# Patient Record
Sex: Male | Born: 1945 | Race: White | Hispanic: No | Marital: Married | Smoking: Former smoker
Health system: Southern US, Community
[De-identification: ages and names within clinical notes are randomized; demographics above are authoritative.]

## PROBLEM LIST (undated history)

## (undated) DIAGNOSIS — I739 Peripheral vascular disease, unspecified: Secondary | ICD-10-CM

## (undated) DIAGNOSIS — A0472 Enterocolitis due to Clostridium difficile, not specified as recurrent: Secondary | ICD-10-CM

## (undated) DIAGNOSIS — E1151 Type 2 diabetes mellitus with diabetic peripheral angiopathy without gangrene: Secondary | ICD-10-CM

## (undated) DIAGNOSIS — D649 Anemia, unspecified: Secondary | ICD-10-CM

## (undated) DIAGNOSIS — F1010 Alcohol abuse, uncomplicated: Secondary | ICD-10-CM

## (undated) DIAGNOSIS — I219 Acute myocardial infarction, unspecified: Secondary | ICD-10-CM

## (undated) DIAGNOSIS — I639 Cerebral infarction, unspecified: Secondary | ICD-10-CM

## (undated) DIAGNOSIS — F0390 Unspecified dementia without behavioral disturbance: Secondary | ICD-10-CM

## (undated) DIAGNOSIS — M1990 Unspecified osteoarthritis, unspecified site: Secondary | ICD-10-CM

## (undated) HISTORY — DX: Cerebral infarction, unspecified: I63.9

## (undated) HISTORY — DX: Acute myocardial infarction, unspecified: I21.9

## (undated) HISTORY — PX: CORONARY STENT PLACEMENT: SHX1402

## (undated) HISTORY — DX: Anemia, unspecified: D64.9

## (undated) HISTORY — DX: Unspecified osteoarthritis, unspecified site: M19.90

## (undated) HISTORY — PX: BRAIN SURGERY: SHX531

## (undated) HISTORY — DX: Peripheral vascular disease, unspecified: I73.9

## (undated) HISTORY — DX: Type 2 diabetes mellitus with diabetic peripheral angiopathy without gangrene: E11.51

---

## 2013-06-24 HISTORY — PX: COLONOSCOPY: SHX174

## 2016-09-24 ENCOUNTER — Ambulatory Visit
Admission: RE | Admit: 2016-09-24 | Discharge: 2016-09-24 | Disposition: A | Discharge status: Home / Self Care | Payer: Medicare (Managed Care) | Source: Ambulatory Visit | Attending: Internal Medicine | Admitting: Internal Medicine

## 2016-09-24 ENCOUNTER — Other Ambulatory Visit: Payer: Self-pay | Admitting: Internal Medicine

## 2016-09-24 DIAGNOSIS — M25532 Pain in left wrist: Secondary | ICD-10-CM

## 2016-09-26 ENCOUNTER — Other Ambulatory Visit: Payer: Self-pay | Admitting: *Deleted

## 2016-09-26 DIAGNOSIS — I739 Peripheral vascular disease, unspecified: Secondary | ICD-10-CM

## 2016-09-27 ENCOUNTER — Encounter (HOSPITAL_BASED_OUTPATIENT_CLINIC_OR_DEPARTMENT_OTHER): Payer: Medicare (Managed Care) | Attending: Nurse Practitioner

## 2016-09-27 ENCOUNTER — Other Ambulatory Visit (HOSPITAL_COMMUNITY)
Admit: 2016-09-27 | Discharge: 2016-09-27 | Disposition: A | Discharge status: Home / Self Care | Payer: Medicare (Managed Care) | Source: Ambulatory Visit | Attending: Internal Medicine | Admitting: Internal Medicine

## 2016-09-27 DIAGNOSIS — I10 Essential (primary) hypertension: Secondary | ICD-10-CM | POA: Insufficient documentation

## 2016-09-27 DIAGNOSIS — E1151 Type 2 diabetes mellitus with diabetic peripheral angiopathy without gangrene: Secondary | ICD-10-CM | POA: Insufficient documentation

## 2016-09-27 DIAGNOSIS — F0390 Unspecified dementia without behavioral disturbance: Secondary | ICD-10-CM | POA: Insufficient documentation

## 2016-09-27 DIAGNOSIS — L97512 Non-pressure chronic ulcer of other part of right foot with fat layer exposed: Secondary | ICD-10-CM | POA: Insufficient documentation

## 2016-09-27 DIAGNOSIS — E1140 Type 2 diabetes mellitus with diabetic neuropathy, unspecified: Secondary | ICD-10-CM | POA: Diagnosis not present

## 2016-09-27 DIAGNOSIS — E11621 Type 2 diabetes mellitus with foot ulcer: Secondary | ICD-10-CM | POA: Diagnosis not present

## 2016-09-27 DIAGNOSIS — Z794 Long term (current) use of insulin: Secondary | ICD-10-CM | POA: Diagnosis not present

## 2016-09-27 DIAGNOSIS — Z87891 Personal history of nicotine dependence: Secondary | ICD-10-CM | POA: Insufficient documentation

## 2016-09-27 DIAGNOSIS — Z89512 Acquired absence of left leg below knee: Secondary | ICD-10-CM | POA: Insufficient documentation

## 2016-09-27 DIAGNOSIS — Z89431 Acquired absence of right foot: Secondary | ICD-10-CM | POA: Diagnosis not present

## 2016-09-27 DIAGNOSIS — Z8673 Personal history of transient ischemic attack (TIA), and cerebral infarction without residual deficits: Secondary | ICD-10-CM | POA: Diagnosis not present

## 2016-09-27 DIAGNOSIS — I2510 Atherosclerotic heart disease of native coronary artery without angina pectoris: Secondary | ICD-10-CM | POA: Diagnosis not present

## 2016-09-27 DIAGNOSIS — I70201 Unspecified atherosclerosis of native arteries of extremities, right leg: Secondary | ICD-10-CM | POA: Diagnosis not present

## 2016-09-27 DIAGNOSIS — L89893 Pressure ulcer of other site, stage 3: Secondary | ICD-10-CM | POA: Diagnosis not present

## 2016-09-27 DIAGNOSIS — Z955 Presence of coronary angioplasty implant and graft: Secondary | ICD-10-CM | POA: Diagnosis not present

## 2016-09-30 ENCOUNTER — Encounter: Payer: Self-pay | Admitting: Vascular Surgery

## 2016-09-30 LAB — AEROBIC CULTURE  (SUPERFICIAL SPECIMEN)

## 2016-09-30 LAB — AEROBIC CULTURE W GRAM STAIN (SUPERFICIAL SPECIMEN)
Culture: NORMAL
Gram Stain: NONE SEEN

## 2016-10-04 DIAGNOSIS — E11621 Type 2 diabetes mellitus with foot ulcer: Secondary | ICD-10-CM | POA: Diagnosis not present

## 2016-10-09 ENCOUNTER — Encounter: Payer: Self-pay | Admitting: Vascular Surgery

## 2016-10-09 ENCOUNTER — Ambulatory Visit (INDEPENDENT_AMBULATORY_CARE_PROVIDER_SITE_OTHER): Payer: Medicare (Managed Care) | Admitting: Vascular Surgery

## 2016-10-09 ENCOUNTER — Ambulatory Visit (HOSPITAL_COMMUNITY)
Admission: RE | Admit: 2016-10-09 | Discharge: 2016-10-09 | Disposition: A | Discharge status: Home / Self Care | Payer: Medicare (Managed Care) | Source: Ambulatory Visit | Attending: Vascular Surgery | Admitting: Vascular Surgery

## 2016-10-09 VITALS — BP 124/68 | HR 80 | Temp 98.0°F | Resp 28 | Ht 73.0 in | Wt 170.0 lb

## 2016-10-09 DIAGNOSIS — I739 Peripheral vascular disease, unspecified: Secondary | ICD-10-CM

## 2016-10-09 NOTE — Progress Notes (Signed)
Referring Physician: Charissa Bash MD  Patient name: Alan [name] MRN: [id number] DOB: 1945-10-19 Sex: male  REASON FOR CONSULT: Nonhealing wound right foot  HPI: Alan [name] is a 71 y.o. male with known peripheral arterial disease. He previously had a left below-knee amputation done at [facility] approximately 3 months ago. This was done for a nonhealing wound on his left leg. He has also had a previous right transmetatarsal amputation this was several years ago also at [place]. About 3-4 months ago he developed an ulceration on the tip of the transmetatarsal amputation. It has failed to heal despite aggressive local wound care. It is currently being treated with collagenase. He is on aspirin daily. He is not on a statin. The patient denies claudication symptoms but has not really been ambulatory and almost 6 months. He does describe rest pain in the right foot occasionally. He has fairly significant dementia and almost 100% of his care is from his wife. He was able to transfer and walk some on his right leg prior to 6 months ago. The patient's wife denies any prior interventions on his arterial circulation. He is a former smoker quit about 5 months ago. He does have sun downing every evening. Other medical problems include anemia, arthritis, coronary artery disease all of which are stable.  Past Medical History:  Diagnosis Date  . Anemia   . Arthritis   . Diabetes mellitus without complication (HCC)   . Myocardial infarction (HCC)   . Peripheral arterial disease (HCC)   . Stroke [facility])     Past surgical history:  Previous colostomy and colostomy takedown, right transmetatarsal amputation, left below-knee amputation, coronary stenting  Family History  Problem Relation Age of Onset  . Diabetes Mother   . Heart disease Father   . Heart disease Brother     SOCIAL HISTORY: Social History   Social History  . Marital status: Married    Spouse name: N/A  . Number of  children: N/A  . Years of education: N/A   Occupational History  . Not on file.   Social History Main Topics  . Smoking status: Not on file  . Smokeless tobacco: Not on file  . Alcohol use Not on file  . Drug use: Unknown  . Sexual activity: Not on file   Other Topics Concern  . Not on file   Social History Narrative  . No narrative on file  He is a former smoker. He quit 5 months ago.  Allergies  Allergen Reactions  . Penicillins Rash    Current Outpatient Prescriptions  Medication Sig Dispense Refill  . acetaminophen (TYLENOL) 650 MG CR tablet Take 650 mg by mouth every 8 (eight) hours as needed for pain.    Marland Kitchen alendronate (FOSAMAX) 70 MG tablet Take by mouth.    . ALPRAZolam (XANAX) 1 MG tablet TAKE 1 TABLET TWICE DAILY AS NEEDED FOR AGITATION    . aspirin 81 MG EC tablet Take by mouth.    . collagenase (SANTYL) ointment Apply 1 application topically daily.    . ferrous sulfate 325 (65 FE) MG tablet Take by mouth.    . fluticasone (FLONASE) 50 MCG/ACT nasal spray Place into the nose.    . insulin glargine (LANTUS) 100 UNIT/ML injection Inject into the skin.    Marland Kitchen meclizine (ANTIVERT) 25 MG tablet TAKE ONE TABLET (25 MG TOTAL) BY MOUTH 3 (THREE) TIMES A DAY AS NEEDED.    . metFORMIN (GLUCOPHAGE-XR) 750 MG 24 hr tablet TAKE  ONE TAB WITH BREAKFAST AND ONE TAB WITH SUPPER    . sennosides-docusate sodium (SENOKOT-S) 8.6-50 MG tablet Take 1 tablet by mouth daily.    . sertraline (ZOLOFT) 50 MG tablet Take by mouth.    . traZODone (DESYREL) 50 MG tablet TAKE ONE TABLET (50 MG TOTAL) BY MOUTH AT BEDTIME.  2   No current facility-administered medications for this visit.     ROS:   General:  No weight loss, Fever, chills  HEENT: No recent headaches, no nasal bleeding, no visual changes, no sore throat  Neurologic: No dizziness, blackouts, seizures. No recent symptoms of stroke or mini- stroke. No recent episodes of slurred speech, or temporary blindness.  Cardiac: No recent  episodes of chest pain/pressure, no shortness of breath at rest.  + shortness of breath with exertion.  Denies history of atrial fibrillation or irregular heartbeat  Vascular: + history of rest pain in feet.  No history of claudication.  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 He also currently has a broken left wrist.  Hematologic:No history of hypercoagulable state.  No history of easy bleeding.  No history of anemia  Gastrointestinal: No hematochezia or melena,  No gastroesophageal reflux, no trouble swallowing  Urinary:  chronic Kidney disease,  on HD -  MWF or  TTHS,  Burning with urination,  Frequent urination,  Difficulty urinating;   Skin: No rashes  Psychological: No history of anxiety,  No history of depression   Physical Examination  Vitals:   10/09/16 1205  BP: 124/68  Pulse: 80  Resp: (!) 28  Temp: 98 F (36.7 C)  TempSrc: Oral  SpO2: 93%  Weight: 170 lb (77.1 kg)  Height:  (1.854 m)    Body mass index is 22.43 kg/m.  General:  Alert and oriented, no acute distress HEENT: Normal Neck: No bruit or JVD Pulmonary: Clear to auscultation bilaterally Cardiac: Regular Rate and Rhythm without murmur Abdomen: Soft, non-tender, non-distended Skin: No rash, 3 cm less than 1 mm depth ulceration with some granulation tissue proximal front aspect of right transmetatarsal amputation Extremity Pulses:  1+ left difficult to palpate right femoral, absent right dorsalis pedis, posterior tibial pulses  Musculoskeletal: No deformity or edema  Neurologic: Right Upper and bilateral lower extremity motor 5/5 and symmetric  DATA:  Patient had a right leg ABI today which was monophasic and 0.72  ASSESSMENT:  Patient with  peripheral arterial disease now with nonhealing wound on the right foot at risk for limb loss. He overall is fairly debilitated. Had lengthy discussion with the patient and his wife today that we would do an arteriogram lower extremity runoff to see if there is a possible percutaneous intervention. However I do not believe he is a candidate for an open operation if he is not a candidate for percutaneous procedure. Otherwise the plan would be continued wound care with an amputation only if this fails long-term.   PLAN:  See above. The patient will be scheduled for aortogram lower extremity runoff either this Friday or next Friday depending on scheduling the wife will call us back regarding this.   Fabienne Bruns, MD [facility] [place] Office: [phone] Pager: [phone]

## 2016-10-11 ENCOUNTER — Other Ambulatory Visit: Payer: Self-pay

## 2016-10-11 DIAGNOSIS — E11621 Type 2 diabetes mellitus with foot ulcer: Secondary | ICD-10-CM | POA: Diagnosis not present

## 2016-10-18 DIAGNOSIS — E11621 Type 2 diabetes mellitus with foot ulcer: Secondary | ICD-10-CM | POA: Diagnosis not present

## 2016-10-25 ENCOUNTER — Ambulatory Visit (HOSPITAL_COMMUNITY)
Admission: RE | Admit: 2016-10-25 | Discharge: 2016-10-25 | Disposition: A | Discharge status: Home / Self Care | Payer: Medicare (Managed Care) | Source: Ambulatory Visit | Attending: Vascular Surgery | Admitting: Vascular Surgery

## 2016-10-25 ENCOUNTER — Other Ambulatory Visit: Payer: Self-pay

## 2016-10-25 ENCOUNTER — Encounter (HOSPITAL_COMMUNITY)
Admission: RE | Disposition: A | Discharge status: Home / Self Care | Payer: Self-pay | Source: Ambulatory Visit | Attending: Vascular Surgery

## 2016-10-25 DIAGNOSIS — Z7982 Long term (current) use of aspirin: Secondary | ICD-10-CM | POA: Diagnosis not present

## 2016-10-25 DIAGNOSIS — E1151 Type 2 diabetes mellitus with diabetic peripheral angiopathy without gangrene: Secondary | ICD-10-CM | POA: Insufficient documentation

## 2016-10-25 DIAGNOSIS — D649 Anemia, unspecified: Secondary | ICD-10-CM | POA: Insufficient documentation

## 2016-10-25 DIAGNOSIS — Z89512 Acquired absence of left leg below knee: Secondary | ICD-10-CM | POA: Diagnosis not present

## 2016-10-25 DIAGNOSIS — Z8673 Personal history of transient ischemic attack (TIA), and cerebral infarction without residual deficits: Secondary | ICD-10-CM | POA: Insufficient documentation

## 2016-10-25 DIAGNOSIS — Z955 Presence of coronary angioplasty implant and graft: Secondary | ICD-10-CM | POA: Insufficient documentation

## 2016-10-25 DIAGNOSIS — I252 Old myocardial infarction: Secondary | ICD-10-CM | POA: Insufficient documentation

## 2016-10-25 DIAGNOSIS — Z89431 Acquired absence of right foot: Secondary | ICD-10-CM | POA: Diagnosis not present

## 2016-10-25 DIAGNOSIS — Z8249 Family history of ischemic heart disease and other diseases of the circulatory system: Secondary | ICD-10-CM | POA: Diagnosis not present

## 2016-10-25 DIAGNOSIS — Z880 Allergy status to penicillin: Secondary | ICD-10-CM | POA: Diagnosis not present

## 2016-10-25 DIAGNOSIS — Z794 Long term (current) use of insulin: Secondary | ICD-10-CM | POA: Insufficient documentation

## 2016-10-25 DIAGNOSIS — I70235 Atherosclerosis of native arteries of right leg with ulceration of other part of foot: Secondary | ICD-10-CM | POA: Insufficient documentation

## 2016-10-25 DIAGNOSIS — Z87891 Personal history of nicotine dependence: Secondary | ICD-10-CM | POA: Insufficient documentation

## 2016-10-25 DIAGNOSIS — L97519 Non-pressure chronic ulcer of other part of right foot with unspecified severity: Secondary | ICD-10-CM | POA: Insufficient documentation

## 2016-10-25 DIAGNOSIS — I2510 Atherosclerotic heart disease of native coronary artery without angina pectoris: Secondary | ICD-10-CM | POA: Insufficient documentation

## 2016-10-25 DIAGNOSIS — Z7951 Long term (current) use of inhaled steroids: Secondary | ICD-10-CM | POA: Insufficient documentation

## 2016-10-25 DIAGNOSIS — F0390 Unspecified dementia without behavioral disturbance: Secondary | ICD-10-CM | POA: Insufficient documentation

## 2016-10-25 DIAGNOSIS — I70234 Atherosclerosis of native arteries of right leg with ulceration of heel and midfoot: Secondary | ICD-10-CM | POA: Diagnosis not present

## 2016-10-25 DIAGNOSIS — M1990 Unspecified osteoarthritis, unspecified site: Secondary | ICD-10-CM | POA: Insufficient documentation

## 2016-10-25 HISTORY — PX: LOWER EXTREMITY ANGIOGRAPHY: CATH118251

## 2016-10-25 HISTORY — PX: ABDOMINAL AORTOGRAM: CATH118222

## 2016-10-25 LAB — POCT I-STAT, CHEM 8
BUN: 10 mg/dL (ref 6–20)
CHLORIDE: 100 mmol/L — AB (ref 101–111)
Calcium, Ion: 1.15 mmol/L (ref 1.15–1.40)
Creatinine, Ser: 0.5 mg/dL — ABNORMAL LOW (ref 0.61–1.24)
GLUCOSE: 198 mg/dL — AB (ref 65–99)
HEMATOCRIT: 37 % — AB (ref 39.0–52.0)
HEMOGLOBIN: 12.6 g/dL — AB (ref 13.0–17.0)
POTASSIUM: 4.2 mmol/L (ref 3.5–5.1)
Sodium: 140 mmol/L (ref 135–145)
TCO2: 31 mmol/L (ref 0–100)

## 2016-10-25 LAB — GLUCOSE, CAPILLARY
GLUCOSE-CAPILLARY: 214 mg/dL — AB (ref 65–99)
GLUCOSE-CAPILLARY: 110 mg/dL — AB (ref 65–99)

## 2016-10-25 SURGERY — LOWER EXTREMITY ANGIOGRAPHY
Anesthesia: LOCAL | Laterality: Right

## 2016-10-25 MED ORDER — FENTANYL CITRATE (PF) 100 MCG/2ML IJ SOLN
INTRAMUSCULAR | Status: DC | PRN
  Administered 2016-10-25: 25 ug via INTRAVENOUS

## 2016-10-25 MED ORDER — LIDOCAINE HCL (PF) 1 % IJ SOLN
INTRAMUSCULAR | Status: DC | PRN
  Administered 2016-10-25: 15 mL

## 2016-10-25 MED ORDER — HEPARIN (PORCINE) IN NACL 2-0.9 UNIT/ML-% IJ SOLN
INTRAMUSCULAR | Status: DC | PRN
  Administered 2016-10-25: 1000 mL

## 2016-10-25 MED ORDER — IODIXANOL 320 MG/ML IV SOLN
INTRAVENOUS | Status: DC | PRN
  Administered 2016-10-25: 120 mL via INTRAVENOUS

## 2016-10-25 MED ORDER — SODIUM CHLORIDE 0.9 % IV SOLN
INTRAVENOUS | Status: DC
  Administered 2016-10-25: 08:00:00 via INTRAVENOUS

## 2016-10-25 MED ORDER — MIDAZOLAM HCL 2 MG/2ML IJ SOLN
INTRAMUSCULAR | Status: DC | PRN
  Administered 2016-10-25: 0.5 mg via INTRAVENOUS

## 2016-10-25 MED ORDER — FENTANYL CITRATE (PF) 100 MCG/2ML IJ SOLN
INTRAMUSCULAR | Status: AC
  Filled 2016-10-25: qty 2

## 2016-10-25 MED ORDER — MIDAZOLAM HCL 2 MG/2ML IJ SOLN
INTRAMUSCULAR | Status: AC
  Filled 2016-10-25: qty 2

## 2016-10-25 MED ORDER — SODIUM CHLORIDE 0.45 % IV SOLN
INTRAVENOUS | Status: DC
  Administered 2016-10-25: 12:00:00 via INTRAVENOUS

## 2016-10-25 MED ORDER — LIDOCAINE HCL 1 % IJ SOLN
INTRAMUSCULAR | Status: AC
  Filled 2016-10-25: qty 20

## 2016-10-25 SURGICAL SUPPLY — 7 items
CATH OMNI FLUSH 5F 65CM (CATHETERS) ×3 IMPLANT
KIT PV (KITS) ×3 IMPLANT
SHEATH PINNACLE 5F 10CM (SHEATH) ×3 IMPLANT
SYR MEDRAD MARK V 150ML (SYRINGE) ×3 IMPLANT
TRANSDUCER W/STOPCOCK (MISCELLANEOUS) ×3 IMPLANT
TRAY PV CATH (CUSTOM PROCEDURE TRAY) ×3 IMPLANT
WIRE HITORQ VERSACORE ST 145CM (WIRE) ×3 IMPLANT

## 2016-10-25 NOTE — Interval H&P Note (Signed)
History and Physical Interval Note:  10/25/2016 11:05 AM  Alan [name]  has presented today for surgery, with the diagnosis of pvd with nonhealing wound on right foot  The various methods of treatment have been discussed with the patient and family. After consideration of risks, benefits and other options for treatment, the patient has consented to  Procedure(s): Lower Extremity Angiography (Right) Abdominal Aortogram (N/A) as a surgical intervention .  The patient's history has been reviewed, patient examined, no change in status, stable for surgery.  I have reviewed the patient's chart and labs.  Questions were answered to the patient's satisfaction.     Fabienne BrunsFields, Maryssa Giampietro

## 2016-10-25 NOTE — Discharge Instructions (Signed)
HOLD METFORMIN FOR 48 HRS.  MAY RESUME MON MORNING.   Angiogram, Care After This sheet gives you information about how to care for yourself after your procedure. Your doctor may also give you more specific instructions. If you have problems or questions, contact your doctor. Follow these instructions at home: Insertion site care   Follow instructions from your doctor about how to take care of your long, thin tube (catheter) insertion area. Make sure you:  Wash your hands with soap and water before you change your bandage (dressing). If you cannot use soap and water, use hand sanitizer.  Change your bandage as told by your doctor.  Leave stitches (sutures), skin glue, or skin tape (adhesive) strips in place. They may need to stay in place for 2 weeks or longer. If tape strips get loose and curl up, you may trim the loose edges. Do not remove tape strips completely unless your doctor says it is okay.  Do not take baths, swim, or use a hot tub until your doctor says it is okay.  You may shower 24-48 hours after the procedure or as told by your doctor.  Gently wash the area with plain soap and water.  Pat the area dry with a clean towel.  Do not rub the area. This may cause bleeding.  Do not apply powder or lotion to the area. Keep the area clean and dry.  Check your insertion area every day for signs of infection. Check for:  More redness, swelling, or pain.  Fluid or blood.  Warmth.  Pus or a bad smell. Activity   Rest as told by your doctor, usually for 1-2 days.  Do not lift anything that is heavier than 10 lbs. (4.5 kg) or as told by your doctor.  Do not drive for 24 hours if you were given a medicine to help you relax (sedative).  Do not drive or use heavy machinery while taking prescription pain medicine. General instructions   Go back to your normal activities as told by your doctor, usually in about a week. Ask your doctor what activities are safe for  you.  If the insertion area starts to bleed, lie flat and put pressure on the area. If the bleeding does not stop, get help right away. This is an emergency.  Drink enough fluid to keep your pee (urine) clear or pale yellow.  Take over-the-counter and prescription medicines only as told by your doctor.  Keep all follow-up visits as told by your doctor. This is important. Contact a doctor if:  You have a fever.  You have chills.  You have more redness, swelling, or pain around your insertion area.  You have fluid or blood coming from your insertion area.  The insertion area feels warm to the touch.  You have pus or a bad smell coming from your insertion area.  You have more bruising around the insertion area.  Blood collects in the tissue around the insertion area (hematoma) that may be painful to the touch. Get help right away if:  You have a lot of pain in the insertion area.  The insertion area swells very fast.  The insertion area is bleeding, and the bleeding does not stop after holding steady pressure on the area.  The area near or just beyond the insertion area becomes pale, cool, tingly, or numb. These symptoms may be an emergency. Do not wait to see if the symptoms will go away. Get medical help right away. Call  your local emergency services (911 in the [place] Do not drive yourself to the hospital. Summary  After the procedure, it is common to have bruising and tenderness at the long, thin tube insertion area.  After the procedure, it is important to rest and drink plenty of fluids.  Do not take baths, swim, or use a hot tub until your doctor says it is okay to do so. You may shower 24-48 hours after the procedure or as told by your doctor.  If the insertion area starts to bleed, lie flat and put pressure on the area. If the bleeding does not stop, get help right away. This is an emergency. This information is not intended to replace advice given to you by your  health care provider. Make sure you discuss any questions you have with your health care provider. Document Released: 09/06/2008 Document Revised: 06/04/2016 Document Reviewed: 06/04/2016 Elsevier Interactive Patient Education  2017 [employer]

## 2016-10-25 NOTE — Op Note (Signed)
Procedure: Abdominal aortogram with bilateral lower extremity runoff  Preoperative diagnosis: Nonhealing wound right foot.  Postoperative diagnosis: Same  Anesthesia: Local with IV sedation  Operative findings: #1 subtotal occlusion right common femoral artery, one vessel runoff peroneal artery patent right superficial femoral-popliteal artery  Operative details: After obtaining informed consent, patient taken PV lab. The patient was placed in supine position the Angio table. Both groins were prepped and draped in usual sterile fashion. Local anesthesia was treated of the left common femoral artery. Using ultrasound guidance the left common femoral artery was successfully cannulated and 035 versacore wire threaded up in the abdominal aorta under fluoroscopic guidance. Next a 5 French sheath was placed over the guidewire and the left common femoral artery. This was thoroughly flushed with heparin saline. I [place] Omni Flush catheter was advanced over the guidewire into the abdominal aorta and abdominal aortogram obtained in AP projection. The left and right renal arteries are patent. The infrarenal abdominal aorta is patent. The left and right common external and internal iliac arteries are patent. Next the Omni Flush catheter was pulled down just above the aortic bifurcation and a pelvic Angiogram was obtained which confirmed the above findings. At this point bilateral lower extremity runoff views were obtained through the Omni Flush catheter.  In the right lower extremity, the right common femoral artery has a subtotal occlusion with calcific plaque. The profunda and superficial femoral arteries are patent. The right popliteal artery is patent. Anterior tibial and posterior tibial arteries are occluded. There is one-vessel runoff via the peroneal artery to the right foot.  In the left lower extremity, there is a pre-existing left below-knee amputation.. The left common femoral and superficial femoral  artery is patent. The left profunda femoris artery is patent. At this point the Omni Flush catheter was pulled back over a guidewire and the 5 [place] sheath thoroughly flushed with heparin saline. The 5 French sheath was then removed and hemostasis obtained with direct pressure.  Operative management: The patient will be scheduled for a right common femoral endarterectomy next week.  Alan Brunsharles Drenda Sobecki, MD [facility] [place] Office: [phone] Pager: [phone]

## 2016-10-25 NOTE — H&P (View-Only) (Signed)
  Referring Physician: Julie Williams MD  Patient name: Alan [name] MRN: [id number] DOB: 12/30/1945 Sex: male  REASON FOR CONSULT: Nonhealing wound right foot  HPI: Alan [name] is a 70 y.o. male with known peripheral arterial disease. He previously had a left below-knee amputation done at [facility] approximately 3 months ago. This was done for a nonhealing wound on his left leg. He has also had a previous right transmetatarsal amputation this was several years ago also at [facility]. About 3-4 months ago he developed an ulceration on the tip of the transmetatarsal amputation. It has failed to heal despite aggressive local wound care. It is currently being treated with collagenase. He is on aspirin daily. He is not on a statin. The patient denies claudication symptoms but has not really been ambulatory and almost 6 months. He does describe rest pain in the right foot occasionally. He has fairly significant dementia and almost 100% of his care is from his wife. He was able to transfer and walk some on his right leg prior to 6 months ago. The patient's wife denies any prior interventions on his arterial circulation. He is a former smoker quit about 5 months ago. He does have sun downing every evening. Other medical problems include anemia, arthritis, coronary artery disease all of which are stable.  Past Medical History:  Diagnosis Date  . Anemia   . Arthritis   . Diabetes mellitus without complication (HCC)   . Myocardial infarction (HCC)   . Peripheral arterial disease (HCC)   . Stroke (HCC)     Past surgical history:  Previous colostomy and colostomy takedown, right transmetatarsal amputation, left below-knee amputation, coronary stenting  Family History  Problem Relation Age of Onset  . Diabetes Mother   . Heart disease Father   . Heart disease Brother     SOCIAL HISTORY: Social History   Social History  . Marital status: Married    Spouse name: N/A  . Number of  children: N/A  . Years of education: N/A   Occupational History  . Not on file.   Social History Main Topics  . Smoking status: Not on file  . Smokeless tobacco: Not on file  . Alcohol use Not on file  . Drug use: Unknown  . Sexual activity: Not on file   Other Topics Concern  . Not on file   Social History Narrative  . No narrative on file  He is a former smoker. He quit 5 months ago.  Allergies  Allergen Reactions  . Penicillins Rash    Current Outpatient Prescriptions  Medication Sig Dispense Refill  . acetaminophen (TYLENOL) 650 MG CR tablet Take 650 mg by mouth every 8 (eight) hours as needed for pain.    . alendronate (FOSAMAX) 70 MG tablet Take by mouth.    . ALPRAZolam (XANAX) 1 MG tablet TAKE 1 TABLET TWICE DAILY AS NEEDED FOR AGITATION    . aspirin 81 MG EC tablet Take by mouth.    . collagenase (SANTYL) ointment Apply 1 application topically daily.    . ferrous sulfate 325 (65 FE) MG tablet Take by mouth.    . fluticasone (FLONASE) 50 MCG/ACT nasal spray Place into the nose.    . insulin glargine (LANTUS) 100 UNIT/ML injection Inject into the skin.    . meclizine (ANTIVERT) 25 MG tablet TAKE ONE TABLET (25 MG TOTAL) BY MOUTH 3 (THREE) TIMES A DAY AS NEEDED.    . metFORMIN (GLUCOPHAGE-XR) 750 MG 24 hr tablet TAKE   ONE TAB WITH BREAKFAST AND ONE TAB WITH SUPPER    . sennosides-docusate sodium (SENOKOT-S) 8.6-50 MG tablet Take 1 tablet by mouth daily.    . sertraline (ZOLOFT) 50 MG tablet Take by mouth.    . traZODone (DESYREL) 50 MG tablet TAKE ONE TABLET (50 MG TOTAL) BY MOUTH AT BEDTIME.  2   No current facility-administered medications for this visit.     ROS:   General:  No weight loss, Fever, chills  HEENT: No recent headaches, no nasal bleeding, no visual changes, no sore throat  Neurologic: No dizziness, blackouts, seizures. No recent symptoms of stroke or mini- stroke. No recent episodes of slurred speech, or temporary blindness.  Cardiac: No recent  episodes of chest pain/pressure, no shortness of breath at rest.  + shortness of breath with exertion.  Denies history of atrial fibrillation or irregular heartbeat  Vascular: + history of rest pain in feet.  No history of claudication.  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 He also currently has a broken left wrist.  Hematologic:No history of hypercoagulable state.  No history of easy bleeding.  No history of anemia  Gastrointestinal: No hematochezia or melena,  No gastroesophageal reflux, no trouble swallowing  Urinary: [ ]  chronic Kidney disease, [ ]  on HD - [ ]  MWF or [ ]  TTHS, [ ]  Burning with urination, [ ]  Frequent urination, [ ]  Difficulty urinating;   Skin: No rashes  Psychological: No history of anxiety,  No history of depression   Physical Examination  Vitals:   10/09/16 1205  BP: 124/68  Pulse: 80  Resp: (!) 28  Temp: 98 F (36.7 C)  TempSrc: Oral  SpO2: 93%  Weight: 170 lb (77.1 kg)  Height: 6\' 1"  (1.854 m)    Body mass index is 22.43 kg/m.  General:  Alert and oriented, no acute distress HEENT: Normal Neck: No bruit or JVD Pulmonary: Clear to auscultation bilaterally Cardiac: Regular Rate and Rhythm without murmur Abdomen: Soft, non-tender, non-distended Skin: No rash, 3 cm less than 1 mm depth ulceration with some granulation tissue proximal front aspect of right transmetatarsal amputation Extremity Pulses:  1+ left difficult to palpate right femoral, absent right dorsalis pedis, posterior tibial pulses  Musculoskeletal: No deformity or edema  Neurologic: Right Upper and bilateral lower extremity motor 5/5 and symmetric  DATA:  Patient had a right leg ABI today which was monophasic and 0.72  ASSESSMENT:  Patient with  peripheral arterial disease now with nonhealing wound on the right foot at risk for limb loss. He overall is fairly debilitated. Had lengthy discussion with the patient and his wife today that we would do an arteriogram lower extremity runoff to see if there is a possible percutaneous intervention. However I do not believe he is a candidate for an open operation if he is not a candidate for percutaneous procedure. Otherwise the plan would be continued wound care with an amputation only if this fails long-term.   PLAN:  See above. The patient will be scheduled for aortogram lower extremity runoff either this Friday or next Friday depending on scheduling the wife will call us back regarding this.   Fabienne Brunsharles Fields, MD [facility] [place] Office: [phone] Pager: [phone]

## 2016-10-28 ENCOUNTER — Encounter (HOSPITAL_COMMUNITY): Payer: Self-pay | Admitting: Vascular Surgery

## 2016-10-29 ENCOUNTER — Telehealth: Payer: Self-pay | Admitting: Vascular Surgery

## 2016-10-29 ENCOUNTER — Encounter (HOSPITAL_COMMUNITY): Payer: Self-pay | Admitting: *Deleted

## 2016-10-29 NOTE — Telephone Encounter (Signed)
Spoke w/ Celine MansSonja at [facility]. I forwarded her call to the scheduling nurse for more specific instructions regarding the procedure. awt

## 2016-10-29 NOTE — Telephone Encounter (Signed)
-----   Message from Sharee PimpleMarilyn K McChesney, RN sent at 10/29/2016  9:38 AM EDT ----- Regarding: RE: Appointment Question Contact: [phone] He is scheduled for surgery tomorrow with Dr. Darrick PennaFields. ----- Message ----- From: Shari Prowsobin, Annette W Sent: 10/29/2016   9:11 AM To: [facility] Clinical Pool Subject: Appointment Question                           Celine MansSonja from [facility] called yesterday to schedule a post op appointment for this patient. I do see in his chart that he had a procedure on 10/25/16 but I don't see any instructions in the discharge summary or a staff message indicating when he should be seen again. The d/c note did mention he would be scheduled for another procedure this week so maybe that is why she is calling.  Please advise. Thanks J. C. Penneynnette

## 2016-10-29 NOTE — Telephone Encounter (Signed)
-----   Message from Phillips Odorarol S Pullins, RN sent at 10/29/2016  9:41 AM EDT ----- Regarding: RE: Appointment Question Contact: [phone] This pt. Is scheduled for right femoral endarterectomy and right foot debridement tomorrow, 5/9.  I left a message with Sonja @ [facility] re: pt. [facility].   ----- Message ----- From: Shari Prowsobin, Annette W Sent: 10/29/2016   9:11 AM To: [facility] Clinical Pool Subject: Appointment Question                           Celine MansSonja from [facility] called yesterday to schedule a post op appointment for this patient. I do see in his chart that he had a procedure on 10/25/16 but I don't see any instructions in the discharge summary or a staff message indicating when he should be seen again. The d/c note did mention he would be scheduled for another procedure this week so maybe that is why she is calling.  Please advise. Thanks J. C. Penneynnette

## 2016-10-29 NOTE — Telephone Encounter (Signed)
I left message for [facility] Inconja @ [facility] regarding this patient's procedure R femoral endarterectomy and R foot debrid[facility] and patient needs to arrive at 9am. I also asked Sonja to call our scheduling nurses back if she had any further questions. awt

## 2016-10-29 NOTE — Progress Notes (Signed)
Pt gave verbal consent for spouse, Lyla SonCarrie, to complete SDW-Pre-op call. Spouse denies that pt C/O SOB and chest pain. Spouse denies that pt is under the care of a cardiologist. Spouse stated that pt had an echo > 10 years ago and a stress test > 15 years ago. Spouse stated that pt had " one heart stent put in 25 years ago."  Spouse stated that MD advised that pt take half dose Lantus insulin tonight and no Metformin DOS. Spouse made aware to have pt check BG every 2 hours prior to arrival, treat a BG< 70 with 4 glucose tabs, wait 15 minutes after taking tabs to recheck BG, if BG remains < 70 call SS unit. Spouse made aware to have pt stop taking otc vitamins, fish oil and herbal medications. Do not take any NSAIDs ie: Ibuprofen, Advil, Naproxen ,BC and Goody Powder. Spouse verbalized understanding of all pre-op instructions.

## 2016-10-30 ENCOUNTER — Inpatient Hospital Stay (HOSPITAL_COMMUNITY): Payer: Medicare (Managed Care) | Admitting: Certified Registered Nurse Anesthetist

## 2016-10-30 ENCOUNTER — Encounter (HOSPITAL_COMMUNITY)
Admission: RE | Disposition: A | Discharge status: Home / Self Care | Payer: Self-pay | Source: Ambulatory Visit | Attending: Vascular Surgery

## 2016-10-30 ENCOUNTER — Encounter (HOSPITAL_COMMUNITY): Payer: Self-pay | Admitting: *Deleted

## 2016-10-30 ENCOUNTER — Inpatient Hospital Stay (HOSPITAL_COMMUNITY)
Admission: RE | Admit: 2016-10-30 | Discharge: 2016-10-31 | DRG: 254 | Disposition: A | Discharge status: Home / Self Care | Payer: Medicare (Managed Care) | Source: Ambulatory Visit | Attending: Vascular Surgery | Admitting: Vascular Surgery

## 2016-10-30 DIAGNOSIS — F0390 Unspecified dementia without behavioral disturbance: Secondary | ICD-10-CM | POA: Diagnosis present

## 2016-10-30 DIAGNOSIS — Z7983 Long term (current) use of bisphosphonates: Secondary | ICD-10-CM

## 2016-10-30 DIAGNOSIS — Z8673 Personal history of transient ischemic attack (TIA), and cerebral infarction without residual deficits: Secondary | ICD-10-CM

## 2016-10-30 DIAGNOSIS — I70234 Atherosclerosis of native arteries of right leg with ulceration of heel and midfoot: Secondary | ICD-10-CM | POA: Diagnosis not present

## 2016-10-30 DIAGNOSIS — Z89512 Acquired absence of left leg below knee: Secondary | ICD-10-CM | POA: Diagnosis not present

## 2016-10-30 DIAGNOSIS — Z87891 Personal history of nicotine dependence: Secondary | ICD-10-CM

## 2016-10-30 DIAGNOSIS — Z955 Presence of coronary angioplasty implant and graft: Secondary | ICD-10-CM | POA: Diagnosis not present

## 2016-10-30 DIAGNOSIS — Z794 Long term (current) use of insulin: Secondary | ICD-10-CM

## 2016-10-30 DIAGNOSIS — Z7951 Long term (current) use of inhaled steroids: Secondary | ICD-10-CM

## 2016-10-30 DIAGNOSIS — Z8249 Family history of ischemic heart disease and other diseases of the circulatory system: Secondary | ICD-10-CM

## 2016-10-30 DIAGNOSIS — M1990 Unspecified osteoarthritis, unspecified site: Secondary | ICD-10-CM | POA: Diagnosis present

## 2016-10-30 DIAGNOSIS — I70209 Unspecified atherosclerosis of native arteries of extremities, unspecified extremity: Secondary | ICD-10-CM | POA: Diagnosis present

## 2016-10-30 DIAGNOSIS — Z7982 Long term (current) use of aspirin: Secondary | ICD-10-CM

## 2016-10-30 DIAGNOSIS — E1151 Type 2 diabetes mellitus with diabetic peripheral angiopathy without gangrene: Secondary | ICD-10-CM | POA: Diagnosis present

## 2016-10-30 DIAGNOSIS — Z833 Family history of diabetes mellitus: Secondary | ICD-10-CM

## 2016-10-30 DIAGNOSIS — I252 Old myocardial infarction: Secondary | ICD-10-CM | POA: Diagnosis not present

## 2016-10-30 DIAGNOSIS — M79671 Pain in right foot: Secondary | ICD-10-CM | POA: Diagnosis present

## 2016-10-30 DIAGNOSIS — I739 Peripheral vascular disease, unspecified: Secondary | ICD-10-CM

## 2016-10-30 HISTORY — DX: Alcohol abuse, uncomplicated: F10.10

## 2016-10-30 HISTORY — PX: ENDARTERECTOMY FEMORAL: SHX5804

## 2016-10-30 HISTORY — DX: Unspecified dementia, unspecified severity, without behavioral disturbance, psychotic disturbance, mood disturbance, and anxiety: F03.90

## 2016-10-30 LAB — COMPREHENSIVE METABOLIC PANEL
ALT: 19 U/L (ref 17–63)
AST: 19 U/L (ref 15–41)
Albumin: 3.5 g/dL (ref 3.5–5.0)
Alkaline Phosphatase: 76 U/L (ref 38–126)
Anion gap: 9 (ref 5–15)
BUN: 13 mg/dL (ref 6–20)
CO2: 26 mmol/L (ref 22–32)
Calcium: 8.9 mg/dL (ref 8.9–10.3)
Chloride: 102 mmol/L (ref 101–111)
Creatinine, Ser: 0.7 mg/dL (ref 0.61–1.24)
GFR calc Af Amer: >60 mL/min (ref >60)
GFR calc non Af Amer: >60 mL/min (ref >60)
Glucose, Bld: 266 mg/dL — ABNORMAL HIGH (ref 65–99)
Potassium: 4.7 mmol/L (ref 3.5–5.1)
Sodium: 137 mmol/L (ref 135–145)
Total Bilirubin: 0.3 mg/dL (ref 0.3–1.2)
Total Protein: 6.9 g/dL (ref 6.5–8.1)

## 2016-10-30 LAB — GLUCOSE, CAPILLARY
GLUCOSE-CAPILLARY: 200 mg/dL — AB (ref 65–99)
Glucose-Capillary: 270 mg/dL — ABNORMAL HIGH (ref 65–99)
Glucose-Capillary: 255 mg/dL — ABNORMAL HIGH (ref 65–99)
Glucose-Capillary: 224 mg/dL — ABNORMAL HIGH (ref 65–99)
Glucose-Capillary: 195 mg/dL — ABNORMAL HIGH (ref 65–99)

## 2016-10-30 LAB — URINALYSIS, ROUTINE W REFLEX MICROSCOPIC
Bilirubin Urine: NEGATIVE
Glucose, UA: 50 mg/dL — AB
Hgb urine dipstick: NEGATIVE
Ketones, ur: NEGATIVE mg/dL
Leukocytes, UA: NEGATIVE
Nitrite: NEGATIVE
Protein, ur: NEGATIVE mg/dL
Specific Gravity, Urine: 1.016 (ref 1.005–1.030)
pH: 6 (ref 5.0–8.0)

## 2016-10-30 LAB — CBC
HCT: 36.5 % — ABNORMAL LOW (ref 39.0–52.0)
Hemoglobin: 11.8 g/dL — ABNORMAL LOW (ref 13.0–17.0)
MCH: 28.6 pg (ref 26.0–34.0)
MCHC: 32.3 g/dL (ref 30.0–36.0)
MCV: 88.6 fL (ref 78.0–100.0)
Platelets: 184 10*3/uL (ref 150–400)
RBC: 4.12 MIL/uL — ABNORMAL LOW (ref 4.22–5.81)
RDW: 14.1 % (ref 11.5–15.5)
WBC: 8.2 10*3/uL (ref 4.0–10.5)

## 2016-10-30 LAB — SURGICAL PCR SCREEN
MRSA, PCR: POSITIVE — AB
STAPHYLOCOCCUS AUREUS: POSITIVE — AB

## 2016-10-30 LAB — TYPE AND SCREEN
ABO/RH(D): O POS
ANTIBODY SCREEN: NEGATIVE

## 2016-10-30 LAB — PROTIME-INR
INR: 1.03
Prothrombin Time: 13.5 seconds (ref 11.4–15.2)

## 2016-10-30 LAB — APTT: aPTT: 28 seconds (ref 24–36)

## 2016-10-30 LAB — ABO/RH: ABO/RH(D): O POS

## 2016-10-30 SURGERY — ENDARTERECTOMY, FEMORAL
Anesthesia: General | Site: Groin | Laterality: Right

## 2016-10-30 MED ORDER — ONDANSETRON HCL 4 MG/2ML IJ SOLN
INTRAMUSCULAR | Status: DC | PRN
  Administered 2016-10-30: 4 mg via INTRAVENOUS

## 2016-10-30 MED ORDER — ONDANSETRON HCL 4 MG/2ML IJ SOLN
INTRAMUSCULAR | Status: AC
  Filled 2016-10-30: qty 4

## 2016-10-30 MED ORDER — TRAZODONE HCL 50 MG PO TABS: 50.0000 mg | ORAL_TABLET | Freq: Every evening | ORAL | Status: DC | PRN

## 2016-10-30 MED ORDER — FENTANYL CITRATE (PF) 100 MCG/2ML IJ SOLN
INTRAMUSCULAR | Status: DC | PRN
  Administered 2016-10-30: 50 ug via INTRAVENOUS
  Administered 2016-10-30: 100 ug via INTRAVENOUS
  Administered 2016-10-30: 150 ug via INTRAVENOUS

## 2016-10-30 MED ORDER — SERTRALINE HCL 50 MG PO TABS
50.0000 mg | ORAL_TABLET | Freq: Two times a day (BID) | ORAL | Status: DC
  Administered 2016-10-30 – 2016-10-31 (×2): 50 mg via ORAL
  Filled 2016-10-30 (×2): qty 1

## 2016-10-30 MED ORDER — MORPHINE SULFATE (PF) 4 MG/ML IV SOLN
2.0000 mg | INTRAVENOUS | Status: DC | PRN
  Administered 2016-10-30: 2 mg via INTRAVENOUS
  Filled 2016-10-30: qty 1

## 2016-10-30 MED ORDER — PHENYLEPHRINE 40 MCG/ML (10ML) SYRINGE FOR IV PUSH (FOR BLOOD PRESSURE SUPPORT)
PREFILLED_SYRINGE | INTRAVENOUS | Status: AC
  Filled 2016-10-30: qty 20

## 2016-10-30 MED ORDER — ASPIRIN EC 81 MG PO TBEC
81.0000 mg | DELAYED_RELEASE_TABLET | Freq: Every day | ORAL | Status: DC
  Administered 2016-10-30 – 2016-10-31 (×2): 81 mg via ORAL
  Filled 2016-10-30 (×2): qty 1

## 2016-10-30 MED ORDER — PHENYLEPHRINE HCL 10 MG/ML IJ SOLN
INTRAMUSCULAR | Status: DC | PRN
  Administered 2016-10-30 (×4): 80 ug via INTRAVENOUS
  Administered 2016-10-30: 40 ug via INTRAVENOUS

## 2016-10-30 MED ORDER — CHLORHEXIDINE GLUCONATE 4 % EX LIQD: 60.0000 mL | Freq: Once | CUTANEOUS | Status: DC

## 2016-10-30 MED ORDER — HYDROMORPHONE HCL 1 MG/ML IJ SOLN: 0.2500 mg | INTRAMUSCULAR | Status: DC | PRN

## 2016-10-30 MED ORDER — ACETAMINOPHEN 325 MG PO TABS: 325.0000 mg | ORAL_TABLET | ORAL | Status: DC | PRN

## 2016-10-30 MED ORDER — ROCURONIUM BROMIDE 100 MG/10ML IV SOLN
INTRAVENOUS | Status: DC | PRN
  Administered 2016-10-30: 50 mg via INTRAVENOUS

## 2016-10-30 MED ORDER — PROPOFOL 10 MG/ML IV BOLUS
INTRAVENOUS | Status: DC | PRN
  Administered 2016-10-30: 130 mg via INTRAVENOUS

## 2016-10-30 MED ORDER — VANCOMYCIN HCL IN DEXTROSE 1-5 GM/200ML-% IV SOLN
1000.0000 mg | Freq: Two times a day (BID) | INTRAVENOUS | Status: AC
  Administered 2016-10-30 – 2016-10-31 (×2): 1000 mg via INTRAVENOUS
  Filled 2016-10-30 (×2): qty 200

## 2016-10-30 MED ORDER — INSULIN ASPART 100 UNIT/ML ~~LOC~~ SOLN
0.0000 [IU] | Freq: Three times a day (TID) | SUBCUTANEOUS | Status: DC
Start: 2016-10-31 — End: 2016-10-31

## 2016-10-30 MED ORDER — HEPARIN SODIUM (PORCINE) 1000 UNIT/ML IJ SOLN
INTRAMUSCULAR | Status: DC | PRN
  Administered 2016-10-30: 8000 [IU] via INTRAVENOUS

## 2016-10-30 MED ORDER — 0.9 % SODIUM CHLORIDE (POUR BTL) OPTIME
TOPICAL | Status: DC | PRN
  Administered 2016-10-30: 2000 mL

## 2016-10-30 MED ORDER — EPHEDRINE 5 MG/ML INJ
INTRAVENOUS | Status: AC
  Filled 2016-10-30: qty 10

## 2016-10-30 MED ORDER — THROMBIN 20000 UNITS EX SOLR
CUTANEOUS | Status: AC
  Filled 2016-10-30: qty 20000

## 2016-10-30 MED ORDER — LEVOFLOXACIN 750 MG PO TABS
750.0000 mg | ORAL_TABLET | Freq: Every day | ORAL | Status: DC
  Administered 2016-10-31: 750 mg via ORAL
  Filled 2016-10-30: qty 1

## 2016-10-30 MED ORDER — ALUM & MAG HYDROXIDE-SIMETH 200-200-20 MG/5ML PO SUSP: 15.0000 mL | ORAL | Status: DC | PRN

## 2016-10-30 MED ORDER — ONDANSETRON HCL 4 MG/2ML IJ SOLN: 4.0000 mg | Freq: Once | INTRAMUSCULAR | Status: DC | PRN

## 2016-10-30 MED ORDER — INSULIN GLARGINE 100 UNIT/ML ~~LOC~~ SOLN
38.0000 [IU] | Freq: Every day | SUBCUTANEOUS | Status: DC
  Administered 2016-10-30: 38 [IU] via SUBCUTANEOUS
  Filled 2016-10-30 (×2): qty 0.38

## 2016-10-30 MED ORDER — POTASSIUM CHLORIDE CRYS ER 20 MEQ PO TBCR: 20.0000 meq | EXTENDED_RELEASE_TABLET | Freq: Every day | ORAL | Status: DC | PRN

## 2016-10-30 MED ORDER — PHENOL 1.4 % MT LIQD: 1.0000 | OROMUCOSAL | Status: DC | PRN

## 2016-10-30 MED ORDER — SODIUM CHLORIDE 0.9 % IV SOLN: 500.0000 mL | Freq: Once | INTRAVENOUS | Status: DC | PRN

## 2016-10-30 MED ORDER — SODIUM CHLORIDE 0.9 % IV SOLN: INTRAVENOUS | Status: DC

## 2016-10-30 MED ORDER — HYDRALAZINE HCL 20 MG/ML IJ SOLN
5.0000 mg | INTRAMUSCULAR | Status: DC | PRN
Start: 2016-10-30 — End: 2016-10-31

## 2016-10-30 MED ORDER — MUPIROCIN 2 % EX OINT
1.0000 "application " | TOPICAL_OINTMENT | Freq: Once | CUTANEOUS | Status: AC
  Administered 2016-10-30: 1 via TOPICAL
  Filled 2016-10-30: qty 22

## 2016-10-30 MED ORDER — ALPRAZOLAM 0.5 MG PO TABS
1.0000 mg | ORAL_TABLET | Freq: Two times a day (BID) | ORAL | Status: DC | PRN
  Administered 2016-10-30: 1 mg via ORAL
  Filled 2016-10-30: qty 2

## 2016-10-30 MED ORDER — GUAIFENESIN-DM 100-10 MG/5ML PO SYRP: 15.0000 mL | ORAL_SOLUTION | ORAL | Status: DC | PRN

## 2016-10-30 MED ORDER — MEPERIDINE HCL 25 MG/ML IJ SOLN: 6.2500 mg | INTRAMUSCULAR | Status: DC | PRN

## 2016-10-30 MED ORDER — SODIUM CHLORIDE 0.9 % IV SOLN
INTRAVENOUS | Status: DC | PRN
  Administered 2016-10-30: 14:00:00

## 2016-10-30 MED ORDER — DOCUSATE SODIUM 100 MG PO CAPS
100.0000 mg | ORAL_CAPSULE | Freq: Every day | ORAL | Status: DC
Start: 2016-10-31 — End: 2016-10-31
  Administered 2016-10-31: 100 mg via ORAL
  Filled 2016-10-30: qty 1

## 2016-10-30 MED ORDER — LABETALOL HCL 5 MG/ML IV SOLN: 10.0000 mg | INTRAVENOUS | Status: DC | PRN

## 2016-10-30 MED ORDER — PROPOFOL 10 MG/ML IV BOLUS
INTRAVENOUS | Status: AC
  Filled 2016-10-30: qty 20

## 2016-10-30 MED ORDER — ALENDRONATE SODIUM 70 MG PO TABS: 70.0000 mg | ORAL_TABLET | ORAL | Status: DC

## 2016-10-30 MED ORDER — PANTOPRAZOLE SODIUM 40 MG PO TBEC
40.0000 mg | DELAYED_RELEASE_TABLET | Freq: Every day | ORAL | Status: DC
  Administered 2016-10-30 – 2016-10-31 (×2): 40 mg via ORAL
  Filled 2016-10-30 (×2): qty 1

## 2016-10-30 MED ORDER — MIDAZOLAM HCL 2 MG/2ML IJ SOLN
INTRAMUSCULAR | Status: AC
  Filled 2016-10-30: qty 2

## 2016-10-30 MED ORDER — METOPROLOL TARTRATE 5 MG/5ML IV SOLN: 2.0000 mg | INTRAVENOUS | Status: DC | PRN

## 2016-10-30 MED ORDER — SENNOSIDES-DOCUSATE SODIUM 8.6-50 MG PO TABS: 1.0000 | ORAL_TABLET | Freq: Every day | ORAL | Status: DC | PRN

## 2016-10-30 MED ORDER — FENTANYL CITRATE (PF) 250 MCG/5ML IJ SOLN
INTRAMUSCULAR | Status: AC
  Filled 2016-10-30: qty 5

## 2016-10-30 MED ORDER — SODIUM CHLORIDE 0.9 % IV SOLN
INTRAVENOUS | Status: DC
  Administered 2016-10-30: 20:00:00 via INTRAVENOUS

## 2016-10-30 MED ORDER — ROCURONIUM BROMIDE 10 MG/ML (PF) SYRINGE
PREFILLED_SYRINGE | INTRAVENOUS | Status: AC
  Filled 2016-10-30: qty 10

## 2016-10-30 MED ORDER — LIDOCAINE 2% (20 MG/ML) 5 ML SYRINGE
INTRAMUSCULAR | Status: AC
  Filled 2016-10-30: qty 10

## 2016-10-30 MED ORDER — ACETAMINOPHEN 325 MG PO TABS
650.0000 mg | ORAL_TABLET | Freq: Three times a day (TID) | ORAL | Status: DC
  Administered 2016-10-30 – 2016-10-31 (×2): 650 mg via ORAL
  Filled 2016-10-30 (×2): qty 2

## 2016-10-30 MED ORDER — ONDANSETRON HCL 4 MG/2ML IJ SOLN: 4.0000 mg | Freq: Four times a day (QID) | INTRAMUSCULAR | Status: DC | PRN

## 2016-10-30 MED ORDER — ACETAMINOPHEN 650 MG RE SUPP: 325.0000 mg | RECTAL | Status: DC | PRN

## 2016-10-30 MED ORDER — VANCOMYCIN HCL IN DEXTROSE 1-5 GM/200ML-% IV SOLN
INTRAVENOUS | Status: AC
  Filled 2016-10-30: qty 200

## 2016-10-30 MED ORDER — LIDOCAINE HCL (CARDIAC) 20 MG/ML IV SOLN
INTRAVENOUS | Status: DC | PRN
  Administered 2016-10-30: 100 mg via INTRAVENOUS

## 2016-10-30 MED ORDER — METFORMIN HCL ER 750 MG PO TB24
750.0000 mg | ORAL_TABLET | Freq: Every day | ORAL | Status: DC
  Administered 2016-10-31: 750 mg via ORAL
  Filled 2016-10-30: qty 1

## 2016-10-30 MED ORDER — FLUTICASONE PROPIONATE 50 MCG/ACT NA SUSP: 1.0000 | Freq: Every day | NASAL | Status: DC | PRN

## 2016-10-30 MED ORDER — MIDAZOLAM HCL 5 MG/5ML IJ SOLN
INTRAMUSCULAR | Status: DC | PRN
  Administered 2016-10-30: 2 mg via INTRAVENOUS

## 2016-10-30 MED ORDER — ATORVASTATIN CALCIUM 10 MG PO TABS
10.0000 mg | ORAL_TABLET | Freq: Every evening | ORAL | Status: DC
  Administered 2016-10-30: 10 mg via ORAL
  Filled 2016-10-30: qty 1

## 2016-10-30 MED ORDER — SUGAMMADEX SODIUM 200 MG/2ML IV SOLN
INTRAVENOUS | Status: DC | PRN
  Administered 2016-10-30: 200 mg via INTRAVENOUS

## 2016-10-30 MED ORDER — PROTAMINE SULFATE 10 MG/ML IV SOLN
INTRAVENOUS | Status: DC | PRN
  Administered 2016-10-30: 30 mg via INTRAVENOUS
  Administered 2016-10-30: 20 mg via INTRAVENOUS

## 2016-10-30 MED ORDER — TAMSULOSIN HCL 0.4 MG PO CAPS
0.4000 mg | ORAL_CAPSULE | Freq: Every evening | ORAL | Status: DC
  Administered 2016-10-30: 0.4 mg via ORAL
  Filled 2016-10-30: qty 1

## 2016-10-30 MED ORDER — ENOXAPARIN SODIUM 30 MG/0.3ML ~~LOC~~ SOLN: 30.0000 mg | SUBCUTANEOUS | Status: DC

## 2016-10-30 MED ORDER — LACTATED RINGERS IV SOLN
INTRAVENOUS | Status: DC
  Administered 2016-10-30 (×2): via INTRAVENOUS

## 2016-10-30 MED ORDER — VANCOMYCIN HCL IN DEXTROSE 1-5 GM/200ML-% IV SOLN
1000.0000 mg | INTRAVENOUS | Status: AC
  Administered 2016-10-30: 1000 mg via INTRAVENOUS

## 2016-10-30 MED ORDER — OXYCODONE-ACETAMINOPHEN 5-325 MG PO TABS
1.0000 | ORAL_TABLET | ORAL | Status: DC | PRN
  Administered 2016-10-30 – 2016-10-31 (×3): 2 via ORAL
  Filled 2016-10-30 (×3): qty 2

## 2016-10-30 MED ORDER — FERROUS SULFATE 325 (65 FE) MG PO TABS: 325.0000 mg | ORAL_TABLET | Freq: Every day | ORAL | Status: DC

## 2016-10-30 MED ORDER — MAGNESIUM SULFATE 2 GM/50ML IV SOLN: 2.0000 g | Freq: Every day | INTRAVENOUS | Status: DC | PRN

## 2016-10-30 SURGICAL SUPPLY — 49 items
BANDAGE ACE 4X5 VEL STRL LF (GAUZE/BANDAGES/DRESSINGS) IMPLANT
BANDAGE ACE 6X5 VEL STRL LF (GAUZE/BANDAGES/DRESSINGS) IMPLANT
BNDG GAUZE ELAST 4 BULKY (GAUZE/BANDAGES/DRESSINGS) ×3 IMPLANT
CANISTER SUCT 3000ML PPV (MISCELLANEOUS) ×3 IMPLANT
CANNULA VESSEL 3MM 2 BLNT TIP (CANNULA) ×3 IMPLANT
CLIP TI MEDIUM 24 (CLIP) ×3 IMPLANT
CLIP TI WIDE RED SMALL 24 (CLIP) ×3 IMPLANT
COVER SURGICAL LIGHT HANDLE (MISCELLANEOUS) ×3 IMPLANT
DERMABOND ADVANCED (GAUZE/BANDAGES/DRESSINGS) ×1
DERMABOND ADVANCED .7 DNX12 (GAUZE/BANDAGES/DRESSINGS) ×2 IMPLANT
DRAIN SNY WOU (WOUND CARE) IMPLANT
ELECT REM PT RETURN 9FT ADLT (ELECTROSURGICAL) ×3
ELECTRODE REM PT RTRN 9FT ADLT (ELECTROSURGICAL) ×2 IMPLANT
EVACUATOR SILICONE 100CC (DRAIN) IMPLANT
GAUZE SPONGE 4X4 12PLY STRL (GAUZE/BANDAGES/DRESSINGS) ×3 IMPLANT
GAUZE XEROFORM 1X8 LF (GAUZE/BANDAGES/DRESSINGS) ×3 IMPLANT
GLOVE BIO SURGEON STRL SZ7.5 (GLOVE) ×3 IMPLANT
GLOVE BIOGEL PI IND STRL 6.5 (GLOVE) ×2 IMPLANT
GLOVE BIOGEL PI IND STRL 7.5 (GLOVE) ×2 IMPLANT
GLOVE BIOGEL PI INDICATOR 6.5 (GLOVE) ×1
GLOVE BIOGEL PI INDICATOR 7.5 (GLOVE) ×1
GLOVE ECLIPSE 7.0 STRL STRAW (GLOVE) ×3 IMPLANT
GLOVE SS N UNI LF 6.0 STRL (GLOVE) ×3 IMPLANT
GOWN STRL REUS W/ TWL LRG LVL3 (GOWN DISPOSABLE) ×6 IMPLANT
GOWN STRL REUS W/TWL LRG LVL3 (GOWN DISPOSABLE) ×3
HEMOSTAT SPONGE AVITENE ULTRA (HEMOSTASIS) IMPLANT
KIT BASIN OR (CUSTOM PROCEDURE TRAY) ×3 IMPLANT
KIT ROOM TURNOVER OR (KITS) ×3 IMPLANT
NS IRRIG 1000ML POUR BTL (IV SOLUTION) ×6 IMPLANT
PACK GENERAL/GYN (CUSTOM PROCEDURE TRAY) IMPLANT
PACK PERIPHERAL VASCULAR (CUSTOM PROCEDURE TRAY) ×3 IMPLANT
PACK UNIVERSAL I (CUSTOM PROCEDURE TRAY) IMPLANT
PAD ARMBOARD 7.5X6 YLW CONV (MISCELLANEOUS) ×6 IMPLANT
PATCH HEMASHIELD 8X75 (Vascular Products) ×3 IMPLANT
STAPLER VISISTAT 35W (STAPLE) IMPLANT
SUT ETHILON 3 0 PS 1 (SUTURE) IMPLANT
SUT PROLENE 5 0 C 1 24 (SUTURE) ×6 IMPLANT
SUT PROLENE 6 0 CC (SUTURE) ×6 IMPLANT
SUT PROLENE 7 0 BV1 MDA (SUTURE) ×6 IMPLANT
SUT VIC AB 2-0 CTX 36 (SUTURE) IMPLANT
SUT VIC AB 2-0 SH 27 (SUTURE) ×1
SUT VIC AB 2-0 SH 27XBRD (SUTURE) ×2 IMPLANT
SUT VIC AB 3-0 SH 27 (SUTURE) ×2
SUT VIC AB 3-0 SH 27X BRD (SUTURE) ×4 IMPLANT
SUT VIC AB 4-0 PS2 27 (SUTURE) ×3 IMPLANT
SUT VICRYL 4-0 PS2 18IN ABS (SUTURE) IMPLANT
TRAY FOLEY W/METER SILVER 16FR (SET/KITS/TRAYS/PACK) ×3 IMPLANT
UNDERPAD 30X30 (UNDERPADS AND DIAPERS) ×3 IMPLANT
WATER STERILE IRR 1000ML POUR (IV SOLUTION) ×3 IMPLANT

## 2016-10-30 NOTE — Progress Notes (Addendum)
Multi small raised some red some with pus rash like areas noted right arm, under arm and one red dry are noted above umbilicus . Instructed pt wife to let the Dr know so he could assess areas. Called OR and Dr Darrick PennaFields informed.

## 2016-10-30 NOTE — Anesthesia Procedure Notes (Signed)
Procedure Name: Intubation Date/Time: 10/30/2016 2:17 PM Performed by: Reine JustFLOWERS, Froilan Mclean T Pre-anesthesia Checklist: Patient identified, Emergency Drugs available, Suction available, Patient being monitored and Timeout performed Patient Re-evaluated:Patient Re-evaluated prior to inductionOxygen Delivery Method: Circle system utilized and Simple face mask Preoxygenation: Pre-oxygenation with 100% oxygen Intubation Type: IV induction Ventilation: Mask ventilation without difficulty and Oral airway inserted - appropriate to patient size Laryngoscope Size: Miller and 3 Grade View: Grade I Tube type: Oral Tube size: 7.5 mm Number of attempts: 1 Airway Equipment and Method: Patient positioned with wedge pillow and Stylet Placement Confirmation: ETT inserted through vocal cords under direct vision,  positive ETCO2 and breath sounds checked- equal and bilateral Secured at: 22 cm Tube secured with: Tape Dental Injury: Teeth and Oropharynx as per pre-operative assessment

## 2016-10-30 NOTE — Transfer of Care (Signed)
Immediate Anesthesia Transfer of Care Note  Patient: Alan SeedsGlenn A Benjamin Jr.  Procedure(s) Performed: Procedure(s): ENDARTERECTOMY FEMORAL-RIGHT WITH PATCH GRAFT (Right)  Patient Location: PACU  Anesthesia Type:General  Level of Consciousness: awake, alert  and oriented  Airway & Oxygen Therapy: Patient Spontanous Breathing and Patient connected to nasal cannula oxygen  Post-op Assessment: Report given to RN and Post -op Vital signs reviewed and stable  Post vital signs: Reviewed and stable  Last Vitals:  Vitals:   10/30/16 0940  BP: (!) 108/59  Pulse: 70  Resp: 20  Temp: 36.9 C    Last Pain:  Vitals:   10/30/16 0940  TempSrc: Oral         Complications: No apparent anesthesia complications

## 2016-10-30 NOTE — Anesthesia Postprocedure Evaluation (Signed)
Anesthesia Post Note  Patient: Alan SeedsGlenn A Schiltz Jr.  Procedure(s) Performed: Procedure(s) (LRB): ENDARTERECTOMY FEMORAL-RIGHT WITH PATCH GRAFT (Right)  Patient location during evaluation: PACU Anesthesia Type: General Level of consciousness: awake and alert Pain management: pain level controlled Vital Signs Assessment: post-procedure vital signs reviewed and stable Respiratory status: spontaneous breathing, nonlabored ventilation, respiratory function stable and patient connected to nasal cannula oxygen Cardiovascular status: blood pressure returned to baseline and stable Postop Assessment: no signs of nausea or vomiting Anesthetic complications: no       Last Vitals:  Vitals:   10/30/16 1750 10/30/16 1805  BP: 129/77 (!) 148/74  Pulse: 70 69  Resp: 13 13  Temp:      Last Pain:  Vitals:   10/30/16 0940  TempSrc: Oral                 Emmamarie Kluender DAVID

## 2016-10-30 NOTE — Op Note (Signed)
Procedure: Right common femoral endarterectomy  Preoperative diagnosis: Nonhealing wound right leg  postoperative diagnosis: Same  Anesthesia: Gen.  Assistant: Lianne CureMaureen  Collins PA-C  Operative findings: #1 Severe calcific atherosclerotic plaque right common femoral artery                                 #2 Dacron patch left common femoral artery   Operative details: After obtaining informed consent, the patient was taken to operating room. The patient was placed in supine position operating table. After induction of general anesthesia and endotracheal intubation, the patient's entire right lower extremity was prepped and draped in usual sterile fashion. A longitudinal incision was made in the right groin care down the saphenous tissues down level left common femoral artery. This was severely calcified. Dissection was carried down to level the femoral bifurcation. The superficial femoral artery was dissected free circumferentially as well as the profunda femoris artery. Both these had vessel loops placed around them. Dissection was then carried out the level of the inguinal ligament. The common femoral artery was still heavily calcified in this area. Therefore I dissected up underneath the inguinal ligament ligating several venous tributaries crossing branches with silk ties. I dissected out the circumflex iliac branches at the level of the inguinal ligament and then placed a vessel loop around the distal external iliac artery as well. The distal external iliac artery was soft and character and clampable. At this point patient was given 8,000 units of intravenous heparin. A longitudinal opening was made in the common femoral artery. There is a large amount of calcific plaque nearly occluding the common femoral artery. Found a suitable plane to begin an endarterectomy in the common femoral endarterectomy was carried proximally up into the distal external iliac artery. A plug of plaque was removed creating  a flush proximal endpoint. I then carried the endarterectomy distally down to the femoral bifurcation. A good endpoint was obtained in the proximal profunda but there was a slight stepoff at the bifurcation which I tacked with several 7 0 prolene sutures.   There was good backbleeding from the profunda and superficial femoral arteries. There was excellent arterial inflow. A Dacron patch was then brought up in the operative field and sewn on as a patch angioplasty after removing all loose debris from the artery. Just prior completion anastomosis everything was forebled backbled and thoroughly flushed. Anastomosis was secured clamps released there was pulsatile flow in the SFA and profunda immediately.  Hemostasis was obtained with protamine as well as direct pressure. The groin was then closed in multiple layers of running 2 0 3-0 Vicryl suture followed by 4 0 Vicryl subcuticular stitch in the skin and Dermabond on the skin. Patient tolerated the procedure well and there were no complications.  The instrument sponge and needle count was correct at the end of the case. The patient was taken to recovery room in stable condition.  Fabienne Brunsharles Fields, MD [facility] Office: [phone] Pager: [phone]

## 2016-10-30 NOTE — Anesthesia Preprocedure Evaluation (Addendum)
Anesthesia Evaluation  Patient identified by MRN, date of birth, ID band Patient awake    Reviewed: Allergy & Precautions, NPO status , Patient's Chart, lab work & pertinent test results  Airway Mallampati: I  TM Distance: >3 FB Neck ROM: Full    Dental  (+) Dental Advisory Given   Pulmonary former smoker,    Pulmonary exam normal        Cardiovascular + Past MI  Normal cardiovascular exam     Neuro/Psych CVA    GI/Hepatic   Endo/Other  diabetes, Type 2, Insulin Dependent, Oral Hypoglycemic Agents  Renal/GU      Musculoskeletal   Abdominal   Peds  Hematology   Anesthesia Other Findings   Reproductive/Obstetrics                            Anesthesia Physical Anesthesia Plan  ASA: III  Anesthesia Plan: General   Post-op Pain Management:    Induction: Intravenous  Airway Management Planned: Oral ETT  Additional Equipment:   Intra-op Plan:   Post-operative Plan: Extubation in OR  Informed Consent: I have reviewed the patients History and Physical, chart, labs and discussed the procedure including the risks, benefits and alternatives for the proposed anesthesia with the patient or authorized representative who has indicated his/her understanding and acceptance.     Plan Discussed with: CRNA and Surgeon  Anesthesia Plan Comments:         Anesthesia Quick Evaluation

## 2016-10-30 NOTE — Interval H&P Note (Signed)
History and Physical Interval Note:  10/30/2016 1:27 PM  Alan SeedsGlenn A Alanis Jr.  has presented today for surgery, with the diagnosis of Peripheral Vascular Disease with nonhealing wound right foot  I70.234  The various methods of treatment have been discussed with the patient and family. After consideration of risks, benefits and other options for treatment, the patient has consented to  Procedure(s): ENDARTERECTOMY FEMORAL-RIGHT (Right) IRRIGATION AND DEBRIDEMENT EXTREMITY-RIGHT FOOT (Right) as a surgical intervention .  The patient's history has been reviewed, patient examined, no change in status, stable for surgery.  I have reviewed the patient's chart and labs.  Questions were answered to the patient's satisfaction.    Pt has some small pustules on his forearm that look like insect bites.  I do not believe these should affect operation.  Right foot wound is clean and granulating so we will not debride this today.   Fabienne BrunsFields, Maree Ainley

## 2016-10-30 NOTE — H&P (View-Only) (Signed)
  Referring Physician: Julie Williams MD  Patient name: Alan [name] MRN: [id number] DOB: 07/05/1945 Sex: male  REASON FOR CONSULT: Nonhealing wound right foot  HPI: Alan [name] is a 71 y.o. male with known peripheral arterial disease. He previously had a left below-knee amputation done at [facility] approximately 3 months ago. This was done for a nonhealing wound on his left leg. He has also had a previous right transmetatarsal amputation this was several years ago also at [facility]. About 3-4 months ago he developed an ulceration on the tip of the transmetatarsal amputation. It has failed to heal despite aggressive local wound care. It is currently being treated with collagenase. He is on aspirin daily. He is not on a statin. The patient denies claudication symptoms but has not really been ambulatory and almost 6 months. He does describe rest pain in the right foot occasionally. He has fairly significant dementia and almost 100% of his care is from his wife. He was able to transfer and walk some on his right leg prior to 6 months ago. The patient's wife denies any prior interventions on his arterial circulation. He is a former smoker quit about 5 months ago. He does have sun downing every evening. Other medical problems include anemia, arthritis, coronary artery disease all of which are stable.  Past Medical History:  Diagnosis Date  . Anemia   . Arthritis   . Diabetes mellitus without complication (HCC)   . Myocardial infarction (HCC)   . Peripheral arterial disease (HCC)   . Stroke (HCC)     Past surgical history:  Previous colostomy and colostomy takedown, right transmetatarsal amputation, left below-knee amputation, coronary stenting  Family History  Problem Relation Age of Onset  . Diabetes Mother   . Heart disease Father   . Heart disease Brother     SOCIAL HISTORY: Social History   Social History  . Marital status: Married    Spouse name: N/A  . Number of  children: N/A  . Years of education: N/A   Occupational History  . Not on file.   Social History Main Topics  . Smoking status: Not on file  . Smokeless tobacco: Not on file  . Alcohol use Not on file  . Drug use: Unknown  . Sexual activity: Not on file   Other Topics Concern  . Not on file   Social History Narrative  . No narrative on file  He is a former smoker. He quit 5 months ago.  Allergies  Allergen Reactions  . Penicillins Rash    Current Outpatient Prescriptions  Medication Sig Dispense Refill  . acetaminophen (TYLENOL) 650 MG CR tablet Take 650 mg by mouth every 8 (eight) hours as needed for pain.    . alendronate (FOSAMAX) 70 MG tablet Take by mouth.    . ALPRAZolam (XANAX) 1 MG tablet TAKE 1 TABLET TWICE DAILY AS NEEDED FOR AGITATION    . aspirin 81 MG EC tablet Take by mouth.    . collagenase (SANTYL) ointment Apply 1 application topically daily.    . ferrous sulfate 325 (65 FE) MG tablet Take by mouth.    . fluticasone (FLONASE) 50 MCG/ACT nasal spray Place into the nose.    . insulin glargine (LANTUS) 100 UNIT/ML injection Inject into the skin.    . meclizine (ANTIVERT) 25 MG tablet TAKE ONE TABLET (25 MG TOTAL) BY MOUTH 3 (THREE) TIMES A DAY AS NEEDED.    . metFORMIN (GLUCOPHAGE-XR) 750 MG 24 hr tablet TAKE   ONE TAB WITH BREAKFAST AND ONE TAB WITH SUPPER    . sennosides-docusate sodium (SENOKOT-S) 8.6-50 MG tablet Take 1 tablet by mouth daily.    . sertraline (ZOLOFT) 50 MG tablet Take by mouth.    . traZODone (DESYREL) 50 MG tablet TAKE ONE TABLET (50 MG TOTAL) BY MOUTH AT BEDTIME.  2   No current facility-administered medications for this visit.     ROS:   General:  No weight loss, Fever, chills  HEENT: No recent headaches, no nasal bleeding, no visual changes, no sore throat  Neurologic: No dizziness, blackouts, seizures. No recent symptoms of stroke or mini- stroke. No recent episodes of slurred speech, or temporary blindness.  Cardiac: No recent  episodes of chest pain/pressure, no shortness of breath at rest.  + shortness of breath with exertion.  Denies history of atrial fibrillation or irregular heartbeat  Vascular: + history of rest pain in feet.  No history of claudication.  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 He also currently has a broken left wrist.  Hematologic:No history of hypercoagulable state.  No history of easy bleeding.  No history of anemia  Gastrointestinal: No hematochezia or melena,  No gastroesophageal reflux, no trouble swallowing  Urinary: [ ]  chronic Kidney disease, [ ]  on HD - [ ]  MWF or [ ]  TTHS, [ ]  Burning with urination, [ ]  Frequent urination, [ ]  Difficulty urinating;   Skin: No rashes  Psychological: No history of anxiety,  No history of depression   Physical Examination  Vitals:   10/09/16 1205  BP: 124/68  Pulse: 80  Resp: (!) 28  Temp: 98 F (36.7 C)  TempSrc: Oral  SpO2: 93%  Weight: 170 lb (77.1 kg)  Height: 6\' 1"  (1.854 m)    Body mass index is 22.43 kg/m.  General:  Alert and oriented, no acute distress HEENT: Normal Neck: No bruit or JVD Pulmonary: Clear to auscultation bilaterally Cardiac: Regular Rate and Rhythm without murmur Abdomen: Soft, non-tender, non-distended Skin: No rash, 3 cm less than 1 mm depth ulceration with some granulation tissue proximal front aspect of right transmetatarsal amputation Extremity Pulses:  1+ left difficult to palpate right femoral, absent right dorsalis pedis, posterior tibial pulses  Musculoskeletal: No deformity or edema  Neurologic: Right Upper and bilateral lower extremity motor 5/5 and symmetric  DATA:  Patient had a right leg ABI today which was monophasic and 0.72  ASSESSMENT:  Patient with  peripheral arterial disease now with nonhealing wound on the right foot at risk for limb loss. He overall is fairly debilitated. Had lengthy discussion with the patient and his wife today that we would do an arteriogram lower extremity runoff to see if there is a possible percutaneous intervention. However I do not believe he is a candidate for an open operation if he is not a candidate for percutaneous procedure. Otherwise the plan would be continued wound care with an amputation only if this fails long-term.   PLAN:  See above. The patient will be scheduled for aortogram lower extremity runoff either this Friday or next Friday depending on scheduling the wife will call us back regarding this.   Fabienne Brunsharles Fields, MD [facility] [place] Office: [phone] Pager: [phone]

## 2016-10-31 ENCOUNTER — Inpatient Hospital Stay (HOSPITAL_COMMUNITY): Payer: Medicare (Managed Care)

## 2016-10-31 ENCOUNTER — Encounter (HOSPITAL_COMMUNITY): Payer: Self-pay | Admitting: Vascular Surgery

## 2016-10-31 DIAGNOSIS — I739 Peripheral vascular disease, unspecified: Secondary | ICD-10-CM

## 2016-10-31 LAB — CBC
HCT: 32.2 % — ABNORMAL LOW (ref 39.0–52.0)
Hemoglobin: 10.3 g/dL — ABNORMAL LOW (ref 13.0–17.0)
MCH: 28.3 pg (ref 26.0–34.0)
MCHC: 32 g/dL (ref 30.0–36.0)
MCV: 88.5 fL (ref 78.0–100.0)
PLATELETS: 164 10*3/uL (ref 150–400)
RBC: 3.64 MIL/uL — AB (ref 4.22–5.81)
RDW: 13.5 % (ref 11.5–15.5)
WBC: 7.4 10*3/uL (ref 4.0–10.5)

## 2016-10-31 LAB — BASIC METABOLIC PANEL
Anion gap: 8 (ref 5–15)
BUN: 11 mg/dL (ref 6–20)
CHLORIDE: 101 mmol/L (ref 101–111)
CO2: 27 mmol/L (ref 22–32)
Calcium: 8.5 mg/dL — ABNORMAL LOW (ref 8.9–10.3)
Creatinine, Ser: 0.64 mg/dL (ref 0.61–1.24)
GFR calc non Af Amer: >60 mL/min (ref >60)
Glucose, Bld: 181 mg/dL — ABNORMAL HIGH (ref 65–99)
POTASSIUM: 4.1 mmol/L (ref 3.5–5.1)
Sodium: 136 mmol/L (ref 135–145)

## 2016-10-31 LAB — HEMOGLOBIN A1C
Hgb A1c MFr Bld: 9.5 % — ABNORMAL HIGH (ref 4.8–5.6)
Mean Plasma Glucose: 226 mg/dL

## 2016-10-31 LAB — GLUCOSE, CAPILLARY: Glucose-Capillary: 111 mg/dL — ABNORMAL HIGH (ref 65–99)

## 2016-10-31 MED ORDER — CHLORHEXIDINE GLUCONATE CLOTH 2 % EX PADS
6.0000 | MEDICATED_PAD | Freq: Every day | CUTANEOUS | Status: DC
  Administered 2016-10-31: 6 via TOPICAL

## 2016-10-31 MED ORDER — OXYCODONE-ACETAMINOPHEN 5-325 MG PO TABS
1.0000 | ORAL_TABLET | Freq: Four times a day (QID) | ORAL | 0 refills | Status: DC | PRN
Start: 2016-10-31 — End: 2017-06-23

## 2016-10-31 MED ORDER — MUPIROCIN 2 % EX OINT
1.0000 "application " | TOPICAL_OINTMENT | Freq: Two times a day (BID) | CUTANEOUS | Status: DC
  Administered 2016-10-31: 1 via NASAL
  Filled 2016-10-31: qty 22

## 2016-10-31 MED ORDER — LEVOFLOXACIN 750 MG PO TABS: 750.0000 mg | ORAL_TABLET | Freq: Every day | ORAL | 0 refills | Status: DC

## 2016-10-31 NOTE — Evaluation (Signed)
Physical Therapy Evaluation Patient Details Name: Alan [name]. MRN: [id number] DOB: [birth year] Today's Date: 10/31/2016   History of Present Illness  Pt is a 71 y.o. male s/p Right Endarterectomy Femoral. 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  Clinical Impression  Patient presents with limited mobility mainly due to pain.  At baseline requires assist for transfers, but currently max A and wife with painful L shoulder.  Feel hoyer lift for home indicated and [place] aide.  Currently receiving [facility] services and continues to be appropriate for that.  Will sign off due to planned d/c home today.    Follow Up Recommendations Other (comment) ([place] aide; [facility])    Equipment Recommendations  Other (comment) (hoyer lift)    Recommendations for Other Services       Precautions / Restrictions Precautions Precautions: Fall Other Brace/Splint: L wrist splint Restrictions Weight Bearing Restrictions: No Other Position/Activity Restrictions: Wife reports L wrist fx and NWB on RLE; L BKA      Mobility  Bed Mobility Overal bed mobility: Needs Assistance Bed Mobility: Rolling;Supine to Sit Rolling: Min assist   Supine to sit: Mod assist     General bed mobility comments: able to move legs off bed and initiate coming upright, difficulty so assisted with trunk, but pt using L UE despite wife reporting supposed NWB due to nonhealing wrist fx  Transfers Overall transfer level: Needs assistance   Transfers: Squat Pivot Transfers     Squat pivot transfers: Max assist     General transfer comment: wife provided lifting and lowering assistance to get bed to w/c  Ambulation/Gait                Stairs            Wheelchair Mobility    Modified Rankin (Stroke Patients Only)       Balance Overall balance assessment: Needs assistance   Sitting balance-Leahy Scale: Poor Sitting  balance - Comments: leaning back on bed with sunken middle, UE support for balance                                     Pertinent Vitals/Pain Pain Assessment: 0-10 Pain Score: 7  Faces Pain Scale: Hurts even more Pain Location: Groing and R foot Pain Descriptors / Indicators: Guarding;Sore Pain Intervention(s): Monitored during session;Limited activity within patient's tolerance    Home Living Family/patient expects to be discharged to:: Private residence Living Arrangements: Spouse/significant other Available Help at Discharge: Family;Available 24 hours/day Type of Home: House Home Access: Ramped entrance     Home Layout: One level Home Equipment: Walker - 2 wheels;Bedside commode;Tub bench;Wheelchair - Education administrator (comment);Hospital bed (slide board, overhead trapeze and gait belt) Additional Comments: Currently remodeling bathroom and house for handicap accessibility. [facility] attends [facility] 3x per [facility] currently; has option to increase to 5x per week if desired. [place] aide 3x per week; working on getting new aide for additional assist.    Prior Function Level of Independence: Needs assistance   Gait / Transfers Assistance Needed: Limited to transfers with assist of wife. Wife reports "on good days" pt able to perform slide board transfers with assist. On bad days; wife performs squat pivot transfer.  ADL's / Homemaking Assistance Needed: Wife assisting with all ADL; including self feeding at times.  Hand Dominance   Dominant Hand: Right    Extremity/Trunk Assessment   Upper Extremity Assessment Upper Extremity Assessment: Defer to OT evaluation LUE Deficits / Details: Limited assessment due to possible wrist fx and shoulder pain.  LUE: Unable to fully assess due to pain;Unable to fully assess due to immobilization    Lower Extremity Assessment Lower Extremity Assessment: RLE deficits/detail;LLE deficits/detail RLE Deficits / Details: bandage around foot,  noted groin wound while changing brief; able to move unaided, but painful so MMT not performed LLE Deficits / Details: BKA, moves unrestricted    Cervical / Trunk Assessment Cervical / Trunk Assessment: Kyphotic  Communication   Communication: No difficulties  Cognition Arousal/Alertness: Awake/alert Behavior During Therapy: WFL for tasks assessed/performed;Flat affect Overall Cognitive Status: History of cognitive impairments - at baseline                                        General Comments General comments (skin integrity, edema, etc.): wife feeding pt in bed, assisted with clothing in bed and EOB and with transfer    Exercises     Assessment/Plan    PT Assessment All further PT needs can be met in the next venue of care  PT Problem List Decreased strength;Decreased balance;Decreased knowledge of use of DME;Pain;Decreased mobility;Decreased knowledge of precautions       PT Treatment Interventions      PT Goals (Current goals can be found in the Care Plan section)  Acute Rehab PT Goals Patient Stated Goal: return home PT Goal Formulation: With patient/family    Frequency     Barriers to discharge        Co-evaluation               AM-PAC PT "6 Clicks" Daily Activity  Outcome Measure Difficulty turning over in bed (including adjusting bedclothes, sheets and blankets)?: Total Difficulty moving from lying on back to sitting on the side of the bed? : Total Difficulty sitting down on and standing up from a chair with arms (e.g., wheelchair, bedside commode, etc,.)?: Total Help needed moving to and from a bed to chair (including a wheelchair)?: A Lot Help needed walking in hospital room?: Total Help needed climbing 3-5 steps with a railing? : Total 6 Click Score: 7    End of Session Equipment Utilized During Treatment: Gait belt   Patient left: in chair;with family/visitor present   PT Visit Diagnosis: Pain;Muscle weakness (generalized)  (M62.81);History of falling (Z91.81) Pain - Right/Left: Right Pain - part of body: Ankle and joints of foot    Time: 9024-0973 PT Time Calculation (min) (ACUTE ONLY): 15 min   Charges:   PT Evaluation $PT Eval Moderate Complexity: 1 Procedure     PT G CodesMagda Kiel, [place] 10/31/2016   Reginia Naas 10/31/2016, 10:11 AM

## 2016-10-31 NOTE — Plan of Care (Signed)
Problem: Health Behavior/Discharge Planning: Goal: Ability to manage health-related needs will improve Outcome: Completed/Met Date Met: 10/31/16 Patient previously set up with [facility]. Unit CM arranged for equipment to be delivered by [facility].

## 2016-10-31 NOTE — Care Management Note (Addendum)
Case Management Note  Patient Details  Name: Margaretha SeedsGlenn A Hooser Jr. MRN: [id number] Date of Birth: August 11, 1945  Subjective/Objective:    From home with wife,  POD # 1 Rightcommon femoral endarterectomy.  He is a patient with [facility].  He has an Engineer, productionaide with [facility].  He will need a hoyer lift, [place] [facility].  pateint is for dc today. The hoyer lift will be delivered to his home.  [facility] is arranging transport for patient  With [facility].  [facility] states patient has an aide with them.  [facility], stating [facility]  NCM will inform [facility].          Action/Plan: NCM will follow for dc needs.  Expected Discharge Date:  10/31/16               Expected Discharge Plan:  Home/Self Care  In-House Referral:     Discharge planning Services  CM Consult  Post Acute Care Choice:  Resumption of Svcs/PTA Provider Choice offered to:     DME Arranged:    DME Agency:     HH Arranged:    HH Agency:     Status of Service:  Completed, signed off  If discussed at [employer] Meetings, dates discussed:    Additional Comments:  Leone Havenaylor, Colman Birdwell Clinton, RN 10/31/2016, 12:01 PM

## 2016-10-31 NOTE — Progress Notes (Signed)
Patient to be discharged to home with spouse. Arrangements made with [facility] and [facility]. PIV x1 removed. CCMD notified of discharge. Instructions reviewed with patient's spouse - s/s surgical site infection, care of surgical site, when to call MD, new/previous medications, follow up appts, etc. All questions answered. Handouts given on femoral endartectomy. All belongings returned and to be sent home with patient.   Leanna BattlesEckelmann, Tami Blass Eileen, RN

## 2016-10-31 NOTE — Progress Notes (Signed)
VASCULAR LAB PRELIMINARY  ARTERIAL  ABI completed: Right ABI of 0.81 is suggestive of mild arterial occlusive disease at rest. Unable to obtain left ABI due to amputation   RIGHT    LEFT    PRESSURE WAVEFORM  PRESSURE WAVEFORM  BRACHIAL 100 Triphasic BRACHIAL 83 Biphasic  DP 81 Biphasic DP    PT 81 Biphasic PT      RIGHT LEFT  ABI 0.81      Alan StainGregory [name] [name], RVT 10/31/2016, 11:58 AM

## 2016-10-31 NOTE — Progress Notes (Addendum)
[facility] [place]  Subjective  - Doing OK wife in room.   Objective (!) 110/59 68 98.5 F (36.9 C) (Oral) 12 94%  Intake/Output Summary (Last 24 hours) at 10/31/16 0729 Last data filed at 10/31/16 [id number]  Gross per 24 hour  Intake             3545 ml  Output             2975 ml  Net              570 ml    Doppler peroneal signal Right foot dressing clean and dry Right groin soft without hematoma Right are axillary pustules no change  Assessment/Planning: POD # 1 Right common femoral endarterectomy  Patent flow peroneal Continue home routine dressing changes to right foot wound D/P PO antibiotics Levaquin daily for 10 days right arm skin wounds.  Bath daily in dial soap. F/U in 2-3 weeks with Dr. Darrick PennaFields Nasal MRSA, bactrim to nares daily for 5 days.  Clinton GallantCOLLINS, EMMA [facility] 10/31/2016 7:29 AM -- Incision clean right foot warm Agree with above. D./c home  Fabienne Brunsharles Sybrina Laning, MD [facility] [place] Office: [phone] Pager: [phone]  Laboratory Lab Results:  Recent Labs  10/30/16 0851 10/31/16 0259  WBC 8.2 7.4  HGB 11.8* 10.3*  HCT 36.5* 32.2*  PLT 184 164   BMET  Recent Labs  10/30/16 0851 10/31/16 0259  NA 137 136  K 4.7 4.1  CL 102 101  CO2 26 27  GLUCOSE 266* 181*  BUN 13 11  CREATININE 0.70 0.64  CALCIUM 8.9 8.5*    COAG Lab Results  Component Value Date   INR 1.03 10/30/2016   No results found for: PTT

## 2016-10-31 NOTE — Evaluation (Signed)
Occupational Therapy Evaluation and Discharge Patient Details Name: Alan [name]. MRN: [id number] DOB: 10/22/1945 Today's Date: 10/31/2016    History of Present Illness Pt is a 71 y.o. male s/p Right Endarterectomy Femoral. 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   Clinical Impression   Per wife; pt dependent with ADL PTA and was performing slide board/squat pivot transfers with assist. Currently pt max-total assist for ADL at bed level. Pt able to roll in bed for cleaning after incontinent episode with min assist. Recommending HH aide for decreased burden of care upon return home in addition to a hoyer lift to assist with transfers. No further acute OT needs identified; signing off at this time. Please re-consult if needs change. Thank you for this referral.    Follow Up Recommendations  Supervision/Assistance - 24 hour;Other (comment) (HH aide)    Equipment Recommendations  Other (comment) Michiel Sites lift)    Recommendations for Other Services       Precautions / Restrictions Precautions Precautions: Fall Restrictions Weight Bearing Restrictions: No Other Position/Activity Restrictions: Wife reports L wrist fx and NWB on RLE      Mobility Bed Mobility Overal bed mobility: Needs Assistance Bed Mobility: Rolling Rolling: Min assist         General bed mobility comments: Min hand held assist to roll for cleaning. Pt with good use of grab bars with HOB flat  Transfers                 General transfer comment: Not assessed at this time.    Balance                                           ADL either performed or assessed with clinical judgement   ADL Overall ADL's : Needs assistance/impaired Eating/Feeding: Maximal assistance;Bed level   Grooming: Bed level;Wash/dry face;Total assistance   Upper Body Bathing: Bed level;Total assistance   Lower  Body Bathing: Bed level;Total assistance   Upper Body Dressing : Maximal assistance;Bed level   Lower Body Dressing: Total assistance;Bed level                 General ADL Comments: Discussed equipment needs with pts wife; she would like a hoyer lift for home as she is performing the majority of transfers at this time and has a L rotator cuff tear. Also discussed HH aide for additional assist at home.     Vision   Additional Comments: Appears WFL.     Perception     Praxis      Pertinent Vitals/Pain Pain Assessment: Faces Faces Pain Scale: Hurts even more Pain Location: L shoulder, bil groin Pain Descriptors / Indicators: Guarding;Sore Pain Intervention(s): Monitored during session;Limited activity within patient's tolerance     Hand Dominance Right   Extremity/Trunk Assessment Upper Extremity Assessment Upper Extremity Assessment: LUE deficits/detail LUE Deficits / Details: Limited assessment due to possible wrist fx and shoulder pain.  LUE: Unable to fully assess due to pain;Unable to fully assess due to immobilization   Lower Extremity Assessment Lower Extremity Assessment: Defer to PT evaluation       Communication Communication Communication: No difficulties   Cognition Arousal/Alertness: Awake/alert Behavior During Therapy: WFL for tasks assessed/performed;Flat affect Overall Cognitive Status: History of cognitive impairments - at baseline  General Comments       Exercises     Shoulder Instructions      Home Living Family/patient expects to be discharged to:: Private residence Living Arrangements: Spouse/significant other Available Help at Discharge: Family;Available 24 hours/day Type of Home: House Home Access: Ramped entrance     Home Layout: One level     Bathroom Shower/Tub: Chief Strategy OfficerTub/shower unit   Bathroom Toilet: Standard Bathroom Accessibility: Yes How Accessible: Accessible via  wheelchair Home Equipment: Walker - 2 wheels;Bedside commode;Tub bench;Wheelchair - Careers advisermanual;Other (comment);Hospital bed (slide board, overhead trapeze bar, gait belt)   Additional Comments: Currently remodeling bathroom and house for handicap accessibility. [facility] attends [facility] per [facility] currently; has option to increase to 5x per week if desired. HH aide 3x per week; working on getting new aide for additional assist.      Prior Functioning/Environment Level of Independence: Needs assistance  Gait / Transfers Assistance Needed: Limited to transfers with assist of wife. Wife reports "on good days" pt able to perform slide board transfers with assist. On bad days; wife performs squat pivot transfer. ADL's / Homemaking Assistance Needed: Wife assisting with all ADL; including self feeding at times.            OT Problem List: Decreased strength;Decreased activity tolerance;Impaired balance (sitting and/or standing);Decreased cognition;Pain;Impaired UE functional use      OT Treatment/Interventions:      OT Goals(Current goals can be found in the care plan section) Acute Rehab OT Goals Patient Stated Goal: return home OT Goal Formulation: All assessment and education complete, DC therapy  OT Frequency:     Barriers to D/C:            Co-evaluation              AM-PAC PT "6 Clicks" Daily Activity     Outcome Measure Help from another person eating meals?: A Lot Help from another person taking care of personal grooming?: Total Help from another person toileting, which includes using toliet, bedpan, or urinal?: Total Help from another person bathing (including washing, rinsing, drying)?: Total Help from another person to put on and taking off regular upper body clothing?: Total Help from another person to put on and taking off regular lower body clothing?: Total 6 Click Score: 7   End of Session Nurse Communication: Patient requests pain meds  Activity Tolerance: Patient  tolerated treatment well Patient left: in bed;with call bell/phone within reach;with family/visitor present  OT Visit Diagnosis: Other abnormalities of gait and mobility (R26.89);Muscle weakness (generalized) (M62.81)                Time: [id number] OT Time Calculation (min): 35 min Charges:  OT General Charges $OT Visit: 1 Procedure OT Evaluation $OT Eval Moderate Complexity: 1 Procedure OT Treatments $Self Care/Home Management : 8-22 mins G-Codes:     Fredric MareBailey A. Brett Albinooffey, M.S., OTR/L Pager: [phone]  Gaye AlkenBailey A Virl Coble 10/31/2016, 9:31 AM

## 2016-11-01 ENCOUNTER — Encounter (HOSPITAL_BASED_OUTPATIENT_CLINIC_OR_DEPARTMENT_OTHER): Payer: Medicare (Managed Care) | Attending: Internal Medicine

## 2016-11-01 ENCOUNTER — Telehealth: Payer: Self-pay | Admitting: Vascular Surgery

## 2016-11-01 DIAGNOSIS — Z87891 Personal history of nicotine dependence: Secondary | ICD-10-CM | POA: Diagnosis not present

## 2016-11-01 DIAGNOSIS — Z8673 Personal history of transient ischemic attack (TIA), and cerebral infarction without residual deficits: Secondary | ICD-10-CM | POA: Insufficient documentation

## 2016-11-01 DIAGNOSIS — Z89512 Acquired absence of left leg below knee: Secondary | ICD-10-CM | POA: Diagnosis not present

## 2016-11-01 DIAGNOSIS — L89893 Pressure ulcer of other site, stage 3: Secondary | ICD-10-CM | POA: Insufficient documentation

## 2016-11-01 DIAGNOSIS — L97512 Non-pressure chronic ulcer of other part of right foot with fat layer exposed: Secondary | ICD-10-CM | POA: Insufficient documentation

## 2016-11-01 DIAGNOSIS — I2510 Atherosclerotic heart disease of native coronary artery without angina pectoris: Secondary | ICD-10-CM | POA: Insufficient documentation

## 2016-11-01 DIAGNOSIS — E11621 Type 2 diabetes mellitus with foot ulcer: Secondary | ICD-10-CM | POA: Diagnosis not present

## 2016-11-01 DIAGNOSIS — I70201 Unspecified atherosclerosis of native arteries of extremities, right leg: Secondary | ICD-10-CM | POA: Insufficient documentation

## 2016-11-01 DIAGNOSIS — Z89431 Acquired absence of right foot: Secondary | ICD-10-CM | POA: Diagnosis not present

## 2016-11-01 DIAGNOSIS — Z794 Long term (current) use of insulin: Secondary | ICD-10-CM | POA: Insufficient documentation

## 2016-11-01 DIAGNOSIS — E1140 Type 2 diabetes mellitus with diabetic neuropathy, unspecified: Secondary | ICD-10-CM | POA: Insufficient documentation

## 2016-11-01 DIAGNOSIS — F0390 Unspecified dementia without behavioral disturbance: Secondary | ICD-10-CM | POA: Insufficient documentation

## 2016-11-01 DIAGNOSIS — Z955 Presence of coronary angioplasty implant and graft: Secondary | ICD-10-CM | POA: Diagnosis not present

## 2016-11-01 LAB — GLUCOSE, CAPILLARY: GLUCOSE-CAPILLARY: 245 mg/dL — AB (ref 65–99)

## 2016-11-01 NOTE — Telephone Encounter (Signed)
-----   Message from Sharee PimpleMarilyn K McChesney, RN sent at 10/31/2016 12:56 PM EDT ----- Regarding: 2-3 weeks    ----- Message ----- From: Lars Mageollins, Emma M, PA-C Sent: 10/31/2016   7:36 AM To: Vvs Charge Pool  S/P right femoral endarterectomy f/u with Dr. Darrick PennaFields in 2-3 weeks

## 2016-11-01 NOTE — Telephone Encounter (Signed)
Spoke to pts spouse about appt, advised will mail letter as well for 6/7   She did state that he was feeling weak and wanted to make sure this was normal.

## 2016-11-04 NOTE — Discharge Summary (Signed)
[facility] Discharge Summary   Patient ID:  Alan [name]. MRN: [id number] DOB/AGE: 1946-02-12 71 y.o.  Admit date: 10/30/2016 Discharge date: 10/31/2016 Date of Surgery: 10/30/2016 Surgeon: Surgeon(s): Sherren Kerns, MD  Admission Diagnosis: Peripheral Vascular Disease with nonhealing wound right foot  I70.234  Discharge Diagnoses:  Peripheral Vascular Disease with nonhealing wound right foot  I70.234  Secondary Diagnoses: Past Medical History:  Diagnosis Date  . Anemia   . Arthritis   . Dementia   . Diabetes mellitus without complication (HCC)   . ETOH abuse   . Myocardial infarction (HCC)   . Peripheral arterial disease (HCC)   . Stroke (HCC)   . Wears dentures   . Wears glasses     Procedure(s): ENDARTERECTOMY FEMORAL-RIGHT WITH PATCH GRAFT  Discharged Condition: good  HPI: Non healing right foot wound.  Alan [name] is a 71 y.o. male with known peripheral arterial disease. He previously had a left below-knee amputation done at [facility] approximately 3 months ago. This was done for a nonhealing wound on his left leg. He has also had a previous right transmetatarsal amputation this was several years ago also at [facility]. About 3-4 months ago he developed an ulceration on the tip of the transmetatarsal amputation. It has failed to heal despite aggressive local wound care. It is currently being treated with collagenase. He is on aspirin daily. He is not on a statin. The patient denies claudication symptoms but has not really been ambulatory and almost 6 months. He does describe rest pain in the right foot occasionally. He has fairly significant dementia and almost 100% of his care is from his wife. He was able to transfer and walk some on his right leg prior to 6 months ago. The patient's wife denies any prior interventions on his arterial circulation. He is a former smoker quit about 5 months ago. He does have sun downing every evening. Other  medical problems include anemia, arthritis, coronary artery disease all of which are stable. DATA:  Patient had a right leg ABI today which was monophasic and 0.72  Angiogram 10/25/2016:subtotal occlusion right common femoral artery, one vessel runoff peroneal artery patent right superficial femoral-popliteal artery.   Hospital Course:  Alan John. is a 71 y.o. male is S/P Right Procedure(s): ENDARTERECTOMY FEMORAL-RIGHT WITH PATCH GRAFT   POD # 1 Rightcommon femoral endarterectomy  Patent flow peroneal Continue home routine dressing changes to right foot wound D/P PO antibiotics Levaquin daily for 10 days right arm skin wounds.  Bath daily in dial soap. F/U in 2-3 weeks with Dr. Darrick Penna Nasal MRSA, bactrim to nares daily for 5 days.  Significant Diagnostic Studies: CBC Lab Results  Component Value Date   WBC 7.4 10/31/2016   HGB 10.3 (L) 10/31/2016   HCT 32.2 (L) 10/31/2016   MCV 88.5 10/31/2016   PLT 164 10/31/2016    BMET    Component Value Date/Time   NA 136 10/31/2016 0259   K 4.1 10/31/2016 0259   CL 101 10/31/2016 0259   CO2 27 10/31/2016 0259   GLUCOSE 181 (H) 10/31/2016 0259   BUN 11 10/31/2016 0259   CREATININE 0.64 10/31/2016 0259   CALCIUM 8.5 (L) 10/31/2016 0259   GFRNONAA >60 10/31/2016 0259   GFRAA >60 10/31/2016 0259   COAG Lab Results  Component Value Date   INR 1.03 10/30/2016     Disposition:  Discharge to :Home Discharge Instructions    Call MD for:  redness, tenderness,  or signs of infection (pain, swelling, bleeding, redness, odor or green/yellow discharge around incision site)    Complete by:  As directed    Call MD for:  severe or increased pain, loss or decreased feeling  in affected limb(s)    Complete by:  As directed    Call MD for:  temperature >100.5    Complete by:  As directed    Discharge instructions    Complete by:  As directed    Continue home routine dressing changes to right foot wound D/P PO antibiotics  Levaquin daily for 10 days right arm skin wounds.  Bath daily in dial soap. F/U in 2-3 weeks with Dr. Darrick Penna Nasal MRSA, bactrim to nares daily for 5 days.   Increase activity slowly    Complete by:  As directed    Walk with assistance use walker or cane as needed   Resume previous diet    Complete by:  As directed      Allergies as of 10/31/2016      Reactions   Penicillins Rash   Has patient had a PCN reaction causing immediate rash, facial/tongue/throat swelling, SOB or lightheadedness with hypotension: Yes Has patient had a PCN reaction causing severe rash involving mucus membranes or skin necrosis: No Has patient had a PCN reaction that required hospitalization No Has patient had a PCN reaction occurring within the last 10 years: No If all of the above answers are "NO", then may proceed with Cephalosporin use.      Medication List    TAKE these medications   acetaminophen 650 MG CR tablet Commonly known as:  TYLENOL Take 650 mg by mouth 3 (three) times daily.   alendronate 70 MG tablet Commonly known as:  FOSAMAX Take 70 mg by mouth every Sunday.   ALPRAZolam 1 MG tablet Commonly known as:  XANAX Take 1mg  by mouth twice daily   aspirin 81 MG EC tablet Take 81 mg by mouth daily.   atorvastatin 10 MG tablet Commonly known as:  LIPITOR Take 10 mg by mouth every evening.   ferrous sulfate 325 (65 FE) MG tablet Take 325 mg by mouth daily.   fluticasone 50 MCG/ACT nasal spray Commonly known as:  FLONASE Place 1 spray into the nose daily as needed for rhinitis.   insulin glargine 100 UNIT/ML injection Commonly known as:  LANTUS Inject 38 Units into the skin at bedtime.   levofloxacin 750 MG tablet Commonly known as:  LEVAQUIN Take 1 tablet (750 mg total) by mouth daily.   metFORMIN 750 MG 24 hr tablet Commonly known as:  GLUCOPHAGE-XR TAKE ONE TAB WITH BREAKFAST AND ONE TAB WITH SUPPER   oxyCODONE-acetaminophen 5-325 MG tablet Commonly known as:   PERCOCET/ROXICET Take 1 tablet by mouth every 6 (six) hours as needed for moderate pain.   sennosides-docusate sodium 8.6-50 MG tablet Commonly known as:  SENOKOT-S Take 1 tablet by mouth daily as needed for constipation.   sertraline 50 MG tablet Commonly known as:  ZOLOFT Take 50 mg by mouth 2 (two) times daily.   tamsulosin 0.4 MG Caps capsule Commonly known as:  FLOMAX Take 0.4 mg by mouth every evening.   traZODone 50 MG tablet Commonly known as:  DESYREL TAKE ONE TABLET (50 MG TOTAL) BY MOUTH AT BEDTIME.      Verbal and written Discharge instructions given to the patient. Wound care per Discharge AVS Follow-up Information    Sherren Kerns, MD Follow up in 2 week(s).  Specialties:  Vascular Surgery, Cardiology Why:  office will call Contact information: [address] [place] [address] [phone]           Signed: Clinton GallantCOLLINS, Alan [name] [facility] 11/04/2016, 10:49 AM

## 2016-11-08 ENCOUNTER — Encounter (HOSPITAL_BASED_OUTPATIENT_CLINIC_OR_DEPARTMENT_OTHER): Payer: Medicare (Managed Care)

## 2016-11-14 DIAGNOSIS — E11621 Type 2 diabetes mellitus with foot ulcer: Secondary | ICD-10-CM | POA: Diagnosis not present

## 2016-11-25 ENCOUNTER — Encounter: Payer: Self-pay | Admitting: Vascular Surgery

## 2016-11-28 ENCOUNTER — Ambulatory Visit (INDEPENDENT_AMBULATORY_CARE_PROVIDER_SITE_OTHER): Payer: Self-pay | Admitting: Vascular Surgery

## 2016-11-28 ENCOUNTER — Encounter: Payer: Self-pay | Admitting: Vascular Surgery

## 2016-11-28 ENCOUNTER — Encounter (HOSPITAL_BASED_OUTPATIENT_CLINIC_OR_DEPARTMENT_OTHER): Payer: Medicare (Managed Care) | Attending: Internal Medicine

## 2016-11-28 VITALS — BP 115/62 | HR 70 | Temp 97.6°F | Resp 20 | Ht 73.0 in | Wt 175.0 lb

## 2016-11-28 DIAGNOSIS — L97512 Non-pressure chronic ulcer of other part of right foot with fat layer exposed: Secondary | ICD-10-CM | POA: Diagnosis not present

## 2016-11-28 DIAGNOSIS — L89893 Pressure ulcer of other site, stage 3: Secondary | ICD-10-CM | POA: Diagnosis not present

## 2016-11-28 DIAGNOSIS — Z89431 Acquired absence of right foot: Secondary | ICD-10-CM | POA: Diagnosis not present

## 2016-11-28 DIAGNOSIS — I739 Peripheral vascular disease, unspecified: Secondary | ICD-10-CM

## 2016-11-28 DIAGNOSIS — I70201 Unspecified atherosclerosis of native arteries of extremities, right leg: Secondary | ICD-10-CM | POA: Insufficient documentation

## 2016-11-28 DIAGNOSIS — E1142 Type 2 diabetes mellitus with diabetic polyneuropathy: Secondary | ICD-10-CM | POA: Diagnosis not present

## 2016-11-28 DIAGNOSIS — E11621 Type 2 diabetes mellitus with foot ulcer: Secondary | ICD-10-CM | POA: Insufficient documentation

## 2016-11-28 NOTE — Progress Notes (Signed)
Patient is a 71 year old male who returns today for postoperative follow-up. He underwent right femoral endarterectomy 10/30/2016. 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 He also has a [facility]. He does not complain of any drainage from his right groin. He is continuing to get to the wound center for his right transmetatarsal.  Physical exam:  Vitals:   11/28/16 0954  BP: 115/62  Pulse: 70  Resp: 20  Temp: 97.6 F (36.4 C)  TempSrc: Oral  SpO2: 97%  Weight: 175 lb (79.4 kg)  Height: 6\' 1"  (1.854 m)    Right groin well-healed no drainage no erythema  Extremities: Right transmetatarsal amputation with 2.0 x 1.5 cm open wound in the central section less than 1 mm diameter 100% granulation tissue  Assessment: Healing right transmetatarsal amputation wound. Doing well status post right femoral endarterectomy.  Plan: The patient will follow-up with our nurse practitioner in 6 months with repeat ABIs at that point. He will continue to follow-up the wound center for his transmetatarsal wound.  Fabienne Brunsharles Fields, MD [facility] [place] Office: [phone] Pager: [phone]

## 2016-12-12 DIAGNOSIS — E11621 Type 2 diabetes mellitus with foot ulcer: Secondary | ICD-10-CM | POA: Diagnosis not present

## 2016-12-13 NOTE — Addendum Note (Signed)
Addended by: Burton ApleyPETTY, Tara Rud A on: 12/13/2016 09:49 AM   Modules accepted: Orders

## 2016-12-26 ENCOUNTER — Encounter (HOSPITAL_BASED_OUTPATIENT_CLINIC_OR_DEPARTMENT_OTHER): Payer: Medicare (Managed Care) | Attending: Internal Medicine

## 2016-12-26 DIAGNOSIS — F0390 Unspecified dementia without behavioral disturbance: Secondary | ICD-10-CM | POA: Insufficient documentation

## 2016-12-26 DIAGNOSIS — Z89431 Acquired absence of right foot: Secondary | ICD-10-CM | POA: Insufficient documentation

## 2016-12-26 DIAGNOSIS — Z872 Personal history of diseases of the skin and subcutaneous tissue: Secondary | ICD-10-CM | POA: Insufficient documentation

## 2016-12-26 DIAGNOSIS — I10 Essential (primary) hypertension: Secondary | ICD-10-CM | POA: Insufficient documentation

## 2016-12-26 DIAGNOSIS — Z09 Encounter for follow-up examination after completed treatment for conditions other than malignant neoplasm: Secondary | ICD-10-CM | POA: Insufficient documentation

## 2016-12-26 DIAGNOSIS — E1140 Type 2 diabetes mellitus with diabetic neuropathy, unspecified: Secondary | ICD-10-CM | POA: Insufficient documentation

## 2016-12-26 DIAGNOSIS — I2510 Atherosclerotic heart disease of native coronary artery without angina pectoris: Secondary | ICD-10-CM | POA: Insufficient documentation

## 2017-01-02 DIAGNOSIS — Z89431 Acquired absence of right foot: Secondary | ICD-10-CM | POA: Diagnosis not present

## 2017-01-02 DIAGNOSIS — Z09 Encounter for follow-up examination after completed treatment for conditions other than malignant neoplasm: Secondary | ICD-10-CM | POA: Diagnosis not present

## 2017-01-02 DIAGNOSIS — Z872 Personal history of diseases of the skin and subcutaneous tissue: Secondary | ICD-10-CM | POA: Diagnosis not present

## 2017-01-02 DIAGNOSIS — F0390 Unspecified dementia without behavioral disturbance: Secondary | ICD-10-CM | POA: Diagnosis not present

## 2017-01-02 DIAGNOSIS — I10 Essential (primary) hypertension: Secondary | ICD-10-CM | POA: Diagnosis not present

## 2017-01-02 DIAGNOSIS — E1140 Type 2 diabetes mellitus with diabetic neuropathy, unspecified: Secondary | ICD-10-CM | POA: Diagnosis not present

## 2017-01-02 DIAGNOSIS — I2510 Atherosclerotic heart disease of native coronary artery without angina pectoris: Secondary | ICD-10-CM | POA: Diagnosis not present

## 2017-04-08 ENCOUNTER — Other Ambulatory Visit: Payer: Self-pay | Admitting: Internal Medicine

## 2017-04-08 ENCOUNTER — Ambulatory Visit
Admission: RE | Admit: 2017-04-08 | Discharge: 2017-04-08 | Disposition: A | Discharge status: Home / Self Care | Payer: Medicare (Managed Care) | Source: Ambulatory Visit | Attending: Internal Medicine | Admitting: Internal Medicine

## 2017-04-08 DIAGNOSIS — R52 Pain, unspecified: Secondary | ICD-10-CM

## 2017-06-05 ENCOUNTER — Ambulatory Visit: Payer: Medicare (Managed Care) | Admitting: Family

## 2017-06-05 ENCOUNTER — Encounter (HOSPITAL_COMMUNITY): Payer: Medicare (Managed Care)

## 2017-06-11 ENCOUNTER — Encounter (HOSPITAL_COMMUNITY): Payer: Self-pay | Admitting: Emergency Medicine

## 2017-06-11 ENCOUNTER — Inpatient Hospital Stay (HOSPITAL_COMMUNITY)
Admission: EM | Admit: 2017-06-11 | Discharge: 2017-06-23 | DRG: 853 | Disposition: A | Discharge status: Skilled Nursing Facility | Payer: Medicare (Managed Care) | Attending: Oncology | Admitting: Oncology

## 2017-06-11 ENCOUNTER — Other Ambulatory Visit: Payer: Self-pay

## 2017-06-11 ENCOUNTER — Emergency Department (HOSPITAL_COMMUNITY): Payer: Medicare (Managed Care)

## 2017-06-11 DIAGNOSIS — F1011 Alcohol abuse, in remission: Secondary | ICD-10-CM | POA: Diagnosis present

## 2017-06-11 DIAGNOSIS — Z794 Long term (current) use of insulin: Secondary | ICD-10-CM | POA: Diagnosis not present

## 2017-06-11 DIAGNOSIS — Z955 Presence of coronary angioplasty implant and graft: Secondary | ICD-10-CM

## 2017-06-11 DIAGNOSIS — R7989 Other specified abnormal findings of blood chemistry: Secondary | ICD-10-CM | POA: Diagnosis not present

## 2017-06-11 DIAGNOSIS — D649 Anemia, unspecified: Secondary | ICD-10-CM | POA: Diagnosis not present

## 2017-06-11 DIAGNOSIS — Z66 Do not resuscitate: Secondary | ICD-10-CM | POA: Diagnosis not present

## 2017-06-11 DIAGNOSIS — Z89512 Acquired absence of left leg below knee: Secondary | ICD-10-CM

## 2017-06-11 DIAGNOSIS — E1110 Type 2 diabetes mellitus with ketoacidosis without coma: Secondary | ICD-10-CM | POA: Diagnosis present

## 2017-06-11 DIAGNOSIS — B3749 Other urogenital candidiasis: Secondary | ICD-10-CM | POA: Diagnosis present

## 2017-06-11 DIAGNOSIS — N401 Enlarged prostate with lower urinary tract symptoms: Secondary | ICD-10-CM | POA: Diagnosis present

## 2017-06-11 DIAGNOSIS — A411 Sepsis due to other specified staphylococcus: Principal | ICD-10-CM | POA: Diagnosis present

## 2017-06-11 DIAGNOSIS — Z95828 Presence of other vascular implants and grafts: Secondary | ICD-10-CM | POA: Diagnosis not present

## 2017-06-11 DIAGNOSIS — Z89421 Acquired absence of other right toe(s): Secondary | ICD-10-CM | POA: Diagnosis not present

## 2017-06-11 DIAGNOSIS — E119 Type 2 diabetes mellitus without complications: Secondary | ICD-10-CM | POA: Diagnosis not present

## 2017-06-11 DIAGNOSIS — R7881 Bacteremia: Secondary | ICD-10-CM | POA: Diagnosis not present

## 2017-06-11 DIAGNOSIS — N131 Hydronephrosis with ureteral stricture, not elsewhere classified: Secondary | ICD-10-CM | POA: Diagnosis present

## 2017-06-11 DIAGNOSIS — E11649 Type 2 diabetes mellitus with hypoglycemia without coma: Secondary | ICD-10-CM | POA: Diagnosis not present

## 2017-06-11 DIAGNOSIS — R69 Illness, unspecified: Secondary | ICD-10-CM

## 2017-06-11 DIAGNOSIS — I2510 Atherosclerotic heart disease of native coronary artery without angina pectoris: Secondary | ICD-10-CM | POA: Diagnosis present

## 2017-06-11 DIAGNOSIS — N179 Acute kidney failure, unspecified: Secondary | ICD-10-CM | POA: Diagnosis not present

## 2017-06-11 DIAGNOSIS — J9811 Atelectasis: Secondary | ICD-10-CM | POA: Diagnosis present

## 2017-06-11 DIAGNOSIS — R109 Unspecified abdominal pain: Secondary | ICD-10-CM | POA: Diagnosis not present

## 2017-06-11 DIAGNOSIS — F0150 Vascular dementia without behavioral disturbance: Secondary | ICD-10-CM | POA: Diagnosis present

## 2017-06-11 DIAGNOSIS — Z87891 Personal history of nicotine dependence: Secondary | ICD-10-CM

## 2017-06-11 DIAGNOSIS — Z8249 Family history of ischemic heart disease and other diseases of the circulatory system: Secondary | ICD-10-CM

## 2017-06-11 DIAGNOSIS — R1310 Dysphagia, unspecified: Secondary | ICD-10-CM | POA: Diagnosis present

## 2017-06-11 DIAGNOSIS — N2889 Other specified disorders of kidney and ureter: Secondary | ICD-10-CM

## 2017-06-11 DIAGNOSIS — R4182 Altered mental status, unspecified: Secondary | ICD-10-CM | POA: Diagnosis present

## 2017-06-11 DIAGNOSIS — E873 Alkalosis: Secondary | ICD-10-CM | POA: Diagnosis not present

## 2017-06-11 DIAGNOSIS — Z8673 Personal history of transient ischemic attack (TIA), and cerebral infarction without residual deficits: Secondary | ICD-10-CM

## 2017-06-11 DIAGNOSIS — B377 Candidal sepsis: Secondary | ICD-10-CM | POA: Diagnosis not present

## 2017-06-11 DIAGNOSIS — Z89519 Acquired absence of unspecified leg below knee: Secondary | ICD-10-CM | POA: Diagnosis not present

## 2017-06-11 DIAGNOSIS — B370 Candidal stomatitis: Secondary | ICD-10-CM | POA: Diagnosis not present

## 2017-06-11 DIAGNOSIS — G9341 Metabolic encephalopathy: Secondary | ICD-10-CM | POA: Diagnosis present

## 2017-06-11 DIAGNOSIS — N202 Calculus of kidney with calculus of ureter: Secondary | ICD-10-CM | POA: Diagnosis present

## 2017-06-11 DIAGNOSIS — Z89431 Acquired absence of right foot: Secondary | ICD-10-CM

## 2017-06-11 DIAGNOSIS — Z833 Family history of diabetes mellitus: Secondary | ICD-10-CM

## 2017-06-11 DIAGNOSIS — N138 Other obstructive and reflux uropathy: Secondary | ICD-10-CM | POA: Diagnosis present

## 2017-06-11 DIAGNOSIS — R197 Diarrhea, unspecified: Secondary | ICD-10-CM | POA: Diagnosis not present

## 2017-06-11 DIAGNOSIS — Z936 Other artificial openings of urinary tract status: Secondary | ICD-10-CM | POA: Diagnosis not present

## 2017-06-11 DIAGNOSIS — B379 Candidiasis, unspecified: Secondary | ICD-10-CM | POA: Diagnosis present

## 2017-06-11 DIAGNOSIS — Z880 Allergy status to penicillin: Secondary | ICD-10-CM

## 2017-06-11 DIAGNOSIS — A419 Sepsis, unspecified organism: Secondary | ICD-10-CM | POA: Diagnosis not present

## 2017-06-11 DIAGNOSIS — E1151 Type 2 diabetes mellitus with diabetic peripheral angiopathy without gangrene: Secondary | ICD-10-CM | POA: Diagnosis present

## 2017-06-11 DIAGNOSIS — I252 Old myocardial infarction: Secondary | ICD-10-CM

## 2017-06-11 DIAGNOSIS — B957 Other staphylococcus as the cause of diseases classified elsewhere: Secondary | ICD-10-CM | POA: Diagnosis not present

## 2017-06-11 DIAGNOSIS — N111 Chronic obstructive pyelonephritis: Secondary | ICD-10-CM | POA: Diagnosis not present

## 2017-06-11 DIAGNOSIS — N200 Calculus of kidney: Secondary | ICD-10-CM | POA: Diagnosis not present

## 2017-06-11 DIAGNOSIS — A412 Sepsis due to unspecified staphylococcus: Secondary | ICD-10-CM | POA: Diagnosis not present

## 2017-06-11 DIAGNOSIS — E876 Hypokalemia: Secondary | ICD-10-CM | POA: Diagnosis present

## 2017-06-11 DIAGNOSIS — M1990 Unspecified osteoarthritis, unspecified site: Secondary | ICD-10-CM | POA: Diagnosis present

## 2017-06-11 DIAGNOSIS — Z79899 Other long term (current) drug therapy: Secondary | ICD-10-CM

## 2017-06-11 DIAGNOSIS — M25511 Pain in right shoulder: Secondary | ICD-10-CM | POA: Diagnosis not present

## 2017-06-11 DIAGNOSIS — A4102 Sepsis due to Methicillin resistant Staphylococcus aureus: Secondary | ICD-10-CM | POA: Diagnosis not present

## 2017-06-11 DIAGNOSIS — R410 Disorientation, unspecified: Secondary | ICD-10-CM

## 2017-06-11 DIAGNOSIS — R32 Unspecified urinary incontinence: Secondary | ICD-10-CM | POA: Diagnosis present

## 2017-06-11 DIAGNOSIS — M549 Dorsalgia, unspecified: Secondary | ICD-10-CM | POA: Diagnosis not present

## 2017-06-11 DIAGNOSIS — B49 Unspecified mycosis: Secondary | ICD-10-CM | POA: Diagnosis not present

## 2017-06-11 DIAGNOSIS — IMO0002 Reserved for concepts with insufficient information to code with codable children: Secondary | ICD-10-CM

## 2017-06-11 DIAGNOSIS — R159 Full incontinence of feces: Secondary | ICD-10-CM | POA: Diagnosis present

## 2017-06-11 DIAGNOSIS — I4581 Long QT syndrome: Secondary | ICD-10-CM | POA: Diagnosis present

## 2017-06-11 DIAGNOSIS — F0390 Unspecified dementia without behavioral disturbance: Secondary | ICD-10-CM | POA: Diagnosis not present

## 2017-06-11 DIAGNOSIS — A4902 Methicillin resistant Staphylococcus aureus infection, unspecified site: Secondary | ICD-10-CM | POA: Diagnosis not present

## 2017-06-11 DIAGNOSIS — Z89411 Acquired absence of right great toe: Secondary | ICD-10-CM | POA: Diagnosis not present

## 2017-06-11 DIAGNOSIS — Z7982 Long term (current) use of aspirin: Secondary | ICD-10-CM

## 2017-06-11 DIAGNOSIS — M270 Developmental disorders of jaws: Secondary | ICD-10-CM | POA: Diagnosis not present

## 2017-06-11 DIAGNOSIS — N132 Hydronephrosis with renal and ureteral calculous obstruction: Secondary | ICD-10-CM | POA: Diagnosis not present

## 2017-06-11 LAB — CBC WITH DIFFERENTIAL/PLATELET
Basophils Absolute: 0 10*3/uL (ref 0.0–0.1)
Basophils Relative: 0 %
Eosinophils Absolute: 0 10*3/uL (ref 0.0–0.7)
Eosinophils Relative: 0 %
HEMATOCRIT: 41.6 % (ref 39.0–52.0)
HEMOGLOBIN: 13.8 g/dL (ref 13.0–17.0)
LYMPHS ABS: 1.5 10*3/uL (ref 0.7–4.0)
LYMPHS PCT: 11 %
MCH: 30.9 pg (ref 26.0–34.0)
MCHC: 33.2 g/dL (ref 30.0–36.0)
MCV: 93.1 fL (ref 78.0–100.0)
Monocytes Absolute: 1.2 10*3/uL — ABNORMAL HIGH (ref 0.1–1.0)
Monocytes Relative: 9 %
NEUTROS ABS: 10.8 10*3/uL — AB (ref 1.7–7.7)
Neutrophils Relative %: 80 %
PLATELETS: 292 10*3/uL (ref 150–400)
RBC: 4.47 MIL/uL (ref 4.22–5.81)
RDW: 12.8 % (ref 11.5–15.5)
WBC: 13.6 10*3/uL — AB (ref 4.0–10.5)

## 2017-06-11 LAB — URINALYSIS, ROUTINE W REFLEX MICROSCOPIC
BILIRUBIN URINE: NEGATIVE
KETONES UR: 20 mg/dL — AB
NITRITE: NEGATIVE
PH: 5 (ref 5.0–8.0)
PROTEIN: 100 mg/dL — AB
Specific Gravity, Urine: 1.026 (ref 1.005–1.030)

## 2017-06-11 LAB — I-STAT CG4 LACTIC ACID, ED
LACTIC ACID, VENOUS: 3.51 mmol/L — AB (ref 0.5–1.9)
Lactic Acid, Venous: 2.51 mmol/L (ref 0.5–1.9)
Lactic Acid, Venous: 1.64 mmol/L (ref 0.5–1.9)

## 2017-06-11 LAB — COMPREHENSIVE METABOLIC PANEL
ALBUMIN: 3.6 g/dL (ref 3.5–5.0)
ALT: 20 U/L (ref 17–63)
AST: 27 U/L (ref 15–41)
Alkaline Phosphatase: 69 U/L (ref 38–126)
Anion gap: 15 (ref 5–15)
BILIRUBIN TOTAL: 1 mg/dL (ref 0.3–1.2)
BUN: 35 mg/dL — AB (ref 6–20)
CHLORIDE: 102 mmol/L (ref 101–111)
CO2: 21 mmol/L — ABNORMAL LOW (ref 22–32)
CREATININE: 1.69 mg/dL — AB (ref 0.61–1.24)
Calcium: 9.7 mg/dL (ref 8.9–10.3)
GFR calc Af Amer: 46 mL/min — ABNORMAL LOW (ref >60)
GFR, EST NON AFRICAN AMERICAN: 40 mL/min — AB (ref >60)
GLUCOSE: 323 mg/dL — AB (ref 65–99)
Potassium: 3.7 mmol/L (ref 3.5–5.1)
Sodium: 138 mmol/L (ref 135–145)
Total Protein: 8.7 g/dL — ABNORMAL HIGH (ref 6.5–8.1)

## 2017-06-11 LAB — I-STAT CHEM 8, ED
BUN: 44 mg/dL — ABNORMAL HIGH (ref 6–20)
CALCIUM ION: 0.97 mmol/L — AB (ref 1.15–1.40)
CREATININE: 1.4 mg/dL — AB (ref 0.61–1.24)
Chloride: 107 mmol/L (ref 101–111)
GLUCOSE: 341 mg/dL — AB (ref 65–99)
HCT: 42 % (ref 39.0–52.0)
HEMOGLOBIN: 14.3 g/dL (ref 13.0–17.0)
Potassium: 4.4 mmol/L (ref 3.5–5.1)
Sodium: 142 mmol/L (ref 135–145)
TCO2: 23 mmol/L (ref 22–32)

## 2017-06-11 LAB — PROTIME-INR
INR: 1.08
Prothrombin Time: 13.9 seconds (ref 11.4–15.2)

## 2017-06-11 LAB — BASIC METABOLIC PANEL
Anion gap: 12 (ref 5–15)
BUN: 34 mg/dL — ABNORMAL HIGH (ref 6–20)
CALCIUM: 8.2 mg/dL — AB (ref 8.9–10.3)
CO2: 22 mmol/L (ref 22–32)
CREATININE: 1.32 mg/dL — AB (ref 0.61–1.24)
Chloride: 110 mmol/L (ref 101–111)
GFR calc non Af Amer: 53 mL/min — ABNORMAL LOW (ref >60)
GLUCOSE: 288 mg/dL — AB (ref 65–99)
Potassium: 3.6 mmol/L (ref 3.5–5.1)
Sodium: 144 mmol/L (ref 135–145)

## 2017-06-11 LAB — GLUCOSE, CAPILLARY: Glucose-Capillary: 247 mg/dL — ABNORMAL HIGH (ref 65–99)

## 2017-06-11 LAB — CBG MONITORING, ED: GLUCOSE-CAPILLARY: 283 mg/dL — AB (ref 65–99)

## 2017-06-11 LAB — INFLUENZA PANEL BY PCR (TYPE A & B)
INFLAPCR: NEGATIVE
Influenza B By PCR: NEGATIVE

## 2017-06-11 MED ORDER — SODIUM CHLORIDE 0.9 % IV BOLUS (SEPSIS)
1000.0000 mL | Freq: Once | INTRAVENOUS | Status: AC
  Administered 2017-06-11: 1000 mL via INTRAVENOUS

## 2017-06-11 MED ORDER — SODIUM CHLORIDE 0.9 % IV SOLN
INTRAVENOUS | Status: DC
  Administered 2017-06-11: 18:00:00 via INTRAVENOUS

## 2017-06-11 MED ORDER — INSULIN ASPART 100 UNIT/ML ~~LOC~~ SOLN
0.0000 [IU] | Freq: Three times a day (TID) | SUBCUTANEOUS | Status: DC
  Administered 2017-06-11: 5 [IU] via SUBCUTANEOUS
  Administered 2017-06-12: 7 [IU] via SUBCUTANEOUS
  Administered 2017-06-12 (×2): 5 [IU] via SUBCUTANEOUS
  Administered 2017-06-13 (×2): 7 [IU] via SUBCUTANEOUS
  Administered 2017-06-13: 5 [IU] via SUBCUTANEOUS
  Administered 2017-06-14: 7 [IU] via SUBCUTANEOUS
  Administered 2017-06-14 – 2017-06-15 (×3): 5 [IU] via SUBCUTANEOUS
  Filled 2017-06-11: qty 1

## 2017-06-11 MED ORDER — PIPERACILLIN-TAZOBACTAM 3.375 G IVPB 30 MIN
3.3750 g | Freq: Once | INTRAVENOUS | Status: AC
  Administered 2017-06-11: 3.375 g via INTRAVENOUS
  Filled 2017-06-11: qty 50

## 2017-06-11 MED ORDER — ACETAMINOPHEN 650 MG RE SUPP
650.0000 mg | Freq: Once | RECTAL | Status: AC
  Administered 2017-06-11: 650 mg via RECTAL
  Filled 2017-06-11: qty 1

## 2017-06-11 MED ORDER — HEPARIN SODIUM (PORCINE) 5000 UNIT/ML IJ SOLN
5000.0000 [IU] | Freq: Three times a day (TID) | INTRAMUSCULAR | Status: DC
  Administered 2017-06-11 – 2017-06-17 (×17): 5000 [IU] via SUBCUTANEOUS
  Filled 2017-06-11 (×17): qty 1

## 2017-06-11 MED ORDER — VANCOMYCIN HCL 10 G IV SOLR
1500.0000 mg | Freq: Once | INTRAVENOUS | Status: AC
  Administered 2017-06-11: 1500 mg via INTRAVENOUS
  Filled 2017-06-11: qty 1500

## 2017-06-11 MED ORDER — DEXTROSE 5 % IV SOLN
2.0000 g | INTRAVENOUS | Status: DC
  Administered 2017-06-11: 2 g via INTRAVENOUS
  Filled 2017-06-11: qty 2

## 2017-06-11 MED ORDER — ACETAMINOPHEN 650 MG RE SUPP
650.0000 mg | Freq: Four times a day (QID) | RECTAL | Status: DC | PRN
  Administered 2017-06-11 – 2017-06-12 (×5): 650 mg via RECTAL
  Filled 2017-06-11 (×5): qty 1

## 2017-06-11 MED ORDER — INSULIN ASPART 100 UNIT/ML ~~LOC~~ SOLN
5.0000 [IU] | Freq: Once | SUBCUTANEOUS | Status: AC
  Administered 2017-06-11: 5 [IU] via SUBCUTANEOUS

## 2017-06-11 MED ORDER — INSULIN GLARGINE 100 UNIT/ML ~~LOC~~ SOLN
20.0000 [IU] | Freq: Every day | SUBCUTANEOUS | Status: DC
  Administered 2017-06-11 – 2017-06-12 (×2): 20 [IU] via SUBCUTANEOUS
  Filled 2017-06-11 (×3): qty 0.2

## 2017-06-11 MED ORDER — ACETAMINOPHEN 325 MG PO TABS: 650.0000 mg | ORAL_TABLET | Freq: Four times a day (QID) | ORAL | Status: DC | PRN

## 2017-06-11 MED ORDER — SENNOSIDES-DOCUSATE SODIUM 8.6-50 MG PO TABS: 1.0000 | ORAL_TABLET | Freq: Every evening | ORAL | Status: DC | PRN

## 2017-06-11 MED ORDER — FLUCONAZOLE IN SODIUM CHLORIDE 200-0.9 MG/100ML-% IV SOLN
200.0000 mg | INTRAVENOUS | Status: DC
  Administered 2017-06-11: 200 mg via INTRAVENOUS
  Filled 2017-06-11 (×2): qty 100

## 2017-06-11 MED ORDER — VANCOMYCIN HCL IN DEXTROSE 750-5 MG/150ML-% IV SOLN
750.0000 mg | Freq: Two times a day (BID) | INTRAVENOUS | Status: DC
  Administered 2017-06-12 – 2017-06-13 (×3): 750 mg via INTRAVENOUS
  Filled 2017-06-11 (×5): qty 150

## 2017-06-11 MED ORDER — PIPERACILLIN-TAZOBACTAM 3.375 G IVPB: 3.3750 g | Freq: Three times a day (TID) | INTRAVENOUS | Status: DC

## 2017-06-11 MED ORDER — SODIUM CHLORIDE 0.9 % IV BOLUS (SEPSIS)
500.0000 mL | Freq: Once | INTRAVENOUS | Status: AC
  Administered 2017-06-11: 500 mL via INTRAVENOUS

## 2017-06-11 NOTE — ED Notes (Signed)
CBG 283 

## 2017-06-11 NOTE — ED Notes (Addendum)
Pt is now talking and alert. Color better.

## 2017-06-11 NOTE — H&P (Signed)
Date: 06/11/2017               Patient Name:  Margaretha SeedsGlenn A Full Jr. MRN: [id number]  DOB: 1946/02/17 Age / Sex: 71 y.o., male   PCP: Miguel AschoffWilliams, Julie Anne, MD         Medical Service: Internal Medicine Teaching Service         Attending Physician: Dr. Arby BarrettePfeiffer, Marcy, MD    First Contact: Dr. Crista ElliotHarbrecht Pager: [phone]  Second Contact: Dr. Mikey BussingHoffman Pager: [phone]       After Hours (After 5p/  First Contact Pager: [phone]  weekends / holidays): Second Contact Pager: [phone]   Chief Complaint: sepsis  History of Present Illness:  Mr. 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 Wife was present at bedside, who provided the history.  Mr. Willeen CassBennett was not able to communicate with us. He was nonverbal, and thus history was obtained by his wife. Wife reports that since 12/17, patient has been "out of it". She reports that he hasn't been feeling well and hasn't been acting like himself. She also states that he just stopped communicating and would stare blankly at her. He has also had decreased PO intake for the last couple days. She states that he has had trouble swallowing the last couple days. She also thinks that the anterior portion of his neck is more swollen than normal. He is often incontinent of urine and feces, which wife states has been worse recently.   He has a wound on his right foot that is regularly dressed. Wife saw this wound two days ago and reports that it had its typical appearance. She states that he had a raw reddish appearance on his sacrum but no openings that she could identify.  At baseline, he is not able to ambulate. He uses a wheelchair. He normally is conversational. Wife is his primary caregiver but she has assistance from a home health aide who comes for a couple hours 2 days a week. [facility]  [facility] "normal urine test", per the wife.   Denies current smoking, alcohol, or other illicit drug use.  ED Course: - BP 154/81, HR 89, RR 28, temp 104.2 -> 101.7, O2 96% on RA - WBC 13.6, Cr 1.40, lactic acid 3.51 -> 2.51 -> 1.64. UA with >/= 500 glucose and UTI. Flu negative. - CXR with mild bibasilar atelectasis and negative for PNA. EKG with sinus tachycardia and prolonged QT interval. - Started on vanc/zosyn. Received 3L IV NS bolus.  Meds:  Current Meds  Medication Sig  . acetaminophen (TYLENOL) 650 MG CR tablet Take 650 mg by mouth 3 (three) times daily.   Marland Kitchen. aspirin EC 81 MG tablet Take 81 mg by mouth daily.  Marland Kitchen. atorvastatin (LIPITOR) 10 MG tablet Take 10 mg by mouth every evening.  . insulin glargine (LANTUS) 100 UNIT/ML injection Inject 40 Units into the skin at bedtime.   . lidocaine (LIDODERM) 5 % Place 1 patch onto the skin daily. Remove & Discard patch within 12 hours or as directed by MD  . LORazepam (ATIVAN) 1 MG tablet Take 1 mg by mouth 2 (two) times daily.  . Melatonin 10 MG TABS Take 10 mg by mouth at bedtime.  . metFORMIN (GLUCOPHAGE) 500 MG tablet Take  500 mg by mouth 2 (two) times daily with a meal.  . sertraline (ZOLOFT) 50 MG tablet Take 50 mg by mouth 2 (two) times daily.   . tamsulosin (FLOMAX) 0.4 MG CAPS capsule Take 0.4 mg by mouth every evening.  . traZODone (DESYREL) 50 MG tablet TAKE ONE TABLET (50 MG TOTAL) BY MOUTH AT BEDTIME.   Allergies: Allergies as of 06/11/2017 - Review Complete 06/11/2017  Allergen Reaction Noted  . Penicillins Rash 10/30/2010   Past Medical History:  Diagnosis Date  . Anemia   . Arthritis   . Dementia   . Diabetes mellitus without complication (HCC)   . ETOH abuse   . Myocardial infarction (HCC)   . Peripheral arterial disease (HCC)   . Stroke (HCC)   . Wears dentures   . Wears glasses    Family History:  Family History  Problem Relation Age of Onset  . Diabetes Mother   .  Heart disease Father   . Heart disease Brother    Social History:  Social History   Socioeconomic History  . Marital status: Married    Spouse name: None  . Number of children: None  . Years of education: None  . Highest education level: None  Social Needs  . Financial resource strain: None  . Food insecurity - worry: None  . Food insecurity - inability: None  . Transportation needs - medical: None  . Transportation needs - non-medical: None  Occupational History  . None  Tobacco Use  . Smoking status: Former Smoker    Types: Cigarettes  . Smokeless tobacco: Never Used  . Tobacco comment: Quit smoking cigarettes 10/ 2017  Substance and Sexual Activity  . Alcohol use: No    Comment: former  . Drug use: No  . Sexual activity: None  Other Topics Concern  . None  Social History Narrative  . None   Review of Systems: - Fractured left wrist Unable to obtain secondary to AMS.  Physical Exam: Blood pressure 139/80, pulse 88, temperature (!) 101.7 F (38.7 C), temperature source Oral, resp. rate (!) 23, weight 175 lb (79.4 kg), SpO2 96 %.  GEN: Elderly male with no teeth. Alert. No acute distress. States his name is "Nadine CountsBob", but will not respond to any other questions otherwise. Occasionally follows commands. HENT: [place]/AT. Moist mucous membranes. No visible lesions. EYES: PERRL. Sclera non-icteric. Conjunctiva clear. NECK: He does have slack in his lower jaw which causes his anterior neck to appear to be protruding, however I do not appreciate any palpable masses. RESP: Clear to auscultation bilaterally. No wheezes, rales, or rhonchi. No increased work of breathing. CV: Normal rate and regular rhythm. No murmurs, gallops, or rubs. No LE edema. ABD: Soft. Non-tender. Non-distended. Normoactive bowel sounds. EXT: No edema. Warm. L BKA with well-healed surgical site. R transmetatarsal amputation with clean bandage. NEURO: Grip strength 3/5 on right. 1/5 on left 2/2 fractured  wrist. Unable to assess cranial nerves or other elements of neurologic exam. Patient not following commands. Nonverbal. PSYCH: Patient is calm. Nonverbal  EKG: personally reviewed my interpretation is sinus tachycardia. t-wave inversions in inferior and lateral leads, ?RA enlargement  CXR: personally reviewed my interpretation is bibasilar atelectasis  Labs CBC Latest Ref Rng & Units 06/11/2017 06/11/2017 10/31/2016  WBC 4.0 - 10.5 K/uL 13.6(H) - 7.4  Hemoglobin 13.0 - 17.0 g/dL 16.113.8 09.614.3 10.3(L)  Hematocrit 39.0 - 52.0 % 41.6 42.0 32.2(L)  Platelets 150 - 400 K/uL 292 - 164   CMP Latest  Ref Rng & Units 06/11/2017 06/11/2017 10/31/2016  Glucose 65 - 99 mg/dL 161(W) 960(A) 540(J)  BUN 6 - 20 mg/dL 81(X) 91(Y) 11  Creatinine 0.61 - 1.24 mg/dL 7.82(N) 5.62(Z) 3.08  Sodium 135 - 145 mmol/L 138 142 136  Potassium 3.5 - 5.1 mmol/L 3.7 4.4 4.1  Chloride 101 - 111 mmol/L 102 107 101  CO2 22 - 32 mmol/L 21(L) - 27  Calcium 8.9 - 10.3 mg/dL 9.7 - 8.5(L)  Total Protein 6.5 - 8.1 g/dL 6.5(H) - -  Total Bilirubin 0.3 - 1.2 mg/dL 1.0 - -  Alkaline Phos 38 - 126 U/L 69 - -  AST 15 - 41 U/L 27 - -  ALT 17 - 63 U/L 20 - -   Lactic acid 3.51 -> 2.51 -> 1.64 Flu negative INR 1.08 BCx pending UA with glucose >/= 500, moderate Hgb, ketones 20, protein 100, small leukocytes, many bacteria, budding yeast present  Assessment & Plan by Problem:  Mr. Grammatico is a 71yo male P[facility] patient with PMH significant for DM, CAD, dementia, hx of CVA, PAD s/p L BKA, and alcohol use disorder who presents with sepsis.  Sepsis WBC 13.6, RR 33, HR 88-128, temperature 104.2 on admission. UA with many bacteria, small leukocytes, and positive budding yeast. Patient unable to verbalize whether he has urinary symptoms. Given patient's AMS, will treat for candiduria and UTI. No known skin wounds or ulcers. CXR without evidence of consolidation. Lactic acid initially elevated to 3.51, now resolved after 2.5L IVF. Vanc/zosyn  initiated in the ED. Influenza negative. - Telemetry - Will change to vanc/cefepime due to AKI - IV Fluconazole 200mg  daily for 2 weeks - F/u BCx, UCx - Droplet precautions - RVP - CBC, BMET in AM - IVF NS 152mL/hr  Mild DKA, DM Glucose 343 with mildly low bicarb at 21 and AG of 15. UA positive for glucose and ketones. S/p 2.5L in ED. A1c in Feb 2018 was 8.5. Home regimen includes metformin 500mg  BID and lantus 40u QHS - IVF 189mL/hr NS - Lantus 20u QHS - Sensitive SSI TID with meals - Repeat BMET this afternoon - BMET in AM  Altered mental status In setting of dementia. Patient is not communicating, which is different from his baseline according to his wife. He was able to tell us his name, but otherwise was nonverbal. Unclear etiology. Possibly related to sepsis or mild DKA, however this is not clear. - Management of sepsis and DKA as above - Speech swallow eval - NPO until after swallow eval  AKI Cr 1.69 on admission, baseline from May 2018 is 0.5-0.7. Likely pre-renal secondary to dehydration in setting of decreased PO intake. S/p 2.5L IVF. - IVF NS 158mL/hr - BMET in AM  Diet: NPO until after swallow eval VTE PPx: SQH Code Status: DNR/DNI Dispo: Admit patient to Inpatient with expected length of stay greater than 2 midnights.  Signed: Scherrie Gerlach, MD 06/11/2017, 2:58 PM  Pager: Demetrius Charity [phone]

## 2017-06-11 NOTE — Progress Notes (Signed)
Pharmacy Antibiotic Note Alan SeedsGlenn A Wiechmann Jr. is a 71 y.o. male admitted on 06/11/2017 with malaise. Starting broad spectrum antibiotics for sepsis.  His LA is 3.5, also has a slight bump in his SCr, baseline appears to be 0.5-0.7, currently 1.4, eCrCl ~ 55 ml/min.  Initially patient was start on Zosyn and vancomycin but to transition to cefepime and vancomycin for further treatment.    Plan: -Continue vancomycin 750 mg IV q12h as previously ordered  -Cefepime 2 grams IV every 24 hours  -BMP in am to assess renal fxn   Weight: 175 lb (79.4 kg)  Temp (24hrs), Avg:102.9 F (39.4 C), Min:101.7 F (38.7 C), Max:104.2 F (40.1 C)  Recent Labs  Lab 06/11/17 1036 06/11/17 1042 06/11/17 1147 06/11/17 1421  WBC  --  13.6*  --   --   CREATININE 1.40* 1.69*  --   --   LATICACIDVEN 3.51*  --  2.51* 1.64    CrCl cannot be calculated (Unknown ideal weight.).    Allergies  Allergen Reactions  . Penicillins Rash    Has patient had a PCN reaction causing immediate rash, facial/tongue/throat swelling, SOB or lightheadedness with hypotension: Yes Has patient had a PCN reaction causing severe rash involving mucus membranes or skin necrosis: No Has patient had a PCN reaction that required hospitalization No Has patient had a PCN reaction occurring within the last 10 years: No If all of the above answers are "NO", then may proceed with Cephalosporin use.     Antimicrobials this admission:  12/19 Cefepime >>  12/19 vancomycin >>  12/19 zosyn x 1 dose   Microbiology results: 12/19 blood cx:  Pollyann SamplesAndy Gavriela Cashin, PharmD, BCPS 06/11/2017, 5:42 PM

## 2017-06-11 NOTE — Progress Notes (Signed)
Pharmacy Antibiotic Note  Alan SeedsGlenn A Mccarthy Jr. is a 71 y.o. male admitted on 06/11/2017 with malaise. Starting broad spectrum antibiotics for sepsis.  His LA is 3.5, also has a slight bump in his SCr, baseline appears to be 0.5-0.7, currently 1.4, eCrCl ~ 55 ml/min.  Pharmacy was present for this code sepsis. I spoke with the wife regarding his penicillin allergy. She states that this was from his childhood and tells me that he has tolerated amoxicillin previously.    Plan: -Vancomycin 1500 mg IV x1 then 750 mg IV q12h -Zosyn 3.375 g IV q8h -Monitor renal fx, cultures, VT as needed   Weight: 175 lb (79.4 kg)  Temp (24hrs), Avg:104.2 F (40.1 C), Min:104.2 F (40.1 C), Max:104.2 F (40.1 C)  Recent Labs  Lab 06/11/17 1036 06/11/17 1042  WBC  --  13.6*  CREATININE 1.40*  --   LATICACIDVEN 3.51*  --     CrCl cannot be calculated (Unknown ideal weight.).    Allergies  Allergen Reactions  . Penicillins Rash    Has patient had a PCN reaction causing immediate rash, facial/tongue/throat swelling, SOB or lightheadedness with hypotension: Yes Has patient had a PCN reaction causing severe rash involving mucus membranes or skin necrosis: No Has patient had a PCN reaction that required hospitalization No Has patient had a PCN reaction occurring within the last 10 years: No If all of the above answers are "NO", then may proceed with Cephalosporin use.     Antimicrobials this admission: 12/19 vancomycin > 12/19 zosyn >  Dose adjustments this admission: N/A  Microbiology results: 12/19 blood cx:  Alan [name], Alan [name] 06/11/2017 11:41 AM

## 2017-06-11 NOTE — ED Notes (Signed)
Patient's eyes open and wife reports more alert now; mouth swabbed- very dry. Swabs left for wife to continue for comfort.

## 2017-06-11 NOTE — ED Provider Notes (Addendum)
[facility] EMERGENCY DEPARTMENT Provider Note   CSN: [id number] Arrival date & time: 06/11/17  7829     History   Chief Complaint Chief Complaint  Patient presents with  . Altered Mental Status  . Code Sepsis    HPI Alan Mapel. is a 71 y.o. male.  HPI Patient's wife reports of her couple of days the patient has been less interactive.  At first she thought that he may be just was not feeling well or was upset with her because he was going to have to go into a nursing home for a few weeks while she got a shoulder surgery.  The patient has multiple medical problems and is highly dependent for care.  She reports that he however seemed very different today.  He was confused and poorly responsive.  High fever identified today.  She reports he always has some amount of coughing and has not noticed it to be much worse than usual.  [facility] Past Medical History:  Diagnosis Date  . Anemia   . Arthritis   . Dementia   . Diabetes mellitus without complication (HCC)   . ETOH abuse   . Myocardial infarction (HCC)   . Peripheral arterial disease (HCC)   . Stroke (HCC)   . Wears dentures   . Wears glasses     Patient Active Problem List   Diagnosis Date Noted  . 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 ABDOMINAL AORTOGRAM N/A 10/25/2016   Procedure: Abdominal Aortogram;  Surgeon: Sherren Kerns, MD;  Location: [facility] INVASIVE CV LAB;  Service: Cardiovascular;  Laterality: N/A;  . BRAIN SURGERY    . CORONARY STENT PLACEMENT     " about 25 years ago (10/29/16)  . ENDARTERECTOMY FEMORAL Right 10/30/2016   Procedure: Eduardo Osier WITH PATCH GRAFT;  Surgeon: Sherren Kerns, MD;  Location: [facility] OR;  Service: Vascular;  Laterality: Right;  . LOWER EXTREMITY ANGIOGRAPHY Right 10/25/2016   Procedure: Lower Extremity Angiography;  Surgeon: Sherren Kerns, MD;   Location: [facility] INVASIVE CV LAB;  Service: Cardiovascular;  Laterality: Right;       Home Medications    Prior to Admission medications   Medication Sig Start Date End Date Taking? Authorizing Provider  acetaminophen (TYLENOL) 650 MG CR tablet Take 650 mg by mouth 3 (three) times daily.    Yes [provider]  aspirin EC 81 MG tablet Take 81 mg by mouth daily.   Yes [provider]  atorvastatin (LIPITOR) 10 MG tablet Take 10 mg by mouth every evening.   Yes [provider]  insulin glargine (LANTUS) 100 UNIT/ML injection Inject 40 Units into the skin at bedtime.  06/11/16  Yes [provider]  lidocaine (LIDODERM) 5 % Place 1 patch onto the skin daily. Remove & Discard patch within 12 hours or as directed by MD   Yes [provider]  LORazepam (ATIVAN) 1 MG tablet Take 1 mg by mouth 2 (two) times daily.   Yes [provider]  Melatonin 10 MG TABS Take 10 mg by mouth at bedtime.   Yes [provider]  metFORMIN (GLUCOPHAGE) 500 MG tablet Take 500 mg by mouth 2 (two) times daily with a meal.   Yes [provider]  sertraline (ZOLOFT) 50 MG tablet Take 50 mg by mouth 2 (two) times daily.    Yes [provider]  tamsulosin (FLOMAX) 0.4 MG CAPS capsule Take 0.4 mg by mouth every evening.   Yes [provider]  traZODone (DESYREL) 50 MG tablet TAKE ONE TABLET (50 MG TOTAL) BY MOUTH AT BEDTIME. 09/11/16  Yes [provider]  fluticasone (FLONASE) 50 MCG/ACT nasal spray Place 1 spray into the nose daily as needed for rhinitis.  01/01/16 12/31/16  [provider]  oxyCODONE-acetaminophen (PERCOCET/ROXICET) 5-325 MG tablet Take 1 tablet by mouth every 6 (six) hours as needed for moderate pain. Patient not taking: Reported on 06/11/2017 10/31/16   Lars Mageollins, Emma M, PA-C    Family History Family History  Problem Relation Age of Onset  . Diabetes Mother   . Heart disease Father   . Heart disease  Brother     Social History Social History   Tobacco Use  . Smoking status: Former Smoker    Types: Cigarettes  . Smokeless tobacco: Never Used  . Tobacco comment: Quit smoking cigarettes 10/ 2017  Substance Use Topics  . Alcohol use: No    Comment: former  . Drug use: No     Allergies   Penicillins   Review of Systems Review of Systems 10 Systems reviewed and are negative for acute change except as noted in the HPI.  Physical Exam Updated Vital Signs BP (!) 160/80   Pulse 89   Temp (!) 101.7 F (38.7 C) (Oral)   Resp (!) 28   Wt 79.4 kg (175 lb)   SpO2 96%   BMI 23.09 kg/m   Physical Exam  Constitutional:  Patient is debilitated and weak in appearance.  No respiratory distress.  HENT:  Head: Normocephalic and atraumatic.  Mucous membranes slightly dry.  Eyes: EOM are normal.  Neck: Neck supple.  Cardiovascular:  Borderline tachycardia.  No gross rub murmur gallop.  Pulmonary/Chest: Effort normal and breath sounds normal.  Abdominal: Soft. He exhibits no distension. There is no tenderness.  Musculoskeletal:  Left below the knee amputation.  Right lower extremity dressing on right foot.  Patient's wife reports they keep a chronically wrapped due to frequent wounds.  Neurological:  Patient is awake and alert in appearance but does not interact verbally.  Limited use of extremities.  Skin: Skin is warm and dry.     ED Treatments / Results  Labs (all labs ordered are listed, but only abnormal results are displayed) Labs Reviewed  COMPREHENSIVE METABOLIC PANEL - Abnormal; Notable for the following components:      Result Value   CO2 21 (*)    Glucose, Bld 323 (*)    BUN 35 (*)    Creatinine, Ser 1.69 (*)    Total Protein 8.7 (*)    GFR calc non Af Amer 40 (*)    GFR calc Af Amer 46 (*)    All other components within normal limits  CBC WITH DIFFERENTIAL/PLATELET - Abnormal; Notable for the following components:   WBC 13.6 (*)    Neutro Abs 10.8 (*)      Monocytes Absolute 1.2 (*)    All other components within normal limits  URINALYSIS, ROUTINE W REFLEX MICROSCOPIC - Abnormal; Notable for the following components:   APPearance CLOUDY (*)    Glucose, UA >=500 (*)    Hgb urine dipstick MODERATE (*)    Ketones, ur 20 (*)    Protein, ur 100 (*)    Leukocytes, UA SMALL (*)    Bacteria, UA MANY (*)    Squamous Epithelial / LPF 0-5 (*)  All other components within normal limits  I-STAT CG4 LACTIC ACID, ED - Abnormal; Notable for the following components:   Lactic Acid, Venous 3.51 (*)    All other components within normal limits  I-STAT CHEM 8, ED - Abnormal; Notable for the following components:   BUN 44 (*)    Creatinine, Ser 1.40 (*)    Glucose, Bld 341 (*)    Calcium, Ion 0.97 (*)    All other components within normal limits  I-STAT CG4 LACTIC ACID, ED - Abnormal; Notable for the following components:   Lactic Acid, Venous 2.51 (*)    All other components within normal limits  CULTURE, BLOOD (ROUTINE X 2)  CULTURE, BLOOD (ROUTINE X 2)  PROTIME-INR  INFLUENZA PANEL BY PCR (TYPE A & B)  I-STAT CG4 LACTIC ACID, ED  I-STAT CG4 LACTIC ACID, ED    EKG  EKG Interpretation  Date/Time:  Wednesday June 11 2017 09:58:57 EST Ventricular Rate:  127 PR Interval:    QRS Duration: 92 QT Interval:  355 QTC Calculation: 516 R Axis:   74 Text Interpretation:  Sinus tachycardia Consider right atrial enlargement Repol abnrm suggests ischemia, anterolateral Prolonged QT interval agree. rate related changes and diffuse T wave inversion c/w old Confirmed by Arby BarrettePfeiffer, Abran Gavigan [phone]([id number]) on 06/11/2017 4:26:56 PM       Radiology Dg Chest Portable 1 View  Result Date: 06/11/2017 CLINICAL DATA:  Sepsis EXAM: PORTABLE CHEST 1 VIEW COMPARISON:  None. FINDINGS: Hypoventilation with bibasilar atelectasis. Negative for heart failure or pneumonia. Heart size within normal limits. No significant pleural effusion Advanced degenerative change left  shoulder IMPRESSION: Mild bibasilar atelectasis.  Negative for pneumonia. Electronically Signed   By: Marlan Palauharles  Clark M.D.   On: 06/11/2017 11:07    Procedures Procedures (including critical care time)  Medications Ordered in ED Medications  vancomycin (VANCOCIN) IVPB 750 mg/150 ml premix (not administered)  piperacillin-tazobactam (ZOSYN) IVPB 3.375 g (not administered)  sodium chloride 0.9 % bolus 1,000 mL (0 mLs Intravenous Stopped 06/11/17 1156)    And  sodium chloride 0.9 % bolus 1,000 mL (0 mLs Intravenous Stopped 06/11/17 1300)    And  sodium chloride 0.9 % bolus 500 mL (0 mLs Intravenous Stopped 06/11/17 1245)  acetaminophen (TYLENOL) suppository 650 mg (650 mg Rectal Given 06/11/17 1106)  piperacillin-tazobactam (ZOSYN) IVPB 3.375 g (0 g Intravenous Stopped 06/11/17 1230)  vancomycin (VANCOCIN) 1,500 mg in sodium chloride 0.9 % 500 mL IVPB (0 mg Intravenous Stopped 06/11/17 1408)     Initial Impression / Assessment and Plan / ED Course  I have reviewed the triage vital signs and the nursing notes.  Pertinent labs & imaging results that were available during my care of the patient were reviewed by me and considered in my medical decision making (see chart for details).      Final Clinical Impressions(s) / ED Diagnoses   Final diagnoses:  Sepsis, due to unspecified organism [facility](HCC)  Disorientation  Severe comorbid illness   Patient does have problems with severe disability due to prior stroke and other medical complications.  Patient's wife does care for him in the home.  [facility]  He has been getting somewhat ill over about 3 days but today was significantly changed and found to have high fever.  Patient has had good response to treatment.  Etiology of infection is unclear.  Sepsis protocol initiated with Zosyn and Vancomycin. ED Discharge Orders    None  Arby Barrette, MD 06/11/17 1625    Arby Barrette, MD 06/11/17 [phone]

## 2017-06-12 ENCOUNTER — Inpatient Hospital Stay (HOSPITAL_COMMUNITY): Payer: Medicare (Managed Care)

## 2017-06-12 ENCOUNTER — Encounter (HOSPITAL_COMMUNITY): Payer: Self-pay | Admitting: Diagnostic Radiology

## 2017-06-12 DIAGNOSIS — N111 Chronic obstructive pyelonephritis: Secondary | ICD-10-CM

## 2017-06-12 DIAGNOSIS — A4102 Sepsis due to Methicillin resistant Staphylococcus aureus: Secondary | ICD-10-CM

## 2017-06-12 HISTORY — PX: IR NEPHROSTOMY PLACEMENT RIGHT: IMG6064

## 2017-06-12 HISTORY — PX: IR NEPHROSTOMY PLACEMENT LEFT: IMG6063

## 2017-06-12 LAB — BLOOD CULTURE ID PANEL (REFLEXED)
Acinetobacter baumannii: NOT DETECTED
CANDIDA GLABRATA: NOT DETECTED
CANDIDA PARAPSILOSIS: NOT DETECTED
Candida albicans: NOT DETECTED
Candida krusei: NOT DETECTED
Candida tropicalis: NOT DETECTED
ENTEROBACTER CLOACAE COMPLEX: NOT DETECTED
ENTEROCOCCUS SPECIES: NOT DETECTED
ESCHERICHIA COLI: NOT DETECTED
Enterobacteriaceae species: NOT DETECTED
Haemophilus influenzae: NOT DETECTED
KLEBSIELLA OXYTOCA: NOT DETECTED
Klebsiella pneumoniae: NOT DETECTED
LISTERIA MONOCYTOGENES: NOT DETECTED
Methicillin resistance: DETECTED — AB
Neisseria meningitidis: NOT DETECTED
PROTEUS SPECIES: NOT DETECTED
Pseudomonas aeruginosa: NOT DETECTED
SERRATIA MARCESCENS: NOT DETECTED
STAPHYLOCOCCUS AUREUS BCID: NOT DETECTED
STAPHYLOCOCCUS SPECIES: DETECTED — AB
STREPTOCOCCUS AGALACTIAE: NOT DETECTED
Streptococcus pneumoniae: NOT DETECTED
Streptococcus pyogenes: NOT DETECTED
Streptococcus species: NOT DETECTED

## 2017-06-12 LAB — C DIFFICILE QUICK SCREEN W PCR REFLEX
C DIFFICILE (CDIFF) INTERP: NOT DETECTED
C Diff antigen: NEGATIVE
C Diff toxin: NEGATIVE

## 2017-06-12 LAB — BASIC METABOLIC PANEL
Anion gap: 11 (ref 5–15)
Anion gap: 11 (ref 5–15)
BUN: 43 mg/dL — AB (ref 6–20)
BUN: 51 mg/dL — AB (ref 6–20)
CALCIUM: 8.2 mg/dL — AB (ref 8.9–10.3)
CALCIUM: 7.9 mg/dL — AB (ref 8.9–10.3)
CHLORIDE: 114 mmol/L — AB (ref 101–111)
CO2: 21 mmol/L — ABNORMAL LOW (ref 22–32)
CO2: 19 mmol/L — AB (ref 22–32)
CREATININE: 2.64 mg/dL — AB (ref 0.61–1.24)
Chloride: 112 mmol/L — ABNORMAL HIGH (ref 101–111)
Creatinine, Ser: 1.86 mg/dL — ABNORMAL HIGH (ref 0.61–1.24)
GFR calc Af Amer: 41 mL/min — ABNORMAL LOW (ref >60)
GFR calc Af Amer: 27 mL/min — ABNORMAL LOW (ref >60)
GFR calc non Af Amer: 23 mL/min — ABNORMAL LOW (ref >60)
GFR, EST NON AFRICAN AMERICAN: 35 mL/min — AB (ref >60)
GLUCOSE: 334 mg/dL — AB (ref 65–99)
Glucose, Bld: 282 mg/dL — ABNORMAL HIGH (ref 65–99)
Potassium: 3.2 mmol/L — ABNORMAL LOW (ref 3.5–5.1)
Potassium: 3.5 mmol/L (ref 3.5–5.1)
SODIUM: 144 mmol/L (ref 135–145)
Sodium: 144 mmol/L (ref 135–145)

## 2017-06-12 LAB — RESPIRATORY PANEL BY PCR
Adenovirus: NOT DETECTED
BORDETELLA PERTUSSIS-RVPCR: NOT DETECTED
CORONAVIRUS 229E-RVPPCR: NOT DETECTED
CORONAVIRUS OC43-RVPPCR: NOT DETECTED
Chlamydophila pneumoniae: NOT DETECTED
Coronavirus HKU1: NOT DETECTED
Coronavirus NL63: NOT DETECTED
INFLUENZA B-RVPPCR: NOT DETECTED
Influenza A: NOT DETECTED
METAPNEUMOVIRUS-RVPPCR: NOT DETECTED
Mycoplasma pneumoniae: NOT DETECTED
PARAINFLUENZA VIRUS 1-RVPPCR: NOT DETECTED
PARAINFLUENZA VIRUS 2-RVPPCR: NOT DETECTED
PARAINFLUENZA VIRUS 3-RVPPCR: NOT DETECTED
PARAINFLUENZA VIRUS 4-RVPPCR: NOT DETECTED
RESPIRATORY SYNCYTIAL VIRUS-RVPPCR: NOT DETECTED
RHINOVIRUS / ENTEROVIRUS - RVPPCR: NOT DETECTED

## 2017-06-12 LAB — CBC
HCT: 37.9 % — ABNORMAL LOW (ref 39.0–52.0)
Hemoglobin: 12.2 g/dL — ABNORMAL LOW (ref 13.0–17.0)
MCH: 30 pg (ref 26.0–34.0)
MCHC: 32.2 g/dL (ref 30.0–36.0)
MCV: 93.1 fL (ref 78.0–100.0)
PLATELETS: 216 10*3/uL (ref 150–400)
RBC: 4.07 MIL/uL — ABNORMAL LOW (ref 4.22–5.81)
RDW: 12.8 % (ref 11.5–15.5)
WBC: 14 10*3/uL — AB (ref 4.0–10.5)

## 2017-06-12 LAB — LACTIC ACID, PLASMA
LACTIC ACID, VENOUS: 1.2 mmol/L (ref 0.5–1.9)
Lactic Acid, Venous: 1.2 mmol/L (ref 0.5–1.9)

## 2017-06-12 LAB — MRSA PCR SCREENING: MRSA BY PCR: NEGATIVE

## 2017-06-12 LAB — MAGNESIUM: Magnesium: 1.1 mg/dL — ABNORMAL LOW (ref 1.7–2.4)

## 2017-06-12 LAB — GLUCOSE, CAPILLARY
GLUCOSE-CAPILLARY: 281 mg/dL — AB (ref 65–99)
Glucose-Capillary: 323 mg/dL — ABNORMAL HIGH (ref 65–99)
Glucose-Capillary: 293 mg/dL — ABNORMAL HIGH (ref 65–99)
Glucose-Capillary: 303 mg/dL — ABNORMAL HIGH (ref 65–99)

## 2017-06-12 MED ORDER — FENTANYL CITRATE (PF) 100 MCG/2ML IJ SOLN
INTRAMUSCULAR | Status: AC | PRN
  Administered 2017-06-12: 25 ug via INTRAVENOUS

## 2017-06-12 MED ORDER — ACETAMINOPHEN 650 MG RE SUPP
325.0000 mg | Freq: Once | RECTAL | Status: AC
  Administered 2017-06-12: 325 mg via RECTAL
  Filled 2017-06-12: qty 1

## 2017-06-12 MED ORDER — CIPROFLOXACIN IN D5W 400 MG/200ML IV SOLN
400.0000 mg | Freq: Once | INTRAVENOUS | Status: AC
  Administered 2017-06-12: 400 mg via INTRAVENOUS
  Filled 2017-06-12: qty 200

## 2017-06-12 MED ORDER — MIDAZOLAM HCL 2 MG/2ML IJ SOLN
INTRAMUSCULAR | Status: AC
  Filled 2017-06-12: qty 2

## 2017-06-12 MED ORDER — MIDAZOLAM HCL 2 MG/2ML IJ SOLN
INTRAMUSCULAR | Status: AC | PRN
  Administered 2017-06-12: 1 mg via INTRAVENOUS

## 2017-06-12 MED ORDER — METOPROLOL TARTRATE 5 MG/5ML IV SOLN
5.0000 mg | Freq: Once | INTRAVENOUS | Status: AC
  Administered 2017-06-12: 5 mg via INTRAVENOUS
  Filled 2017-06-12: qty 5

## 2017-06-12 MED ORDER — POTASSIUM CHLORIDE 10 MEQ/100ML IV SOLN
10.0000 meq | Freq: Once | INTRAVENOUS | Status: AC
Start: 2017-06-12 — End: 2017-06-12
  Administered 2017-06-12: 10 meq via INTRAVENOUS
  Filled 2017-06-12: qty 100

## 2017-06-12 MED ORDER — SODIUM CHLORIDE 0.9 % IV SOLN: INTRAVENOUS | Status: DC

## 2017-06-12 MED ORDER — ACETAMINOPHEN 325 MG PO TABS: 325.0000 mg | ORAL_TABLET | Freq: Once | ORAL | Status: DC

## 2017-06-12 MED ORDER — BARIUM SULFATE 2.1 % PO SUSP
ORAL | Status: AC
  Filled 2017-06-12: qty 2

## 2017-06-12 MED ORDER — MAGNESIUM SULFATE 4 GM/100ML IV SOLN
4.0000 g | Freq: Once | INTRAVENOUS | Status: AC
  Administered 2017-06-12: 4 g via INTRAVENOUS
  Filled 2017-06-12 (×3): qty 100

## 2017-06-12 MED ORDER — CIPROFLOXACIN IN D5W 400 MG/200ML IV SOLN
INTRAVENOUS | Status: AC
  Administered 2017-06-12: 400 mg via INTRAVENOUS
  Filled 2017-06-12: qty 200

## 2017-06-12 MED ORDER — ACETAMINOPHEN 650 MG RE SUPP
975.0000 mg | Freq: Four times a day (QID) | RECTAL | Status: DC | PRN
  Administered 2017-06-13 – 2017-06-14 (×5): 975 mg via RECTAL
  Filled 2017-06-12 (×5): qty 2

## 2017-06-12 MED ORDER — FENTANYL CITRATE (PF) 100 MCG/2ML IJ SOLN
INTRAMUSCULAR | Status: AC
  Filled 2017-06-12: qty 2

## 2017-06-12 MED ORDER — ACETAMINOPHEN 500 MG PO TABS
1000.0000 mg | ORAL_TABLET | Freq: Four times a day (QID) | ORAL | Status: DC | PRN
  Administered 2017-06-14 – 2017-06-16 (×2): 1000 mg via ORAL
  Filled 2017-06-12 (×2): qty 2

## 2017-06-12 MED ORDER — POTASSIUM CHLORIDE 10 MEQ/100ML IV SOLN
10.0000 meq | INTRAVENOUS | Status: AC
  Administered 2017-06-12 (×3): 10 meq via INTRAVENOUS
  Filled 2017-06-12 (×3): qty 100

## 2017-06-12 MED ORDER — SODIUM CHLORIDE 0.9 % IV SOLN
INTRAVENOUS | Status: AC
  Administered 2017-06-12 – 2017-06-13 (×3): via INTRAVENOUS

## 2017-06-12 MED ORDER — SODIUM CHLORIDE 0.9 % IV SOLN
INTRAVENOUS | Status: AC | PRN
  Administered 2017-06-12: 10 mL/h via INTRAVENOUS

## 2017-06-12 MED ORDER — IOPAMIDOL (ISOVUE-300) INJECTION 61%
INTRAVENOUS | Status: AC
  Administered 2017-06-12: 15 mL
  Filled 2017-06-12: qty 50

## 2017-06-12 MED ORDER — LIDOCAINE HCL (PF) 1 % IJ SOLN
INTRAMUSCULAR | Status: AC
  Filled 2017-06-12: qty 30

## 2017-06-12 MED ORDER — LIDOCAINE HCL (PF) 1 % IJ SOLN
INTRAMUSCULAR | Status: AC | PRN
  Administered 2017-06-12: 10 mL

## 2017-06-12 NOTE — Sedation Documentation (Signed)
Patient is resting comfortably. 

## 2017-06-12 NOTE — Progress Notes (Signed)
Inpatient Diabetes Program Recommendations  AACE/ADA: New Consensus Statement on Inpatient Glycemic Control (2015)  Target Ranges:  Prepandial:   less than 140 mg/dL      Peak postprandial:   less than 180 mg/dL (1-2 hours)      Critically ill patients:  140 - 180 mg/dL   Lab Results  Component Value Date   GLUCAP 323 (H) 06/12/2017   HGBA1C 9.5 (H) 10/30/2016    Review of Glycemic ControlResults for Margaretha SeedsBENNETT, Alan A JR. (MRN [id number]) as of 06/12/2017 09:57  Ref. Range 06/11/2017 16:55 06/11/2017 23:25 06/12/2017 08:25  Glucose-Capillary Latest Ref Range: 65 - 99 mg/dL 782283 (H) 956247 (H) 213323 (H)    Diabetes history: Type 2 DM Outpatient Diabetes medications:  Metformin 500 mg bid, Lantus 40 units q HS Current orders for Inpatient glycemic control:  Novolog sensitive tid with meals, Lantus 20 units q HS  Inpatient Diabetes Program Recommendations:    Consider increasing Lantus to home dose of 40 units q HS. Also consider increasing Novolog correction to moderate tid with meals.   Thanks, Beryl MeagerJenny Eryn Marandola, RN, BC-ADM Inpatient Diabetes Coordinator Pager [phone] (8a-5p)

## 2017-06-12 NOTE — Progress Notes (Signed)
  PHARMACY - PHYSICIAN COMMUNICATION CRITICAL VALUE ALERT - BLOOD CULTURE IDENTIFICATION (BCID)  Alan SeedsGlenn A Cisnero Jr. is an 71 y.o. male who [facility] with a chief complaint of sepsis.  Assessment: On abx for sepsis of unknown source. Still with fevers and elevated WBC. Blood cx 2/4 positive with staph species. Drawn from different sites but one is from only the aerobic bottle and other only from anaerobic bottle. Could still be contaminated cx's but with presentation would treat as real. CXR negative for PNA. No known source yet.  Name of physician (or Provider) Contacted: Dr. Crista ElliotHarbrecht  Current antibiotics: Vancomycin and cefepime  Changes to prescribed antibiotics recommended:  Continue vancomycin and consider stopping cefepime Recommendations accepted by provider  Results for orders placed or performed during the hospital encounter of 06/11/17  Blood Culture ID Panel (Reflexed) (Collected: 06/11/2017 11:19 AM)  Result Value Ref Range   Enterococcus species NOT DETECTED NOT DETECTED   Listeria monocytogenes NOT DETECTED NOT DETECTED   Staphylococcus species DETECTED (A) NOT DETECTED   Staphylococcus aureus NOT DETECTED NOT DETECTED   Methicillin resistance DETECTED (A) NOT DETECTED   Streptococcus species NOT DETECTED NOT DETECTED   Streptococcus agalactiae NOT DETECTED NOT DETECTED   Streptococcus pneumoniae NOT DETECTED NOT DETECTED   Streptococcus pyogenes NOT DETECTED NOT DETECTED   Acinetobacter baumannii NOT DETECTED NOT DETECTED   Enterobacteriaceae species NOT DETECTED NOT DETECTED   Enterobacter cloacae complex NOT DETECTED NOT DETECTED   Escherichia coli NOT DETECTED NOT DETECTED   Klebsiella oxytoca NOT DETECTED NOT DETECTED   Klebsiella pneumoniae NOT DETECTED NOT DETECTED   Proteus species NOT DETECTED NOT DETECTED   Serratia marcescens NOT DETECTED NOT DETECTED   Haemophilus influenzae NOT DETECTED NOT DETECTED   Neisseria meningitidis  NOT DETECTED NOT DETECTED   Pseudomonas aeruginosa NOT DETECTED NOT DETECTED   Candida albicans NOT DETECTED NOT DETECTED   Candida glabrata NOT DETECTED NOT DETECTED   Candida krusei NOT DETECTED NOT DETECTED   Candida parapsilosis NOT DETECTED NOT DETECTED   Candida tropicalis NOT DETECTED NOT DETECTED    Juley Giovanetti J 06/12/2017  11:22 AM

## 2017-06-12 NOTE — Progress Notes (Signed)
Pt received from IR. Report previously received from [facility] RN. Pt and wife oriented to room and equipment. Pt has noted temperature - cannot give tylenol until 6:20PM. Telemetry applied, CCMD notified.   Leonidas Rombergaitlin [name], RN

## 2017-06-12 NOTE — Progress Notes (Signed)
Notified MD of blood glucose 303

## 2017-06-12 NOTE — Progress Notes (Signed)
   Subjective: The patient is unable to speak clearly at baseline but is much worse on this admission as compared to baseline as per wife. She stated that he is typically able to converse semi-normally prior to the past several days. He was able to respond to his wife when asked if he loved her and he was able to move the fingers of his right hand on command.   Objective:  Vital signs in last 24 hours: Vitals:   06/11/17 2300 06/11/17 2350 06/12/17 0300 06/12/17 1034  BP: (!) 164/81  (!) 166/71 (!) 168/78  Pulse: 93  92 (!) 109  Resp:   (!) 22 (!) 22  Temp: (!) 101.3 F (38.5 C) 98.8 F (37.1 C) (!) 101.3 F (38.5 C) (!) 103.1 F (39.5 C)  TempSrc: Oral Oral Axillary Axillary  SpO2: 95%  97% 96%  Weight:      Height:       ROS negative except as per HPI.  Physical Exam  Constitutional: He appears well-developed and well-nourished. No distress.  Cardiovascular: Normal rate.  No murmur heard. Pulmonary/Chest: Effort normal. No respiratory distress.  Abdominal: Soft. Bowel sounds are normal. He exhibits no distension.  Musculoskeletal: He exhibits no edema or tenderness.  Psychiatric: His mood appears not anxious. He is not agitated.  Patient is minimally responsive to verbal commands but appears presents as he attempts to respond to commands and responds to his wife asked if she loves him. He opens his eyes at will and attempts to respond verbally but is unable to clearly do so.   Vitals reviewed.  Assessment/Plan:  Active Problems:   Sepsis (HCC)  71 year old male who presented with a three day history of   Sepsis: Patient appears to be worsening clinically. WBC increased to 14.0.  --Vanc trough pending as per pharmacy --Vancomycin being administered as per pharmacy protocol at this time. --Methicillin resistant staph species bacteriemia as per Bx --Vancomycin loading dose given, at 750mg  q12 hours as per pharmacy consult,  --Discontinued Cefapime --Requested vancomycin  trough, may increase dosing if patient worsens either way --Echocardiogram, TTE  --CT Head negative for acute intracranial process --Will discuss with ID for assistance  --CT chest and abdomin is concerning for obstructive hydronephrosis of the right kidney. Given his renal injury, and obstruction, as well as septic picture, we are in need of infection source control. --Stat consult has been placed to Urology for assistance  --Discussed with urology, for possible IR placed nephrostomy tube for drainage vs surgical procedure --Urology will discuss IR tube placement as he may not stable to surgery at this time. --Repeat CBC in am  Prolonged QTc:  --Hypokalemia: 3.2, will replace with four runs of IV potassium --Mag level pending, BMP for repeat potassium following IV administration of Potassium(will not be complete prior to lab, this is intended) --EKG concerning as QTC is prolonged. Will discontinue fluconazole   Mild DKA, DMII: Glucose 334 this am. GFR worsened this morning to 35 with Cr to 1.86 this am. Potassium was 3.2 being replaced with IV potassium.  --NPO given failed swallow evaluation --CBG q4 with S-SSI, meal coverage and lantus 20U QHS  AKI: Cr increased again this am to 1.86. Will increase fluids to 14525ml/hr --Recheck BMP for potassium and Cr.  Diet: NPO, failed swallow eval Code: DNR Fluids: 125ml normal saline Dvt PPX: heparin 5000U Dispo: Anticipated discharge in approximately 2-3 day(s).   Lanelle BalHarbrecht, Talan Gildner, MD 06/12/2017, 11:41 AM Pager: Pager# [phone]

## 2017-06-12 NOTE — Progress Notes (Addendum)
0800 Bladder scan 100ml.   1035 Notified MD of elevated temperature, BP, pulse and low potassium level.   1515 Patient transferred to IR.  1525 [facility] on [facility].

## 2017-06-12 NOTE — Progress Notes (Addendum)
Notified MD of temp 102.9 axillary, patient in SVT 170's, orders received.

## 2017-06-12 NOTE — Consult Note (Signed)
Chief Complaint: Patient was seen in consultation today for bilateral percutaneous nephrostomy drain placements Chief Complaint  Patient presents with  . Altered Mental Status  . Code Sepsis   at the request of Dr Raylene Miyamoto  Supervising Physician: Richarda Overlie  Patient Status: [facility] - In-pt  History of Present Illness: Alan [name]. is a 71 y.o. male   Hx dementia New onset worsening Altered mental status  Sepsis: MRSA bacteremia Obstructive pyelonephritis B hydronephrosis CT today: 1. Moderate to severe bilateral Obstructive Uropathy. - 5 x 8 mm proximal Right ureter calculus about 2 cm distal to the right renal pelvis. - 5 x 7 mm distal Left ureter calculus just proximal to the UVJ. - a separate 6 mm calculus within the right urinary bladder probably is a recently passed stone.  Request now for B Percutaneous nephrostomy drains Dr Lowella Dandy has reviewed imaging and approves procedure Has had Hep 5000 Inj x 2 today (5 am and 122 pm)    Past Medical History:  Diagnosis Date  . Anemia   . Arthritis   . Dementia   . Diabetes mellitus without complication (HCC)   . ETOH abuse   . Myocardial infarction (HCC)   . Peripheral arterial disease (HCC)   . Stroke (HCC)   . Wears dentures   . Wears glasses     Past Surgical History:  Procedure Laterality Date  . ABDOMINAL AORTOGRAM N/A 10/25/2016   Procedure: Abdominal Aortogram;  Surgeon: Sherren Kerns, MD;  Location: [facility] INVASIVE CV LAB;  Service: Cardiovascular;  Laterality: N/A;  . BRAIN SURGERY    . CORONARY STENT PLACEMENT     " about 25 years ago (10/29/16)  . ENDARTERECTOMY FEMORAL Right 10/30/2016   Procedure: Eduardo Osier WITH PATCH GRAFT;  Surgeon: Sherren Kerns, MD;  Location: [facility] OR;  Service: Vascular;  Laterality: Right;  . LOWER EXTREMITY ANGIOGRAPHY Right 10/25/2016   Procedure: Lower Extremity Angiography;  Surgeon: Sherren Kerns, MD;  Location: [facility] INVASIVE CV LAB;  Service:  Cardiovascular;  Laterality: Right;    Allergies: Penicillins  Medications: Prior to Admission medications   Medication Sig Start Date End Date Taking? Authorizing Provider  acetaminophen (TYLENOL) 650 MG CR tablet Take 650 mg by mouth 3 (three) times daily.    Yes [provider]  aspirin EC 81 MG tablet Take 81 mg by mouth daily.   Yes [provider]  atorvastatin (LIPITOR) 10 MG tablet Take 10 mg by mouth every evening.   Yes [provider]  insulin glargine (LANTUS) 100 UNIT/ML injection Inject 40 Units into the skin at bedtime.  06/11/16  Yes [provider]  lidocaine (LIDODERM) 5 % Place 1 patch onto the skin daily. Remove & Discard patch within 12 hours or as directed by MD   Yes [provider]  LORazepam (ATIVAN) 1 MG tablet Take 1 mg by mouth 2 (two) times daily.   Yes [provider]  Melatonin 10 MG TABS Take 10 mg by mouth at bedtime.   Yes [provider]  metFORMIN (GLUCOPHAGE) 500 MG tablet Take 500 mg by mouth 2 (two) times daily with a meal.   Yes [provider]  sertraline (ZOLOFT) 50 MG tablet Take 50 mg by mouth 2 (two) times daily.    Yes [provider]  tamsulosin (FLOMAX) 0.4 MG CAPS capsule Take 0.4 mg by mouth every evening.   Yes [provider]  traZODone (DESYREL) 50 MG tablet TAKE ONE  TABLET (50 MG TOTAL) BY MOUTH AT BEDTIME. 09/11/16  Yes [provider]  fluticasone (FLONASE) 50 MCG/ACT nasal spray Place 1 spray into the nose daily as needed for rhinitis.  01/01/16 12/31/16  [provider]  oxyCODONE-acetaminophen (PERCOCET/ROXICET) 5-325 MG tablet Take 1 tablet by mouth every 6 (six) hours as needed for moderate pain. Patient not taking: Reported on 06/11/2017 10/31/16   Lars Mageollins, Emma M, PA-C     Family History  Problem Relation Age of Onset  . Diabetes Mother   . Heart disease Father   . Heart disease Brother     Social History    Socioeconomic History  . Marital status: Married    Spouse name: None  . Number of children: None  . Years of education: None  . Highest education level: None  Social Needs  . Financial resource strain: None  . Food insecurity - worry: None  . Food insecurity - inability: None  . Transportation needs - medical: None  . Transportation needs - non-medical: None  Occupational History  . None  Tobacco Use  . Smoking status: Former Smoker    Types: Cigarettes  . Smokeless tobacco: Never Used  . Tobacco comment: Quit smoking cigarettes 10/ 2017  Substance and Sexual Activity  . Alcohol use: No    Comment: former  . Drug use: No  . Sexual activity: None  Other Topics Concern  . None  Social History Narrative  . None    Review of Systems: A 12 point ROS discussed and pertinent positives are indicated in the HPI above.  All other systems are negative.  Review of Systems  Constitutional: Positive for activity change and fever.  Respiratory: Negative for shortness of breath.   Cardiovascular: Negative for chest pain.  Gastrointestinal: Positive for nausea. Negative for abdominal pain.  Genitourinary: Positive for decreased urine volume.  Neurological: Positive for weakness.  Psychiatric/Behavioral: Positive for confusion.    Vital Signs: BP (!) 168/78 (BP Location: Right Arm)   Pulse (!) 109   Temp (!) 102.6 F (39.2 C) (Axillary) Comment: rn notified  Resp (!) 22   Ht 6' (1.829 m)   Wt 161 lb 2.5 oz (73.1 kg)   SpO2 96%   BMI 21.86 kg/m   Physical Exam  Cardiovascular: Normal rate and regular rhythm.  Pulmonary/Chest: Effort normal and breath sounds normal.  Abdominal: Soft.  Musculoskeletal:  No response   Neurological: He is alert.  Pt does follow me in room No response verbally or physically  Skin: Skin is warm and dry.  Psychiatric:  Wife at bedside Consents for procedure (understands risk of bleeding secondary Hep injections)  Nursing note and  vitals reviewed.   Imaging: Ct Abdomen Pelvis Wo Contrast  Result Date: 06/12/2017 CLINICAL DATA:  71 year old male admitted yesterday with sepsis and altered mental status. EXAM: CT CHEST, ABDOMEN AND PELVIS WITHOUT CONTRAST TECHNIQUE: Multidetector CT imaging of the chest, abdomen and pelvis was performed following the standard protocol without IV contrast. COMPARISON:  Portable chest 06/11/2017. FINDINGS: CT CHEST FINDINGS Cardiovascular: Vascular patency is not evaluated in the absence of IV contrast. Calcified coronary artery and descending thoracic aortic atherosclerosis. Cardiac size at the upper limits of normal. No pericardial effusion. Mediastinum/Nodes: Negative noncontrast thoracic inlet and mediastinum. No lymphadenopathy. Small volume retained secretions in the thoracic esophagus which is nondilated. Lungs/Pleura: Major airways are patent. Respiratory motion artifact mostly in lower lobes. Upper lobe paraseptal emphysema. Posterior lower lobe and costophrenic angle subpleural opacity which could reflect  a combination of atelectasis and chronic pulmonary interstitial changes such as mild honeycombing. No pleural effusion. No consolidation. Musculoskeletal: Osteopenia. Mild chronic appearing T3 superior endplate compression. Chronic left posterior ninth rib fracture. No acute osseous abnormality identified. CT ABDOMEN PELVIS FINDINGS Hepatobiliary: Negative noncontrast liver and gallbladder. Pancreas: Negative. Spleen: Negative. Adrenals/Urinary Tract: Normal adrenal glands. Moderate to severe right and moderate left hydronephrosis. Associated bilateral extensive pararenal soft tissue stranding and mild expansion of the bilateral pararenal spaces. Thickening of the lateral Conal Foss show. Multiple punctate intrarenal calculi on the left. Questionable right midpole nephrolithiasis versus vascular calcification (coronal image 95). Associated bilateral hydroureter and periureteral stranding. The  right ureter is abnormal to the level of a 5 x 8 mm calculus lodged just distal to the right ureteropelvic junction corresponding to the L3-L4 spinal level (series 3, image 87). Distal to this calculus there is persistent right periureteral stranding, but the right ureter is more decompressed. The right UVJ appears normal, however within the right posterior bladder there is an oval 3 x 6 mm calculus (series 3, image 121 and coronal image 98. The left ureter remains abnormal to the ureterovesical junction where an oval 5 x 7 mm calculus is demonstrated on coronal image 101. The urinary bladder is fairly decompressed. No perivesical stranding. Stomach/Bowel: Bulky stool ball in the rectum (sagittal image 102). Redundant sigmoid colon is mildly gas distended and tracks into the epigastrium. The descending colon is decompressed with some retained stool. Trace fluid in the left gutter appears probably related to the left renal space. Negative splenic flexure and transverse colon. Mildly redundant hepatic flexure. Decompressed ascending colon. Trace fluid in the right gutter appears probably related to the right renal space. The right colon is redundant and the cecum is located on a lax mesentery in the right upper abdomen. Negative terminal ileum. Appendix is diminutive or absent. No dilated small bowel. Jejunum and ileum appear within normal limits. The duodenum appears mildly to moderately thickened and inflamed (coronal image 77), but this is felt to be secondary to the right renal findings. Negative stomach. No abdominal free air. Trace abdominal free fluid, primarily in both pericolic gutters. Vascular/Lymphatic: Aortoiliac calcified atherosclerosis. Vascular patency is not evaluated in the absence of IV contrast. Infrarenal abdominal aorta measures up to 25 mm diameter. No lymphadenopathy. Reproductive: Negative. Other: No pelvic free fluid. Musculoskeletal: Previously augmented L1 compression fracture. Osteopenia.  Advanced L5-S1 disc degeneration. No acute osseous abnormality identified. IMPRESSION: 1. Moderate to severe bilateral Obstructive Uropathy. - 5 x 8 mm proximal Right ureter calculus about 2 cm distal to the right renal pelvis. - 5 x 7 mm distal Left ureter calculus just proximal to the UVJ. - a separate 6 mm calculus within the right urinary bladder probably is a recently passed stone. 2. Moderate to severe bilateral pararenal inflammatory changes. Consider Forniceal Rupture. 3. Secondary inflammatory involvement of the Duodenum, and small volume of free fluid in both pericolic gutters also thought to be secondary to the renal findings. 4. Mild pulmonary atelectasis possibly superimposed on chronic lower lobe interstitial disease. Mild Emphysema (ICD10-J43.9). 5. Bulky stool ball in the rectum.  Consider fecal impaction. 6. Aortic Atherosclerosis (ICD10-I70.0). Ectatic abdominal aorta at risk for aneurysm development. Recommend followup by ultrasound in 5 years. This recommendation follows ACR consensus guidelines: White Paper of the ACR Incidental Findings Committee II on Vascular Findings. J Am Coll Radiol 2013; 10:789-794. Electronically Signed   By: Odessa Fleming M.D.   On: 06/12/2017 11:40   Ct  Head Wo Contrast  Result Date: 06/12/2017 CLINICAL DATA:  Altered mental status, sepsis EXAM: CT HEAD WITHOUT CONTRAST TECHNIQUE: Contiguous axial images were obtained from the base of the skull through the vertex without intravenous contrast. COMPARISON:  None. FINDINGS: Brain: No evidence of acute infarction, hemorrhage, extra-axial collection, ventriculomegaly, or mass effect. Right basal ganglia lacunar infarct. Generalized cerebral atrophy. Periventricular white matter low attenuation likely secondary to microangiopathy. Vascular: Cerebrovascular atherosclerotic calcifications are noted. Skull: Negative for fracture or focal lesion. Sinuses/Orbits: Visualized portions of the orbits are unremarkable. Visualized  portions of the paranasal sinuses and mastoid air cells are unremarkable. Other: None. IMPRESSION: 1. No acute intracranial pathology. 2. Chronic microvascular disease and cerebral atrophy. Electronically Signed   By: Elige Ko   On: 06/12/2017 11:22   Ct Chest Wo Contrast  Result Date: 06/12/2017 CLINICAL DATA:  71 year old male admitted yesterday with sepsis and altered mental status. EXAM: CT CHEST, ABDOMEN AND PELVIS WITHOUT CONTRAST TECHNIQUE: Multidetector CT imaging of the chest, abdomen and pelvis was performed following the standard protocol without IV contrast. COMPARISON:  Portable chest 06/11/2017. FINDINGS: CT CHEST FINDINGS Cardiovascular: Vascular patency is not evaluated in the absence of IV contrast. Calcified coronary artery and descending thoracic aortic atherosclerosis. Cardiac size at the upper limits of normal. No pericardial effusion. Mediastinum/Nodes: Negative noncontrast thoracic inlet and mediastinum. No lymphadenopathy. Small volume retained secretions in the thoracic esophagus which is nondilated. Lungs/Pleura: Major airways are patent. Respiratory motion artifact mostly in lower lobes. Upper lobe paraseptal emphysema. Posterior lower lobe and costophrenic angle subpleural opacity which could reflect a combination of atelectasis and chronic pulmonary interstitial changes such as mild honeycombing. No pleural effusion. No consolidation. Musculoskeletal: Osteopenia. Mild chronic appearing T3 superior endplate compression. Chronic left posterior ninth rib fracture. No acute osseous abnormality identified. CT ABDOMEN PELVIS FINDINGS Hepatobiliary: Negative noncontrast liver and gallbladder. Pancreas: Negative. Spleen: Negative. Adrenals/Urinary Tract: Normal adrenal glands. Moderate to severe right and moderate left hydronephrosis. Associated bilateral extensive pararenal soft tissue stranding and mild expansion of the bilateral pararenal spaces. Thickening of the lateral Conal Foss  show. Multiple punctate intrarenal calculi on the left. Questionable right midpole nephrolithiasis versus vascular calcification (coronal image 95). Associated bilateral hydroureter and periureteral stranding. The right ureter is abnormal to the level of a 5 x 8 mm calculus lodged just distal to the right ureteropelvic junction corresponding to the L3-L4 spinal level (series 3, image 87). Distal to this calculus there is persistent right periureteral stranding, but the right ureter is more decompressed. The right UVJ appears normal, however within the right posterior bladder there is an oval 3 x 6 mm calculus (series 3, image 121 and coronal image 98. The left ureter remains abnormal to the ureterovesical junction where an oval 5 x 7 mm calculus is demonstrated on coronal image 101. The urinary bladder is fairly decompressed. No perivesical stranding. Stomach/Bowel: Bulky stool ball in the rectum (sagittal image 102). Redundant sigmoid colon is mildly gas distended and tracks into the epigastrium. The descending colon is decompressed with some retained stool. Trace fluid in the left gutter appears probably related to the left renal space. Negative splenic flexure and transverse colon. Mildly redundant hepatic flexure. Decompressed ascending colon. Trace fluid in the right gutter appears probably related to the right renal space. The right colon is redundant and the cecum is located on a lax mesentery in the right upper abdomen. Negative terminal ileum. Appendix is diminutive or absent. No dilated small bowel. Jejunum and ileum appear within normal  limits. The duodenum appears mildly to moderately thickened and inflamed (coronal image 77), but this is felt to be secondary to the right renal findings. Negative stomach. No abdominal free air. Trace abdominal free fluid, primarily in both pericolic gutters. Vascular/Lymphatic: Aortoiliac calcified atherosclerosis. Vascular patency is not evaluated in the absence of IV  contrast. Infrarenal abdominal aorta measures up to 25 mm diameter. No lymphadenopathy. Reproductive: Negative. Other: No pelvic free fluid. Musculoskeletal: Previously augmented L1 compression fracture. Osteopenia. Advanced L5-S1 disc degeneration. No acute osseous abnormality identified. IMPRESSION: 1. Moderate to severe bilateral Obstructive Uropathy. - 5 x 8 mm proximal Right ureter calculus about 2 cm distal to the right renal pelvis. - 5 x 7 mm distal Left ureter calculus just proximal to the UVJ. - a separate 6 mm calculus within the right urinary bladder probably is a recently passed stone. 2. Moderate to severe bilateral pararenal inflammatory changes. Consider Forniceal Rupture. 3. Secondary inflammatory involvement of the Duodenum, and small volume of free fluid in both pericolic gutters also thought to be secondary to the renal findings. 4. Mild pulmonary atelectasis possibly superimposed on chronic lower lobe interstitial disease. Mild Emphysema (ICD10-J43.9). 5. Bulky stool ball in the rectum.  Consider fecal impaction. 6. Aortic Atherosclerosis (ICD10-I70.0). Ectatic abdominal aorta at risk for aneurysm development. Recommend followup by ultrasound in 5 years. This recommendation follows ACR consensus guidelines: White Paper of the ACR Incidental Findings Committee II on Vascular Findings. J Am Coll Radiol 2013; 10:789-794. Electronically Signed   By: Odessa FlemingH  Hall M.D.   On: 06/12/2017 11:40   Dg Chest Portable 1 View  Result Date: 06/11/2017 CLINICAL DATA:  Sepsis EXAM: PORTABLE CHEST 1 VIEW COMPARISON:  None. FINDINGS: Hypoventilation with bibasilar atelectasis. Negative for heart failure or pneumonia. Heart size within normal limits. No significant pleural effusion Advanced degenerative change left shoulder IMPRESSION: Mild bibasilar atelectasis.  Negative for pneumonia. Electronically Signed   By: Marlan Palauharles  Clark M.D.   On: 06/11/2017 11:07    Labs:  CBC: Recent Labs    10/30/16 0851  10/31/16 0259 06/11/17 1036 06/11/17 1042 06/12/17 0539  WBC 8.2 7.4  --  13.6* 14.0*  HGB 11.8* 10.3* 14.3 13.8 12.2*  HCT 36.5* 32.2* 42.0 41.6 37.9*  PLT 184 164  --  292 216    COAGS: Recent Labs    10/30/16 0851 06/11/17 1042  INR 1.03 1.08  APTT 28  --     BMP: Recent Labs    10/31/16 0259 06/11/17 1036 06/11/17 1042 06/11/17 1905 06/12/17 0539  NA 136 142 138 144 144  K 4.1 4.4 3.7 3.6 3.2*  CL 101 107 102 110 112*  CO2 27  --  21* 22 21*  GLUCOSE 181* 341* 323* 288* 334*  BUN 11 44* 35* 34* 43*  CALCIUM 8.5*  --  9.7 8.2* 8.2*  CREATININE 0.64 1.40* 1.69* 1.32* 1.86*  GFRNONAA >60  --  40* 53* 35*  GFRAA >60  --  46* >60 41*    LIVER FUNCTION TESTS: Recent Labs    10/30/16 0851 06/11/17 1042  BILITOT 0.3 1.0  AST 19 27  ALT 19 20  ALKPHOS 76 69  PROT 6.9 8.7*  ALBUMIN 3.5 3.6    TUMOR MARKERS: No results for input(s): AFPTM, CEA, CA199, CHROMGRNA in the last 8760 hours.  Assessment and Plan:  Bilateral Hydronephrosis Sepsis MRSA bacteremia Fever; AMS Scheduled for B PCNs in IR Risks and benefits of Bilateral percutaneous nephrostomy drains were discussed with the patient's wife  including, but not limited to, infection, bleeding, significant bleeding causing loss or decrease in renal function or damage to adjacent structures. Increased bleeding risk secondary Hep injections. All of her questions were answered, patient is agreeable to proceed.  Consent signed and in chart.  Thank you for this interesting consult.  I greatly enjoyed meeting [name] and look forward to participating in their care.  A copy of this report was sent to the requesting provider on this date.  Electronically Signed: Robet Leu, PA-C 06/12/2017, 2:36 PM   I spent a total of 40 Minutes    in face to face in clinical consultation, greater than 50% of which was counseling/coordinating care for B PCNs

## 2017-06-12 NOTE — Procedures (Signed)
  Pre-operative Diagnosis:  Sepsis and bilateral hydronephrosis from obstructive ureteral stones     Post-operative Diagnosis: Same  Indications: Sepsis  Procedure: Bilateral nephrostomy tube placement  Findings: Moderate left hydro.  Moderate-Severe right hydro.  Placement of bilateral nephrostomy tubes.  Blood tinged cloudy fluid removed from left kidney.   Bloody and mildly purulent fluid from right kidney.  Complications: No immediate      EBL: Minimal  Plan: Follow drainage.  Send fluid samples for culture.

## 2017-06-12 NOTE — Progress Notes (Signed)
Pt. B/P is high 164/81 ,MD is notified.

## 2017-06-12 NOTE — Consult Note (Signed)
Urology Consult  CC: Referring physician:  Reason for referral:   Impression/Assessment:   1.  Bilateral, obstructing ureteral calculi: He has bilateral ureteral calculi with a 4-1/2 mm stone in the mid right ureter with proximal hydronephrosis and significant perinephric stranding.  There may also be a small stone at the ureterovesical junction on the right-hand side as well.  On the left-hand side there is a stone about the same size located nearly in the intramural ureter with associated left hydronephrosis.  With infected appearing urine, positive blood cultures and fever to 104 with an elevated white count he requires urgent decompression/source control.  After discussing his clinical condition with Dr. Oswaldo DoneVincent it is felt he would not be a good candidate for general anesthetic and bilateral stent placement and therefore my recommendation is that he undergo bilateral percutaneous nephrostomy tube placement.  I have spoken to interventional radiology regarding this and this has been scheduled.  I have discussed the procedure with his wife as well as the anticipated treatment of his stones once his infection has been cleared.   Plan: 1.  Source control with bilateral nephrostomy tube drainage. 2.  Currently appears to have adequate antibiotic and antifungal therapy.     History of Present Illness: Mr. Willeen CassBennett is a 71 year old male who is seen in hospital consultation today for further evaluation of bilateral ureteral obstruction and sepsis.  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  His wife reports that at his baseline he requires a wheelchair but is verbal and is interactive however approximately a week ago he began to have some malaise and decreased appetite.  He then developed some diarrhea.  His wife reports he has a history of BPH with outlet obstruction and has  seen a urologist in the past but has not been seen for some time.  He underwent a CT scan which has revealed bilateral ureteral calculi with a stone that is about 4 mm in the mid right ureter and a second stone at the ureterovesical junction on the left-hand side.  There is significant perinephric stranding and hydronephrosis noted bilaterally.  His urine appeared infected upon admission and 2 blood cultures were found to be positive for Staphylococcus.  A urine culture does not appear to have been performed.  His wife reports that he has no prior history of kidney stones and does not know of any family history of calculus disease.  He has had no previous difficulty with urinary tract infections or prostatitis to her knowledge.  His current condition would be considered severe with no modifying factors or associated signs and symptoms other than as above.  Past Medical History:  Diagnosis Date  . Anemia   . Arthritis   . Dementia   . Diabetes mellitus without complication (HCC)   . ETOH abuse   . Myocardial infarction (HCC)   . Peripheral arterial disease (HCC)   . Stroke (HCC)   . Wears dentures   . Wears glasses    Past Surgical History:  Procedure Laterality Date  . ABDOMINAL AORTOGRAM N/A 10/25/2016   Procedure: Abdominal Aortogram;  Surgeon: Sherren KernsFields, Charles E, MD;  Location: [facility] INVASIVE CV LAB;  Service: Cardiovascular;  Laterality: N/A;  . BRAIN SURGERY    . CORONARY STENT PLACEMENT     " about 25 years ago (10/29/16)  . ENDARTERECTOMY FEMORAL Right 10/30/2016   Procedure:  ENDARTERECTOMY FEMORAL-RIGHT WITH PATCH GRAFT;  Surgeon: Sherren Kerns, MD;  Location: [facility] OR;  Service: Vascular;  Laterality: Right;  . LOWER EXTREMITY ANGIOGRAPHY Right 10/25/2016   Procedure: Lower Extremity Angiography;  Surgeon: Sherren Kerns, MD;  Location: [facility] INVASIVE CV LAB;  Service: Cardiovascular;  Laterality: Right;    Medications:  Scheduled: . Barium Sulfate      . heparin  5,000 Units  Subcutaneous Q8H  . insulin aspart  0-9 Units Subcutaneous TID WC  . insulin glargine  20 Units Subcutaneous QHS   Continuous: . sodium chloride 125 mL/hr at 06/12/17 1048  . [START ON 06/13/2017] sodium chloride    . magnesium sulfate 1 - 4 g bolus IVPB    . potassium chloride 10 mEq (06/12/17 1455)  . vancomycin Stopped (06/12/17 1315)    Allergies:  Allergies  Allergen Reactions  . Penicillins Rash    Has patient had a PCN reaction causing immediate rash, facial/tongue/throat swelling, SOB or lightheadedness with hypotension: Yes Has patient had a PCN reaction causing severe rash involving mucus membranes or skin necrosis: No Has patient had a PCN reaction that required hospitalization No Has patient had a PCN reaction occurring within the last 10 years: No If all of the above answers are "NO", then may proceed with Cephalosporin use.     Family History  Problem Relation Age of Onset  . Diabetes Mother   . Heart disease Father   . Heart disease Brother     Social History:  reports that he has quit smoking. His smoking use included cigarettes. he has never used smokeless tobacco. He reports that he does not drink alcohol or use drugs.  Review of Systems (10 point): Pertinent items are noted in HPI. A comprehensive review of systems was negative except as noted above.  Physical Exam:  Vital signs in last 24 hours: Temp:  [98.8 F (37.1 C)-103.1 F (39.5 C)] 102 F (38.9 C) (12/20 1502) Pulse Rate:  [92-109] 98 (12/20 1502) Resp:  [14-33] 17 (12/20 1502) BP: (150-168)/(71-83) 152/83 (12/20 1502) SpO2:  [95 %-97 %] 96 % (12/20 1502) Weight:  [73.1 kg (161 lb 2.5 oz)] 73.1 kg (161 lb 2.5 oz) (12/19 1845) General appearance: Ill appearing in no apparent distress. Head: Normocephalic, without obvious abnormality, atraumatic Eyes: conjunctivae/corneas clear. EOM's intact.  Oropharynx: moist mucous membranes Neck: supple, symmetrical, trachea midline Resp: normal  respiratory effort Cardio: Tachycardic rate regular rhythm Back: symmetric, no curvature. ROM normal. No CVA tenderness. GI: soft, non-tender; bowel sounds normal; no masses,  no organomegaly Male genitalia: penis: normal male phallus with condom catheter draining clear urine.  Scrotum is normal with no sign of infection with bilaterally descended testicles. Extremities: Left BKA with right trans-metatarsal amputation. Skin: Skin color normal. No visible rashes or lesions Neurologic: He is able to follow simple verbal commands  Laboratory Data:  Recent Labs    06/11/17 1036 06/11/17 1042 06/12/17 0539  WBC  --  13.6* 14.0*  HGB 14.3 13.8 12.2*  HCT 42.0 41.6 37.9*   BMET Recent Labs    06/11/17 1905 06/12/17 0539  NA 144 144  K 3.6 3.2*  CL 110 112*  CO2 22 21*  GLUCOSE 288* 334*  BUN 34* 43*  CREATININE 1.32* 1.86*  CALCIUM 8.2* 8.2*   Recent Labs    06/11/17 1042  INR 1.08   No results for input(s): LABURIN in the last 72 hours. Results for orders placed or performed during the hospital encounter of 06/11/17  Culture, blood (Routine x 2)     Status: None (Preliminary result)   Collection Time: 06/11/17 11:19 AM  Result Value Ref Range Status   Specimen Description BLOOD LEFT ARM  Final   Special Requests   Final    BOTTLES DRAWN AEROBIC ONLY Blood Culture adequate volume   Culture  Setup Time   Final    GRAM POSITIVE COCCI IN CLUSTERS AEROBIC BOTTLE ONLY Organism ID to follow CRITICAL RESULT CALLED TO, READ BACK BY AND VERIFIED WITH: N. Batchelder Pharm.D. 10:00 06/12/17 ([email or web address])    Culture GRAM POSITIVE COCCI  Final   Report Status PENDING  Incomplete  Blood Culture ID Panel (Reflexed)     Status: Abnormal   Collection Time: 06/11/17 11:19 AM  Result Value Ref Range Status   Enterococcus species NOT DETECTED NOT DETECTED Final   Listeria monocytogenes NOT DETECTED NOT DETECTED Final   Staphylococcus species DETECTED (A) NOT DETECTED Final    Comment:  Methicillin (oxacillin) resistant coagulase negative staphylococcus. Possible blood culture contaminant (unless isolated from more than one blood culture draw or clinical case suggests pathogenicity). No antibiotic treatment is indicated for blood  culture contaminants. CRITICAL RESULT CALLED TO, READ BACK BY AND VERIFIED WITH: N. Batchelder Pharm.D. 10:00 06/12/17 ([email or web address])    Staphylococcus aureus NOT DETECTED NOT DETECTED Final   Methicillin resistance DETECTED (A) NOT DETECTED Final    Comment: CRITICAL RESULT CALLED TO, READ BACK BY AND VERIFIED WITH: N. Batchelder Pharm.D. 10:00 06/12/17 ([email or web address])    Streptococcus species NOT DETECTED NOT DETECTED Final   Streptococcus agalactiae NOT DETECTED NOT DETECTED Final   Streptococcus pneumoniae NOT DETECTED NOT DETECTED Final   Streptococcus pyogenes NOT DETECTED NOT DETECTED Final   Acinetobacter baumannii NOT DETECTED NOT DETECTED Final   Enterobacteriaceae species NOT DETECTED NOT DETECTED Final   Enterobacter cloacae complex NOT DETECTED NOT DETECTED Final   Escherichia coli NOT DETECTED NOT DETECTED Final   Klebsiella oxytoca NOT DETECTED NOT DETECTED Final   Klebsiella pneumoniae NOT DETECTED NOT DETECTED Final   Proteus species NOT DETECTED NOT DETECTED Final   Serratia marcescens NOT DETECTED NOT DETECTED Final   Haemophilus influenzae NOT DETECTED NOT DETECTED Final   Neisseria meningitidis NOT DETECTED NOT DETECTED Final   Pseudomonas aeruginosa NOT DETECTED NOT DETECTED Final   Candida albicans NOT DETECTED NOT DETECTED Final   Candida glabrata NOT DETECTED NOT DETECTED Final   Candida krusei NOT DETECTED NOT DETECTED Final   Candida parapsilosis NOT DETECTED NOT DETECTED Final   Candida tropicalis NOT DETECTED NOT DETECTED Final  Culture, blood (Routine x 2)     Status: None (Preliminary result)   Collection Time: 06/11/17 11:40 AM  Result Value Ref Range Status   Specimen Description BLOOD WRIST RIGHT  Final   Special  Requests   Final    BOTTLES DRAWN AEROBIC AND ANAEROBIC Blood Culture adequate volume   Culture  Setup Time   Final    GRAM POSITIVE COCCI IN CLUSTERS ANAEROBIC BOTTLE ONLY CRITICAL VALUE NOTED.  VALUE IS CONSISTENT WITH PREVIOUSLY REPORTED AND CALLED VALUE.    Culture GRAM POSITIVE COCCI  Final   Report Status PENDING  Incomplete  Respiratory Panel by PCR     Status: None   Collection Time: 06/11/17  5:39 PM  Result Value Ref Range Status   Adenovirus NOT DETECTED NOT DETECTED Final   Coronavirus 229E NOT DETECTED NOT DETECTED Final   Coronavirus HKU1 NOT DETECTED NOT DETECTED Final   Coronavirus NL63 NOT DETECTED NOT  DETECTED Final   Coronavirus OC43 NOT DETECTED NOT DETECTED Final   Metapneumovirus NOT DETECTED NOT DETECTED Final   Rhinovirus / Enterovirus NOT DETECTED NOT DETECTED Final   Influenza A NOT DETECTED NOT DETECTED Final   Influenza B NOT DETECTED NOT DETECTED Final   Parainfluenza Virus 1 NOT DETECTED NOT DETECTED Final   Parainfluenza Virus 2 NOT DETECTED NOT DETECTED Final   Parainfluenza Virus 3 NOT DETECTED NOT DETECTED Final   Parainfluenza Virus 4 NOT DETECTED NOT DETECTED Final   Respiratory Syncytial Virus NOT DETECTED NOT DETECTED Final   Bordetella pertussis NOT DETECTED NOT DETECTED Final   Chlamydophila pneumoniae NOT DETECTED NOT DETECTED Final   Mycoplasma pneumoniae NOT DETECTED NOT DETECTED Final  MRSA PCR Screening     Status: None   Collection Time: 06/12/17 12:21 AM  Result Value Ref Range Status   MRSA by PCR NEGATIVE NEGATIVE Final    Comment:        The GeneXpert MRSA Assay (FDA approved for NASAL specimens only), is one component of a comprehensive MRSA colonization surveillance program. It is not intended to diagnose MRSA infection nor to guide or monitor treatment for MRSA infections.   C difficile quick scan w PCR reflex     Status: None   Collection Time: 06/12/17  1:00 AM  Result Value Ref Range Status   C Diff antigen  NEGATIVE NEGATIVE Final   C Diff toxin NEGATIVE NEGATIVE Final   C Diff interpretation No C. difficile detected.  Final   Creatinine: Recent Labs    06/11/17 1036 06/11/17 1042 06/11/17 1905 06/12/17 0539  CREATININE 1.40* 1.69* 1.32* 1.86*    Imaging: Ct Abdomen Pelvis Wo Contrast  Result Date: 06/12/2017 CLINICAL DATA:  71 year old male admitted yesterday with sepsis and altered mental status. EXAM: CT CHEST, ABDOMEN AND PELVIS WITHOUT CONTRAST TECHNIQUE: Multidetector CT imaging of the chest, abdomen and pelvis was performed following the standard protocol without IV contrast. COMPARISON:  Portable chest 06/11/2017. FINDINGS: CT CHEST FINDINGS Cardiovascular: Vascular patency is not evaluated in the absence of IV contrast. Calcified coronary artery and descending thoracic aortic atherosclerosis. Cardiac size at the upper limits of normal. No pericardial effusion. Mediastinum/Nodes: Negative noncontrast thoracic inlet and mediastinum. No lymphadenopathy. Small volume retained secretions in the thoracic esophagus which is nondilated. Lungs/Pleura: Major airways are patent. Respiratory motion artifact mostly in lower lobes. Upper lobe paraseptal emphysema. Posterior lower lobe and costophrenic angle subpleural opacity which could reflect a combination of atelectasis and chronic pulmonary interstitial changes such as mild honeycombing. No pleural effusion. No consolidation. Musculoskeletal: Osteopenia. Mild chronic appearing T3 superior endplate compression. Chronic left posterior ninth rib fracture. No acute osseous abnormality identified. CT ABDOMEN PELVIS FINDINGS Hepatobiliary: Negative noncontrast liver and gallbladder. Pancreas: Negative. Spleen: Negative. Adrenals/Urinary Tract: Normal adrenal glands. Moderate to severe right and moderate left hydronephrosis. Associated bilateral extensive pararenal soft tissue stranding and mild expansion of the bilateral pararenal spaces. Thickening of the  lateral Conal Foss show. Multiple punctate intrarenal calculi on the left. Questionable right midpole nephrolithiasis versus vascular calcification (coronal image 95). Associated bilateral hydroureter and periureteral stranding. The right ureter is abnormal to the level of a 5 x 8 mm calculus lodged just distal to the right ureteropelvic junction corresponding to the L3-L4 spinal level (series 3, image 87). Distal to this calculus there is persistent right periureteral stranding, but the right ureter is more decompressed. The right UVJ appears normal, however within the right posterior bladder there is an oval 3 x  6 mm calculus (series 3, image 121 and coronal image 98. The left ureter remains abnormal to the ureterovesical junction where an oval 5 x 7 mm calculus is demonstrated on coronal image 101. The urinary bladder is fairly decompressed. No perivesical stranding. Stomach/Bowel: Bulky stool ball in the rectum (sagittal image 102). Redundant sigmoid colon is mildly gas distended and tracks into the epigastrium. The descending colon is decompressed with some retained stool. Trace fluid in the left gutter appears probably related to the left renal space. Negative splenic flexure and transverse colon. Mildly redundant hepatic flexure. Decompressed ascending colon. Trace fluid in the right gutter appears probably related to the right renal space. The right colon is redundant and the cecum is located on a lax mesentery in the right upper abdomen. Negative terminal ileum. Appendix is diminutive or absent. No dilated small bowel. Jejunum and ileum appear within normal limits. The duodenum appears mildly to moderately thickened and inflamed (coronal image 77), but this is felt to be secondary to the right renal findings. Negative stomach. No abdominal free air. Trace abdominal free fluid, primarily in both pericolic gutters. Vascular/Lymphatic: Aortoiliac calcified atherosclerosis. Vascular patency is not evaluated in  the absence of IV contrast. Infrarenal abdominal aorta measures up to 25 mm diameter. No lymphadenopathy. Reproductive: Negative. Other: No pelvic free fluid. Musculoskeletal: Previously augmented L1 compression fracture. Osteopenia. Advanced L5-S1 disc degeneration. No acute osseous abnormality identified. IMPRESSION: 1. Moderate to severe bilateral Obstructive Uropathy. - 5 x 8 mm proximal Right ureter calculus about 2 cm distal to the right renal pelvis. - 5 x 7 mm distal Left ureter calculus just proximal to the UVJ. - a separate 6 mm calculus within the right urinary bladder probably is a recently passed stone. 2. Moderate to severe bilateral pararenal inflammatory changes. Consider Forniceal Rupture. 3. Secondary inflammatory involvement of the Duodenum, and small volume of free fluid in both pericolic gutters also thought to be secondary to the renal findings. 4. Mild pulmonary atelectasis possibly superimposed on chronic lower lobe interstitial disease. Mild Emphysema (ICD10-J43.9). 5. Bulky stool ball in the rectum.  Consider fecal impaction. 6. Aortic Atherosclerosis (ICD10-I70.0). Ectatic abdominal aorta at risk for aneurysm development. Recommend followup by ultrasound in 5 years. This recommendation follows ACR consensus guidelines: White Paper of the ACR Incidental Findings Committee II on Vascular Findings. J Am Coll Radiol 2013; 10:789-794. Electronically Signed   By: Odessa Fleming M.D.   On: 06/12/2017 11:40   Ct Head Wo Contrast  Result Date: 06/12/2017 CLINICAL DATA:  Altered mental status, sepsis EXAM: CT HEAD WITHOUT CONTRAST TECHNIQUE: Contiguous axial images were obtained from the base of the skull through the vertex without intravenous contrast. COMPARISON:  None. FINDINGS: Brain: No evidence of acute infarction, hemorrhage, extra-axial collection, ventriculomegaly, or mass effect. Right basal ganglia lacunar infarct. Generalized cerebral atrophy. Periventricular white matter low attenuation  likely secondary to microangiopathy. Vascular: Cerebrovascular atherosclerotic calcifications are noted. Skull: Negative for fracture or focal lesion. Sinuses/Orbits: Visualized portions of the orbits are unremarkable. Visualized portions of the paranasal sinuses and mastoid air cells are unremarkable. Other: None. IMPRESSION: 1. No acute intracranial pathology. 2. Chronic microvascular disease and cerebral atrophy. Electronically Signed   By: Elige Ko   On: 06/12/2017 11:22   Ct Chest Wo Contrast  Result Date: 06/12/2017 CLINICAL DATA:  71 year old male admitted yesterday with sepsis and altered mental status. EXAM: CT CHEST, ABDOMEN AND PELVIS WITHOUT CONTRAST TECHNIQUE: Multidetector CT imaging of the chest, abdomen and pelvis was performed following the  standard protocol without IV contrast. COMPARISON:  Portable chest 06/11/2017. FINDINGS: CT CHEST FINDINGS Cardiovascular: Vascular patency is not evaluated in the absence of IV contrast. Calcified coronary artery and descending thoracic aortic atherosclerosis. Cardiac size at the upper limits of normal. No pericardial effusion. Mediastinum/Nodes: Negative noncontrast thoracic inlet and mediastinum. No lymphadenopathy. Small volume retained secretions in the thoracic esophagus which is nondilated. Lungs/Pleura: Major airways are patent. Respiratory motion artifact mostly in lower lobes. Upper lobe paraseptal emphysema. Posterior lower lobe and costophrenic angle subpleural opacity which could reflect a combination of atelectasis and chronic pulmonary interstitial changes such as mild honeycombing. No pleural effusion. No consolidation. Musculoskeletal: Osteopenia. Mild chronic appearing T3 superior endplate compression. Chronic left posterior ninth rib fracture. No acute osseous abnormality identified. CT ABDOMEN PELVIS FINDINGS Hepatobiliary: Negative noncontrast liver and gallbladder. Pancreas: Negative. Spleen: Negative. Adrenals/Urinary Tract: Normal  adrenal glands. Moderate to severe right and moderate left hydronephrosis. Associated bilateral extensive pararenal soft tissue stranding and mild expansion of the bilateral pararenal spaces. Thickening of the lateral Conal Foss show. Multiple punctate intrarenal calculi on the left. Questionable right midpole nephrolithiasis versus vascular calcification (coronal image 95). Associated bilateral hydroureter and periureteral stranding. The right ureter is abnormal to the level of a 5 x 8 mm calculus lodged just distal to the right ureteropelvic junction corresponding to the L3-L4 spinal level (series 3, image 87). Distal to this calculus there is persistent right periureteral stranding, but the right ureter is more decompressed. The right UVJ appears normal, however within the right posterior bladder there is an oval 3 x 6 mm calculus (series 3, image 121 and coronal image 98. The left ureter remains abnormal to the ureterovesical junction where an oval 5 x 7 mm calculus is demonstrated on coronal image 101. The urinary bladder is fairly decompressed. No perivesical stranding. Stomach/Bowel: Bulky stool ball in the rectum (sagittal image 102). Redundant sigmoid colon is mildly gas distended and tracks into the epigastrium. The descending colon is decompressed with some retained stool. Trace fluid in the left gutter appears probably related to the left renal space. Negative splenic flexure and transverse colon. Mildly redundant hepatic flexure. Decompressed ascending colon. Trace fluid in the right gutter appears probably related to the right renal space. The right colon is redundant and the cecum is located on a lax mesentery in the right upper abdomen. Negative terminal ileum. Appendix is diminutive or absent. No dilated small bowel. Jejunum and ileum appear within normal limits. The duodenum appears mildly to moderately thickened and inflamed (coronal image 77), but this is felt to be secondary to the right renal  findings. Negative stomach. No abdominal free air. Trace abdominal free fluid, primarily in both pericolic gutters. Vascular/Lymphatic: Aortoiliac calcified atherosclerosis. Vascular patency is not evaluated in the absence of IV contrast. Infrarenal abdominal aorta measures up to 25 mm diameter. No lymphadenopathy. Reproductive: Negative. Other: No pelvic free fluid. Musculoskeletal: Previously augmented L1 compression fracture. Osteopenia. Advanced L5-S1 disc degeneration. No acute osseous abnormality identified. IMPRESSION: 1. Moderate to severe bilateral Obstructive Uropathy. - 5 x 8 mm proximal Right ureter calculus about 2 cm distal to the right renal pelvis. - 5 x 7 mm distal Left ureter calculus just proximal to the UVJ. - a separate 6 mm calculus within the right urinary bladder probably is a recently passed stone. 2. Moderate to severe bilateral pararenal inflammatory changes. Consider Forniceal Rupture. 3. Secondary inflammatory involvement of the Duodenum, and small volume of free fluid in both pericolic gutters also thought to  be secondary to the renal findings. 4. Mild pulmonary atelectasis possibly superimposed on chronic lower lobe interstitial disease. Mild Emphysema (ICD10-J43.9). 5. Bulky stool ball in the rectum.  Consider fecal impaction. 6. Aortic Atherosclerosis (ICD10-I70.0). Ectatic abdominal aorta at risk for aneurysm development. Recommend followup by ultrasound in 5 years. This recommendation follows ACR consensus guidelines: White Paper of the ACR Incidental Findings Committee II on Vascular Findings. J Am Coll Radiol 2013; 10:789-794. Electronically Signed   By: Odessa Fleming M.D.   On: 06/12/2017 11:40   Dg Chest Portable 1 View  Result Date: 06/11/2017 CLINICAL DATA:  Sepsis EXAM: PORTABLE CHEST 1 VIEW COMPARISON:  None. FINDINGS: Hypoventilation with bibasilar atelectasis. Negative for heart failure or pneumonia. Heart size within normal limits. No significant pleural effusion Advanced  degenerative change left shoulder IMPRESSION: Mild bibasilar atelectasis.  Negative for pneumonia. Electronically Signed   By: Marlan Palau M.D.   On: 06/11/2017 11:07    CT scan images were independently reviewed.   Briceson Broadwater C 06/12/2017, 3:15 PM

## 2017-06-12 NOTE — Progress Notes (Signed)
PT Cancellation Note  Patient Details Name: Alan SeedsGlenn A Hlavac Jr. MRN: [id number] DOB: June 30, 1945   Cancelled Treatment:    Reason Eval/Treat Not Completed: Medical issues which prohibited therapy;Patient at procedure or test/unavailable.  Nursing asked PT to hold and now is in procedure so will try later as time and pt allow.   Ivar DrapeRuth E France Lusty 06/12/2017, 1:10 PM   Samul Dadauth Arayla Kruschke, PT MS Acute Rehab Dept. Number: [facility] [phone] and [facility] [phone]

## 2017-06-12 NOTE — Progress Notes (Addendum)
Internal Medicine Attending:   I saw and examined the patient. I reviewed the resident's note and I agree with the resident's findings and plan as documented in the resident's note.   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 The staph could be Staph saprophyticus which is a urinary pathogen.  He is still febrile, still encephalopathic, but hemodynamically stable for now. I communicated to his wife that this is a high risk infection with significant chance for worsening given his comorbidities. We have consulted with urology and I anticipate they will opt for percutaneous nephrostomy tubes which should give us source control of this infection. Continue with vancomycin for staph, repeat blood cultures tomorrow, and TTE/TEE for endocarditis eval once more stable.

## 2017-06-12 NOTE — Evaluation (Signed)
Clinical/Bedside Swallow Evaluation Patient Details  Name: Alan SeedsGlenn A Macon Jr. MRN: [id number] Date of Birth: March 31, 1946  Today's Date: 06/12/2017 Time: SLP Start Time (ACUTE ONLY): [id number] SLP Stop Time (ACUTE ONLY): 0857 SLP Time Calculation (min) (ACUTE ONLY): 18 min  Past Medical History:  Past Medical History:  Diagnosis Date  . Anemia   . Arthritis   . Dementia   . Diabetes mellitus without complication (HCC)   . ETOH abuse   . Myocardial infarction (HCC)   . Peripheral arterial disease (HCC)   . Stroke (HCC)   . Wears dentures   . Wears glasses    Past Surgical History:  Past Surgical History:  Procedure Laterality Date  . ABDOMINAL AORTOGRAM N/A 10/25/2016   Procedure: Abdominal Aortogram;  Surgeon: Sherren KernsFields, Charles E, MD;  [facility];  Service: Cardiovascular;  Laterality: N/A;  . BRAIN SURGERY    . CORONARY STENT PLACEMENT     " about 25 years ago (10/29/16)  . ENDARTERECTOMY FEMORAL Right 10/30/2016   Procedure: Eduardo OsierENDARTERECTOMY FEMORAL-RIGHT WITH PATCH GRAFT;  Surgeon: Sherren KernsFields, Charles E, MD;  [facility];  Service: Vascular;  Laterality: Right;  . LOWER EXTREMITY ANGIOGRAPHY Right 10/25/2016   Procedure: Lower Extremity Angiography;  Surgeon: Sherren KernsFields, Charles E, MD;  [facility];  Service: Cardiovascular;  Laterality: Right;   HPI:  71 year old male admitted with decreased responsiveness, confusion, worsening baseline cough, high fever, difficulty swallowing, decreased po intake. Being treated for candiduria and UTI. CXR negative.  PMH of ETOH abuse, dementia, CVA.    Assessment / Plan / Recommendation Clinical Impression  Patient with evidence of a moderate-severe oropharyngeal dysphagia with cognitive component. Patient with focused attention to clinician via eye contact to voice in less than 25% of attempts. Able to orally accept bolus and initiate manipulation of ice chip however with only intermittent initiation of pharyngeal swallow.  Post swallow, patient with consistent wet throat clearing and cough suggestive of decreased airway protection. Per wife, patient with baseline dysphagia characterized by coughing with pos (solids > liquids). Suspect baseline dysphagia exacerbated by acute illness. Patient not ready for pos at this time. SLP will f/u.  SLP Visit Diagnosis: Dysphagia, oropharyngeal phase (R13.12)    Aspiration Risk  Severe aspiration risk    Diet Recommendation NPO   Medication Administration: Via alternative means    Other  Recommendations Oral Care Recommendations: Oral care QID   Follow up Recommendations (TBD)      Frequency and Duration min 3x week  2 weeks       Prognosis Prognosis for Safe Diet Advancement: Good Barriers to Reach Goals: Cognitive deficits      Swallow Study   General HPI: 71 year old male admitted with decreased responsiveness, confusion, worsening baseline cough, high fever, difficulty swallowing, decreased po intake. Being treated for candiduria and UTI. CXR negative.  PMH of ETOH abuse, dementia, CVA.  Type of Study: Bedside Swallow Evaluation Previous Swallow Assessment: none Diet Prior to this Study: NPO Temperature Spikes Noted: Yes Respiratory Status: Room air History of Recent Intubation: No Behavior/Cognition: Requires cueing;Doesn't follow directions Oral Cavity Assessment: Within Functional Limits Oral Cavity - Dentition: Edentulous Vision: Functional for self-feeding Self-Feeding Abilities: Total assist Patient Positioning: Upright in bed Baseline Vocal Quality: Not observed Volitional Cough: Cognitively unable to elicit Volitional Swallow: Unable to elicit    Oral/Motor/Sensory Function Overall Oral Motor/Sensory Function: (unable to formally assess however appears [facility])   Ice Chips Ice chips: Impaired Presentation: [employer]  Oral Phase Impairments: Reduced lingual movement/coordination;Poor awareness of bolus Oral Phase Functional Implications: Oral  holding;Prolonged oral transit Pharyngeal Phase Impairments: Suspected delayed Swallow;Multiple swallows;Wet Vocal Quality;Throat Clearing - Immediate;Cough - Immediate   Thin Liquid Thin Liquid: Not tested    Nectar Thick Nectar Thick Liquid: Not tested   Honey Thick Honey Thick Liquid: Not tested   Puree Puree: Not tested   Solid   Alan [name], Alan [name] [phone]    Solid: Not tested        Alan [name] 06/12/2017,9:00 AM

## 2017-06-13 ENCOUNTER — Inpatient Hospital Stay (HOSPITAL_COMMUNITY): Payer: Medicare (Managed Care)

## 2017-06-13 DIAGNOSIS — R7881 Bacteremia: Secondary | ICD-10-CM

## 2017-06-13 DIAGNOSIS — Z936 Other artificial openings of urinary tract status: Secondary | ICD-10-CM

## 2017-06-13 DIAGNOSIS — Z794 Long term (current) use of insulin: Secondary | ICD-10-CM

## 2017-06-13 DIAGNOSIS — R1310 Dysphagia, unspecified: Secondary | ICD-10-CM

## 2017-06-13 DIAGNOSIS — Z89519 Acquired absence of unspecified leg below knee: Secondary | ICD-10-CM

## 2017-06-13 DIAGNOSIS — E1110 Type 2 diabetes mellitus with ketoacidosis without coma: Secondary | ICD-10-CM

## 2017-06-13 DIAGNOSIS — A419 Sepsis, unspecified organism: Secondary | ICD-10-CM

## 2017-06-13 DIAGNOSIS — I4581 Long QT syndrome: Secondary | ICD-10-CM

## 2017-06-13 DIAGNOSIS — N179 Acute kidney failure, unspecified: Secondary | ICD-10-CM

## 2017-06-13 DIAGNOSIS — Z66 Do not resuscitate: Secondary | ICD-10-CM

## 2017-06-13 DIAGNOSIS — N2889 Other specified disorders of kidney and ureter: Secondary | ICD-10-CM

## 2017-06-13 LAB — CBC WITH DIFFERENTIAL/PLATELET
BASOS ABS: 0 10*3/uL (ref 0.0–0.1)
BASOS PCT: 0 %
EOS ABS: 0 10*3/uL (ref 0.0–0.7)
EOS PCT: 0 %
HCT: 39.5 % (ref 39.0–52.0)
Hemoglobin: 12.4 g/dL — ABNORMAL LOW (ref 13.0–17.0)
Lymphocytes Relative: 16 %
Lymphs Abs: 2 10*3/uL (ref 0.7–4.0)
MCH: 29.5 pg (ref 26.0–34.0)
MCHC: 31.4 g/dL (ref 30.0–36.0)
MCV: 94 fL (ref 78.0–100.0)
MONO ABS: 1.3 10*3/uL — AB (ref 0.1–1.0)
Monocytes Relative: 10 %
Neutro Abs: 9.5 10*3/uL — ABNORMAL HIGH (ref 1.7–7.7)
Neutrophils Relative %: 74 %
PLATELETS: 211 10*3/uL (ref 150–400)
RBC: 4.2 MIL/uL — ABNORMAL LOW (ref 4.22–5.81)
RDW: 13.4 % (ref 11.5–15.5)
WBC: 12.8 10*3/uL — ABNORMAL HIGH (ref 4.0–10.5)

## 2017-06-13 LAB — BASIC METABOLIC PANEL
Anion gap: 11 (ref 5–15)
BUN: 44 mg/dL — AB (ref 6–20)
CO2: 21 mmol/L — ABNORMAL LOW (ref 22–32)
CREATININE: 2.29 mg/dL — AB (ref 0.61–1.24)
Calcium: 8.3 mg/dL — ABNORMAL LOW (ref 8.9–10.3)
Chloride: 114 mmol/L — ABNORMAL HIGH (ref 101–111)
GFR, EST AFRICAN AMERICAN: 32 mL/min — AB (ref >60)
GFR, EST NON AFRICAN AMERICAN: 27 mL/min — AB (ref >60)
Glucose, Bld: 308 mg/dL — ABNORMAL HIGH (ref 65–99)
Potassium: 3.6 mmol/L (ref 3.5–5.1)
SODIUM: 146 mmol/L — AB (ref 135–145)

## 2017-06-13 LAB — GLUCOSE, CAPILLARY
GLUCOSE-CAPILLARY: 314 mg/dL — AB (ref 65–99)
GLUCOSE-CAPILLARY: 320 mg/dL — AB (ref 65–99)
GLUCOSE-CAPILLARY: 255 mg/dL — AB (ref 65–99)
Glucose-Capillary: 288 mg/dL — ABNORMAL HIGH (ref 65–99)

## 2017-06-13 LAB — VANCOMYCIN, TROUGH: VANCOMYCIN TR: 22 ug/mL — AB (ref 15–20)

## 2017-06-13 LAB — ECHOCARDIOGRAM LIMITED
HEIGHTINCHES: 72 in
Weight: 2578.5 oz

## 2017-06-13 LAB — MAGNESIUM: MAGNESIUM: 3.1 mg/dL — AB (ref 1.7–2.4)

## 2017-06-13 MED ORDER — VANCOMYCIN HCL 10 G IV SOLR
1250.0000 mg | INTRAVENOUS | Status: DC
  Administered 2017-06-13: 1250 mg via INTRAVENOUS
  Filled 2017-06-13 (×2): qty 1250

## 2017-06-13 MED ORDER — POTASSIUM CHLORIDE 10 MEQ/100ML IV SOLN
10.0000 meq | INTRAVENOUS | Status: AC
  Administered 2017-06-13 (×3): 10 meq via INTRAVENOUS
  Filled 2017-06-13 (×3): qty 100

## 2017-06-13 MED ORDER — DICLOFENAC SODIUM 1 % TD GEL
2.0000 g | Freq: Two times a day (BID) | TRANSDERMAL | Status: DC | PRN
  Administered 2017-06-14: 21:00:00 2 g via TOPICAL
  Filled 2017-06-13: qty 100

## 2017-06-13 MED ORDER — POTASSIUM CHLORIDE 10 MEQ/100ML IV SOLN: 10.0000 meq | INTRAVENOUS | Status: DC

## 2017-06-13 MED ORDER — INSULIN GLARGINE 100 UNIT/ML ~~LOC~~ SOLN
30.0000 [IU] | Freq: Every day | SUBCUTANEOUS | Status: DC
  Administered 2017-06-13 – 2017-06-15 (×3): 30 [IU] via SUBCUTANEOUS
  Filled 2017-06-13 (×3): qty 0.3

## 2017-06-13 MED ORDER — LORAZEPAM 2 MG/ML IJ SOLN
0.5000 mg | Freq: Once | INTRAMUSCULAR | Status: AC
Start: 2017-06-13 — End: 2017-06-13
  Administered 2017-06-13: 0.5 mg via INTRAVENOUS
  Filled 2017-06-13: qty 1

## 2017-06-13 MED ORDER — LIDOCAINE 5 % EX PTCH
1.0000 | MEDICATED_PATCH | CUTANEOUS | Status: AC
  Administered 2017-06-13 – 2017-06-15 (×3): 1 via TRANSDERMAL
  Filled 2017-06-13 (×4): qty 1

## 2017-06-13 MED ORDER — LIDOCAINE 5 % EX PTCH
1.0000 | MEDICATED_PATCH | CUTANEOUS | Status: AC
  Administered 2017-06-13: 1 via TRANSDERMAL
  Filled 2017-06-13: qty 1

## 2017-06-13 MED ORDER — RINGERS IV SOLN
INTRAVENOUS | Status: AC
  Administered 2017-06-13: 08:00:00 via INTRAVENOUS

## 2017-06-13 NOTE — Progress Notes (Signed)
Patient ID: Alan SeedsGlenn A Spader Jr., male   DOB: 01/19/1946, 71 y.o.   MRN: [id number]    Assessment: Bilateral ureteral obstruction secondary to bilateral ureteral calculi with sepsis: He underwent bilateral percutaneous nephrostomy tube placement yesterday for source control.  His fever has decreased and he has become more alert and responsive this morning.  His white count is down slightly.  He is having good output from his left nephrostomy tube but the right side appears to be bloody with some clots.  I have discussed with the patient and his wife the need to leave the nephrostomy tubes for drainage initially with the ability to internalize these to double-J stents in the future.  We also discussed the need for a full course of antibiotics and then follow-up imaging in my office with further plans at that time regarding definitive management of his ureteral calculi.  Plan: 1.  His right nephrostomy tube will be gently irrigated with 5-10 cc of sterile saline. 2.  Continued bilateral nephrostomy drainage for now. 3.  IV antibiotics with conversion to oral antibiotics, for a full 10-day course, based on sensitivities.    Subjective: Patient more alert and responsive this morning.  Reports no significant pain.  Objective: Vital signs in last 24 hours: Temp:  [97.9 F (36.6 C)-103.2 F (39.6 C)] 97.9 F (36.6 C) (12/21 0643) Pulse Rate:  [98-163] 163 (12/20 2046) Resp:  [17-34] 25 (12/21 0552) BP: (146-177)/(78-102) 164/93 (12/21 0444) SpO2:  [94 %-100 %] 100 % (12/21 0643)A  Intake/Output from previous day: 12/20 0701 - 12/21 0700 In: 2860.4 [I.V.:2360.4; IV Piggyback:500] Out: 3125 [Urine:600; Drains:2525] Intake/Output this shift: No intake/output data recorded.  Past Medical History:  Diagnosis Date  . Anemia   . Arthritis   . Dementia   . Diabetes mellitus without complication (HCC)   . ETOH abuse   . Myocardial infarction (HCC)   . Peripheral arterial disease (HCC)   .  Stroke (HCC)   . Wears dentures   . Wears glasses     Physical Exam:  Lungs - Normal respiratory effort, chest expands symmetrically.  Abdomen - Soft, non-tender & non-distended.  Lab Results: Recent Labs    06/11/17 1042 06/12/17 0539 06/13/17 0038  WBC 13.6* 14.0* 12.8*  HGB 13.8 12.2* 12.4*  HCT 41.6 37.9* 39.5   BMET Recent Labs    06/12/17 1450 06/13/17 0038  NA 144 146*  K 3.5 3.6  CL 114* 114*  CO2 19* 21*  GLUCOSE 282* 308*  BUN 51* 44*  CREATININE 2.64* 2.29*  CALCIUM 7.9* 8.3*   No results for input(s): LABURIN in the last 72 hours. Results for orders placed or performed during the hospital encounter of 06/11/17  Culture, blood (Routine x 2)     Status: None (Preliminary result)   Collection Time: 06/11/17 11:19 AM  Result Value Ref Range Status   Specimen Description BLOOD LEFT ARM  Final   Special Requests   Final    BOTTLES DRAWN AEROBIC ONLY Blood Culture adequate volume   Culture  Setup Time   Final    GRAM POSITIVE COCCI IN CLUSTERS AEROBIC BOTTLE ONLY Organism ID to follow CRITICAL RESULT CALLED TO, READ BACK BY AND VERIFIED WITH: N. Batchelder Pharm.D. 10:00 06/12/17 ([email or web address])    Culture GRAM POSITIVE COCCI  Final   Report Status PENDING  Incomplete  Blood Culture ID Panel (Reflexed)     Status: Abnormal   Collection Time: 06/11/17 11:19 AM  Result Value Ref Range Status  Enterococcus species NOT DETECTED NOT DETECTED Final   Listeria monocytogenes NOT DETECTED NOT DETECTED Final   Staphylococcus species DETECTED (A) NOT DETECTED Final    Comment: Methicillin (oxacillin) resistant coagulase negative staphylococcus. Possible blood culture contaminant (unless isolated from more than one blood culture draw or clinical case suggests pathogenicity). No antibiotic treatment is indicated for blood  culture contaminants. CRITICAL RESULT CALLED TO, READ BACK BY AND VERIFIED WITH: N. Batchelder Pharm.D. 10:00 06/12/17 ([email or web address])    Staphylococcus  aureus NOT DETECTED NOT DETECTED Final   Methicillin resistance DETECTED (A) NOT DETECTED Final    Comment: CRITICAL RESULT CALLED TO, READ BACK BY AND VERIFIED WITH: N. Batchelder Pharm.D. 10:00 06/12/17 ([email or web address])    Streptococcus species NOT DETECTED NOT DETECTED Final   Streptococcus agalactiae NOT DETECTED NOT DETECTED Final   Streptococcus pneumoniae NOT DETECTED NOT DETECTED Final   Streptococcus pyogenes NOT DETECTED NOT DETECTED Final   Acinetobacter baumannii NOT DETECTED NOT DETECTED Final   Enterobacteriaceae species NOT DETECTED NOT DETECTED Final   Enterobacter cloacae complex NOT DETECTED NOT DETECTED Final   Escherichia coli NOT DETECTED NOT DETECTED Final   Klebsiella oxytoca NOT DETECTED NOT DETECTED Final   Klebsiella pneumoniae NOT DETECTED NOT DETECTED Final   Proteus species NOT DETECTED NOT DETECTED Final   Serratia marcescens NOT DETECTED NOT DETECTED Final   Haemophilus influenzae NOT DETECTED NOT DETECTED Final   Neisseria meningitidis NOT DETECTED NOT DETECTED Final   Pseudomonas aeruginosa NOT DETECTED NOT DETECTED Final   Candida albicans NOT DETECTED NOT DETECTED Final   Candida glabrata NOT DETECTED NOT DETECTED Final   Candida krusei NOT DETECTED NOT DETECTED Final   Candida parapsilosis NOT DETECTED NOT DETECTED Final   Candida tropicalis NOT DETECTED NOT DETECTED Final  Culture, blood (Routine x 2)     Status: None (Preliminary result)   Collection Time: 06/11/17 11:40 AM  Result Value Ref Range Status   Specimen Description BLOOD WRIST RIGHT  Final   Special Requests   Final    BOTTLES DRAWN AEROBIC AND ANAEROBIC Blood Culture adequate volume   Culture  Setup Time   Final    GRAM POSITIVE COCCI IN CLUSTERS ANAEROBIC BOTTLE ONLY CRITICAL VALUE NOTED.  VALUE IS CONSISTENT WITH PREVIOUSLY REPORTED AND CALLED VALUE.    Culture GRAM POSITIVE COCCI  Final   Report Status PENDING  Incomplete  Respiratory Panel by PCR     Status: None   Collection  Time: 06/11/17  5:39 PM  Result Value Ref Range Status   Adenovirus NOT DETECTED NOT DETECTED Final   Coronavirus 229E NOT DETECTED NOT DETECTED Final   Coronavirus HKU1 NOT DETECTED NOT DETECTED Final   Coronavirus NL63 NOT DETECTED NOT DETECTED Final   Coronavirus OC43 NOT DETECTED NOT DETECTED Final   Metapneumovirus NOT DETECTED NOT DETECTED Final   Rhinovirus / Enterovirus NOT DETECTED NOT DETECTED Final   Influenza A NOT DETECTED NOT DETECTED Final   Influenza B NOT DETECTED NOT DETECTED Final   Parainfluenza Virus 1 NOT DETECTED NOT DETECTED Final   Parainfluenza Virus 2 NOT DETECTED NOT DETECTED Final   Parainfluenza Virus 3 NOT DETECTED NOT DETECTED Final   Parainfluenza Virus 4 NOT DETECTED NOT DETECTED Final   Respiratory Syncytial Virus NOT DETECTED NOT DETECTED Final   Bordetella pertussis NOT DETECTED NOT DETECTED Final   Chlamydophila pneumoniae NOT DETECTED NOT DETECTED Final   Mycoplasma pneumoniae NOT DETECTED NOT DETECTED Final  MRSA PCR Screening     Status: None  Collection Time: 06/12/17 12:21 AM  Result Value Ref Range Status   MRSA by PCR NEGATIVE NEGATIVE Final    Comment:        The GeneXpert MRSA Assay (FDA approved for NASAL specimens only), is one component of a comprehensive MRSA colonization surveillance program. It is not intended to diagnose MRSA infection nor to guide or monitor treatment for MRSA infections.   C difficile quick scan w PCR reflex     Status: None   Collection Time: 06/12/17  1:00 AM  Result Value Ref Range Status   C Diff antigen NEGATIVE NEGATIVE Final   C Diff toxin NEGATIVE NEGATIVE Final   C Diff interpretation No C. difficile detected.  Final    Studies/Results: Ct Abdomen Pelvis Wo Contrast  Result Date: 06/12/2017 CLINICAL DATA:  71 year old male admitted yesterday with sepsis and altered mental status. EXAM: CT CHEST, ABDOMEN AND PELVIS WITHOUT CONTRAST TECHNIQUE: Multidetector CT imaging of the chest, abdomen  and pelvis was performed following the standard protocol without IV contrast. COMPARISON:  Portable chest 06/11/2017. FINDINGS: CT CHEST FINDINGS Cardiovascular: Vascular patency is not evaluated in the absence of IV contrast. Calcified coronary artery and descending thoracic aortic atherosclerosis. Cardiac size at the upper limits of normal. No pericardial effusion. Mediastinum/Nodes: Negative noncontrast thoracic inlet and mediastinum. No lymphadenopathy. Small volume retained secretions in the thoracic esophagus which is nondilated. Lungs/Pleura: Major airways are patent. Respiratory motion artifact mostly in lower lobes. Upper lobe paraseptal emphysema. Posterior lower lobe and costophrenic angle subpleural opacity which could reflect a combination of atelectasis and chronic pulmonary interstitial changes such as mild honeycombing. No pleural effusion. No consolidation. Musculoskeletal: Osteopenia. Mild chronic appearing T3 superior endplate compression. Chronic left posterior ninth rib fracture. No acute osseous abnormality identified. CT ABDOMEN PELVIS FINDINGS Hepatobiliary: Negative noncontrast liver and gallbladder. Pancreas: Negative. Spleen: Negative. Adrenals/Urinary Tract: Normal adrenal glands. Moderate to severe right and moderate left hydronephrosis. Associated bilateral extensive pararenal soft tissue stranding and mild expansion of the bilateral pararenal spaces. Thickening of the lateral Conal Foss show. Multiple punctate intrarenal calculi on the left. Questionable right midpole nephrolithiasis versus vascular calcification (coronal image 95). Associated bilateral hydroureter and periureteral stranding. The right ureter is abnormal to the level of a 5 x 8 mm calculus lodged just distal to the right ureteropelvic junction corresponding to the L3-L4 spinal level (series 3, image 87). Distal to this calculus there is persistent right periureteral stranding, but the right ureter is more decompressed.  The right UVJ appears normal, however within the right posterior bladder there is an oval 3 x 6 mm calculus (series 3, image 121 and coronal image 98. The left ureter remains abnormal to the ureterovesical junction where an oval 5 x 7 mm calculus is demonstrated on coronal image 101. The urinary bladder is fairly decompressed. No perivesical stranding. Stomach/Bowel: Bulky stool ball in the rectum (sagittal image 102). Redundant sigmoid colon is mildly gas distended and tracks into the epigastrium. The descending colon is decompressed with some retained stool. Trace fluid in the left gutter appears probably related to the left renal space. Negative splenic flexure and transverse colon. Mildly redundant hepatic flexure. Decompressed ascending colon. Trace fluid in the right gutter appears probably related to the right renal space. The right colon is redundant and the cecum is located on a lax mesentery in the right upper abdomen. Negative terminal ileum. Appendix is diminutive or absent. No dilated small bowel. Jejunum and ileum appear within normal limits. The duodenum appears mildly to  moderately thickened and inflamed (coronal image 77), but this is felt to be secondary to the right renal findings. Negative stomach. No abdominal free air. Trace abdominal free fluid, primarily in both pericolic gutters. Vascular/Lymphatic: Aortoiliac calcified atherosclerosis. Vascular patency is not evaluated in the absence of IV contrast. Infrarenal abdominal aorta measures up to 25 mm diameter. No lymphadenopathy. Reproductive: Negative. Other: No pelvic free fluid. Musculoskeletal: Previously augmented L1 compression fracture. Osteopenia. Advanced L5-S1 disc degeneration. No acute osseous abnormality identified. IMPRESSION: 1. Moderate to severe bilateral Obstructive Uropathy. - 5 x 8 mm proximal Right ureter calculus about 2 cm distal to the right renal pelvis. - 5 x 7 mm distal Left ureter calculus just proximal to the UVJ. -  a separate 6 mm calculus within the right urinary bladder probably is a recently passed stone. 2. Moderate to severe bilateral pararenal inflammatory changes. Consider Forniceal Rupture. 3. Secondary inflammatory involvement of the Duodenum, and small volume of free fluid in both pericolic gutters also thought to be secondary to the renal findings. 4. Mild pulmonary atelectasis possibly superimposed on chronic lower lobe interstitial disease. Mild Emphysema (ICD10-J43.9). 5. Bulky stool ball in the rectum.  Consider fecal impaction. 6. Aortic Atherosclerosis (ICD10-I70.0). Ectatic abdominal aorta at risk for aneurysm development. Recommend followup by ultrasound in 5 years. This recommendation follows ACR consensus guidelines: White Paper of the ACR Incidental Findings Committee II on Vascular Findings. J Am Coll Radiol 2013; 10:789-794. Electronically Signed   By: Odessa FlemingH  Hall M.D.   On: 06/12/2017 11:40   Ct Head Wo Contrast  Result Date: 06/12/2017 CLINICAL DATA:  Altered mental status, sepsis EXAM: CT HEAD WITHOUT CONTRAST TECHNIQUE: Contiguous axial images were obtained from the base of the skull through the vertex without intravenous contrast. COMPARISON:  None. FINDINGS: Brain: No evidence of acute infarction, hemorrhage, extra-axial collection, ventriculomegaly, or mass effect. Right basal ganglia lacunar infarct. Generalized cerebral atrophy. Periventricular white matter low attenuation likely secondary to microangiopathy. Vascular: Cerebrovascular atherosclerotic calcifications are noted. Skull: Negative for fracture or focal lesion. Sinuses/Orbits: Visualized portions of the orbits are unremarkable. Visualized portions of the paranasal sinuses and mastoid air cells are unremarkable. Other: None. IMPRESSION: 1. No acute intracranial pathology. 2. Chronic microvascular disease and cerebral atrophy. Electronically Signed   By: Elige KoHetal  Patel   On: 06/12/2017 11:22   Ct Chest Wo Contrast  Result Date:  06/12/2017 CLINICAL DATA:  71 year old male admitted yesterday with sepsis and altered mental status. EXAM: CT CHEST, ABDOMEN AND PELVIS WITHOUT CONTRAST TECHNIQUE: Multidetector CT imaging of the chest, abdomen and pelvis was performed following the standard protocol without IV contrast. COMPARISON:  Portable chest 06/11/2017. FINDINGS: CT CHEST FINDINGS Cardiovascular: Vascular patency is not evaluated in the absence of IV contrast. Calcified coronary artery and descending thoracic aortic atherosclerosis. Cardiac size at the upper limits of normal. No pericardial effusion. Mediastinum/Nodes: Negative noncontrast thoracic inlet and mediastinum. No lymphadenopathy. Small volume retained secretions in the thoracic esophagus which is nondilated. Lungs/Pleura: Major airways are patent. Respiratory motion artifact mostly in lower lobes. Upper lobe paraseptal emphysema. Posterior lower lobe and costophrenic angle subpleural opacity which could reflect a combination of atelectasis and chronic pulmonary interstitial changes such as mild honeycombing. No pleural effusion. No consolidation. Musculoskeletal: Osteopenia. Mild chronic appearing T3 superior endplate compression. Chronic left posterior ninth rib fracture. No acute osseous abnormality identified. CT ABDOMEN PELVIS FINDINGS Hepatobiliary: Negative noncontrast liver and gallbladder. Pancreas: Negative. Spleen: Negative. Adrenals/Urinary Tract: Normal adrenal glands. Moderate to severe right and moderate left hydronephrosis. Associated  bilateral extensive pararenal soft tissue stranding and mild expansion of the bilateral pararenal spaces. Thickening of the lateral Conal Foss show. Multiple punctate intrarenal calculi on the left. Questionable right midpole nephrolithiasis versus vascular calcification (coronal image 95). Associated bilateral hydroureter and periureteral stranding. The right ureter is abnormal to the level of a 5 x 8 mm calculus lodged just distal  to the right ureteropelvic junction corresponding to the L3-L4 spinal level (series 3, image 87). Distal to this calculus there is persistent right periureteral stranding, but the right ureter is more decompressed. The right UVJ appears normal, however within the right posterior bladder there is an oval 3 x 6 mm calculus (series 3, image 121 and coronal image 98. The left ureter remains abnormal to the ureterovesical junction where an oval 5 x 7 mm calculus is demonstrated on coronal image 101. The urinary bladder is fairly decompressed. No perivesical stranding. Stomach/Bowel: Bulky stool ball in the rectum (sagittal image 102). Redundant sigmoid colon is mildly gas distended and tracks into the epigastrium. The descending colon is decompressed with some retained stool. Trace fluid in the left gutter appears probably related to the left renal space. Negative splenic flexure and transverse colon. Mildly redundant hepatic flexure. Decompressed ascending colon. Trace fluid in the right gutter appears probably related to the right renal space. The right colon is redundant and the cecum is located on a lax mesentery in the right upper abdomen. Negative terminal ileum. Appendix is diminutive or absent. No dilated small bowel. Jejunum and ileum appear within normal limits. The duodenum appears mildly to moderately thickened and inflamed (coronal image 77), but this is felt to be secondary to the right renal findings. Negative stomach. No abdominal free air. Trace abdominal free fluid, primarily in both pericolic gutters. Vascular/Lymphatic: Aortoiliac calcified atherosclerosis. Vascular patency is not evaluated in the absence of IV contrast. Infrarenal abdominal aorta measures up to 25 mm diameter. No lymphadenopathy. Reproductive: Negative. Other: No pelvic free fluid. Musculoskeletal: Previously augmented L1 compression fracture. Osteopenia. Advanced L5-S1 disc degeneration. No acute osseous abnormality identified.  IMPRESSION: 1. Moderate to severe bilateral Obstructive Uropathy. - 5 x 8 mm proximal Right ureter calculus about 2 cm distal to the right renal pelvis. - 5 x 7 mm distal Left ureter calculus just proximal to the UVJ. - a separate 6 mm calculus within the right urinary bladder probably is a recently passed stone. 2. Moderate to severe bilateral pararenal inflammatory changes. Consider Forniceal Rupture. 3. Secondary inflammatory involvement of the Duodenum, and small volume of free fluid in both pericolic gutters also thought to be secondary to the renal findings. 4. Mild pulmonary atelectasis possibly superimposed on chronic lower lobe interstitial disease. Mild Emphysema (ICD10-J43.9). 5. Bulky stool ball in the rectum.  Consider fecal impaction. 6. Aortic Atherosclerosis (ICD10-I70.0). Ectatic abdominal aorta at risk for aneurysm development. Recommend followup by ultrasound in 5 years. This recommendation follows ACR consensus guidelines: White Paper of the ACR Incidental Findings Committee II on Vascular Findings. J Am Coll Radiol 2013; 10:789-794. Electronically Signed   By: Odessa Fleming M.D.   On: 06/12/2017 11:40   Ir Nephrostomy Placement Left  Result Date: 06/12/2017 INDICATION: 71 year old with sepsis and bilateral obstructing ureter calculi. Patient needs urgent renal decompression. EXAM: PLACEMENT OF LEFT PERCUTANEOUS NEPHROSTOMY TUBE WITH ULTRASOUND AND FLUOROSCOPIC GUIDANCE PLACEMENT OF RIGHT PERCUTANEOUS NEPHROSTOMY TUBE WITH ULTRASOUND AND FLUOROSCOPIC GUIDANCE COMPARISON:  None. MEDICATIONS: Ciprofloxacin 400 mg; The antibiotic was administered in an appropriate time frame prior to skin puncture. ANESTHESIA/SEDATION: Fentanyl  25 mcg IV; Versed 1.0 mg IV Moderate Sedation Time:  30 minutes The patient was continuously monitored during the procedure by the interventional radiology nurse under my direct supervision. CONTRAST:  15 mL - administered into the collecting system(s) FLUOROSCOPY TIME:   Fluoroscopy Time: 2 minutes 18 seconds (8 mGy). COMPLICATIONS: None immediate. PROCEDURE: Informed written consent was obtained from the patient after a thorough discussion of the procedural risks, benefits and alternatives. All questions were addressed. Maximal Sterile Barrier Technique was utilized including caps, mask, sterile gowns, sterile gloves, sterile drape, hand hygiene and skin antiseptic. A timeout was performed prior to the initiation of the procedure. Patient was placed prone. Both flanks were prepped and draped in a sterile fashion. Left flank was anesthetized with 1% lidocaine. 22 gauge Chiba needle was dilated into a mid/ lower pole calyx with ultrasound guidance. Wire was easily advanced in the renal collecting system. Accustick dilator set was placed. Tract was dilated to accommodate a 10.2 [place] multipurpose drain. Blood tinged urine was collected and sent for culture. Small amount of contrast confirmed placement in the renal collecting system. Catheter was sutured to skin. Right flank was anesthetized with 1% lidocaine. Using ultrasound guidance, 22 gauge needle was directed into a mid/ lower pole calyx. Small amount of purulent fluid was draining from the needle. 0.018 wire was advanced to the renal pelvis. Accustick dilator set was placed and the tract was dilated to accommodate a 10.2 [place] multipurpose drain. Bloody fluid was aspirated and sent for culture. Small amount of contrast confirmed placement in the renal collecting system. Catheter was sutured to skin. Both tubes were connected to gravity bags. Fluoroscopic and ultrasound images were taken and saved for documentation. FINDINGS: Moderate left hydronephrosis. Left nephrostomy tube is well positioned in the left renal pelvis. Moderate-severe right hydronephrosis. Obstructing stone in the proximal right ureter. Nephrostomy tube is reconstituted in the right renal pelvis. Bloody fluid was removed from the right renal collecting  system. IMPRESSION: Successful bilateral percutaneous nephrostomy tubes using ultrasound and fluoroscopic guidance. Bilateral nephrostomy tube samples were sent for culture. Electronically Signed   By: Richarda Overlie M.D.   On: 06/12/2017 17:50   Ir Nephrostomy Placement Right  Result Date: 06/12/2017 INDICATION: 71 year old with sepsis and bilateral obstructing ureter calculi. Patient needs urgent renal decompression. EXAM: PLACEMENT OF LEFT PERCUTANEOUS NEPHROSTOMY TUBE WITH ULTRASOUND AND FLUOROSCOPIC GUIDANCE PLACEMENT OF RIGHT PERCUTANEOUS NEPHROSTOMY TUBE WITH ULTRASOUND AND FLUOROSCOPIC GUIDANCE COMPARISON:  None. MEDICATIONS: Ciprofloxacin 400 mg; The antibiotic was administered in an appropriate time frame prior to skin puncture. ANESTHESIA/SEDATION: Fentanyl 25 mcg IV; Versed 1.0 mg IV Moderate Sedation Time:  30 minutes The patient was continuously monitored during the procedure by the interventional radiology nurse under my direct supervision. CONTRAST:  15 mL - administered into the collecting system(s) FLUOROSCOPY TIME:  Fluoroscopy Time: 2 minutes 18 seconds (8 mGy). COMPLICATIONS: None immediate. PROCEDURE: Informed written consent was obtained from the patient after a thorough discussion of the procedural risks, benefits and alternatives. All questions were addressed. Maximal Sterile Barrier Technique was utilized including caps, mask, sterile gowns, sterile gloves, sterile drape, hand hygiene and skin antiseptic. A timeout was performed prior to the initiation of the procedure. Patient was placed prone. Both flanks were prepped and draped in a sterile fashion. Left flank was anesthetized with 1% lidocaine. 22 gauge Chiba needle was dilated into a mid/ lower pole calyx with ultrasound guidance. Wire was easily advanced in the renal collecting system. Accustick dilator set was placed. Tract was  dilated to accommodate a 10.2 [place] multipurpose drain. Blood tinged urine was collected and sent for  culture. Small amount of contrast confirmed placement in the renal collecting system. Catheter was sutured to skin. Right flank was anesthetized with 1% lidocaine. Using ultrasound guidance, 22 gauge needle was directed into a mid/ lower pole calyx. Small amount of purulent fluid was draining from the needle. 0.018 wire was advanced to the renal pelvis. Accustick dilator set was placed and the tract was dilated to accommodate a 10.2 [place] multipurpose drain. Bloody fluid was aspirated and sent for culture. Small amount of contrast confirmed placement in the renal collecting system. Catheter was sutured to skin. Both tubes were connected to gravity bags. Fluoroscopic and ultrasound images were taken and saved for documentation. FINDINGS: Moderate left hydronephrosis. Left nephrostomy tube is well positioned in the left renal pelvis. Moderate-severe right hydronephrosis. Obstructing stone in the proximal right ureter. Nephrostomy tube is reconstituted in the right renal pelvis. Bloody fluid was removed from the right renal collecting system. IMPRESSION: Successful bilateral percutaneous nephrostomy tubes using ultrasound and fluoroscopic guidance. Bilateral nephrostomy tube samples were sent for culture. Electronically Signed   By: Richarda Overlie M.D.   On: 06/12/2017 17:50      Shiesha Jahn C 06/13/2017, 7:27 AM

## 2017-06-13 NOTE — Progress Notes (Signed)
OT Evaluation  PTA, pt lived at home with his wife, who provided his care, including assisting pt with transfers and ADL tasks.Pt apparently went to [facility] 3 days/wk and wife has a PCA 2 days/wk.  Wife states pt demonstrates a significant functional status change. VSS during session. At this time, feel pt would benefit from rehab at SNF to return to PLOF to facilitate safe DC home with wife assisting with care. Will follow acutely to facilitate safe DC to next venue of care    06/13/17 1700  OT Visit Information  Last OT Received On 06/13/17  Assistance Needed +2  PT/OT/SLP Co-Evaluation/Treatment Yes  Reason for Co-Treatment Complexity of the patient's impairments (multi-system involvement);For patient/therapist safety  OT goals addressed during session ADL's and self-care  History of Present Illness pt is a 71 y/o male with pmh significant for DM, CAD, dementia, CVA, PAD s/p L BKA and R transmet amputation and alcohol use d/o , presenting with sepsis.  Work up findings includ bacteremia due to ascending obstructive pyelonephritis, s/p nephrostomy tubes 12/20.  Precautions  Precautions Fall  Precaution Comments drainage tubes; at risk for skin breakdown  Home Living  Family/patient expects to be discharged to: Skilled nursing facility  Additional Comments [facility] program participant  Prior Function  Level of [place] Needs assistance  Gait / Transfers Assistance Needed Limited to transfers with assist of wife. Wife reports "on good days" pt able to perform slide board transfers with assist. On bad days; wife performs squat pivot transfer.  ADL's / Homemaking Assistance Needed Wife assisting with all ADL; including self feeding at times.  Communication  Communication Expressive difficulties  Pain Assessment  Pain Assessment Faces  Faces Pain Scale 4  Pain Location L shoulder, neck, back  Pain Descriptors / Indicators Aching  Pain Intervention(s) Limited activity within patient's  tolerance;Repositioned  Cognition  Arousal/Alertness Awake/alert  Behavior During Therapy Flat affect  Overall Cognitive Status Impaired/Different from baseline  Area of Impairment Orientation;Attention;Memory;Following commands;Safety/judgement;Awareness;Problem solving  Orientation Level Disoriented to;Time;Place;Situation  Current Attention Level Sustained  Memory Decreased recall of precautions;Decreased short-term memory  Following Commands Follows one step commands inconsistently  Safety/Judgement Decreased awareness of safety;Decreased awareness of deficits  Awareness Intellectual  Problem Solving Slow processing;Decreased initiation;Difficulty sequencing  Upper Extremity Assessment  Upper Extremity Assessment LUE deficits/detail  LUE Deficits / Details L shoulder weakness at baeline; general RUE weakness  Lower Extremity Assessment  Lower Extremity Assessment Defer to PT evaluation  Cervical / Trunk Assessment  Cervical / Trunk Assessment Kyphotic (unable to maintain midline postural control sitting EOB)  ADL  Overall ADL's  Needs assistance/impaired  Functional mobility during ADLs Maximal assistance  General ADL Comments Pt assists at times with ADL at baseline. Pt currently total A with ADL with exeption of being able to wipe face with min A.   Bed Mobility  Overal bed mobility Needs Assistance  Bed Mobility Supine to Sit;Sit to Supine  Rolling Max assist  Supine to sit Max assist  Sit to supine Max assist;+2 for safety/equipment  General bed mobility comments pt initiated returning to sidelying with weight bearing through RUE  Transfers  General transfer comment Max A with PT  Balance  Overall balance assessment Needs assistance  Sitting balance-Leahy Scale Zero  Sitting balance - Comments L lateral lean  Standing balance-Leahy Scale Zero  OT - End of Session  Activity Tolerance Patient tolerated treatment well  Patient left in bed;with call bell/phone within  reach;with bed alarm set;with family/visitor present  Nurse  Communication Mobility status  OT Assessment  OT Recommendation/Assessment Patient needs continued OT Services  OT Visit Diagnosis Muscle weakness (generalized) (M62.81);Other symptoms and signs involving cognitive function;Other abnormalities of gait and mobility (R26.89)  OT Problem List Decreased strength;Decreased activity tolerance;Impaired balance (sitting and/or standing);Decreased cognition;Decreased safety awareness;Decreased knowledge of use of DME or AE  OT Plan  OT Frequency (ACUTE ONLY) Min 2X/week  OT Treatment/Interventions (ACUTE ONLY) Self-care/ADL training;Therapeutic exercise;DME and/or AE instruction;Therapeutic activities;Cognitive remediation/compensation;Patient/family education;Balance training  AM-PAC OT "6 Clicks" Daily Activity Outcome Measure  Help from another person eating meals? 2  Help from another person taking care of personal grooming? 2  Help from another person toileting, which includes using toliet, bedpan, or urinal? 1  Help from another person bathing (including washing, rinsing, drying)? 2  Help from another person to put on and taking off regular upper body clothing? 1  Help from another person to put on and taking off regular lower body clothing? 1  6 Click Score 9  ADL G Code Conversion CL  OT Recommendation  Follow Up Recommendations SNF;Supervision/Assistance - 24 hour;Other (comment)  OT Equipment None recommended by OT  Individuals Consulted  Consulted and Agree with Results and Recommendations Family member/caregiver  Family Member Consulted wife  Acute Rehab OT Goals  Patient Stated Goal wifes goal is for pt to return home wiht her care  OT Goal Formulation With patient/family  Time For Goal Achievement 06/27/17  Potential to Achieve Goals Good  OT Time Calculation  OT Start Time (ACUTE ONLY) 1520  OT Stop Time (ACUTE ONLY) 1540  OT Time Calculation (min) 20 min  OT General  Charges  $OT Visit 1 Visit  OT Evaluation  $OT Eval Moderate Complexity 1 Mod  Written Expression  Dominant Hand Right  [facility] LLC[facility] Ryleeann Urquiza, OT/L  [phone] 06/13/2017

## 2017-06-13 NOTE — Progress Notes (Signed)
Notified MD that patient has back pain and wears Lidoderm patches at home.

## 2017-06-13 NOTE — Progress Notes (Signed)
[facility]. Pt's [facility] had [name] is Administrator, artsmily Scearce. [facility]'s telephone number is [phone] should she need to be contacted regarding discharge disposition. Kristi, CM updated.   Alan Rombergaitlin [name], RN

## 2017-06-13 NOTE — Progress Notes (Signed)
CRITICAL VALUE ALERT  Critical Value:  Vancomycin Trough 22  Date & Time Notied:  06/13/17  01:55  Provider Notified: Pharmacy notified

## 2017-06-13 NOTE — Discharge Summary (Signed)
Name: Alan [name]. MRN: [id number] DOB: 06-07-1946 72 y.o. PCP: Angelica Pou, MD  Date of Admission: 06/11/2017  9:52 AM Date of Discharge:  Attending Physician: Annia Belt, MD  Discharge Diagnosis: 1. Sepsis secondary to Staph bacteremia and fungemia  2. Bilateral obstructive nephrolithiasis 3. AKI   Principal Problem:   Sepsis due to coagulase-negative staphylococcal infection ([place]) Active Problems:   Obstruction of kidney   Candidemia ([place])   History of nephrostomy ([facility])   Candida tropicalis infection   Bilateral nephrolithiasis  Discharge Medications: Allergies as of 06/23/2017      Reactions   Penicillins Rash   Has patient had a PCN reaction causing immediate rash, facial/tongue/throat swelling, SOB or lightheadedness with hypotension: Yes Has patient had a PCN reaction causing severe rash involving mucus membranes or skin necrosis: No Has patient had a PCN reaction that required hospitalization No Has patient had a PCN reaction occurring within the last 10 years: No If all of the above answers are "NO", then may proceed with Cephalosporin use.      Medication List    STOP taking these medications   oxyCODONE-acetaminophen 5-325 MG tablet Commonly known as:  PERCOCET/ROXICET     TAKE these medications   acetaminophen 650 MG CR tablet Commonly known as:  TYLENOL Take 650 mg by mouth 3 (three) times daily.   anidulafungin IVPB Commonly known as:  ERAXIS Inject 100 mg into the vein daily. Indication: candidemia Last Day of Therapy:  06/29/17 Labs - Once weekly:  CBC/D and BMP, Labs - Every other week:  ESR and CRP   aspirin EC 81 MG tablet Take 81 mg by mouth daily.   atorvastatin 10 MG tablet Commonly known as:  LIPITOR Take 10 mg by mouth every evening.   fluticasone 50 MCG/ACT nasal spray Commonly known as:  FLONASE Place 1 spray into the nose daily as needed for rhinitis.   insulin glargine 100 UNIT/ML injection Commonly  known as:  LANTUS Inject 40 Units into the skin at bedtime.   lidocaine 5 % Commonly known as:  LIDODERM Place 1 patch onto the skin daily. Remove & Discard patch within 12 hours or as directed by MD   loperamide 2 MG capsule Commonly known as:  IMODIUM Take 2 capsules (4 mg total) by mouth every 6 (six) hours as needed for diarrhea or loose stools.   LORazepam 1 MG tablet Commonly known as:  ATIVAN Take 0.5 tablets (0.5 mg total) by mouth 2 (two) times daily. What changed:  how much to take   Melatonin 10 MG Tabs Take 10 mg by mouth at bedtime.   metFORMIN 500 MG tablet Commonly known as:  GLUCOPHAGE Take 500 mg by mouth 2 (two) times daily with a meal.   nystatin 100000 UNIT/ML suspension Commonly known as:  MYCOSTATIN Take 5 mLs (500,000 Units total) by mouth 4 (four) times daily for 6 days.   sertraline 50 MG tablet Commonly known as:  ZOLOFT Take 50 mg by mouth 2 (two) times daily.   tamsulosin 0.4 MG Caps capsule Commonly known as:  FLOMAX Take 0.4 mg by mouth every evening.   traZODone 50 MG tablet Commonly known as:  DESYREL TAKE ONE TABLET (50 MG TOTAL) BY MOUTH AT BEDTIME.   vancomycin IVPB Inject 1,250 mg into the vein daily for 10 days. Indication:  bacteremia Last Day of Therapy:  06/29/17 Labs - Sunday/Monday:  CBC/D, BMP, and vancomycin trough. Labs - Thursday:  BMP and vancomycin  trough Labs - Every other week:  ESR and CRP            Home Infusion Instuctions  (From admission, onward)        Start     Ordered   06/23/17 0000  Home infusion instructions [facility] May follow [place] Dosing Protocol; May administer Cathflo as needed to maintain patency of vascular access device.; Flushing of vascular access device: per [facility] Protocol: 0.9% NaCl pre/post medica...    Question Answer Comment  Instructions May follow [place] Dosing Protocol   Instructions May administer Cathflo as needed to maintain patency of vascular access  device.   Instructions Flushing of vascular access device: per [facility] Protocol: 0.9% NaCl pre/post medication administration and prn patency; Heparin 100 u/ml, 59m for implanted ports and Heparin 10u/ml, 527mfor all other central venous catheters.   Instructions May follow [facility] Anaphylaxis Protocol for First Dose Administration in the home: 0.9% NaCl at 25-50 ml/hr to maintain IV access for protocol meds. Epinephrine 0.3 ml IV/IM PRN and Benadryl 25-50 IV/IM PRN s/s of anaphylaxis.   Instructions [facility] Infusion Coordinator (RN) to assist per patient IV care needs in the home PRN.      06/23/17 1108      Disposition and follow-up:   [facility] BeRondel [facility]was [facility]  At the hospital follow up visit please address:  1.  Please address the patients chronic medical conditions as well as his recent episode of bilateral ureteral obstruction from renal caliculi resulting in retrograde bacteriemia and sepsis.        AKI-please evaluate renal function for continued normal serum creatinine as he is on vancomycin.       The patient will need close follow-up with a urologist for removal of bilateral nephrostomy tubes. Please ensure he has scheduled follow up with urology.   2.  Labs / imaging needed at time of follow-up: BMP for Cr and potassium  3.  Pending labs/ test needing follow-up: none   Follow-up Appointments:  Contact information for follow-up providers    Call OtKathie RhodesMD.   Specialty:  Urology Why:  For an appointment in 1-2 weeks when you get home. Contact information: [address] [phone]        WiAngelica PouMD. Schedule an appointment as soon as possible for a visit.   Specialty:  Internal Medicine Contact information: [phone]      GrDebbra RidingMD. Schedule an appointment as soon as possible for a visit.     Specialty:  Ophthalmology Contact information: [address] [phone]          Contact information for after-discharge care    [facility] .   Service:  Skilled Nursing Contact information: [address] by problem list: Principal Problem:   Sepsis due to coagulase-negative staphylococcal infection ([facility]:   Obstruction of kidney   Candidemia ([facility]  [place] ([place]  Candida tropicalis infection   Bilateral nephrolithiasis   Mr. BePetteways a [age].o male with severe vascular disease and vascular dementia who presented with altered  mental status and was found to be septic. Work-up reveal Coag negative staphbacteremia and Fungemia(Candida Tropicalis).  # Coagulase negative staphylococcus bacteremia and Candidemia 2/2 obstructive nephrolithiasis:  Patient presented with AMS and found to be febrile and have a leukocytosis and AKI. Blood cultures were positive for Coag neg Staph and Candida Tropicalis and he was started on antibiotic therapy with vancomycin and Eraxis. The source of his bacteremia was felt to be obstructive nephrolithiasis (see below). He quickly improved after starting antibiotic therapy. Echocardiogram was obtained which did not show any signs of infective endocarditis. ID was consulted who recommend 14 days of antibiotic and antifungal after sterile culture were obtained. The patient did get an opthalmologic exam that did not reveal any ocular involvement. Repeat blood cx on 12/28 were negative. Patient will be on Vancomycin and Eraxis until 1/5. He was discharged to SNF in stable condition and his mental status was at baseline.    # AKI. Patient was initially admitted with an elevated creatinine of 2.64 and found to have bilateral hydronephrosis secondary to obstructive  nephrolithiasis. Urology was consulted who recommended bilateral nephrostomy tube placement and outpatient follow-up after his acute illness. He later went for internalization of nephrostomy tubes 12/28. His renal function returned to baseline. He will need outpatient follow up with urology at 2-4 weeks after discharge for removal of nephrostomy tubes.   Patient's discharge medications were discussed with his PCP, Dr. Jimmye Norman.   Discharge Vitals:   BP 125/66 (BP Location: Left Arm)   Pulse 74   Temp 98.4 F (36.9 C) (Oral)   Resp 18   Ht 6' (1.829 m)   Wt 166 lb 7.2 oz (75.5 kg)   SpO2 97%   BMI 22.57 kg/m   Pertinent Labs, Studies, and Procedures:  CBC Latest Ref Rng & Units 06/21/2017 06/20/2017 06/19/2017  WBC 4.0 - 10.5 K/uL 8.7 9.7 8.5  Hemoglobin 13.0 - 17.0 g/dL 9.8(L) 10.1(L) 10.0(L)  Hematocrit 39.0 - 52.0 % 31.1(L) 31.3(L) 31.5(L)  Platelets 150 - 400 K/uL 333 266 259   CMP Latest Ref Rng & Units 06/21/2017 06/20/2017 06/19/2017  Glucose 65 - 99 mg/dL 172(H) 72 112(H)  BUN 6 - 20 mg/dL '11 12 13  '$ Creatinine 0.61 - 1.24 mg/dL 0.84 0.71 0.65  Sodium 135 - 145 mmol/L 134(L) 139 140  Potassium 3.5 - 5.1 mmol/L 3.5 3.2(L) 3.6  Chloride 101 - 111 mmol/L 99(L) 105 106  CO2 22 - 32 mmol/L '26 27 27  '$ Calcium 8.9 - 10.3 mg/dL 8.2(L) 8.1(L) 8.0(L)  Total Protein 6.5 - 8.1 g/dL - - -  Total Bilirubin 0.3 - 1.2 mg/dL - - -  Alkaline Phos 38 - 126 U/L - - -  AST 15 - 41 U/L - - -  ALT 17 - 63 U/L - - -   Discharge Instructions: Discharge Instructions    Call MD for:  difficulty breathing, headache or visual disturbances   Complete by:  As directed    Call MD for:  persistant dizziness or light-headedness   Complete by:  As directed    Call MD for:  redness, tenderness, or signs of infection (pain, swelling, redness, odor or green/yellow discharge around incision site)   Complete by:  As directed    Call MD for:  temperature >100.4   Complete by:  As directed    Discharge  instructions   Complete by:  As directed    You will need vancomycin and Eraxis through the PICC line until January 6.  The nephrostomy tubes will have to stay in for 2-4 weeks. The urology office will contact you to schedule a follow up appointment with you to remove the tubes. Dr. Jimmye Norman from [place] will continue to follow up with you. Please return to the ED if you become febrile or develop confusion again.   Home infusion instructions [facility] May follow [place] Dosing Protocol; May administer Cathflo as needed to maintain patency of vascular access device.; Flushing of vascular access device: per [facility] Protocol: 0.9% NaCl pre/post medica...   Complete by:  As directed    Instructions:  May follow [place] Dosing Protocol   Instructions:  May administer Cathflo as needed to maintain patency of vascular access device.   Instructions:  Flushing of vascular access device: per [facility] Protocol: 0.9% NaCl pre/post medication administration and prn patency; Heparin 100 u/ml, 12m for implanted ports and Heparin 10u/ml, 556mfor all other central venous catheters.   Instructions:  May follow [facility] Anaphylaxis Protocol for First Dose Administration in the home: 0.9% NaCl at 25-50 ml/hr to maintain IV access for protocol meds. Epinephrine 0.3 ml IV/IM PRN and Benadryl 25-50 IV/IM PRN s/s of anaphylaxis.   Instructions:  [facility] Coordinator (RN) to assist per patient IV care needs in the home PRN.   Increase activity slowly   Complete by:  As directed       Head CT: IMPRESSION: 1. No acute intracranial pathology. 2. Chronic microvascular disease and cerebral atrophy.  Chest CT + abdomen/pelvis: IMPRESSION: 1. Moderate to severe bilateral Obstructive Uropathy. - 5 x 8 mm proximal Right ureter calculus about 2 cm distal to the right renal pelvis. - 5 x 7 mm distal Left ureter calculus just proximal to the UVJ. - a separate 6 mm calculus within the right urinary bladder  probably is a recently passed stone. 2. Moderate to severe bilateral pararenal inflammatory changes. Consider Forniceal Rupture. 3. Secondary inflammatory involvement of the Duodenum, and small volume of free fluid in both pericolic gutters also thought to be secondary to the renal findings. 4. Mild pulmonary atelectasis possibly superimposed on chronic lower lobe interstitial disease. Mild Emphysema (ICD10-J43.9). 5. Bulky stool ball in the rectum.  Consider fecal impaction. 6. Aortic Atherosclerosis (ICD10-I70.0). Ectatic abdominal aorta at risk for aneurysm development. Recommend followup by ultrasound in 5 years.    Signed: Welford RocheMD 06/23/2017, 11:08 AM   [phone]

## 2017-06-13 NOTE — Progress Notes (Addendum)
Inpatient Diabetes Program Recommendations  AACE/ADA: New Consensus Statement on Inpatient Glycemic Control (2015)  Target Ranges:  Prepandial:   less than 140 mg/dL      Peak postprandial:   less than 180 mg/dL (1-2 hours)      Critically ill patients:  140 - 180 mg/dL   Lab Results  Component Value Date   GLUCAP 314 (H) 06/13/2017   HGBA1C 9.5 (H) 10/30/2016   Review of Glycemic Control  Diabetes history: Type 2 DM Outpatient Diabetes medications:  Metformin 500 mg bid, Lantus 40 units q HS Current orders for Inpatient glycemic control:  Novolog sensitive tid with meals, Lantus 30 units q HS  Inpatient Diabetes Program Recommendations:    Patient has order for Lantus 30 units, increased from 20 units yesterday. However, patient is ordered to receive tonight. Consider ordering one time dose of 10 units of Lantus this am for glucose control. Consider adding Novolog HS scale 0-5 units.  Thanks,  Christena DeemShannon Nguyen Todorov RN, MSN, [facility] Inpatient Diabetes Coordinator Team Pager [phone] (8a-5p)

## 2017-06-13 NOTE — Progress Notes (Signed)
Modified Barium Swallow Progress Note  Patient Details  Name: Alan SeedsGlenn A Rutkowski Jr. MRN: [id number] Date of Birth: 02-06-1946  Today's Date: 06/13/2017  Modified Barium Swallow completed.  Full report located under Chart Review in the Imaging Section.  Brief recommendations include the following:  Clinical Impression  Pt demonstrates a severe oropharyngeal dysphagia secondary to cognition, poor trunk/head support and apparent neuromuscular weakness. Pt requires total assist for feeding, head is tilted right with pt inability to hold upright despite max support and assist. Given positioning, straw sips were best consumed, even with honey thick liquids. Primary concern is lack of swallow initiation on about 50% of bolsues with liquidsand solids pooling in the pharynx without response from pt. Sometimes pt automatically swallows, sometimes he does not swallow despite max cues, sometimes verbal cues appear to help. Regardless, pt experiences sensed aspiration (material not ejected) with nectar thick liquids and high risk of eventual aspiration with puree and honey. Moderate residue also present post swallow due to base of tongue weakness.  If pt were to initiate a lest restricitve diet that may reduce aspiration events, would suggest puree (dys 1) with honey thick liquids which can be taken by a straw. No family present for education, but discussed results with MD.    Tenna ChildSwallow Evaluation Recommendations       SLP Diet Recommendations: Dysphagia 1 (Puree) solids;Honey thick liquids   Liquid Administration via: Straw   Medication Administration: Crushed with puree   Supervision: Patient able to self feed   Compensations: Minimize environmental distractions;Multiple dry swallows after each bite/sip;Other (Comment)       Oral Care Recommendations: Oral care BID   Other Recommendations: Order thickener from pharmacy   [facility] SystemBonnie Amairany Schumpert, MA CCC-SLP [phone]  Claudine MoutonDeBlois, Jonette Wassel  Caroline 06/13/2017,3:33 PM

## 2017-06-13 NOTE — Progress Notes (Signed)
  Echocardiogram 2D Echocardiogram has been performed.  Roosvelt MaserLane, Denielle Bayard F 06/13/2017, 4:53 PM

## 2017-06-13 NOTE — Progress Notes (Signed)
  Date: 06/13/2017  Patient name: Margaretha SeedsGlenn A Pascua Jr.  Medical record number: [id number]  Date of birth: 1945-10-31   I have seen and evaluated this patient and I have discussed the plan of care with the house staff. Please see Dr. Godfrey PickHarbrecht's note for complete details. I concur with his findings with the following additions/corrections:   Reviewed SLP note, plan for MBS for the patient.  He is improved now with source control.  Nephrostomy tubes draining clear yellowish fluid this morning bilaterally.   [name], Emily B, MD 06/13/2017, 1:56 PM

## 2017-06-13 NOTE — Progress Notes (Signed)
Referring Physician(s): Dr Raylene MiyamotoM Ottelin  Supervising Physician: Simonne ComeWatts, John  [facility]  [facility]  Chief Complaint:  B Hydro Sepsis   Subjective:  12/20 procedure note: Pre-operative Diagnosis:  Sepsis and bilateral hydronephrosis from obstructive ureteral stones                     Post-operative Diagnosis: Same Indications: Sepsis Procedure: Bilateral nephrostomy tube placement Findings: Moderate left hydro.  Moderate-Severe right hydro.  Placement of bilateral nephrostomy tubes.  Blood tinged cloudy fluid removed from left kidney.   Bloody and mildly purulent fluid from right kidney.  Today pt is so much better Talking to me Alert No complaints Wife at bedside----so happy he is better  Allergies: Penicillins  Medications: Prior to Admission medications   Medication Sig Start Date End Date Taking? Authorizing Provider  acetaminophen (TYLENOL) 650 MG CR tablet Take 650 mg by mouth 3 (three) times daily.    Yes [provider]  aspirin EC 81 MG tablet Take 81 mg by mouth daily.   Yes [provider]  atorvastatin (LIPITOR) 10 MG tablet Take 10 mg by mouth every evening.   Yes [provider]  insulin glargine (LANTUS) 100 UNIT/ML injection Inject 40 Units into the skin at bedtime.  06/11/16  Yes [provider]  lidocaine (LIDODERM) 5 % Place 1 patch onto the skin daily. Remove & Discard patch within 12 hours or as directed by MD   Yes [provider]  LORazepam (ATIVAN) 1 MG tablet Take 1 mg by mouth 2 (two) times daily.   Yes [provider]  Melatonin 10 MG TABS Take 10 mg by mouth at bedtime.   Yes [provider]  metFORMIN (GLUCOPHAGE) 500 MG tablet Take 500 mg by mouth 2 (two) times daily with a meal.   Yes [provider]  sertraline (ZOLOFT) 50 MG tablet Take 50 mg by mouth 2 (two) times daily.    Yes [provider]  tamsulosin (FLOMAX) 0.4 MG CAPS capsule Take 0.4 mg by  mouth every evening.   Yes [provider]  traZODone (DESYREL) 50 MG tablet TAKE ONE TABLET (50 MG TOTAL) BY MOUTH AT BEDTIME. 09/11/16  Yes [provider]  fluticasone (FLONASE) 50 MCG/ACT nasal spray Place 1 spray into the nose daily as needed for rhinitis.  01/01/16 12/31/16  [provider]  oxyCODONE-acetaminophen (PERCOCET/ROXICET) 5-325 MG tablet Take 1 tablet by mouth every 6 (six) hours as needed for moderate pain. Patient not taking: Reported on 06/11/2017 10/31/16   Lars Mageollins, Emma M, PA-C     Vital Signs: BP 136/86 (BP Location: Left Arm)   Pulse (!) 163   Temp 98.1 F (36.7 C) (Oral)   Resp 19   Ht 6' (1.829 m)   Wt 161 lb 2.5 oz (73.1 kg)   SpO2 100%   BMI 21.86 kg/m   Physical Exam  Abdominal: Soft.  Neurological: He is alert.  Skin: Skin is warm and dry.  B PCN sites are clean and dry  Left OP is great Clear yellow urine: 2 L   Right OP; blood tinged; clots in bag 500 cc recorded RN flushing now 5 cc every shift  Nursing note and vitals reviewed.   Imaging: Ct Abdomen Pelvis Wo Contrast  Result Date: 06/12/2017 CLINICAL DATA:  71 year old male admitted yesterday with sepsis and altered mental status. EXAM: CT CHEST, ABDOMEN AND PELVIS WITHOUT CONTRAST TECHNIQUE: Multidetector CT imaging of the chest,  abdomen and pelvis was performed following the standard protocol without IV contrast. COMPARISON:  Portable chest 06/11/2017. FINDINGS: CT CHEST FINDINGS Cardiovascular: Vascular patency is not evaluated in the absence of IV contrast. Calcified coronary artery and descending thoracic aortic atherosclerosis. Cardiac size at the upper limits of normal. No pericardial effusion. Mediastinum/Nodes: Negative noncontrast thoracic inlet and mediastinum. No lymphadenopathy. Small volume retained secretions in the thoracic esophagus which is nondilated. Lungs/Pleura: Major airways are patent. Respiratory motion artifact mostly in lower lobes. Upper lobe  paraseptal emphysema. Posterior lower lobe and costophrenic angle subpleural opacity which could reflect a combination of atelectasis and chronic pulmonary interstitial changes such as mild honeycombing. No pleural effusion. No consolidation. Musculoskeletal: Osteopenia. Mild chronic appearing T3 superior endplate compression. Chronic left posterior ninth rib fracture. No acute osseous abnormality identified. CT ABDOMEN PELVIS FINDINGS Hepatobiliary: Negative noncontrast liver and gallbladder. Pancreas: Negative. Spleen: Negative. Adrenals/Urinary Tract: Normal adrenal glands. Moderate to severe right and moderate left hydronephrosis. Associated bilateral extensive pararenal soft tissue stranding and mild expansion of the bilateral pararenal spaces. Thickening of the lateral Conal Foss show. Multiple punctate intrarenal calculi on the left. Questionable right midpole nephrolithiasis versus vascular calcification (coronal image 95). Associated bilateral hydroureter and periureteral stranding. The right ureter is abnormal to the level of a 5 x 8 mm calculus lodged just distal to the right ureteropelvic junction corresponding to the L3-L4 spinal level (series 3, image 87). Distal to this calculus there is persistent right periureteral stranding, but the right ureter is more decompressed. The right UVJ appears normal, however within the right posterior bladder there is an oval 3 x 6 mm calculus (series 3, image 121 and coronal image 98. The left ureter remains abnormal to the ureterovesical junction where an oval 5 x 7 mm calculus is demonstrated on coronal image 101. The urinary bladder is fairly decompressed. No perivesical stranding. Stomach/Bowel: Bulky stool ball in the rectum (sagittal image 102). Redundant sigmoid colon is mildly gas distended and tracks into the epigastrium. The descending colon is decompressed with some retained stool. Trace fluid in the left gutter appears probably related to the left renal  space. Negative splenic flexure and transverse colon. Mildly redundant hepatic flexure. Decompressed ascending colon. Trace fluid in the right gutter appears probably related to the right renal space. The right colon is redundant and the cecum is located on a lax mesentery in the right upper abdomen. Negative terminal ileum. Appendix is diminutive or absent. No dilated small bowel. Jejunum and ileum appear within normal limits. The duodenum appears mildly to moderately thickened and inflamed (coronal image 77), but this is felt to be secondary to the right renal findings. Negative stomach. No abdominal free air. Trace abdominal free fluid, primarily in both pericolic gutters. Vascular/Lymphatic: Aortoiliac calcified atherosclerosis. Vascular patency is not evaluated in the absence of IV contrast. Infrarenal abdominal aorta measures up to 25 mm diameter. No lymphadenopathy. Reproductive: Negative. Other: No pelvic free fluid. Musculoskeletal: Previously augmented L1 compression fracture. Osteopenia. Advanced L5-S1 disc degeneration. No acute osseous abnormality identified. IMPRESSION: 1. Moderate to severe bilateral Obstructive Uropathy. - 5 x 8 mm proximal Right ureter calculus about 2 cm distal to the right renal pelvis. - 5 x 7 mm distal Left ureter calculus just proximal to the UVJ. - a separate 6 mm calculus within the right urinary bladder probably is a recently passed stone. 2. Moderate to severe bilateral pararenal inflammatory changes. Consider Forniceal Rupture. 3. Secondary inflammatory involvement of the Duodenum, and small volume of free fluid  in both pericolic gutters also thought to be secondary to the renal findings. 4. Mild pulmonary atelectasis possibly superimposed on chronic lower lobe interstitial disease. Mild Emphysema (ICD10-J43.9). 5. Bulky stool ball in the rectum.  Consider fecal impaction. 6. Aortic Atherosclerosis (ICD10-I70.0). Ectatic abdominal aorta at risk for aneurysm development.  Recommend followup by ultrasound in 5 years. This recommendation follows ACR consensus guidelines: White Paper of the ACR Incidental Findings Committee II on Vascular Findings. J Am Coll Radiol 2013; 10:789-794. Electronically Signed   By: Odessa Fleming M.D.   On: 06/12/2017 11:40   Ct Head Wo Contrast  Result Date: 06/12/2017 CLINICAL DATA:  Altered mental status, sepsis EXAM: CT HEAD WITHOUT CONTRAST TECHNIQUE: Contiguous axial images were obtained from the base of the skull through the vertex without intravenous contrast. COMPARISON:  None. FINDINGS: Brain: No evidence of acute infarction, hemorrhage, extra-axial collection, ventriculomegaly, or mass effect. Right basal ganglia lacunar infarct. Generalized cerebral atrophy. Periventricular white matter low attenuation likely secondary to microangiopathy. Vascular: Cerebrovascular atherosclerotic calcifications are noted. Skull: Negative for fracture or focal lesion. Sinuses/Orbits: Visualized portions of the orbits are unremarkable. Visualized portions of the paranasal sinuses and mastoid air cells are unremarkable. Other: None. IMPRESSION: 1. No acute intracranial pathology. 2. Chronic microvascular disease and cerebral atrophy. Electronically Signed   By: Elige Ko   On: 06/12/2017 11:22   Ct Chest Wo Contrast  Result Date: 06/12/2017 CLINICAL DATA:  71 year old male admitted yesterday with sepsis and altered mental status. EXAM: CT CHEST, ABDOMEN AND PELVIS WITHOUT CONTRAST TECHNIQUE: Multidetector CT imaging of the chest, abdomen and pelvis was performed following the standard protocol without IV contrast. COMPARISON:  Portable chest 06/11/2017. FINDINGS: CT CHEST FINDINGS Cardiovascular: Vascular patency is not evaluated in the absence of IV contrast. Calcified coronary artery and descending thoracic aortic atherosclerosis. Cardiac size at the upper limits of normal. No pericardial effusion. Mediastinum/Nodes: Negative noncontrast thoracic inlet and  mediastinum. No lymphadenopathy. Small volume retained secretions in the thoracic esophagus which is nondilated. Lungs/Pleura: Major airways are patent. Respiratory motion artifact mostly in lower lobes. Upper lobe paraseptal emphysema. Posterior lower lobe and costophrenic angle subpleural opacity which could reflect a combination of atelectasis and chronic pulmonary interstitial changes such as mild honeycombing. No pleural effusion. No consolidation. Musculoskeletal: Osteopenia. Mild chronic appearing T3 superior endplate compression. Chronic left posterior ninth rib fracture. No acute osseous abnormality identified. CT ABDOMEN PELVIS FINDINGS Hepatobiliary: Negative noncontrast liver and gallbladder. Pancreas: Negative. Spleen: Negative. Adrenals/Urinary Tract: Normal adrenal glands. Moderate to severe right and moderate left hydronephrosis. Associated bilateral extensive pararenal soft tissue stranding and mild expansion of the bilateral pararenal spaces. Thickening of the lateral Conal Foss show. Multiple punctate intrarenal calculi on the left. Questionable right midpole nephrolithiasis versus vascular calcification (coronal image 95). Associated bilateral hydroureter and periureteral stranding. The right ureter is abnormal to the level of a 5 x 8 mm calculus lodged just distal to the right ureteropelvic junction corresponding to the L3-L4 spinal level (series 3, image 87). Distal to this calculus there is persistent right periureteral stranding, but the right ureter is more decompressed. The right UVJ appears normal, however within the right posterior bladder there is an oval 3 x 6 mm calculus (series 3, image 121 and coronal image 98. The left ureter remains abnormal to the ureterovesical junction where an oval 5 x 7 mm calculus is demonstrated on coronal image 101. The urinary bladder is fairly decompressed. No perivesical stranding. Stomach/Bowel: Bulky stool ball in the rectum (sagittal image 102).  Redundant sigmoid colon is mildly gas distended and tracks into the epigastrium. The descending colon is decompressed with some retained stool. Trace fluid in the left gutter appears probably related to the left renal space. Negative splenic flexure and transverse colon. Mildly redundant hepatic flexure. Decompressed ascending colon. Trace fluid in the right gutter appears probably related to the right renal space. The right colon is redundant and the cecum is located on a lax mesentery in the right upper abdomen. Negative terminal ileum. Appendix is diminutive or absent. No dilated small bowel. Jejunum and ileum appear within normal limits. The duodenum appears mildly to moderately thickened and inflamed (coronal image 77), but this is felt to be secondary to the right renal findings. Negative stomach. No abdominal free air. Trace abdominal free fluid, primarily in both pericolic gutters. Vascular/Lymphatic: Aortoiliac calcified atherosclerosis. Vascular patency is not evaluated in the absence of IV contrast. Infrarenal abdominal aorta measures up to 25 mm diameter. No lymphadenopathy. Reproductive: Negative. Other: No pelvic free fluid. Musculoskeletal: Previously augmented L1 compression fracture. Osteopenia. Advanced L5-S1 disc degeneration. No acute osseous abnormality identified. IMPRESSION: 1. Moderate to severe bilateral Obstructive Uropathy. - 5 x 8 mm proximal Right ureter calculus about 2 cm distal to the right renal pelvis. - 5 x 7 mm distal Left ureter calculus just proximal to the UVJ. - a separate 6 mm calculus within the right urinary bladder probably is a recently passed stone. 2. Moderate to severe bilateral pararenal inflammatory changes. Consider Forniceal Rupture. 3. Secondary inflammatory involvement of the Duodenum, and small volume of free fluid in both pericolic gutters also thought to be secondary to the renal findings. 4. Mild pulmonary atelectasis possibly superimposed on chronic lower  lobe interstitial disease. Mild Emphysema (ICD10-J43.9). 5. Bulky stool ball in the rectum.  Consider fecal impaction. 6. Aortic Atherosclerosis (ICD10-I70.0). Ectatic abdominal aorta at risk for aneurysm development. Recommend followup by ultrasound in 5 years. This recommendation follows ACR consensus guidelines: White Paper of the ACR Incidental Findings Committee II on Vascular Findings. J Am Coll Radiol 2013; 10:789-794. Electronically Signed   By: Odessa Fleming M.D.   On: 06/12/2017 11:40   Dg Chest Portable 1 View  Result Date: 06/11/2017 CLINICAL DATA:  Sepsis EXAM: PORTABLE CHEST 1 VIEW COMPARISON:  None. FINDINGS: Hypoventilation with bibasilar atelectasis. Negative for heart failure or pneumonia. Heart size within normal limits. No significant pleural effusion Advanced degenerative change left shoulder IMPRESSION: Mild bibasilar atelectasis.  Negative for pneumonia. Electronically Signed   By: Marlan Palau M.D.   On: 06/11/2017 11:07   Ir Nephrostomy Placement Left  Result Date: 06/12/2017 INDICATION: 71 year old with sepsis and bilateral obstructing ureter calculi. Patient needs urgent renal decompression. EXAM: PLACEMENT OF LEFT PERCUTANEOUS NEPHROSTOMY TUBE WITH ULTRASOUND AND FLUOROSCOPIC GUIDANCE PLACEMENT OF RIGHT PERCUTANEOUS NEPHROSTOMY TUBE WITH ULTRASOUND AND FLUOROSCOPIC GUIDANCE COMPARISON:  None. MEDICATIONS: Ciprofloxacin 400 mg; The antibiotic was administered in an appropriate time frame prior to skin puncture. ANESTHESIA/SEDATION: Fentanyl 25 mcg IV; Versed 1.0 mg IV Moderate Sedation Time:  30 minutes The patient was continuously monitored during the procedure by the interventional radiology nurse under my direct supervision. CONTRAST:  15 mL - administered into the collecting system(s) FLUOROSCOPY TIME:  Fluoroscopy Time: 2 minutes 18 seconds (8 mGy). COMPLICATIONS: None immediate. PROCEDURE: Informed written consent was obtained from the patient after a thorough discussion of the  procedural risks, benefits and alternatives. All questions were addressed. Maximal Sterile Barrier Technique was utilized including caps, mask, sterile gowns, sterile gloves, sterile drape, hand  hygiene and skin antiseptic. A timeout was performed prior to the initiation of the procedure. Patient was placed prone. Both flanks were prepped and draped in a sterile fashion. Left flank was anesthetized with 1% lidocaine. 22 gauge Chiba needle was dilated into a mid/ lower pole calyx with ultrasound guidance. Wire was easily advanced in the renal collecting system. Accustick dilator set was placed. Tract was dilated to accommodate a 10.2 [place] multipurpose drain. Blood tinged urine was collected and sent for culture. Small amount of contrast confirmed placement in the renal collecting system. Catheter was sutured to skin. Right flank was anesthetized with 1% lidocaine. Using ultrasound guidance, 22 gauge needle was directed into a mid/ lower pole calyx. Small amount of purulent fluid was draining from the needle. 0.018 wire was advanced to the renal pelvis. Accustick dilator set was placed and the tract was dilated to accommodate a 10.2 [place] multipurpose drain. Bloody fluid was aspirated and sent for culture. Small amount of contrast confirmed placement in the renal collecting system. Catheter was sutured to skin. Both tubes were connected to gravity bags. Fluoroscopic and ultrasound images were taken and saved for documentation. FINDINGS: Moderate left hydronephrosis. Left nephrostomy tube is well positioned in the left renal pelvis. Moderate-severe right hydronephrosis. Obstructing stone in the proximal right ureter. Nephrostomy tube is reconstituted in the right renal pelvis. Bloody fluid was removed from the right renal collecting system. IMPRESSION: Successful bilateral percutaneous nephrostomy tubes using ultrasound and fluoroscopic guidance. Bilateral nephrostomy tube samples were sent for culture.  Electronically Signed   By: Richarda Overlie M.D.   On: 06/12/2017 17:50   Ir Nephrostomy Placement Right  Result Date: 06/12/2017 INDICATION: 71 year old with sepsis and bilateral obstructing ureter calculi. Patient needs urgent renal decompression. EXAM: PLACEMENT OF LEFT PERCUTANEOUS NEPHROSTOMY TUBE WITH ULTRASOUND AND FLUOROSCOPIC GUIDANCE PLACEMENT OF RIGHT PERCUTANEOUS NEPHROSTOMY TUBE WITH ULTRASOUND AND FLUOROSCOPIC GUIDANCE COMPARISON:  None. MEDICATIONS: Ciprofloxacin 400 mg; The antibiotic was administered in an appropriate time frame prior to skin puncture. ANESTHESIA/SEDATION: Fentanyl 25 mcg IV; Versed 1.0 mg IV Moderate Sedation Time:  30 minutes The patient was continuously monitored during the procedure by the interventional radiology nurse under my direct supervision. CONTRAST:  15 mL - administered into the collecting system(s) FLUOROSCOPY TIME:  Fluoroscopy Time: 2 minutes 18 seconds (8 mGy). COMPLICATIONS: None immediate. PROCEDURE: Informed written consent was obtained from the patient after a thorough discussion of the procedural risks, benefits and alternatives. All questions were addressed. Maximal Sterile Barrier Technique was utilized including caps, mask, sterile gowns, sterile gloves, sterile drape, hand hygiene and skin antiseptic. A timeout was performed prior to the initiation of the procedure. Patient was placed prone. Both flanks were prepped and draped in a sterile fashion. Left flank was anesthetized with 1% lidocaine. 22 gauge Chiba needle was dilated into a mid/ lower pole calyx with ultrasound guidance. Wire was easily advanced in the renal collecting system. Accustick dilator set was placed. Tract was dilated to accommodate a 10.2 [place] multipurpose drain. Blood tinged urine was collected and sent for culture. Small amount of contrast confirmed placement in the renal collecting system. Catheter was sutured to skin. Right flank was anesthetized with 1% lidocaine. Using  ultrasound guidance, 22 gauge needle was directed into a mid/ lower pole calyx. Small amount of purulent fluid was draining from the needle. 0.018 wire was advanced to the renal pelvis. Accustick dilator set was placed and the tract was dilated to accommodate a 10.2 [place] multipurpose drain. Bloody fluid was aspirated and  sent for culture. Small amount of contrast confirmed placement in the renal collecting system. Catheter was sutured to skin. Both tubes were connected to gravity bags. Fluoroscopic and ultrasound images were taken and saved for documentation. FINDINGS: Moderate left hydronephrosis. Left nephrostomy tube is well positioned in the left renal pelvis. Moderate-severe right hydronephrosis. Obstructing stone in the proximal right ureter. Nephrostomy tube is reconstituted in the right renal pelvis. Bloody fluid was removed from the right renal collecting system. IMPRESSION: Successful bilateral percutaneous nephrostomy tubes using ultrasound and fluoroscopic guidance. Bilateral nephrostomy tube samples were sent for culture. Electronically Signed   By: Richarda OverlieAdam  Henn M.D.   On: 06/12/2017 17:50    Labs:  CBC: Recent Labs    10/31/16 0259 06/11/17 1036 06/11/17 1042 06/12/17 0539 06/13/17 0038  WBC 7.4  --  13.6* 14.0* 12.8*  HGB 10.3* 14.3 13.8 12.2* 12.4*  HCT 32.2* 42.0 41.6 37.9* 39.5  PLT 164  --  292 216 211    COAGS: Recent Labs    10/30/16 0851 06/11/17 1042  INR 1.03 1.08  APTT 28  --     BMP: Recent Labs    06/11/17 1905 06/12/17 0539 06/12/17 1450 06/13/17 0038  NA 144 144 144 146*  K 3.6 3.2* 3.5 3.6  CL 110 112* 114* 114*  CO2 22 21* 19* 21*  GLUCOSE 288* 334* 282* 308*  BUN 34* 43* 51* 44*  CALCIUM 8.2* 8.2* 7.9* 8.3*  CREATININE 1.32* 1.86* 2.64* 2.29*  GFRNONAA 53* 35* 23* 27*  GFRAA >60 41* 27* 32*    LIVER FUNCTION TESTS: Recent Labs    10/30/16 0851 06/11/17 1042  BILITOT 0.3 1.0  AST 19 27  ALT 19 20  ALKPHOS 76 69  PROT 6.9 8.7*    ALBUMIN 3.5 3.6    Assessment and Plan:  Sepsis B hydro B PCNs placed in IR 12/20 Lots better today Will follow Plan per Dr Vernie Ammonsttelin  Electronically Signed: Ralene MuskratURPIN,Lynzy Rawles A, PA-C 06/13/2017, 8:20 AM   I spent a total of 15 Minutes at the the patient's bedside AND on the patient's hospital floor or unit, greater than 50% of which was counseling/coordinating care for B PCNs

## 2017-06-13 NOTE — Progress Notes (Signed)
Notified MD of temp 103.2, orders received.

## 2017-06-13 NOTE — Evaluation (Signed)
Physical Therapy Evaluation Patient Details Name: Alan SeedsGlenn A Bilton Jr. MRN: [id number] DOB: [birth year] Today's Date: 06/13/2017   History of Present Illness  pt is a 71 y/o male with pmh significant for DM, CAD, dementia, CVA, PAD s/p L BKA and R transmet amputation and alcohol use d/o , presenting with sepsis.  Work up findings includ bacteremia due to ascending obstructive pyelonephritis, s/p nephrostomy tubes 12/20.  Clinical Impression  Pt admitted with/for sepsis.  Pt exhibiting neurological s/s that have not be corroborated on imaging.  Pt needing max to total assist for mobility which is significantly changed from PTA.Marland Kitchen.  Pt currently limited functionally due to the problems listed. ( See problems list.)   Pt will benefit from PT to maximize function and safety in order to get ready for next venue listed below.     Follow Up Recommendations SNF;Supervision/Assistance - 24 hour    Equipment Recommendations  None recommended by PT;Other (comment)(TBA next venue)    Recommendations for Other Services       Precautions / Restrictions Precautions Precautions: Fall      Mobility  Bed Mobility Overal bed mobility: Needs Assistance Bed Mobility: Supine to Sit;Sit to Sidelying;Rolling Rolling: Max assist   Supine to sit: Max assist;+2 for physical assistance   Sit to sidelying: Max assist;+2 for physical assistance General bed mobility comments: pt's wife demonstrated a safe understanding of mobilizing pt.  Due to new truncal/head list or drift to the right, pt's wife states much more difficult to assist as 1 person.  Transfers Overall transfer level: Needs assistance   Transfers: Sit to/from Stand Sit to Stand: Max assist(x2)         General transfer comment: stood to pull out cooling pad and straight bed pads.   pt did assist with R LE, but pt needed significant truncal suppoort.  Ambulation/Gait             General Gait Details: NA  Stairs             Wheelchair Mobility    Modified Rankin (Stroke Patients Only)       Balance Overall balance assessment: Needs assistance Sitting-balance support: Single extremity supported;Feet supported Sitting balance-Leahy Scale: Zero Sitting balance - Comments: pt with heavy truncal bias to the right.  pt also resistant to assist to correct to midline.       Standing balance-Leahy Scale: Zero                               Pertinent Vitals/Pain Pain Assessment: Faces Faces Pain Scale: Hurts little more Pain Location: L shoulder, neck, back Pain Descriptors / Indicators: Aching Pain Intervention(s): Monitored during session;Limited activity within patient's tolerance;Repositioned    Home Living Family/patient expects to be discharged to:: Unsure Living Arrangements: Spouse/significant other Available Help at Discharge: Family;Available 24 hours/day Type of Home: House Home Access: Ramped entrance     Home Layout: One level Home Equipment: Walker - 2 wheels;Bedside commode;Tub bench;Wheelchair - Careers advisermanual;Other (comment);Hospital bed      Prior Function Level of Independence: Needs assistance   Gait / Transfers Assistance Needed: Limited to transfers with assist of wife. Wife reports "on good days" pt able to perform slide board transfers with assist. On bad days; wife performs squat pivot transfer.  ADL's / Homemaking Assistance Needed: Wife assisting with all ADL; including self feeding at times.        Hand Dominance   Dominant Hand:  Right    Extremity/Trunk Assessment   Upper Extremity Assessment Upper Extremity Assessment: Defer to OT evaluation    Lower Extremity Assessment Lower Extremity Assessment: Generalized weakness       Communication   Communication: No difficulties  Cognition Arousal/Alertness: Awake/alert Behavior During Therapy: WFL for tasks assessed/performed;Flat affect Overall Cognitive Status: Impaired/Different from baseline Area  of Impairment: Orientation;Attention;Following commands;Problem solving;Awareness;Safety/judgement                 Orientation Level: Time;Situation;Place Current Attention Level: Sustained   Following Commands: Follows one step commands inconsistently Safety/Judgement: Decreased awareness of safety;Decreased awareness of deficits Awareness: Intellectual Problem Solving: Slow processing;Decreased initiation        General Comments General comments (skin integrity, edema, etc.): VSS throughout session.  temp 99.4    Exercises     Assessment/Plan    PT Assessment Patient needs continued PT services  PT Problem List Decreased strength;Decreased activity tolerance;Decreased balance;Decreased mobility;Decreased coordination       PT Treatment Interventions Functional mobility training;Balance training;Neuromuscular re-education;Patient/family education;Therapeutic activities    PT Goals (Current goals can be found in the Care Plan section)  Acute Rehab PT Goals Patient Stated Goal: back able to care for pt at home PT Goal Formulation: With patient/family Time For Goal Achievement: 06/27/17 Potential to Achieve Goals: Fair    Frequency Min 3X/week   Barriers to discharge        Co-evaluation PT/OT/SLP Co-Evaluation/Treatment: Yes Reason for Co-Treatment: Complexity of the patient's impairments (multi-system involvement) PT goals addressed during session: Mobility/safety with mobility         AM-PAC PT "6 Clicks" Daily Activity  Outcome Measure Difficulty turning over in bed (including adjusting bedclothes, sheets and blankets)?: Unable Difficulty moving from lying on back to sitting on the side of the bed? : Unable Difficulty sitting down on and standing up from a chair with arms (e.g., wheelchair, bedside commode, etc,.)?: Unable Help needed moving to and from a bed to chair (including a wheelchair)?: Total Help needed walking in hospital room?: Total Help  needed climbing 3-5 steps with a railing? : Total 6 Click Score: 6    End of Session   Activity Tolerance: Patient limited by fatigue Patient left: in bed;with call bell/phone within reach;with bed alarm set Nurse Communication: Mobility status PT Visit Diagnosis: Muscle weakness (generalized) (M62.81);Other symptoms and signs involving the nervous system (R29.898);Other abnormalities of gait and mobility (R26.89)    Time: 1500-1540 PT Time Calculation (min) (ACUTE ONLY): 40 min   Charges:   PT Evaluation $PT Eval High Complexity: 1 High PT Treatments $Therapeutic Activity: 8-22 mins   PT G Codes:        06/13/2017  Newberry BingKen Jayquan Bradsher, PT [phone]  (pager)  Eliseo GumKenneth V Andrew Soria 06/13/2017, 5:23 PM

## 2017-06-13 NOTE — Progress Notes (Signed)
Pharmacy Antibiotic Note  Alan SeedsGlenn A Krul Jr. is a 71 y.o. male admitted on 06/11/2017 with bacteremia and sepsis.  Pharmacy has been consulted for Vancomycin dosing.  Vancomycin level is elevated at 22 this AM. Draw before 3rd dose so true steady state level likely even higher. Scr was rising but has came down some this AM (1.86>>2.64>>2.29)  Plan: -Change vancomycin to 1250 mg IV q24h -Re-check vancomycin trough at steady state or with any significant clinical changes  Height: 6' (182.9 cm) Weight: 161 lb 2.5 oz (73.1 kg) IBW/kg (Calculated) : 77.6  Temp (24hrs), Avg:102.5 F (39.2 C), Min:101.3 F (38.5 C), Max:103.1 F (39.5 C)  Recent Labs  Lab 06/11/17 1036 06/11/17 1042 06/11/17 1147 06/11/17 1421 06/11/17 1905 06/12/17 0539 06/12/17 1135 06/12/17 1305 06/12/17 1450 06/13/17 0038  WBC  --  13.6*  --   --   --  14.0*  --   --   --  12.8*  CREATININE 1.40* 1.69*  --   --  1.32* 1.86*  --   --  2.64* 2.29*  LATICACIDVEN 3.51*  --  2.51* 1.64  --   --  1.2 1.2  --   --   VANCOTROUGH  --   --   --   --   --   --   --   --   --  22*    Estimated Creatinine Clearance: 31.5 mL/min (A) (by C-G formula based on SCr of 2.29 mg/dL (H)).    Allergies  Allergen Reactions  . Penicillins Rash    Has patient had a PCN reaction causing immediate rash, facial/tongue/throat swelling, SOB or lightheadedness with hypotension: Yes Has patient had a PCN reaction causing severe rash involving mucus membranes or skin necrosis: No Has patient had a PCN reaction that required hospitalization No Has patient had a PCN reaction occurring within the last 10 years: No If all of the above answers are "NO", then may proceed with Cephalosporin use.    Abran DukeLedford, Alan [name] 06/13/2017 2:15 AM

## 2017-06-13 NOTE — Progress Notes (Signed)
  Speech Language Pathology Treatment: Dysphagia  Patient Details Name: Alan SeedsGlenn A Knoch Jr. MRN: [id number] DOB: 1946/03/31 Today's Date: 06/13/2017 Time: [id number] SLP Time Calculation (min) (ACUTE ONLY): 20 min  Assessment / Plan / Recommendation Clinical Impression  Pt presents with significantly improved alertness and mentation in comparison to yesterday's evaluation.  Oral phase is atypical and prolonged with pt needing min verbal cues for oral holding.  Pt consistently initiated a swallow; however, response appeared delayed subjectively.  Pt had delayed coughing with thin liquids via teaspoon which wife reported to be typical; however, he had immediate, explosive, prolonged coughing with thin liquids via cup and subsequent desaturations into the high 70s-low 80s.  Wife reports that coughing noted with cup sips was worse from baseline.  No coughing or throat clearing was evident with purees.  Recommend MBS to determine safest consistencies for resuming PO intake.  Discussed with pt's wife and RN.  Pt left in bed with wife at bedside.    HPI HPI: 71 year old male admitted with decreased responsiveness, confusion, worsening baseline cough, high fever, difficulty swallowing, decreased po intake. Being treated for candiduria and UTI. CXR negative.  PMH of ETOH abuse, dementia, CVA.       SLP Plan  MBS       Recommendations  Diet recommendations: NPO Medication Administration: Via alternative means                Oral Care Recommendations: Oral care QID Follow up Recommendations: Other (comment)(TBD) SLP Visit Diagnosis: Dysphagia, oropharyngeal phase (R13.12) Plan: MBS       GO                Latanga Nedrow, Melanee SpryNicole L 06/13/2017, 10:43 AM

## 2017-06-13 NOTE — Progress Notes (Signed)
Subjective: She was lying in his bed today partially sitting up and responding to commands.  He was able to verbalize that he recognized myself, and his wife easily.  The patient stated that he was not in pain at this time.  Patient was unable to adequately answer and ROS but was able to state that he felt better and was glad to be able to feel.  Patient and his wife were informed of the plan to continue the current therapy with vancomycin, and fluids while draining the nephrostomy tubes and monitoring for improvement.  Objective:  Vital signs in last 24 hours: Vitals:   06/13/17 0402 06/13/17 0444 06/13/17 0456 06/13/17 0552  BP: (!) 177/90 (!) 164/93    Pulse:      Resp: (!) 34   (!) 25  Temp: (!) 103.2 F (39.6 C)  (!) 102.8 F (39.3 C) (!) 101 F (38.3 C)  TempSrc: Oral  Rectal Rectal  SpO2: 96%   96%  Weight:      Height:       ROS negative except as per HPI.  Physical Exam Constitutional: Patient is well-developed, well-nourished, no acute distress Cardiovascular: The patient's rate has returned to under 100 bpm, per minute rhythm appears regular at this time, and there is no murmur auscultated. Pulmonary: The patient's respiratory rate was approximately 21 the room, breath sounds were normal, he is in no acute respiratory distress Abdominal: Abdomen is soft, mildly tender, nondistended, Musculoskeletal: Distal extremity is nonedematous, nontender to palpation. BKA clean and clear Skin: Is warm without evidence of erythema or rash.  The area surrounding the nephrostomy insertion tubes is nonerythematous and nonedematous  Assessment/Plan:  Active Problems:   Sepsis (HCC)  Sepsis: Patient's HR and RR continue to remain elevated as does his temperature. Now that we potentially have source control it may take some time 24-36 hours to note improvement with continued vancomycin.  --Vanc trough above upper limit required for adequate treatment at 22, pharmacy appropriately  adjusted his dose down to 1250mg  daily. --Trough again ordered. --Patients renal function impaired but indicating mild improvement following the bilateral nephrostomy tubes. As per IR note, blood tinged cloudy fluid removed from left kidney, with bloody and mildly purulent fluid from right. This appears to be the source of his infection.  --Urology, as per note stated that they will treat the renal stones once the infection has cleared. --CBC this AM improved, WBC down to 12.8 with stable Hgb. --Repeat CBC in am --Continue current treatment  Prolonged QTc:  QTc still prolonged on EKG at 2134 hours on 12/20. But in sinus tach. --Hypokalemia currently resolved: 3.6, will replace with four runs of IV potassium --Mag level 1.1 initially, replaced with 4 grams of mag, now 3.1 --BMP in AM --EKG concerning as QTC is prolonged. Will discontinue fluconazole   Mild DKA, DMII: Glucose 308 this am. GFR worsened this morning to 35 with Cr to 1.86 this am. Potassium was 3.2 being replaced with IV potassium.  --NPO given failed swallow evaluation --CBG q4 with S-SSI, meal coverage and lantus 20U QHS --Call physician for blood glucose greater than 400, otherwise treat as per SSI --Repeat BMP in AM  AKI: Cr decreased slightly this am to 2.29 from a high of 2.64. This was recorded following the nephrostomy tube placement. Will decrease fluids to 13200ml/hr as alternate with lactated ringers due to increasing sodium --Recheck BMP for potassium and Cr in in early afternoon and AM  Diet: NPO, failed swallow  eval Code: DNR Fluids: 17125ml/hr normal saline until 0900 hours then at 13600ml/hr ringers Dvt PPX: heparin 5000U Dispo: Anticipated discharge in approximately 2-3 day(s).   Lanelle BalHarbrecht, Willard Farquharson, MD 06/13/2017, 6:36 AM Pager: Pager# [phone]

## 2017-06-13 NOTE — Progress Notes (Signed)
Stripped the tubing to the right nephrostomy tube, removed large blood clot, tube now draining light yellow.

## 2017-06-13 NOTE — Care Management Note (Addendum)
Case Management Note Donn PieriniKristi Akaila Rambo RN, BSN Unit 4E-Case Manager [phone]  Patient Details  Name: Alan [name] Jr. MRN: [id number] Date of Birth: 28-Jun-1945  Subjective/Objective:  Pt admitted with sepsis                  Action/Plan: PTA pt lived at home with wife- active with the [facility]- PT/OT evals pending- pt may need SNF placement (pt's family have been trying to place pt into SNF prior to admission) - CSW consulted-  [facility] contact- Carmon Sailsmily [phone]  Expected Discharge Date:                  Expected Discharge Plan:  Skilled Nursing Facility  In-House Referral:  Clinical Social Work  Discharge planning Services  CM Consult  Post Acute Care Choice:    Choice offered to:     DME Arranged:    DME Agency:     HH Arranged:    HH Agency:     Status of Service:  In process, will continue to follow  If discussed at Long Length of Stay Meetings, dates discussed:    Discharge Disposition:   Additional Comments:  Darrold SpanWebster, Tenae Graziosi Hall, RN 06/13/2017, 4:08 PM

## 2017-06-14 DIAGNOSIS — B377 Candidal sepsis: Secondary | ICD-10-CM

## 2017-06-14 DIAGNOSIS — E876 Hypokalemia: Secondary | ICD-10-CM

## 2017-06-14 DIAGNOSIS — A412 Sepsis due to unspecified staphylococcus: Secondary | ICD-10-CM

## 2017-06-14 DIAGNOSIS — Z87891 Personal history of nicotine dependence: Secondary | ICD-10-CM

## 2017-06-14 DIAGNOSIS — R7989 Other specified abnormal findings of blood chemistry: Secondary | ICD-10-CM

## 2017-06-14 DIAGNOSIS — Z89411 Acquired absence of right great toe: Secondary | ICD-10-CM

## 2017-06-14 DIAGNOSIS — B49 Unspecified mycosis: Secondary | ICD-10-CM

## 2017-06-14 DIAGNOSIS — N132 Hydronephrosis with renal and ureteral calculous obstruction: Secondary | ICD-10-CM

## 2017-06-14 DIAGNOSIS — M549 Dorsalgia, unspecified: Secondary | ICD-10-CM

## 2017-06-14 DIAGNOSIS — F0150 Vascular dementia without behavioral disturbance: Secondary | ICD-10-CM

## 2017-06-14 LAB — BLOOD CULTURE ID PANEL (REFLEXED)
ACINETOBACTER BAUMANNII: NOT DETECTED
CANDIDA ALBICANS: NOT DETECTED
CANDIDA GLABRATA: NOT DETECTED
CANDIDA PARAPSILOSIS: NOT DETECTED
Candida krusei: NOT DETECTED
Candida tropicalis: DETECTED — AB
ENTEROBACTER CLOACAE COMPLEX: NOT DETECTED
ENTEROBACTERIACEAE SPECIES: NOT DETECTED
ENTEROCOCCUS SPECIES: NOT DETECTED
ESCHERICHIA COLI: NOT DETECTED
Haemophilus influenzae: NOT DETECTED
Klebsiella oxytoca: NOT DETECTED
Klebsiella pneumoniae: NOT DETECTED
LISTERIA MONOCYTOGENES: NOT DETECTED
Neisseria meningitidis: NOT DETECTED
PSEUDOMONAS AERUGINOSA: NOT DETECTED
Proteus species: NOT DETECTED
SERRATIA MARCESCENS: NOT DETECTED
STREPTOCOCCUS PNEUMONIAE: NOT DETECTED
STREPTOCOCCUS PYOGENES: NOT DETECTED
Staphylococcus aureus (BCID): NOT DETECTED
Staphylococcus species: NOT DETECTED
Streptococcus agalactiae: NOT DETECTED
Streptococcus species: NOT DETECTED

## 2017-06-14 LAB — BASIC METABOLIC PANEL
ANION GAP: 7 (ref 5–15)
BUN: 36 mg/dL — AB (ref 6–20)
CHLORIDE: 114 mmol/L — AB (ref 101–111)
CO2: 25 mmol/L (ref 22–32)
Calcium: 8 mg/dL — ABNORMAL LOW (ref 8.9–10.3)
Creatinine, Ser: 1.14 mg/dL (ref 0.61–1.24)
GFR calc Af Amer: >60 mL/min (ref >60)
Glucose, Bld: 261 mg/dL — ABNORMAL HIGH (ref 65–99)
POTASSIUM: 3.4 mmol/L — AB (ref 3.5–5.1)
SODIUM: 146 mmol/L — AB (ref 135–145)

## 2017-06-14 LAB — URINE CULTURE

## 2017-06-14 LAB — GLUCOSE, CAPILLARY
GLUCOSE-CAPILLARY: 284 mg/dL — AB (ref 65–99)
GLUCOSE-CAPILLARY: 305 mg/dL — AB (ref 65–99)
Glucose-Capillary: 265 mg/dL — ABNORMAL HIGH (ref 65–99)
Glucose-Capillary: 286 mg/dL — ABNORMAL HIGH (ref 65–99)

## 2017-06-14 LAB — CBC
HCT: 40 % (ref 39.0–52.0)
HEMOGLOBIN: 12.8 g/dL — AB (ref 13.0–17.0)
MCH: 29.9 pg (ref 26.0–34.0)
MCHC: 32 g/dL (ref 30.0–36.0)
MCV: 93.5 fL (ref 78.0–100.0)
Platelets: 182 10*3/uL (ref 150–400)
RBC: 4.28 MIL/uL (ref 4.22–5.81)
RDW: 13.3 % (ref 11.5–15.5)
WBC: 10.6 10*3/uL — AB (ref 4.0–10.5)

## 2017-06-14 LAB — MAGNESIUM: Magnesium: 2.2 mg/dL (ref 1.7–2.4)

## 2017-06-14 MED ORDER — LACTATED RINGERS IV SOLN
INTRAVENOUS | Status: AC
  Administered 2017-06-14: 12:00:00 via INTRAVENOUS

## 2017-06-14 MED ORDER — FLUCONAZOLE IN SODIUM CHLORIDE 200-0.9 MG/100ML-% IV SOLN: 200.0000 mg | INTRAVENOUS | Status: DC

## 2017-06-14 MED ORDER — POTASSIUM CHLORIDE 10 MEQ/100ML IV SOLN
10.0000 meq | INTRAVENOUS | Status: AC
  Administered 2017-06-14 (×3): 10 meq via INTRAVENOUS
  Filled 2017-06-14 (×3): qty 100

## 2017-06-14 MED ORDER — SODIUM CHLORIDE 0.9 % IV SOLN
100.0000 mg | INTRAVENOUS | Status: DC
  Administered 2017-06-14 – 2017-06-22 (×9): 100 mg via INTRAVENOUS
  Filled 2017-06-14 (×10): qty 100

## 2017-06-14 MED ORDER — SODIUM CHLORIDE 0.9 % IV SOLN
200.0000 mg | Freq: Once | INTRAVENOUS | Status: AC
  Administered 2017-06-14: 200 mg via INTRAVENOUS
  Filled 2017-06-14: qty 200

## 2017-06-14 MED ORDER — FLUCONAZOLE IN SODIUM CHLORIDE 400-0.9 MG/200ML-% IV SOLN
400.0000 mg | Freq: Once | INTRAVENOUS | Status: AC
Start: 2017-06-14 — End: 2017-06-14
  Administered 2017-06-14: 400 mg via INTRAVENOUS
  Filled 2017-06-14: qty 200

## 2017-06-14 MED ORDER — VANCOMYCIN HCL IN DEXTROSE 1-5 GM/200ML-% IV SOLN
1000.0000 mg | Freq: Two times a day (BID) | INTRAVENOUS | Status: DC
  Administered 2017-06-14 – 2017-06-19 (×10): 1000 mg via INTRAVENOUS
  Filled 2017-06-14 (×12): qty 200

## 2017-06-14 NOTE — Progress Notes (Addendum)
Subjective: The patient was resting in his bed today but was concerned with back pain from the cooling system. He stated that his back pain was worse with the cooling system underneath and would very much like to have it removed. He was otherwise in much better spirits and able to speak more clearly than before. Denied pain or nausea. Desired to eat. Discussed the risks of aspiration with high risk of pneumonia while eating the risk he is willing to accept as he and his wife clearly believe this is his baseline function at home and has been without issue there.   Objective:  Vital signs in last 24 hours: Vitals:   06/14/17 0000 06/14/17 0400 06/14/17 0454 06/14/17 0500  BP: 119/78 (!) 164/90    Pulse:      Resp: 16 20 20    Temp: 99.1 F (37.3 C) 98.6 F (37 C)  99.9 F (37.7 C)  TempSrc: Rectal Oral  Rectal  SpO2: 97% 96% 98%   Weight:   163 lb 9.3 oz (74.2 kg)   Height:       ROS negative except as per HPI.  Physical Exam  Constitutional: He appears well-developed and well-nourished. No distress.  Eyes: EOM are normal.  Cardiovascular: Normal rate and regular rhythm.  Pulmonary/Chest: Effort normal. No respiratory distress.  Abdominal: Soft. Bowel sounds are normal. There is tenderness.  Bilateral nephrostomy tubes draining yellow fluid with mild blood tinge   Musculoskeletal: He exhibits no edema or tenderness.    Assessment/Plan:  Active Problems:   Sepsis (HCC)   Obstruction of kidney  Sepsis: Patient's HR and RR as well as temp have returned to normal limits. He has been on the cooling system overnight which has been active due to slightly elevated temp. This is likely due to the fungemia and should partially resolve, as such, we have discontinued the cooling system due to patients requests from severe back pain and skin deterioration as per wife. We will replace this is needed, risks/benefits explained.  --Vanc as per pharmacy, do believe they may need to dose adjust  with improving renal function at this time. Will likely need a 10 days course --ID has been consulted due to fungemia, will await their recommendations --Trough will need to be checked again due to greatly improved renal function in the patient.  --Source control appears to have been obtained. Will continue to treat bacteriemia and fungemia. --Urology, as per note stated that they will treat the renal stones once the infection has cleared. Will discuss disposition with their team today regarding moving forward with tube revision vs continued current treatment/tubes.  --CBC this AM improved, WBC down to 10.6 with stable Hgb. --Repeat CBC in am --Blood culture positive for candidal bacteriemia, fluconazole started at 800mg  doses as per pharmacy consult. EKG checked, no QTc prolongation noted, will monitor mag and K  Prolonged QTc:  RESOLVED as per most recent EKG --Hypokalemia currently 3.4, will replace with four runs of IV potassium --Mag level 1.1 initially, replaced with 4 grams of mag, 3.1 most recent.  --Repeat mag again today given hypokalemia --BMP in AM --EKG concerning for QTC is prolongation initially, discontinued fluconazole as per pharmacy request. Corrected QTc no reinitiating fluconazole for fungemia.  Mild DKA, DMII: Glucose 261 this am.  --CBG q4 with S-SSI, meal coverage and lantus 20U QHS --Call physician for blood glucose greater than 400, otherwise treat as per SSI --Repeat BMP in AM to continue to monitor  AKI: AKI resolved as  of this AM.  Will decrease fluids to 14100ml/hr as alternate with lactated ringers due to increasing sodium --Recheck BMP for potassium and Cr in in AM --Lactate ringers solution continued for 24 hours, please reassess and reorder as indicated. Na and Cl elevated, leading to discontinuation of normal saline  Diet: Placed on honey thick full liquid diet Code: DNR Fluids: 4675ml/hr ringers for 24 hours Dvt PPX: heparin 5000U Dispo: Anticipated  discharge in approximately 2-3 day(s).   Lanelle BalHarbrecht, Pratyush Ammon, MD 06/14/2017, 8:38 AM Pager: Pager# [phone]

## 2017-06-14 NOTE — Progress Notes (Signed)
Patient ID: Margaretha SeedsGlenn A Stang Jr., male   DOB: March 05, 1946, 71 y.o.   MRN: [id number]    Subjective: Pt clinically improving.  Bilateral nephrostomy tubes draining well.  Objective: Vital signs in last 24 hours: Temp:  [97.7 F (36.5 C)-99.9 F (37.7 C)] 98.1 F (36.7 C) (12/22 0855) Pulse Rate:  [92] 92 (12/21 1550) Resp:  [16-22] 19 (12/22 0855) BP: (98-164)/(75-90) 158/86 (12/22 0855) SpO2:  [96 %-100 %] 97 % (12/22 0855) Weight:  [74.2 kg (163 lb 9.3 oz)] 74.2 kg (163 lb 9.3 oz) (12/22 0454)  Intake/Output from previous day: 12/21 0701 - 12/22 0700 In: 2385 [P.O.:480; I.V.:730; IV Piggyback:450] Out: 1825 [Drains:1825] Intake/Output this shift: Total I/O In: -  Out: 450 [Urine:450]  Physical Exam:  General: Alert and oriented Abdomen: Soft, ND, B nephrostomy tubes draining clear urine.   Lab Results: Recent Labs    06/12/17 0539 06/13/17 0038 06/14/17 0224  HGB 12.2* 12.4* 12.8*  HCT 37.9* 39.5 40.0   CBC Latest Ref Rng & Units 06/14/2017 06/13/2017 06/12/2017  WBC 4.0 - 10.5 K/uL 10.6(H) 12.8(H) 14.0(H)  Hemoglobin 13.0 - 17.0 g/dL 12.8(L) 12.4(L) 12.2(L)  Hematocrit 39.0 - 52.0 % 40.0 39.5 37.9(L)  Platelets 150 - 400 K/uL 182 211 216    BMET Recent Labs    06/13/17 0038 06/14/17 0224  NA 146* 146*  K 3.6 3.4*  CL 114* 114*  CO2 21* 25  GLUCOSE 308* 261*  BUN 44* 36*  CREATININE 2.29* 1.14  CALCIUM 8.3* 8.0*     Studies/Results: Blood and urine cultures positive for yeast.  Now on IV fluconazole.  Assessment/Plan: 1) Bilateral ureteral stones with funguria and fungemia:  Bilateral ureteral obstruction now adequately treated and on appropriate IV antifungal therapy.  Will ultimately need culture specific therapy for 2 weeks prior to consideration of any intervention for stones.  His wife has significant concerns about patient pulling nephrostomy tubes out due to his dementia.  It would make sense that once patient is completely stabilized medically  to consider requesting IR to internalize nephrostomy tubes to internalized ureteral stents as patient will be at high risk for pulling out his nephrostomy tubes otherwise.  Will have our office schedule him for outpatient follow up with Dr. Vernie Ammonsttelin in a few weeks as an outpatient.   LOS: 3 days   Diago Haik,LES 06/14/2017, 9:26 AM

## 2017-06-14 NOTE — Progress Notes (Signed)
  Date: 06/14/2017  Patient name: Alan SeedsGlenn A Houpt Jr.  Medical record number: [id number]  Date of birth: 10-May-1946   I have seen and evaluated this patient and I have discussed the plan of care with the house staff. Please see Dr. Godfrey PickHarbrecht's note for complete details. I concur with his findings with the following additions/corrections:   [facility], not [facility].  Thought to be staph saphrophyticus in 1 bottle, other bottle with staph species.  2/2 bottles positive.  No sensitivities yet UC 12/20: Yeast BC X 2 12/21: 2/2 + for yeast, rapid id is candida tropicalis   MR staph species in blood:  Have been treating with vancomycin 12/19 --> Likely a 10 day course  Candidemia:  Fluconazole X 1 dose (200mg ) on 12/19 Fluconazole 400mg  IV X 1 dose on 12/22 after BC returned  Other Abx:  Cefepime 12/19 X 1 dose Ciprofloxacin 12/20 X 1 dose  ID has been consulted given multiple positive cultures.  Awaiting their recommendations for candidemia and duration.  Usually 2 weeks, unless another nidus of infection is found and then would need longer duration.  Candida tropicalis is usually susceptible to the azoles, from what I understand, but this patient would benefit from further expertise.   He has bilateral nephrostomy tubes in place as source control.    [name], Rhone Ozaki B, MD 06/14/2017, 10:38 AM

## 2017-06-14 NOTE — Progress Notes (Signed)
PHARMACY - PHYSICIAN COMMUNICATION CRITICAL VALUE ALERT - BLOOD CULTURE IDENTIFICATION (BCID)  Margaretha SeedsGlenn A Litaker Jr. is an 71 y.o. 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  Blood cultures 12/19 grew MR-CoNS, and subsequent blood cultures 12/21 are now growing yeast.    Name of physician (or Provider) Contacted:  Dr. Nelson ChimesAmin  Current antibiotics: Vancomycin   Changes to prescribed antibiotics recommended:  Add Fluconazole 400 mg IV x 1, then 200 mg daily  Results for orders placed or performed during the hospital encounter of 06/11/17  Blood Culture ID Panel (Reflexed) (Collected: 06/13/2017 12:38 AM)  Result Value Ref Range   Enterococcus species NOT DETECTED NOT DETECTED   Listeria monocytogenes NOT DETECTED NOT DETECTED   Staphylococcus species NOT DETECTED NOT DETECTED   Staphylococcus aureus NOT DETECTED NOT DETECTED   Streptococcus species NOT DETECTED NOT DETECTED   Streptococcus agalactiae NOT DETECTED NOT DETECTED   Streptococcus pneumoniae NOT DETECTED NOT DETECTED   Streptococcus pyogenes NOT DETECTED NOT DETECTED   Acinetobacter baumannii NOT DETECTED NOT DETECTED   Enterobacteriaceae species NOT DETECTED NOT DETECTED   Enterobacter cloacae complex NOT DETECTED NOT DETECTED   Escherichia coli NOT DETECTED NOT DETECTED   Klebsiella oxytoca NOT DETECTED NOT DETECTED   Klebsiella pneumoniae NOT DETECTED NOT DETECTED   Proteus species NOT DETECTED NOT DETECTED   Serratia marcescens NOT DETECTED NOT DETECTED   Haemophilus influenzae NOT DETECTED NOT DETECTED   Neisseria meningitidis NOT DETECTED NOT DETECTED   Pseudomonas aeruginosa NOT DETECTED NOT DETECTED   Candida albicans NOT DETECTED NOT DETECTED   Candida glabrata NOT DETECTED NOT DETECTED   Candida krusei NOT DETECTED NOT DETECTED   Candida parapsilosis NOT DETECTED NOT DETECTED   Candida  tropicalis DETECTED (A) NOT DETECTED    Eddie Candlebbott, Christmas Faraci Vernon 06/14/2017  12:43 AM

## 2017-06-14 NOTE — Progress Notes (Signed)
Pharmacy Antibiotic Note  Alan SeedsGlenn A Bohman Jr. is a 71 y.o. male with CONS and candida tropicalis bacteremia and sepsis.  Pharmacy has been consulted for Fluconazole (day #1) and vancomycin dosing (day #4). Afebrile, WBC down to 10.6  AKI on admit, now SCr significantly improved. Vancomycin dose was adjusted down for trough of 22 (prior to 3rd dose, not at steady state) on 12/21, and patient has only received 1 dose of new regimen. Given significant improvement in renal function and bacteremia, will empirically increase dose and check vancomycin trough at steady state.   Plan: -Empirically increase vancomycin to 1g IV q12h due to improvement in renal function -Continue fluconazole 200 mg IV q24h -Monitor clinical progress, c/s, renal function, vancomycin trough at steady state -F/u ID recommendations  Height: 6' (182.9 cm) Weight: 163 lb 9.3 oz (74.2 kg) IBW/kg (Calculated) : 77.6  Temp (24hrs), Avg:98.8 F (37.1 C), Min:97.7 F (36.5 C), Max:99.9 F (37.7 C)  Recent Labs  Lab 06/11/17 1036 06/11/17 1042 06/11/17 1147 06/11/17 1421 06/11/17 1905 06/12/17 0539 06/12/17 1135 06/12/17 1305 06/12/17 1450 06/13/17 0038 06/14/17 0224  WBC  --  13.6*  --   --   --  14.0*  --   --   --  12.8* 10.6*  CREATININE 1.40* 1.69*  --   --  1.32* 1.86*  --   --  2.64* 2.29* 1.14  LATICACIDVEN 3.51*  --  2.51* 1.64  --   --  1.2 1.2  --   --   --   VANCOTROUGH  --   --   --   --   --   --   --   --   --  22*  --     Estimated Creatinine Clearance: 64.2 mL/min (by C-G formula based on SCr of 1.14 mg/dL).    Allergies  Allergen Reactions  . Penicillins Rash    Has patient had a PCN reaction causing immediate rash, facial/tongue/throat swelling, SOB or lightheadedness with hypotension: Yes Has patient had a PCN reaction causing severe rash involving mucus membranes or skin necrosis: No Has patient had a PCN reaction that required hospitalization No Has patient had a PCN reaction occurring  within the last 10 years: No If all of the above answers are "NO", then may proceed with Cephalosporin use.     Alan [name], PharmD, BCPS Clinical Pharmacist Clinical phone for 06/14/2017 until 3:30pm: [phone] If after 3:30pm, please call main pharmacy at: [phone] 06/14/2017 12:52 PM

## 2017-06-14 NOTE — Consult Note (Signed)
[facility]    Date of Admission:  06/11/2017   Total days of antibiotics: 3 vanco,        fluconcazole               Reason for Consult: Fungemia    Referring Provider: Wynona Neat   Assessment: MRSE bacteremia (12-19) BCx+ Yeast (C tropicalis) (12-21) Hydronephrosis, s/p bilateral nephrostomy tubes.  ARF  Plan: 1. Continue vanco  2. change to eraxis with persistent fevers 3. Repeat BCx 4. Would defer TEE, due to comorbidities 5. Check TTE 6. Repeat BCx in AM 7. Cr now normalized  Comment- If fever persists would recheck imaging of kidneys.  C tropicalis can be R-flucon. Perhaps that is the source of continued fever.  I would not favor prolonged anbx IV  (does not need 6 weeks) with clear source of bacteremia. Unless he has persistent bacteremia.  Flucon sensi can be sent, the result will take ~ 2 weeks to come back.....   Thank you so much for this interesting consult,  Active Problems:   Sepsis ([place])   Obstruction of kidney   . heparin  5,000 Units Subcutaneous Q8H  . insulin aspart  0-9 Units Subcutaneous TID WC  . insulin glargine  30 Units Subcutaneous QHS  . lidocaine  1 patch Transdermal Q24H    HPI: Marton Malizia. is a 71 y.o. male with hx of vascular dementia, DM2, brought to ED on 12-20 with decreased level of interaction/function. He came to ED on 12-20 when he was non-verbal. He had temp 102.6. He was found on Ct to have bilateral obstructive hydronephrosis with stones. His BCx were positive for MRSE. Over the first 24h his WBC increased slightly, he remained febrile.He had IR placed bilateral nephrostomy tubes on 12-20.  His BCx are also now found to have yeast.     Review of Systems: Review of Systems  Unable to perform ROS: Mental acuity    Past Medical History:  Diagnosis Date  . Anemia   . Arthritis   . Dementia   . Diabetes mellitus without complication ([place])   . ETOH abuse   . Myocardial  infarction ([place])   . Peripheral arterial disease ([place])   . Stroke ([place])   . Wears dentures   . Wears glasses     Social History   Tobacco Use  . Smoking status: Former Smoker    Types: Cigarettes  . Smokeless tobacco: Never Used  . Tobacco comment: Quit smoking cigarettes 10/ 2017  Substance Use Topics  . Alcohol use: No    Comment: former  . Drug use: No    Family History  Problem Relation Age of Onset  . Diabetes Mother   . Heart disease Father   . Heart disease Brother      Medications:  Continuous: . [START ON 06/15/2017] fluconazole (DIFLUCAN) IV    . lactated ringers 75 mL/hr at 06/14/17 1226  . vancomycin 1,000 mg (06/14/17 1413)    Abtx:  Anti-infectives (From admission, onward)   Start     Dose/Rate Route Frequency Ordered Stop   06/15/17 1000  fluconazole (DIFLUCAN) IVPB 200 mg     200 mg 100 mL/hr over 60 Minutes Intravenous Every 24 hours 06/14/17 0131     06/14/17 1300  vancomycin (VANCOCIN) IVPB 1000 mg/200 mL premix     1,000 mg 200 mL/hr over 60 Minutes Intravenous Every 12 hours 06/14/17 1254  06/14/17 0145  fluconazole (DIFLUCAN) IVPB 400 mg     400 mg 100 mL/hr over 120 Minutes Intravenous  Once 06/14/17 0131 06/14/17 0547   06/13/17 1600  vancomycin (VANCOCIN) 1,250 mg in sodium chloride 0.9 % 250 mL IVPB  Status:  Discontinued     1,250 mg 166.7 mL/hr over 90 Minutes Intravenous Every 24 hours 06/13/17 0222 06/14/17 1254   06/12/17 1630  ciprofloxacin (CIPRO) IVPB 400 mg     400 mg 200 mL/hr over 60 Minutes Intravenous  Once 06/12/17 1554 06/12/17 1708   06/12/17 0100  vancomycin (VANCOCIN) IVPB 750 mg/150 ml premix  Status:  Discontinued     750 mg 150 mL/hr over 60 Minutes Intravenous Every 12 hours 06/11/17 1143 06/13/17 0216   06/11/17 2000  fluconazole (DIFLUCAN) IVPB 200 mg  Status:  Discontinued     200 mg 100 mL/hr over 60 Minutes Intravenous Every 24 hours 06/11/17 1826 06/12/17 1301   06/11/17 1800  piperacillin-tazobactam  (ZOSYN) IVPB 3.375 g  Status:  Discontinued     3.375 g 12.5 mL/hr over 240 Minutes Intravenous Every 8 hours 06/11/17 1143 06/11/17 1731   06/11/17 1745  ceFEPIme (MAXIPIME) 2 g in dextrose 5 % 50 mL IVPB  Status:  Discontinued     2 g 100 mL/hr over 30 Minutes Intravenous Every 24 hours 06/11/17 1738 06/12/17 1055   06/11/17 1200  piperacillin-tazobactam (ZOSYN) IVPB 3.375 g     3.375 g 100 mL/hr over 30 Minutes Intravenous  Once 06/11/17 1140 06/11/17 1230   06/11/17 1145  vancomycin (VANCOCIN) 1,500 mg in sodium chloride 0.9 % 500 mL IVPB     1,500 mg 250 mL/hr over 120 Minutes Intravenous  Once 06/11/17 1140 06/11/17 1408        OBJECTIVE: Blood pressure (!) 167/85, pulse 92, temperature 98.6 F (37 C), temperature source Axillary, resp. rate (!) 21, height 6' (1.829 m), weight 74.2 kg (163 lb 9.3 oz), SpO2 97 %.  Physical Exam  Constitutional: He is well-developed, well-nourished, and in no distress.  HENT:  Mouth/Throat: No oropharyngeal exudate.  Eyes: EOM are normal. Pupils are equal, round, and reactive to light.  Neck: Neck supple.  Cardiovascular: Normal rate, regular rhythm and normal heart sounds.  Pulmonary/Chest: Effort normal and breath sounds normal.  Abdominal: Soft. Bowel sounds are normal. There is tenderness in the epigastric area. There is no rigidity and no guarding.  Musculoskeletal: He exhibits no edema.       Legs:      Feet:  Lymphadenopathy:    He has no cervical adenopathy.    Lab Results Results for orders placed or performed during the hospital encounter of 06/11/17 (from the past 48 hour(s))  Basic metabolic panel     Status: Abnormal   Collection Time: 06/12/17  2:50 PM  Result Value Ref Range   Sodium 144 135 - 145 mmol/L   Potassium 3.5 3.5 - 5.1 mmol/L   Chloride 114 (H) 101 - 111 mmol/L   CO2 19 (L) 22 - 32 mmol/L   Glucose, Bld 282 (H) 65 - 99 mg/dL   BUN 51 (H) 6 - 20 mg/dL   Creatinine, Ser 2.64 (H) 0.61 - 1.24 mg/dL   Calcium  7.9 (L) 8.9 - 10.3 mg/dL   GFR calc non Af Amer 23 (L) >60 mL/min   GFR calc Af Amer 27 (L) >60 mL/min    Comment: (NOTE) The eGFR has been calculated using the CKD EPI equation. This calculation has  not been validated in all clinical situations. eGFR's persistently <60 mL/min signify possible Chronic Kidney Disease.    Anion gap 11 5 - 15  Urine Culture     Status: Abnormal   Collection Time: 06/12/17  5:00 PM  Result Value Ref Range   Specimen Description URINE, CATHETERIZED    Special Requests RIGHT NEPHROSTOMY TUBE    Culture >=100,000 COLONIES/mL YEAST (A)    Report Status 06/14/2017 FINAL   Urine Culture     Status: Abnormal   Collection Time: 06/12/17  5:04 PM  Result Value Ref Range   Specimen Description URINE, CATHETERIZED    Special Requests LEFT NEPHROSTOMY TUBE    Culture >=100,000 COLONIES/mL YEAST (A)    Report Status 06/14/2017 FINAL   Glucose, capillary     Status: Abnormal   Collection Time: 06/12/17  7:04 PM  Result Value Ref Range   Glucose-Capillary 293 (H) 65 - 99 mg/dL  Glucose, capillary     Status: Abnormal   Collection Time: 06/12/17  8:43 PM  Result Value Ref Range   Glucose-Capillary 303 (H) 65 - 99 mg/dL  Vancomycin, trough     Status: Abnormal   Collection Time: 06/13/17 12:38 AM  Result Value Ref Range   Vancomycin Tr 22 (HH) 15 - 20 ug/mL    Comment: CRITICAL RESULT CALLED TO, READ BACK BY AND VERIFIED WITH: [facility] M,RN 06/13/17 0153 WAYK   Basic metabolic panel     Status: Abnormal   Collection Time: 06/13/17 12:38 AM  Result Value Ref Range   Sodium 146 (H) 135 - 145 mmol/L   Potassium 3.6 3.5 - 5.1 mmol/L   Chloride 114 (H) 101 - 111 mmol/L   CO2 21 (L) 22 - 32 mmol/L   Glucose, Bld 308 (H) 65 - 99 mg/dL   BUN 44 (H) 6 - 20 mg/dL   Creatinine, Ser 2.29 (H) 0.61 - 1.24 mg/dL   Calcium 8.3 (L) 8.9 - 10.3 mg/dL   GFR calc non Af Amer 27 (L) >60 mL/min   GFR calc Af Amer 32 (L) >60 mL/min    Comment: (NOTE) The eGFR has been  calculated using the CKD EPI equation. This calculation has not been validated in all clinical situations. eGFR's persistently <60 mL/min signify possible Chronic Kidney Disease.    Anion gap 11 5 - 15  Magnesium     Status: Abnormal   Collection Time: 06/13/17 12:38 AM  Result Value Ref Range   Magnesium 3.1 (H) 1.7 - 2.4 mg/dL  CBC with Differential/Platelet     Status: Abnormal   Collection Time: 06/13/17 12:38 AM  Result Value Ref Range   WBC 12.8 (H) 4.0 - 10.5 K/uL   RBC 4.20 (L) 4.22 - 5.81 MIL/uL   Hemoglobin 12.4 (L) 13.0 - 17.0 g/dL   HCT 39.5 39.0 - 52.0 %   MCV 94.0 78.0 - 100.0 fL   MCH 29.5 26.0 - 34.0 pg   MCHC 31.4 30.0 - 36.0 g/dL   RDW 13.4 11.5 - 15.5 %   Platelets 211 150 - 400 K/uL   Neutrophils Relative % 74 %   Neutro Abs 9.5 (H) 1.7 - 7.7 K/uL   Lymphocytes Relative 16 %   Lymphs Abs 2.0 0.7 - 4.0 K/uL   Monocytes Relative 10 %   Monocytes Absolute 1.3 (H) 0.1 - 1.0 K/uL   Eosinophils Relative 0 %   Eosinophils Absolute 0.0 0.0 - 0.7 K/uL   Basophils Relative 0 %   Basophils  Absolute 0.0 0.0 - 0.1 K/uL  Culture, blood (routine x 2)     Status: Abnormal (Preliminary result)   Collection Time: 06/13/17 12:38 AM  Result Value Ref Range   Specimen Description BLOOD RIGHT HAND    Special Requests IN PEDIATRIC BOTTLE Blood Culture adequate volume    Culture  Setup Time      YEAST IN PEDIATRIC BOTTLE CRITICAL VALUE NOTED.  VALUE IS CONSISTENT WITH PREVIOUSLY REPORTED AND CALLED VALUE.    Culture YEAST CULTURE REINCUBATED FOR BETTER GROWTH  (A)    Report Status PENDING   Culture, blood (routine x 2)     Status: Abnormal (Preliminary result)   Collection Time: 06/13/17 12:38 AM  Result Value Ref Range   Specimen Description BLOOD RIGHT HAND    Special Requests IN PEDIATRIC BOTTLE Blood Culture adequate volume    Culture  Setup Time (A)     YEAST IN PEDIATRIC BOTTLE CRITICAL RESULT CALLED TO, READ BACK BY AND VERIFIED WITH: G ABBOTT PHARMD [id number]  06/14/17 A BROWNING    Culture YEAST CULTURE REINCUBATED FOR BETTER GROWTH     Report Status PENDING   Blood Culture ID Panel (Reflexed)     Status: Abnormal   Collection Time: 06/13/17 12:38 AM  Result Value Ref Range   Enterococcus species NOT DETECTED NOT DETECTED   Listeria monocytogenes NOT DETECTED NOT DETECTED   Staphylococcus species NOT DETECTED NOT DETECTED   Staphylococcus aureus NOT DETECTED NOT DETECTED   Streptococcus species NOT DETECTED NOT DETECTED   Streptococcus agalactiae NOT DETECTED NOT DETECTED   Streptococcus pneumoniae NOT DETECTED NOT DETECTED   Streptococcus pyogenes NOT DETECTED NOT DETECTED   Acinetobacter baumannii NOT DETECTED NOT DETECTED   Enterobacteriaceae species NOT DETECTED NOT DETECTED   Enterobacter cloacae complex NOT DETECTED NOT DETECTED   Escherichia coli NOT DETECTED NOT DETECTED   Klebsiella oxytoca NOT DETECTED NOT DETECTED   Klebsiella pneumoniae NOT DETECTED NOT DETECTED   Proteus species NOT DETECTED NOT DETECTED   Serratia marcescens NOT DETECTED NOT DETECTED   Haemophilus influenzae NOT DETECTED NOT DETECTED   Neisseria meningitidis NOT DETECTED NOT DETECTED   Pseudomonas aeruginosa NOT DETECTED NOT DETECTED   Candida albicans NOT DETECTED NOT DETECTED   Candida glabrata NOT DETECTED NOT DETECTED   Candida krusei NOT DETECTED NOT DETECTED   Candida parapsilosis NOT DETECTED NOT DETECTED   Candida tropicalis DETECTED (A) NOT DETECTED    Comment: CRITICAL RESULT CALLED TO, READ BACK BY AND VERIFIED WITH: G ABBOTT PHARMD 0038 06/14/17 A BROWNING   Glucose, capillary     Status: Abnormal   Collection Time: 06/13/17  6:05 AM  Result Value Ref Range   Glucose-Capillary 314 (H) 65 - 99 mg/dL  Glucose, capillary     Status: Abnormal   Collection Time: 06/13/17 10:59 AM  Result Value Ref Range   Glucose-Capillary 288 (H) 65 - 99 mg/dL   Comment 1 Notify RN   Glucose, capillary     Status: Abnormal   Collection Time: 06/13/17   4:39 PM  Result Value Ref Range   Glucose-Capillary 320 (H) 65 - 99 mg/dL   Comment 1 Notify RN   Glucose, capillary     Status: Abnormal   Collection Time: 06/13/17  9:19 PM  Result Value Ref Range   Glucose-Capillary 255 (H) 65 - 99 mg/dL  CBC     Status: Abnormal   Collection Time: 06/14/17  2:24 AM  Result Value Ref Range   WBC 10.6 (H) 4.0 -  10.5 K/uL   RBC 4.28 4.22 - 5.81 MIL/uL   Hemoglobin 12.8 (L) 13.0 - 17.0 g/dL   HCT 40.0 39.0 - 52.0 %   MCV 93.5 78.0 - 100.0 fL   MCH 29.9 26.0 - 34.0 pg   MCHC 32.0 30.0 - 36.0 g/dL   RDW 13.3 11.5 - 15.5 %   Platelets 182 150 - 400 K/uL  Basic metabolic panel     Status: Abnormal   Collection Time: 06/14/17  2:24 AM  Result Value Ref Range   Sodium 146 (H) 135 - 145 mmol/L   Potassium 3.4 (L) 3.5 - 5.1 mmol/L   Chloride 114 (H) 101 - 111 mmol/L   CO2 25 22 - 32 mmol/L   Glucose, Bld 261 (H) 65 - 99 mg/dL   BUN 36 (H) 6 - 20 mg/dL   Creatinine, Ser 1.14 0.61 - 1.24 mg/dL    Comment: DELTA CHECK NOTED   Calcium 8.0 (L) 8.9 - 10.3 mg/dL   GFR calc non Af Amer >60 >60 mL/min   GFR calc Af Amer >60 >60 mL/min    Comment: (NOTE) The eGFR has been calculated using the CKD EPI equation. This calculation has not been validated in all clinical situations. eGFR's persistently <60 mL/min signify possible Chronic Kidney Disease.    Anion gap 7 5 - 15  Magnesium     Status: None   Collection Time: 06/14/17  2:24 AM  Result Value Ref Range   Magnesium 2.2 1.7 - 2.4 mg/dL  Glucose, capillary     Status: Abnormal   Collection Time: 06/14/17  6:38 AM  Result Value Ref Range   Glucose-Capillary 265 (H) 65 - 99 mg/dL  Glucose, capillary     Status: Abnormal   Collection Time: 06/14/17 11:06 AM  Result Value Ref Range   Glucose-Capillary 284 (H) 65 - 99 mg/dL   Comment 1 Notify RN       Component Value Date/Time   SDES BLOOD RIGHT HAND 06/13/2017 0038   SDES BLOOD RIGHT HAND 06/13/2017 0038   SPECREQUEST IN PEDIATRIC BOTTLE Blood  Culture adequate volume 06/13/2017 0038   SPECREQUEST IN PEDIATRIC BOTTLE Blood Culture adequate volume 06/13/2017 0038   CULT YEAST CULTURE REINCUBATED FOR BETTER GROWTH  (A) 06/13/2017 0038   CULT YEAST CULTURE REINCUBATED FOR BETTER GROWTH  06/13/2017 0038   REPTSTATUS PENDING 06/13/2017 0038   REPTSTATUS PENDING 06/13/2017 0038   Ir Nephrostomy Placement Left  Result Date: 06/12/2017 INDICATION: 71 year old with sepsis and bilateral obstructing ureter calculi. Patient needs urgent renal decompression. EXAM: PLACEMENT OF LEFT PERCUTANEOUS NEPHROSTOMY TUBE WITH ULTRASOUND AND FLUOROSCOPIC GUIDANCE PLACEMENT OF RIGHT PERCUTANEOUS NEPHROSTOMY TUBE WITH ULTRASOUND AND FLUOROSCOPIC GUIDANCE COMPARISON:  None. MEDICATIONS: Ciprofloxacin 400 mg; The antibiotic was administered in an appropriate time frame prior to skin puncture. ANESTHESIA/SEDATION: Fentanyl 25 mcg IV; Versed 1.0 mg IV Moderate Sedation Time:  30 minutes The patient was continuously monitored during the procedure by the interventional radiology nurse under my direct supervision. CONTRAST:  15 mL - administered into the collecting system(s) FLUOROSCOPY TIME:  Fluoroscopy Time: 2 minutes 18 seconds (8 mGy). COMPLICATIONS: None immediate. PROCEDURE: Informed written consent was obtained from the patient after a thorough discussion of the procedural risks, benefits and alternatives. All questions were addressed. Maximal Sterile Barrier Technique was utilized including caps, mask, sterile gowns, sterile gloves, sterile drape, hand hygiene and skin antiseptic. A timeout was performed prior to the initiation of the procedure. Patient was placed prone. Both flanks were prepped  and draped in a sterile fashion. Left flank was anesthetized with 1% lidocaine. [place] needle was dilated into a mid/ lower pole calyx with ultrasound guidance. Wire was easily advanced in the renal collecting system. Accustick dilator set was placed. Tract was  dilated to accommodate a 10.2 [place] multipurpose drain. Blood tinged urine was collected and sent for culture. Small amount of contrast confirmed placement in the renal collecting system. Catheter was sutured to skin. Right flank was anesthetized with 1% lidocaine. Using ultrasound guidance, 22 gauge needle was directed into a mid/ lower pole calyx. Small amount of purulent fluid was draining from the needle. 0.018 wire was advanced to the renal pelvis. Accustick dilator set was placed and the tract was dilated to accommodate a 10.2 [place] multipurpose drain. Bloody fluid was aspirated and sent for culture. Small amount of contrast confirmed placement in the renal collecting system. Catheter was sutured to skin. Both tubes were connected to gravity bags. Fluoroscopic and ultrasound images were taken and saved for documentation. FINDINGS: Moderate left hydronephrosis. Left nephrostomy tube is well positioned in the left renal pelvis. Moderate-severe right hydronephrosis. Obstructing stone in the proximal right ureter. Nephrostomy tube is reconstituted in the right renal pelvis. Bloody fluid was removed from the right renal collecting system. IMPRESSION: Successful bilateral percutaneous nephrostomy tubes using ultrasound and fluoroscopic guidance. Bilateral nephrostomy tube samples were sent for culture. Electronically Signed   By: Markus Daft M.D.   On: 06/12/2017 17:50   Ir Nephrostomy Placement Right  Result Date: 06/12/2017 INDICATION: 71 year old with sepsis and bilateral obstructing ureter calculi. Patient needs urgent renal decompression. EXAM: PLACEMENT OF LEFT PERCUTANEOUS NEPHROSTOMY TUBE WITH ULTRASOUND AND FLUOROSCOPIC GUIDANCE PLACEMENT OF RIGHT PERCUTANEOUS NEPHROSTOMY TUBE WITH ULTRASOUND AND FLUOROSCOPIC GUIDANCE COMPARISON:  None. MEDICATIONS: Ciprofloxacin 400 mg; The antibiotic was administered in an appropriate time frame prior to skin puncture. ANESTHESIA/SEDATION: Fentanyl 25 mcg IV; Versed  1.0 mg IV Moderate Sedation Time:  30 minutes The patient was continuously monitored during the procedure by the interventional radiology nurse under my direct supervision. CONTRAST:  15 mL - administered into the collecting system(s) FLUOROSCOPY TIME:  Fluoroscopy Time: 2 minutes 18 seconds (8 mGy). COMPLICATIONS: None immediate. PROCEDURE: Informed written consent was obtained from the patient after a thorough discussion of the procedural risks, benefits and alternatives. All questions were addressed. Maximal Sterile Barrier Technique was utilized including caps, mask, sterile gowns, sterile gloves, sterile drape, hand hygiene and skin antiseptic. A timeout was performed prior to the initiation of the procedure. Patient was placed prone. Both flanks were prepped and draped in a sterile fashion. Left flank was anesthetized with 1% lidocaine. [place] needle was dilated into a mid/ lower pole calyx with ultrasound guidance. Wire was easily advanced in the renal collecting system. Accustick dilator set was placed. Tract was dilated to accommodate a 10.2 [place] multipurpose drain. Blood tinged urine was collected and sent for culture. Small amount of contrast confirmed placement in the renal collecting system. Catheter was sutured to skin. Right flank was anesthetized with 1% lidocaine. Using ultrasound guidance, 22 gauge needle was directed into a mid/ lower pole calyx. Small amount of purulent fluid was draining from the needle. 0.018 wire was advanced to the renal pelvis. Accustick dilator set was placed and the tract was dilated to accommodate a 10.2 [place] multipurpose drain. Bloody fluid was aspirated and sent for culture. Small amount of contrast confirmed placement in the renal collecting system. Catheter was sutured to skin. Both tubes were connected  to gravity bags. Fluoroscopic and ultrasound images were taken and saved for documentation. FINDINGS: Moderate left hydronephrosis. Left nephrostomy tube  is well positioned in the left renal pelvis. Moderate-severe right hydronephrosis. Obstructing stone in the proximal right ureter. Nephrostomy tube is reconstituted in the right renal pelvis. Bloody fluid was removed from the right renal collecting system. IMPRESSION: Successful bilateral percutaneous nephrostomy tubes using ultrasound and fluoroscopic guidance. Bilateral nephrostomy tube samples were sent for culture. Electronically Signed   By: Markus Daft M.D.   On: 06/12/2017 17:50   Recent Results (from the past 240 hour(s))  Culture, blood (Routine x 2)     Status: Abnormal (Preliminary result)   Collection Time: 06/11/17 11:19 AM  Result Value Ref Range Status   Specimen Description BLOOD LEFT ARM  Final   Special Requests   Final    BOTTLES DRAWN AEROBIC ONLY Blood Culture adequate volume   Culture  Setup Time   Final    GRAM POSITIVE COCCI IN CLUSTERS AEROBIC BOTTLE ONLY CRITICAL RESULT CALLED TO, READ BACK BY AND VERIFIED WITH: N. Batchelder Pharm.D. 10:00 06/12/17 ([email or web address])    Culture (A)  Final    STAPHYLOCOCCUS SPECIES (COAGULASE NEGATIVE) SUSCEPTIBILITIES TO FOLLOW    Report Status PENDING  Incomplete  Blood Culture ID Panel (Reflexed)     Status: Abnormal   Collection Time: 06/11/17 11:19 AM  Result Value Ref Range Status   Enterococcus species NOT DETECTED NOT DETECTED Final   Listeria monocytogenes NOT DETECTED NOT DETECTED Final   Staphylococcus species DETECTED (A) NOT DETECTED Final    Comment: Methicillin (oxacillin) resistant coagulase negative staphylococcus. Possible blood culture contaminant (unless isolated from more than one blood culture draw or clinical case suggests pathogenicity). No antibiotic treatment is indicated for blood  culture contaminants. CRITICAL RESULT CALLED TO, READ BACK BY AND VERIFIED WITH: N. Batchelder Pharm.D. 10:00 06/12/17 ([email or web address])    Staphylococcus aureus NOT DETECTED NOT DETECTED Final   Methicillin resistance DETECTED (A) NOT  DETECTED Final    Comment: CRITICAL RESULT CALLED TO, READ BACK BY AND VERIFIED WITH: N. Batchelder Pharm.D. 10:00 06/12/17 ([email or web address])    Streptococcus species NOT DETECTED NOT DETECTED Final   Streptococcus agalactiae NOT DETECTED NOT DETECTED Final   Streptococcus pneumoniae NOT DETECTED NOT DETECTED Final   Streptococcus pyogenes NOT DETECTED NOT DETECTED Final   Acinetobacter baumannii NOT DETECTED NOT DETECTED Final   Enterobacteriaceae species NOT DETECTED NOT DETECTED Final   Enterobacter cloacae complex NOT DETECTED NOT DETECTED Final   Escherichia coli NOT DETECTED NOT DETECTED Final   Klebsiella oxytoca NOT DETECTED NOT DETECTED Final   Klebsiella pneumoniae NOT DETECTED NOT DETECTED Final   Proteus species NOT DETECTED NOT DETECTED Final   Serratia marcescens NOT DETECTED NOT DETECTED Final   Haemophilus influenzae NOT DETECTED NOT DETECTED Final   Neisseria meningitidis NOT DETECTED NOT DETECTED Final   Pseudomonas aeruginosa NOT DETECTED NOT DETECTED Final   Candida albicans NOT DETECTED NOT DETECTED Final   Candida glabrata NOT DETECTED NOT DETECTED Final   Candida krusei NOT DETECTED NOT DETECTED Final   Candida parapsilosis NOT DETECTED NOT DETECTED Final   Candida tropicalis NOT DETECTED NOT DETECTED Final  Culture, blood (Routine x 2)     Status: Abnormal (Preliminary result)   Collection Time: 06/11/17 11:40 AM  Result Value Ref Range Status   Specimen Description BLOOD WRIST RIGHT  Final   Special Requests   Final    BOTTLES DRAWN AEROBIC AND ANAEROBIC Blood Culture adequate volume  Culture  Setup Time   Final    GRAM POSITIVE COCCI IN CLUSTERS ANAEROBIC BOTTLE ONLY CRITICAL VALUE NOTED.  VALUE IS CONSISTENT WITH PREVIOUSLY REPORTED AND CALLED VALUE.    Culture (A)  Final    STAPHYLOCOCCUS SPECIES (COAGULASE NEGATIVE) SUSCEPTIBILITIES TO FOLLOW    Report Status PENDING  Incomplete  Respiratory Panel by PCR     Status: None   Collection Time: 06/11/17  5:39  PM  Result Value Ref Range Status   Adenovirus NOT DETECTED NOT DETECTED Final   Coronavirus 229E NOT DETECTED NOT DETECTED Final   Coronavirus HKU1 NOT DETECTED NOT DETECTED Final   Coronavirus NL63 NOT DETECTED NOT DETECTED Final   Coronavirus OC43 NOT DETECTED NOT DETECTED Final   Metapneumovirus NOT DETECTED NOT DETECTED Final   Rhinovirus / Enterovirus NOT DETECTED NOT DETECTED Final   Influenza A NOT DETECTED NOT DETECTED Final   Influenza B NOT DETECTED NOT DETECTED Final   Parainfluenza Virus 1 NOT DETECTED NOT DETECTED Final   Parainfluenza Virus 2 NOT DETECTED NOT DETECTED Final   Parainfluenza Virus 3 NOT DETECTED NOT DETECTED Final   Parainfluenza Virus 4 NOT DETECTED NOT DETECTED Final   Respiratory Syncytial Virus NOT DETECTED NOT DETECTED Final   Bordetella pertussis NOT DETECTED NOT DETECTED Final   Chlamydophila pneumoniae NOT DETECTED NOT DETECTED Final   Mycoplasma pneumoniae NOT DETECTED NOT DETECTED Final  MRSA PCR Screening     Status: None   Collection Time: 06/12/17 12:21 AM  Result Value Ref Range Status   MRSA by PCR NEGATIVE NEGATIVE Final    Comment:        The GeneXpert MRSA Assay (FDA approved for NASAL specimens only), is one component of a comprehensive MRSA colonization surveillance program. It is not intended to diagnose MRSA infection nor to guide or monitor treatment for MRSA infections.   C difficile quick scan w PCR reflex     Status: None   Collection Time: 06/12/17  1:00 AM  Result Value Ref Range Status   C Diff antigen NEGATIVE NEGATIVE Final   C Diff toxin NEGATIVE NEGATIVE Final   C Diff interpretation No C. difficile detected.  Final  Urine Culture     Status: Abnormal   Collection Time: 06/12/17  5:00 PM  Result Value Ref Range Status   Specimen Description URINE, CATHETERIZED  Final   Special Requests RIGHT NEPHROSTOMY TUBE  Final   Culture >=100,000 COLONIES/mL YEAST (A)  Final   Report Status 06/14/2017 FINAL  Final    Urine Culture     Status: Abnormal   Collection Time: 06/12/17  5:04 PM  Result Value Ref Range Status   Specimen Description URINE, CATHETERIZED  Final   Special Requests LEFT NEPHROSTOMY TUBE  Final   Culture >=100,000 COLONIES/mL YEAST (A)  Final   Report Status 06/14/2017 FINAL  Final  Culture, blood (routine x 2)     Status: Abnormal (Preliminary result)   Collection Time: 06/13/17 12:38 AM  Result Value Ref Range Status   Specimen Description BLOOD RIGHT HAND  Final   Special Requests IN PEDIATRIC BOTTLE Blood Culture adequate volume  Final   Culture  Setup Time   Final    YEAST IN PEDIATRIC BOTTLE CRITICAL VALUE NOTED.  VALUE IS CONSISTENT WITH PREVIOUSLY REPORTED AND CALLED VALUE.    Culture YEAST CULTURE REINCUBATED FOR BETTER GROWTH  (A)  Final   Report Status PENDING  Incomplete  Culture, blood (routine x 2)     Status:  Abnormal (Preliminary result)   Collection Time: 06/13/17 12:38 AM  Result Value Ref Range Status   Specimen Description BLOOD RIGHT HAND  Final   Special Requests IN PEDIATRIC BOTTLE Blood Culture adequate volume  Final   Culture  Setup Time (A)  Final    YEAST IN PEDIATRIC BOTTLE CRITICAL RESULT CALLED TO, READ BACK BY AND VERIFIED WITH: G ABBOTT PHARMD [id number] 06/14/17 A BROWNING    Culture YEAST CULTURE REINCUBATED FOR BETTER GROWTH   Final   Report Status PENDING  Incomplete  Blood Culture ID Panel (Reflexed)     Status: Abnormal   Collection Time: 06/13/17 12:38 AM  Result Value Ref Range Status   Enterococcus species NOT DETECTED NOT DETECTED Final   Listeria monocytogenes NOT DETECTED NOT DETECTED Final   Staphylococcus species NOT DETECTED NOT DETECTED Final   Staphylococcus aureus NOT DETECTED NOT DETECTED Final   Streptococcus species NOT DETECTED NOT DETECTED Final   Streptococcus agalactiae NOT DETECTED NOT DETECTED Final   Streptococcus pneumoniae NOT DETECTED NOT DETECTED Final   Streptococcus pyogenes NOT DETECTED NOT DETECTED  Final   Acinetobacter baumannii NOT DETECTED NOT DETECTED Final   Enterobacteriaceae species NOT DETECTED NOT DETECTED Final   Enterobacter cloacae complex NOT DETECTED NOT DETECTED Final   Escherichia coli NOT DETECTED NOT DETECTED Final   Klebsiella oxytoca NOT DETECTED NOT DETECTED Final   Klebsiella pneumoniae NOT DETECTED NOT DETECTED Final   Proteus species NOT DETECTED NOT DETECTED Final   Serratia marcescens NOT DETECTED NOT DETECTED Final   Haemophilus influenzae NOT DETECTED NOT DETECTED Final   Neisseria meningitidis NOT DETECTED NOT DETECTED Final   Pseudomonas aeruginosa NOT DETECTED NOT DETECTED Final   Candida albicans NOT DETECTED NOT DETECTED Final   Candida glabrata NOT DETECTED NOT DETECTED Final   Candida krusei NOT DETECTED NOT DETECTED Final   Candida parapsilosis NOT DETECTED NOT DETECTED Final   Candida tropicalis DETECTED (A) NOT DETECTED Final    Comment: CRITICAL RESULT CALLED TO, READ BACK BY AND VERIFIED WITH: Denton Brick PHARMD [id number] 06/14/17 A BROWNING     Microbiology: Recent Results (from the past 240 hour(s))  Culture, blood (Routine x 2)     Status: Abnormal (Preliminary result)   Collection Time: 06/11/17 11:19 AM  Result Value Ref Range Status   Specimen Description BLOOD LEFT ARM  Final   Special Requests   Final    BOTTLES DRAWN AEROBIC ONLY Blood Culture adequate volume   Culture  Setup Time   Final    GRAM POSITIVE COCCI IN CLUSTERS AEROBIC BOTTLE ONLY CRITICAL RESULT CALLED TO, READ BACK BY AND VERIFIED WITH: N. Batchelder Pharm.D. 10:00 06/12/17 ([email or web address])    Culture (A)  Final    STAPHYLOCOCCUS SPECIES (COAGULASE NEGATIVE) SUSCEPTIBILITIES TO FOLLOW    Report Status PENDING  Incomplete  Blood Culture ID Panel (Reflexed)     Status: Abnormal   Collection Time: 06/11/17 11:19 AM  Result Value Ref Range Status   Enterococcus species NOT DETECTED NOT DETECTED Final   Listeria monocytogenes NOT DETECTED NOT DETECTED Final   Staphylococcus  species DETECTED (A) NOT DETECTED Final    Comment: Methicillin (oxacillin) resistant coagulase negative staphylococcus. Possible blood culture contaminant (unless isolated from more than one blood culture draw or clinical case suggests pathogenicity). No antibiotic treatment is indicated for blood  culture contaminants. CRITICAL RESULT CALLED TO, READ BACK BY AND VERIFIED WITH: N. Batchelder Pharm.D. 10:00 06/12/17 ([email or web address])    Staphylococcus aureus NOT DETECTED NOT DETECTED Final  Methicillin resistance DETECTED (A) NOT DETECTED Final    Comment: CRITICAL RESULT CALLED TO, READ BACK BY AND VERIFIED WITH: N. Batchelder Pharm.D. 10:00 06/12/17 (wilsonm)    Streptococcus species NOT DETECTED NOT DETECTED Final   Streptococcus agalactiae NOT DETECTED NOT DETECTED Final   Streptococcus pneumoniae NOT DETECTED NOT DETECTED Final   Streptococcus pyogenes NOT DETECTED NOT DETECTED Final   Acinetobacter baumannii NOT DETECTED NOT DETECTED Final   Enterobacteriaceae species NOT DETECTED NOT DETECTED Final   Enterobacter cloacae complex NOT DETECTED NOT DETECTED Final   Escherichia coli NOT DETECTED NOT DETECTED Final   Klebsiella oxytoca NOT DETECTED NOT DETECTED Final   Klebsiella pneumoniae NOT DETECTED NOT DETECTED Final   Proteus species NOT DETECTED NOT DETECTED Final   Serratia marcescens NOT DETECTED NOT DETECTED Final   Haemophilus influenzae NOT DETECTED NOT DETECTED Final   Neisseria meningitidis NOT DETECTED NOT DETECTED Final   Pseudomonas aeruginosa NOT DETECTED NOT DETECTED Final   Candida albicans NOT DETECTED NOT DETECTED Final   Candida glabrata NOT DETECTED NOT DETECTED Final   Candida krusei NOT DETECTED NOT DETECTED Final   Candida parapsilosis NOT DETECTED NOT DETECTED Final   Candida tropicalis NOT DETECTED NOT DETECTED Final  Culture, blood (Routine x 2)     Status: Abnormal (Preliminary result)   Collection Time: 06/11/17 11:40 AM  Result Value Ref Range Status    Specimen Description BLOOD WRIST RIGHT  Final   Special Requests   Final    BOTTLES DRAWN AEROBIC AND ANAEROBIC Blood Culture adequate volume   Culture  Setup Time   Final    GRAM POSITIVE COCCI IN CLUSTERS ANAEROBIC BOTTLE ONLY CRITICAL VALUE NOTED.  VALUE IS CONSISTENT WITH PREVIOUSLY REPORTED AND CALLED VALUE.    Culture (A)  Final    STAPHYLOCOCCUS SPECIES (COAGULASE NEGATIVE) SUSCEPTIBILITIES TO FOLLOW    Report Status PENDING  Incomplete  Respiratory Panel by PCR     Status: None   Collection Time: 06/11/17  5:39 PM  Result Value Ref Range Status   Adenovirus NOT DETECTED NOT DETECTED Final   Coronavirus 229E NOT DETECTED NOT DETECTED Final   Coronavirus HKU1 NOT DETECTED NOT DETECTED Final   Coronavirus NL63 NOT DETECTED NOT DETECTED Final   Coronavirus OC43 NOT DETECTED NOT DETECTED Final   Metapneumovirus NOT DETECTED NOT DETECTED Final   Rhinovirus / Enterovirus NOT DETECTED NOT DETECTED Final   Influenza A NOT DETECTED NOT DETECTED Final   Influenza B NOT DETECTED NOT DETECTED Final   Parainfluenza Virus 1 NOT DETECTED NOT DETECTED Final   Parainfluenza Virus 2 NOT DETECTED NOT DETECTED Final   Parainfluenza Virus 3 NOT DETECTED NOT DETECTED Final   Parainfluenza Virus 4 NOT DETECTED NOT DETECTED Final   Respiratory Syncytial Virus NOT DETECTED NOT DETECTED Final   Bordetella pertussis NOT DETECTED NOT DETECTED Final   Chlamydophila pneumoniae NOT DETECTED NOT DETECTED Final   Mycoplasma pneumoniae NOT DETECTED NOT DETECTED Final  MRSA PCR Screening     Status: None   Collection Time: 06/12/17 12:21 AM  Result Value Ref Range Status   MRSA by PCR NEGATIVE NEGATIVE Final    Comment:        The GeneXpert MRSA Assay (FDA approved for NASAL specimens only), is one component of a comprehensive MRSA colonization surveillance program. It is not intended to diagnose MRSA infection nor to guide or monitor treatment for MRSA infections.   C difficile quick scan w PCR  reflex     Status: None  Collection Time: 06/12/17  1:00 AM  Result Value Ref Range Status   C Diff antigen NEGATIVE NEGATIVE Final   C Diff toxin NEGATIVE NEGATIVE Final   C Diff interpretation No C. difficile detected.  Final  Urine Culture     Status: Abnormal   Collection Time: 06/12/17  5:00 PM  Result Value Ref Range Status   Specimen Description URINE, CATHETERIZED  Final   Special Requests RIGHT NEPHROSTOMY TUBE  Final   Culture >=100,000 COLONIES/mL YEAST (A)  Final   Report Status 06/14/2017 FINAL  Final  Urine Culture     Status: Abnormal   Collection Time: 06/12/17  5:04 PM  Result Value Ref Range Status   Specimen Description URINE, CATHETERIZED  Final   Special Requests LEFT NEPHROSTOMY TUBE  Final   Culture >=100,000 COLONIES/mL YEAST (A)  Final   Report Status 06/14/2017 FINAL  Final  Culture, blood (routine x 2)     Status: Abnormal (Preliminary result)   Collection Time: 06/13/17 12:38 AM  Result Value Ref Range Status   Specimen Description BLOOD RIGHT HAND  Final   Special Requests IN PEDIATRIC BOTTLE Blood Culture adequate volume  Final   Culture  Setup Time   Final    YEAST IN PEDIATRIC BOTTLE CRITICAL VALUE NOTED.  VALUE IS CONSISTENT WITH PREVIOUSLY REPORTED AND CALLED VALUE.    Culture YEAST CULTURE REINCUBATED FOR BETTER GROWTH  (A)  Final   Report Status PENDING  Incomplete  Culture, blood (routine x 2)     Status: Abnormal (Preliminary result)   Collection Time: 06/13/17 12:38 AM  Result Value Ref Range Status   Specimen Description BLOOD RIGHT HAND  Final   Special Requests IN PEDIATRIC BOTTLE Blood Culture adequate volume  Final   Culture  Setup Time (A)  Final    YEAST IN PEDIATRIC BOTTLE CRITICAL RESULT CALLED TO, READ BACK BY AND VERIFIED WITH: G ABBOTT PHARMD [id number] 06/14/17 A BROWNING    Culture YEAST CULTURE REINCUBATED FOR BETTER GROWTH   Final   Report Status PENDING  Incomplete  Blood Culture ID Panel (Reflexed)     Status:  Abnormal   Collection Time: 06/13/17 12:38 AM  Result Value Ref Range Status   Enterococcus species NOT DETECTED NOT DETECTED Final   Listeria monocytogenes NOT DETECTED NOT DETECTED Final   Staphylococcus species NOT DETECTED NOT DETECTED Final   Staphylococcus aureus NOT DETECTED NOT DETECTED Final   Streptococcus species NOT DETECTED NOT DETECTED Final   Streptococcus agalactiae NOT DETECTED NOT DETECTED Final   Streptococcus pneumoniae NOT DETECTED NOT DETECTED Final   Streptococcus pyogenes NOT DETECTED NOT DETECTED Final   Acinetobacter baumannii NOT DETECTED NOT DETECTED Final   Enterobacteriaceae species NOT DETECTED NOT DETECTED Final   Enterobacter cloacae complex NOT DETECTED NOT DETECTED Final   Escherichia coli NOT DETECTED NOT DETECTED Final   Klebsiella oxytoca NOT DETECTED NOT DETECTED Final   Klebsiella pneumoniae NOT DETECTED NOT DETECTED Final   Proteus species NOT DETECTED NOT DETECTED Final   Serratia marcescens NOT DETECTED NOT DETECTED Final   Haemophilus influenzae NOT DETECTED NOT DETECTED Final   Neisseria meningitidis NOT DETECTED NOT DETECTED Final   Pseudomonas aeruginosa NOT DETECTED NOT DETECTED Final   Candida albicans NOT DETECTED NOT DETECTED Final   Candida glabrata NOT DETECTED NOT DETECTED Final   Candida krusei NOT DETECTED NOT DETECTED Final   Candida parapsilosis NOT DETECTED NOT DETECTED Final   Candida tropicalis DETECTED (A) NOT DETECTED Final  Comment: CRITICAL RESULT CALLED TO, READ BACK BY AND VERIFIED WITH: Denton Brick PHARMD [id number] 06/14/17 A BROWNING     Radiographs and labs were personally reviewed by me.   Bobby Rumpf, MD [facility] [phone] 06/14/2017, 2:17 PM

## 2017-06-15 ENCOUNTER — Encounter (HOSPITAL_COMMUNITY): Payer: Self-pay | Admitting: Radiology

## 2017-06-15 DIAGNOSIS — M25511 Pain in right shoulder: Secondary | ICD-10-CM

## 2017-06-15 DIAGNOSIS — Z89512 Acquired absence of left leg below knee: Secondary | ICD-10-CM

## 2017-06-15 DIAGNOSIS — E873 Alkalosis: Secondary | ICD-10-CM

## 2017-06-15 DIAGNOSIS — B377 Candidal sepsis: Secondary | ICD-10-CM | POA: Diagnosis present

## 2017-06-15 DIAGNOSIS — R7881 Bacteremia: Secondary | ICD-10-CM

## 2017-06-15 DIAGNOSIS — D649 Anemia, unspecified: Secondary | ICD-10-CM

## 2017-06-15 DIAGNOSIS — A4902 Methicillin resistant Staphylococcus aureus infection, unspecified site: Secondary | ICD-10-CM

## 2017-06-15 DIAGNOSIS — E119 Type 2 diabetes mellitus without complications: Secondary | ICD-10-CM

## 2017-06-15 DIAGNOSIS — Z89421 Acquired absence of other right toe(s): Secondary | ICD-10-CM

## 2017-06-15 DIAGNOSIS — F0390 Unspecified dementia without behavioral disturbance: Secondary | ICD-10-CM

## 2017-06-15 LAB — BASIC METABOLIC PANEL
Anion gap: 4 — ABNORMAL LOW (ref 5–15)
BUN: 23 mg/dL — ABNORMAL HIGH (ref 6–20)
CALCIUM: 7.9 mg/dL — AB (ref 8.9–10.3)
CO2: 30 mmol/L (ref 22–32)
CREATININE: 0.95 mg/dL (ref 0.61–1.24)
Chloride: 111 mmol/L (ref 101–111)
GFR calc Af Amer: >60 mL/min (ref >60)
GFR calc non Af Amer: >60 mL/min (ref >60)
GLUCOSE: 318 mg/dL — AB (ref 65–99)
Potassium: 3.2 mmol/L — ABNORMAL LOW (ref 3.5–5.1)
Sodium: 145 mmol/L (ref 135–145)

## 2017-06-15 LAB — CULTURE, BLOOD (ROUTINE X 2)
SPECIAL REQUESTS: ADEQUATE
SPECIAL REQUESTS: ADEQUATE
SPECIAL REQUESTS: ADEQUATE
Special Requests: ADEQUATE

## 2017-06-15 LAB — GLUCOSE, CAPILLARY
GLUCOSE-CAPILLARY: 303 mg/dL — AB (ref 65–99)
Glucose-Capillary: 281 mg/dL — ABNORMAL HIGH (ref 65–99)
Glucose-Capillary: 290 mg/dL — ABNORMAL HIGH (ref 65–99)
Glucose-Capillary: 288 mg/dL — ABNORMAL HIGH (ref 65–99)

## 2017-06-15 LAB — CBC
HEMATOCRIT: 35.7 % — AB (ref 39.0–52.0)
Hemoglobin: 11.1 g/dL — ABNORMAL LOW (ref 13.0–17.0)
MCH: 29.1 pg (ref 26.0–34.0)
MCHC: 31.1 g/dL (ref 30.0–36.0)
MCV: 93.7 fL (ref 78.0–100.0)
Platelets: 206 10*3/uL (ref 150–400)
RBC: 3.81 MIL/uL — ABNORMAL LOW (ref 4.22–5.81)
RDW: 13.1 % (ref 11.5–15.5)
WBC: 9.4 10*3/uL (ref 4.0–10.5)

## 2017-06-15 MED ORDER — LOPERAMIDE HCL 1 MG/5ML PO LIQD
4.0000 mg | Freq: Once | ORAL | Status: AC
  Filled 2017-06-15: qty 20

## 2017-06-15 MED ORDER — LOPERAMIDE HCL 2 MG PO CAPS
4.0000 mg | ORAL_CAPSULE | Freq: Once | ORAL | Status: AC
  Administered 2017-06-15: 4 mg via ORAL
  Filled 2017-06-15: qty 2

## 2017-06-15 MED ORDER — CYCLOBENZAPRINE HCL 10 MG PO TABS
5.0000 mg | ORAL_TABLET | Freq: Every day | ORAL | Status: AC
  Administered 2017-06-15: 5 mg via ORAL
  Filled 2017-06-15: qty 1

## 2017-06-15 MED ORDER — LOPERAMIDE HCL 2 MG PO CAPS
2.0000 mg | ORAL_CAPSULE | ORAL | Status: DC | PRN
  Administered 2017-06-15: 2 mg via ORAL
  Filled 2017-06-15: qty 1

## 2017-06-15 MED ORDER — INSULIN ASPART 100 UNIT/ML ~~LOC~~ SOLN
0.0000 [IU] | Freq: Three times a day (TID) | SUBCUTANEOUS | Status: DC
  Administered 2017-06-15 (×2): 8 [IU] via SUBCUTANEOUS
  Administered 2017-06-16: 3 [IU] via SUBCUTANEOUS
  Administered 2017-06-16: 8 [IU] via SUBCUTANEOUS
  Administered 2017-06-16: 5 [IU] via SUBCUTANEOUS
  Administered 2017-06-17: 3 [IU] via SUBCUTANEOUS
  Administered 2017-06-17: 5 [IU] via SUBCUTANEOUS
  Administered 2017-06-17: 8 [IU] via SUBCUTANEOUS

## 2017-06-15 MED ORDER — LOPERAMIDE HCL 1 MG/5ML PO LIQD
2.0000 mg | ORAL | Status: DC | PRN
  Filled 2017-06-15: qty 10

## 2017-06-15 MED ORDER — POTASSIUM CHLORIDE 10 MEQ/100ML IV SOLN
10.0000 meq | INTRAVENOUS | Status: AC
  Administered 2017-06-15 (×5): 10 meq via INTRAVENOUS
  Filled 2017-06-15 (×4): qty 100

## 2017-06-15 MED ORDER — POTASSIUM CHLORIDE 10 MEQ/100ML IV SOLN
10.0000 meq | INTRAVENOUS | Status: AC
  Administered 2017-06-15: 10 meq via INTRAVENOUS
  Filled 2017-06-15 (×2): qty 100

## 2017-06-15 NOTE — Progress Notes (Signed)
INFECTIOUS DISEASE PROGRESS NOTE  ID: Alan [name]. is a 71 y.o. male with  Active Problems:   Sepsis (HCC)   Obstruction of kidney   Candidemia (HCC)  Subjective: No complaints.   Abtx:  Anti-infectives (From admission, onward)   Start     Dose/Rate Route Frequency Ordered Stop   06/15/17 1000  fluconazole (DIFLUCAN) IVPB 200 mg  Status:  Discontinued     200 mg 100 mL/hr over 60 Minutes Intravenous Every 24 hours 06/14/17 0131 06/14/17 1500   06/15/17 0000  anidulafungin (ERAXIS) 100 mg in sodium chloride 0.9 % 100 mL IVPB     100 mg 78 mL/hr over 100 Minutes Intravenous Every 24 hours 06/14/17 1500     06/14/17 1515  anidulafungin (ERAXIS) 200 mg in sodium chloride 0.9 % 200 mL IVPB    Comments:  Please link to 100mg  daily starting tomorrow   200 mg 78 mL/hr over 200 Minutes Intravenous  Once 06/14/17 1500 06/14/17 2029   06/14/17 1300  vancomycin (VANCOCIN) IVPB 1000 mg/200 mL premix     1,000 mg 200 mL/hr over 60 Minutes Intravenous Every 12 hours 06/14/17 1254     06/14/17 0145  fluconazole (DIFLUCAN) IVPB 400 mg     400 mg 100 mL/hr over 120 Minutes Intravenous  Once 06/14/17 0131 06/14/17 0547   06/13/17 1600  vancomycin (VANCOCIN) 1,250 mg in sodium chloride 0.9 % 250 mL IVPB  Status:  Discontinued     1,250 mg 166.7 mL/hr over 90 Minutes Intravenous Every 24 hours 06/13/17 0222 06/14/17 1254   06/12/17 1630  ciprofloxacin (CIPRO) IVPB 400 mg     400 mg 200 mL/hr over 60 Minutes Intravenous  Once 06/12/17 1554 06/12/17 1708   06/12/17 0100  vancomycin (VANCOCIN) IVPB 750 mg/150 ml premix  Status:  Discontinued     750 mg 150 mL/hr over 60 Minutes Intravenous Every 12 hours 06/11/17 1143 06/13/17 0216   06/11/17 2000  fluconazole (DIFLUCAN) IVPB 200 mg  Status:  Discontinued     200 mg 100 mL/hr over 60 Minutes Intravenous Every 24 hours 06/11/17 1826 06/12/17 1301   06/11/17 1800  piperacillin-tazobactam (ZOSYN) IVPB 3.375 g  Status:  Discontinued     3.375 g 12.5 mL/hr over 240 Minutes Intravenous Every 8 hours 06/11/17 1143 06/11/17 1731   06/11/17 1745  ceFEPIme (MAXIPIME) 2 g in dextrose 5 % 50 mL IVPB  Status:  Discontinued     2 g 100 mL/hr over 30 Minutes Intravenous Every 24 hours 06/11/17 1738 06/12/17 1055   06/11/17 1200  piperacillin-tazobactam (ZOSYN) IVPB 3.375 g     3.375 g 100 mL/hr over 30 Minutes Intravenous  Once 06/11/17 1140 06/11/17 1230   06/11/17 1145  vancomycin (VANCOCIN) 1,500 mg in sodium chloride 0.9 % 500 mL IVPB     1,500 mg 250 mL/hr over 120 Minutes Intravenous  Once 06/11/17 1140 06/11/17 1408      Medications:  Scheduled: . cyclobenzaprine  5 mg Oral QHS  . heparin  5,000 Units Subcutaneous Q8H  . insulin aspart  0-15 Units Subcutaneous TID WC  . insulin glargine  30 Units Subcutaneous QHS  . lidocaine  1 patch Transdermal Q24H    Objective: Vital signs in last 24 hours: Temp:  [98.6 F (37 C)-99.2 F (37.3 C)] 98.6 F (37 C) (12/23 0856) Pulse Rate:  [70-82] 73 (12/23 0250) Resp:  [21-24] 21 (12/23 0250) BP: (150-170)/(82-85) 167/83 (12/23 0856) SpO2:  [95 %-97 %] 96 % (  12/23 0856)   General appearance: alert, cooperative and no distress Resp: clear to auscultation bilaterally Cardio: regular rate and rhythm GI: normal findings: bowel sounds normal and soft, non-tender Extremities: edema none  Lab Results Recent Labs    06/14/17 0224 06/15/17 0211  WBC 10.6* 9.4  HGB 12.8* 11.1*  HCT 40.0 35.7*  NA 146* 145  K 3.4* 3.2*  CL 114* 111  CO2 25 30  BUN 36* 23*  CREATININE 1.14 0.95   Liver Panel No results for input(s): PROT, ALBUMIN, AST, ALT, ALKPHOS, BILITOT, BILIDIR, IBILI in the last 72 hours. Sedimentation Rate No results for input(s): ESRSEDRATE in the last 72 hours. C-Reactive Protein No results for input(s): CRP in the last 72 hours.  Microbiology: Recent Results (from the past 240 hour(s))  Culture, blood (Routine x 2)     Status: Abnormal   Collection  Time: 06/11/17 11:19 AM  Result Value Ref Range Status   Specimen Description BLOOD LEFT ARM  Final   Special Requests   Final    BOTTLES DRAWN AEROBIC ONLY Blood Culture adequate volume   Culture  Setup Time   Final    GRAM POSITIVE COCCI IN CLUSTERS AEROBIC BOTTLE ONLY CRITICAL RESULT CALLED TO, READ BACK BY AND VERIFIED WITH: N. Batchelder Pharm.D. 10:00 06/12/17 ([email or web address])    Culture (A)  Final    STAPHYLOCOCCUS SPECIES (COAGULASE NEGATIVE) SUSCEPTIBILITIES PERFORMED ON PREVIOUS CULTURE WITHIN THE LAST 5 DAYS.    Report Status 06/15/2017 FINAL  Final  Blood Culture ID Panel (Reflexed)     Status: Abnormal   Collection Time: 06/11/17 11:19 AM  Result Value Ref Range Status   Enterococcus species NOT DETECTED NOT DETECTED Final   Listeria monocytogenes NOT DETECTED NOT DETECTED Final   Staphylococcus species DETECTED (A) NOT DETECTED Final    Comment: Methicillin (oxacillin) resistant coagulase negative staphylococcus. Possible blood culture contaminant (unless isolated from more than one blood culture draw or clinical case suggests pathogenicity). No antibiotic treatment is indicated for blood  culture contaminants. CRITICAL RESULT CALLED TO, READ BACK BY AND VERIFIED WITH: N. Batchelder Pharm.D. 10:00 06/12/17 ([email or web address])    Staphylococcus aureus NOT DETECTED NOT DETECTED Final   Methicillin resistance DETECTED (A) NOT DETECTED Final    Comment: CRITICAL RESULT CALLED TO, READ BACK BY AND VERIFIED WITH: N. Batchelder Pharm.D. 10:00 06/12/17 ([email or web address])    Streptococcus species NOT DETECTED NOT DETECTED Final   Streptococcus agalactiae NOT DETECTED NOT DETECTED Final   Streptococcus pneumoniae NOT DETECTED NOT DETECTED Final   Streptococcus pyogenes NOT DETECTED NOT DETECTED Final   Acinetobacter baumannii NOT DETECTED NOT DETECTED Final   Enterobacteriaceae species NOT DETECTED NOT DETECTED Final   Enterobacter cloacae complex NOT DETECTED NOT DETECTED Final   Escherichia coli  NOT DETECTED NOT DETECTED Final   Klebsiella oxytoca NOT DETECTED NOT DETECTED Final   Klebsiella pneumoniae NOT DETECTED NOT DETECTED Final   Proteus species NOT DETECTED NOT DETECTED Final   Serratia marcescens NOT DETECTED NOT DETECTED Final   Haemophilus influenzae NOT DETECTED NOT DETECTED Final   Neisseria meningitidis NOT DETECTED NOT DETECTED Final   Pseudomonas aeruginosa NOT DETECTED NOT DETECTED Final   Candida albicans NOT DETECTED NOT DETECTED Final   Candida glabrata NOT DETECTED NOT DETECTED Final   Candida krusei NOT DETECTED NOT DETECTED Final   Candida parapsilosis NOT DETECTED NOT DETECTED Final   Candida tropicalis NOT DETECTED NOT DETECTED Final  Culture, blood (Routine x 2)     Status: Abnormal   Collection  Time: 06/11/17 11:40 AM  Result Value Ref Range Status   Specimen Description BLOOD WRIST RIGHT  Final   Special Requests   Final    BOTTLES DRAWN AEROBIC AND ANAEROBIC Blood Culture adequate volume   Culture  Setup Time   Final    GRAM POSITIVE COCCI IN CLUSTERS ANAEROBIC BOTTLE ONLY CRITICAL VALUE NOTED.  VALUE IS CONSISTENT WITH PREVIOUSLY REPORTED AND CALLED VALUE.    Culture STAPHYLOCOCCUS SPECIES (COAGULASE NEGATIVE) (A)  Final   Report Status 06/15/2017 FINAL  Final   Organism ID, Bacteria STAPHYLOCOCCUS SPECIES (COAGULASE NEGATIVE)  Final      Susceptibility   Staphylococcus species (coagulase negative) - MIC*    CIPROFLOXACIN >=8 RESISTANT Resistant     ERYTHROMYCIN >=8 RESISTANT Resistant     GENTAMICIN <=0.5 SENSITIVE Sensitive     OXACILLIN RESISTANT Resistant     TETRACYCLINE <=1 SENSITIVE Sensitive     VANCOMYCIN 1 SENSITIVE Sensitive     TRIMETH/SULFA 80 RESISTANT Resistant     CLINDAMYCIN RESISTANT Resistant     RIFAMPIN <=0.5 SENSITIVE Sensitive     Inducible Clindamycin POSITIVE Resistant     * STAPHYLOCOCCUS SPECIES (COAGULASE NEGATIVE)  Respiratory Panel by PCR     Status: None   Collection Time: 06/11/17  5:39 PM  Result Value  Ref Range Status   Adenovirus NOT DETECTED NOT DETECTED Final   Coronavirus 229E NOT DETECTED NOT DETECTED Final   Coronavirus HKU1 NOT DETECTED NOT DETECTED Final   Coronavirus NL63 NOT DETECTED NOT DETECTED Final   Coronavirus OC43 NOT DETECTED NOT DETECTED Final   Metapneumovirus NOT DETECTED NOT DETECTED Final   Rhinovirus / Enterovirus NOT DETECTED NOT DETECTED Final   Influenza A NOT DETECTED NOT DETECTED Final   Influenza B NOT DETECTED NOT DETECTED Final   Parainfluenza Virus 1 NOT DETECTED NOT DETECTED Final   Parainfluenza Virus 2 NOT DETECTED NOT DETECTED Final   Parainfluenza Virus 3 NOT DETECTED NOT DETECTED Final   Parainfluenza Virus 4 NOT DETECTED NOT DETECTED Final   Respiratory Syncytial Virus NOT DETECTED NOT DETECTED Final   Bordetella pertussis NOT DETECTED NOT DETECTED Final   Chlamydophila pneumoniae NOT DETECTED NOT DETECTED Final   Mycoplasma pneumoniae NOT DETECTED NOT DETECTED Final  MRSA PCR Screening     Status: None   Collection Time: 06/12/17 12:21 AM  Result Value Ref Range Status   MRSA by PCR NEGATIVE NEGATIVE Final    Comment:        The GeneXpert MRSA Assay (FDA approved for NASAL specimens only), is one component of a comprehensive MRSA colonization surveillance program. It is not intended to diagnose MRSA infection nor to guide or monitor treatment for MRSA infections.   C difficile quick scan w PCR reflex     Status: None   Collection Time: 06/12/17  1:00 AM  Result Value Ref Range Status   C Diff antigen NEGATIVE NEGATIVE Final   C Diff toxin NEGATIVE NEGATIVE Final   C Diff interpretation No C. difficile detected.  Final  Urine Culture     Status: Abnormal   Collection Time: 06/12/17  5:00 PM  Result Value Ref Range Status   Specimen Description URINE, CATHETERIZED  Final   Special Requests RIGHT NEPHROSTOMY TUBE  Final   Culture >=100,000 COLONIES/mL YEAST (A)  Final   Report Status 06/14/2017 FINAL  Final  Urine Culture      Status: Abnormal   Collection Time: 06/12/17  5:04 PM  Result Value Ref Range Status  Specimen Description URINE, CATHETERIZED  Final   Special Requests LEFT NEPHROSTOMY TUBE  Final   Culture >=100,000 COLONIES/mL YEAST (A)  Final   Report Status 06/14/2017 FINAL  Final  Culture, blood (routine x 2)     Status: Abnormal (Preliminary result)   Collection Time: 06/13/17 12:38 AM  Result Value Ref Range Status   Specimen Description BLOOD RIGHT HAND  Final   Special Requests IN PEDIATRIC BOTTLE Blood Culture adequate volume  Final   Culture  Setup Time   Final    YEAST IN PEDIATRIC BOTTLE CRITICAL VALUE NOTED.  VALUE IS CONSISTENT WITH PREVIOUSLY REPORTED AND CALLED VALUE.    Culture YEAST IDENTIFICATION TO FOLLOW  (A)  Final   Report Status PENDING  Incomplete  Culture, blood (routine x 2)     Status: Abnormal (Preliminary result)   Collection Time: 06/13/17 12:38 AM  Result Value Ref Range Status   Specimen Description BLOOD RIGHT HAND  Final   Special Requests IN PEDIATRIC BOTTLE Blood Culture adequate volume  Final   Culture  Setup Time (A)  Final    YEAST IN PEDIATRIC BOTTLE CRITICAL RESULT CALLED TO, READ BACK BY AND VERIFIED WITH: G ABBOTT PHARMD [id number] 06/14/17 A BROWNING    Culture YEAST IDENTIFICATION TO FOLLOW   Final   Report Status PENDING  Incomplete  Blood Culture ID Panel (Reflexed)     Status: Abnormal   Collection Time: 06/13/17 12:38 AM  Result Value Ref Range Status   Enterococcus species NOT DETECTED NOT DETECTED Final   Listeria monocytogenes NOT DETECTED NOT DETECTED Final   Staphylococcus species NOT DETECTED NOT DETECTED Final   Staphylococcus aureus NOT DETECTED NOT DETECTED Final   Streptococcus species NOT DETECTED NOT DETECTED Final   Streptococcus agalactiae NOT DETECTED NOT DETECTED Final   Streptococcus pneumoniae NOT DETECTED NOT DETECTED Final   Streptococcus pyogenes NOT DETECTED NOT DETECTED Final   Acinetobacter baumannii NOT DETECTED NOT  DETECTED Final   Enterobacteriaceae species NOT DETECTED NOT DETECTED Final   Enterobacter cloacae complex NOT DETECTED NOT DETECTED Final   Escherichia coli NOT DETECTED NOT DETECTED Final   Klebsiella oxytoca NOT DETECTED NOT DETECTED Final   Klebsiella pneumoniae NOT DETECTED NOT DETECTED Final   Proteus species NOT DETECTED NOT DETECTED Final   Serratia marcescens NOT DETECTED NOT DETECTED Final   Haemophilus influenzae NOT DETECTED NOT DETECTED Final   Neisseria meningitidis NOT DETECTED NOT DETECTED Final   Pseudomonas aeruginosa NOT DETECTED NOT DETECTED Final   Candida albicans NOT DETECTED NOT DETECTED Final   Candida glabrata NOT DETECTED NOT DETECTED Final   Candida krusei NOT DETECTED NOT DETECTED Final   Candida parapsilosis NOT DETECTED NOT DETECTED Final   Candida tropicalis DETECTED (A) NOT DETECTED Final    Comment: CRITICAL RESULT CALLED TO, READ BACK BY AND VERIFIED WITH: Evelena Peat PHARMD [id number] 06/14/17 A BROWNING     Studies/Results: No results found.   Assessment/Plan: Fungemia MRSE bacteremia  TTE (-) for vegetation Hydronephrosis ARF  Total days of antibiotics: 3 vanco        1 anidulafungin  Temps have improved Repeat BCx pending For further uro procedures. Hopefully his BCx have cleared, rarely uro stents will get infected. They are then very difficult to manage.  I would give him 2 weeks of eraxis and vanco when his second set of BCx return negative.  If positive, he needs TEE.  Dr Orvan Falconer available as needed 12-24 to 12-26  Johny Sax MD, FACP Infectious Diseases (pager) [phone] [email or web address] 06/15/2017, 11:24 AM  LOS: 4 days

## 2017-06-15 NOTE — Progress Notes (Addendum)
  Date: 06/15/2017  Patient name: Alan SeedsGlenn A Godino Jr.  Medical record number: [id number]  Date of birth: [birth year]   I have seen and evaluated this patient and I have discussed the plan of care with the house staff. Please see Dr. Reginal LutesHelberg's note for complete details. I concur with his findings.    Patient's wife tells me that IR is considering procedure for internalization of nephrostomy tubes tomorrow. Will ensure he is NPO at midnight and confer with that team.   [name], [name], MD 06/15/2017, 11:17 AM

## 2017-06-15 NOTE — Progress Notes (Signed)
   Subjective: Patient's wife is at bedside. She states that the patient has improved over the interval and is now more mentally clear. He is experiencing right shoulder pain. This is a recurrent issue that flares up when the patient is immobile for log periods of time. The voltaren gel is helping with the pain. We discussed that we are continuing treatment for his bacteremia and fungemia, we will consult IR today for evaluation of internalizing his nephrostomy tubes, and that infectious disease will continue to follow. They voice understanding and agree with the plan. All questions and concerns addressed.   Objective: Vital signs in last 24 hours: Vitals:   06/14/17 1628 06/14/17 1953 06/14/17 2359 06/15/17 0250  BP: (!) 167/83 (!) 150/83 (!) 166/82 (!) 170/83  Pulse:  82 70 73  Resp: (!) 22 (!) 21 (!) 24 (!) 21  Temp: 99.2 F (37.3 C) 98.9 F (37.2 C) 98.9 F (37.2 C) 99 F (37.2 C)  TempSrc: Oral Oral Oral Oral  SpO2: 96% 96% 95% 96%  Weight:      Height:       General: Thin male in no acute distress Pulm: Good air moment with no wheezing or crackles  CV: RRR, no murmurs, no rubs  Abdomen: Active bowel sounds, soft, non-distended, no tenderness to palpation, nephrostomy tubes in place  Extremities: 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 Urology is following and will treat obstructive nephrolithiasis once infection is resolved. They are currently recommending 14 days of antibiotic/antifungal therapy and IR consult for internalize nephrostomy tubes as patient is high risk for pulling the tubes out given his underlying dementia. He will follow up with  them as an outpatient.  - Transthoracic echocardiogram on 12/21 illustrating no signs of endocarditis - Repeat blood cultures today  - ID onboard, recommend continuing Vancomycin (Day # 5/10) and switched to Eraxis (Day # 2/14). Will touch base with ID to discuss repeat TTE and ophthalmology consult to rule out endophthalmitis  Type 2 DM  - Glucose continues to be elevated - Currently: CBG q4 with S-SSI, meal coverage and lantus 20U QHS - Changed SSI to moderate   Hypokalemia  - Replete with IV, will avoid oral for now since the patient is primarily lying flat and PO potassium can cause a pill esophagitis - Of note the patient does have a metabolic alkalosis this AM, previously had a metabolic acidosis. Will continue to monitor  Normocytic Anemia  - Hgb 11. 1 today, stable compared to prior. RDW is within normal limits  - Will continue to monitor. No further evaluation at this point.   AKI. Resolved Prolonged QTc. Resolved   Dispo: Anticipated discharge in approximately >1 day(s).   Levora DredgeHelberg, Dinero Chavira, MD 06/15/2017, 6:10 AM My Pager: [phone]

## 2017-06-15 NOTE — Consult Note (Signed)
Ophthalmology Initial Consult Note  Alan SeedsBennett, Diana A Jr., 71 y.o. male Date of Service:  06/15/2017  Requesting physician: [name], Emily B, MD  Information Obtained from: Chart review and wife Chief Complaint:  Fungemia  HPI/Discussion:  Alan SeedsGlenn A Pardo Jr. is a 71 y.o. male with a complex medical history presents with fungemia. Ophthalmology was consulted to rule out ocular involvement. He normally wears readers, which he does not have with him. He denies blurry vision, flashes, or floaters. He reports no visual changes whatsoever.   Past Ocular Hx:  Denies Ocular Meds:  None Family ocular history: Noncontributory  Past Medical History:  Diagnosis Date  . Anemia   . Arthritis   . Dementia   . Diabetes mellitus without complication (HCC)   . ETOH abuse   . Myocardial infarction (HCC)   . Peripheral arterial disease (HCC)   . Stroke (HCC)   . Wears dentures   . Wears glasses    Past Surgical History:  Procedure Laterality Date  . ABDOMINAL AORTOGRAM N/A 10/25/2016   Procedure: Abdominal Aortogram;  Surgeon: Sherren KernsFields, Charles E, MD;  [facility];  Service: Cardiovascular;  Laterality: N/A;  . BRAIN SURGERY    . CORONARY STENT PLACEMENT     " about 25 years ago (10/29/16)  . ENDARTERECTOMY FEMORAL Right 10/30/2016   Procedure: ENDARTERECTOMY FEMORAL-RIGHT WITH PATCH GRAFT;  Surgeon: Sherren KernsFields, Charles E, MD;  [facility];  Service: Vascular;  Laterality: Right;  . IR NEPHROSTOMY PLACEMENT LEFT  06/12/2017  . IR NEPHROSTOMY PLACEMENT RIGHT  06/12/2017  . LOWER EXTREMITY ANGIOGRAPHY Right 10/25/2016   Procedure: Lower Extremity Angiography;  Surgeon: Sherren KernsFields, Charles E, MD;  [facility];  Service: Cardiovascular;  Laterality: Right;    Prior to Admission Meds: Medications Prior to Admission  Medication Sig Dispense Refill Last Dose  . acetaminophen (TYLENOL) 650 MG CR tablet Take 650 mg by mouth 3 (three) times daily.    06/10/2017 at Unknown time  .  aspirin EC 81 MG tablet Take 81 mg by mouth daily.   06/10/2017 at Unknown time  . atorvastatin (LIPITOR) 10 MG tablet Take 10 mg by mouth every evening.   06/10/2017 at Unknown time  . insulin glargine (LANTUS) 100 UNIT/ML injection Inject 40 Units into the skin at bedtime.    06/10/2017 at Unknown time  . lidocaine (LIDODERM) 5 % Place 1 patch onto the skin daily. Remove & Discard patch within 12 hours or as directed by MD   06/10/2017 at Unknown time  . LORazepam (ATIVAN) 1 MG tablet Take 1 mg by mouth 2 (two) times daily.   06/10/2017 at Unknown time  . Melatonin 10 MG TABS Take 10 mg by mouth at bedtime.   06/10/2017 at Unknown time  . metFORMIN (GLUCOPHAGE) 500 MG tablet Take 500 mg by mouth 2 (two) times daily with a meal.   06/10/2017 at Unknown time  . sertraline (ZOLOFT) 50 MG tablet Take 50 mg by mouth 2 (two) times daily.    06/10/2017 at Unknown time  . tamsulosin (FLOMAX) 0.4 MG CAPS capsule Take 0.4 mg by mouth every evening.   06/10/2017 at Unknown time  . traZODone (DESYREL) 50 MG tablet TAKE ONE TABLET (50 MG TOTAL) BY MOUTH AT BEDTIME.  2 06/10/2017 at Unknown time  . fluticasone (FLONASE) 50 MCG/ACT nasal spray Place 1 spray into the nose daily as needed for rhinitis.    Taking  . oxyCODONE-acetaminophen (PERCOCET/ROXICET) 5-325 MG tablet Take 1  tablet by mouth every 6 (six) hours as needed for moderate pain. (Patient not taking: Reported on 06/11/2017) 20 tablet 0 Not Taking at Unknown time    Inpatient Meds: @  Allergies  Allergen Reactions  . Penicillins Rash    Has patient had a PCN reaction causing immediate rash, facial/tongue/throat swelling, SOB or lightheadedness with hypotension: Yes Has patient had a PCN reaction causing severe rash involving mucus membranes or skin necrosis: No Has patient had a PCN reaction that required hospitalization No Has patient had a PCN reaction occurring within the last 10 years: No If all of the above answers are "NO", then  may proceed with Cephalosporin use.    Social History   Tobacco Use  . Smoking status: Former Smoker    Types: Cigarettes  . Smokeless tobacco: Never Used  . Tobacco comment: Quit smoking cigarettes 10/ 2017  Substance Use Topics  . Alcohol use: No    Comment: former   Family History  Problem Relation Age of Onset  . Diabetes Mother   . Heart disease Father   . Heart disease Brother     ROS: Other than ROS in the HPI, all other systems were negative.  Exam: Temp: 98.6 F (37 C) Pulse Rate: 73 BP: (!) 167/83 Resp: (!) 21 SpO2: 96 %  Visual Acuity:  near   OD 20/200   OS 20/200  *Difficult. Could not perform pinhole acuity. Does not have readers.    OD OS  Confr Vis Fields Full Full  EOM (Primary) Full Full  Lids/Lashes Normal Normal  Conjunctiva - Bulbar White, quiet White, quiet  Adnexa  Normal Normal  Pupils  3 --> 2, somewhat sluggish, no rAPD 3 --> 2, somewhat sluggish, no rAPD  Cornea  Clear Clear  Anterior Chamber Formed, grossly quiet Formed, grossly quiet  Lens:  2-3+ NS 2-3+ NS  IOP (tonopen) 12 10  Fundus - Dilated? YES   Optic Disc 0.4, pink, healthy appearance 0.4, pink, healthy appearance  Post Seg:  Retina                    Vessels Mild attenuation Mild attenuation                  Vitreous  Clear Clear                  Macula Normal Normal                  Periphery No holes or tears, no infiltrates or other abnormal findings No holes or tears, no infiltrates or other abnormal findings       Neuro:  Oriented to person, place, and time:  Yes Psychiatric:  Mood and Affect Appropriate:  Yes  Labs/imaging: Deferred  A/P:  71 y.o. male with fungemia  1) Fungemia - No ocular involvement. - Treatment per infectious disease.  2) Cataracts OU - Likely visually significant. - Recommend outpatient evaluation.  3) DM2 without retinopathy - Recommend more thorough exam as outpatient  Recommend routine outpatient follow up with his eye  doctor. If he does not have a regular ophthalmologist, I am happy to see him in my office.  R Fabian Sharp, MD  R Fabian Sharp, MD 06/15/2017, 11:49 AM

## 2017-06-15 NOTE — Progress Notes (Signed)
Referring Physician(s): Dr Raylene Miyamoto  Supervising Physician: Dr. Lowella Dandy  Patient Status:  [facility] - In-pt  Chief Complaint:  B Hydro Sepsis   Subjective:  12/20 procedure note: Pre-operative Diagnosis:  Sepsis and bilateral hydronephrosis from obstructive ureteral stones                     Post-operative Diagnosis: Same Indications: Sepsis Procedure: Bilateral nephrostomy tube placement Findings: Moderate left hydro.  Moderate-Severe right hydro.  Placement of bilateral nephrostomy tubes.  Blood tinged cloudy fluid removed from left kidney.   Bloody and mildly purulent fluid from right kidney.   Pt improving daily. Denies much pain.  Allergies: Penicillins  Medications:  Current Facility-Administered Medications:  .  acetaminophen (TYLENOL) tablet 1,000 mg, 1,000 mg, Oral, Q6H PRN, 1,000 mg at 06/14/17 2043 **OR** acetaminophen (TYLENOL) suppository 975 mg, 975 mg, Rectal, Q6H PRN, Ginger Carne, MD, 975 mg at 06/14/17 1433 .  anidulafungin (ERAXIS) 100 mg in sodium chloride 0.9 % 100 mL IVPB, 100 mg, Intravenous, Q24H, Ginnie Smart, MD, Stopped at 06/15/17 0101 .  diclofenac sodium (VOLTAREN) 1 % transdermal gel 2 g, 2 g, Topical, BID PRN, Lanelle Bal, MD, 2 g at 06/14/17 2043 .  heparin injection 5,000 Units, 5,000 Units, Subcutaneous, Q8H, Ralene Muskrat, PA-C, 5,000 Units at 06/15/17 0645 .  insulin aspart (novoLOG) injection 0-15 Units, 0-15 Units, Subcutaneous, TID WC, Helberg, Justin, MD .  insulin glargine (LANTUS) injection 30 Units, 30 Units, Subcutaneous, QHS, Camelia Phenes, DO, 30 Units at 06/14/17 2044 .  lidocaine (LIDODERM) 5 % 1 patch, 1 patch, Transdermal, Q24H, Lanelle Bal, MD, 1 patch at 06/14/17 1832 .  potassium chloride 10 mEq in 100 mL IVPB, 10 mEq, Intravenous, Q1 Hr x 6, Helberg, Justin, MD .  senna-docusate (Senokot-S) tablet 1 tablet, 1 tablet, Oral, QHS PRN, Hoffman, Jessica Ratliff, DO .  vancomycin (VANCOCIN) IVPB  1000 mg/200 mL premix, 1,000 mg, Intravenous, Q12H, Almon Hercules, RPH, Stopped at 06/15/17 0300    Vital Signs: BP (!) 167/83 (BP Location: Right Arm)   Pulse 73   Temp 98.6 F (37 C) (Oral)   Resp (!) 21   Ht 6' (1.829 m)   Wt 163 lb 9.3 oz (74.2 kg)   SpO2 96%   BMI 22.19 kg/m   Physical Exam  Constitutional: He appears well-developed. No distress.  HENT:  Mouth/Throat: Oropharynx is clear and moist.  Neck: Normal range of motion. No JVD present.  Cardiovascular: Normal rate, regular rhythm and normal heart sounds.  Pulmonary/Chest: Effort normal and breath sounds normal. No respiratory distress.  Abdominal: Soft. He exhibits no mass. There is no tenderness.  Neurological: He is alert.  Skin: Skin is warm and dry.  (B) PCN intact, sites are clean and dry Clear UOP from both  Vitals reviewed.   Imaging: Ct Abdomen Pelvis Wo Contrast  Result Date: 06/12/2017 CLINICAL DATA:  71 year old male admitted yesterday with sepsis and altered mental status. EXAM: CT CHEST, ABDOMEN AND PELVIS WITHOUT CONTRAST TECHNIQUE: Multidetector CT imaging of the chest, abdomen and pelvis was performed following the standard protocol without IV contrast. COMPARISON:  Portable chest 06/11/2017. FINDINGS: CT CHEST FINDINGS Cardiovascular: Vascular patency is not evaluated in the absence of IV contrast. Calcified coronary artery and descending thoracic aortic atherosclerosis. Cardiac size at the upper limits of normal. No pericardial effusion. Mediastinum/Nodes: Negative noncontrast thoracic inlet and mediastinum. No lymphadenopathy. Small volume retained secretions in the thoracic esophagus which is nondilated. Lungs/Pleura: Major  airways are patent. Respiratory motion artifact mostly in lower lobes. Upper lobe paraseptal emphysema. Posterior lower lobe and costophrenic angle subpleural opacity which could reflect a combination of atelectasis and chronic pulmonary interstitial changes such as mild  honeycombing. No pleural effusion. No consolidation. Musculoskeletal: Osteopenia. Mild chronic appearing T3 superior endplate compression. Chronic left posterior ninth rib fracture. No acute osseous abnormality identified. CT ABDOMEN PELVIS FINDINGS Hepatobiliary: Negative noncontrast liver and gallbladder. Pancreas: Negative. Spleen: Negative. Adrenals/Urinary Tract: Normal adrenal glands. Moderate to severe right and moderate left hydronephrosis. Associated bilateral extensive pararenal soft tissue stranding and mild expansion of the bilateral pararenal spaces. Thickening of the lateral Conal Foss show. Multiple punctate intrarenal calculi on the left. Questionable right midpole nephrolithiasis versus vascular calcification (coronal image 95). Associated bilateral hydroureter and periureteral stranding. The right ureter is abnormal to the level of a 5 x 8 mm calculus lodged just distal to the right ureteropelvic junction corresponding to the L3-L4 spinal level (series 3, image 87). Distal to this calculus there is persistent right periureteral stranding, but the right ureter is more decompressed. The right UVJ appears normal, however within the right posterior bladder there is an oval 3 x 6 mm calculus (series 3, image 121 and coronal image 98. The left ureter remains abnormal to the ureterovesical junction where an oval 5 x 7 mm calculus is demonstrated on coronal image 101. The urinary bladder is fairly decompressed. No perivesical stranding. Stomach/Bowel: Bulky stool ball in the rectum (sagittal image 102). Redundant sigmoid colon is mildly gas distended and tracks into the epigastrium. The descending colon is decompressed with some retained stool. Trace fluid in the left gutter appears probably related to the left renal space. Negative splenic flexure and transverse colon. Mildly redundant hepatic flexure. Decompressed ascending colon. Trace fluid in the right gutter appears probably related to the right  renal space. The right colon is redundant and the cecum is located on a lax mesentery in the right upper abdomen. Negative terminal ileum. Appendix is diminutive or absent. No dilated small bowel. Jejunum and ileum appear within normal limits. The duodenum appears mildly to moderately thickened and inflamed (coronal image 77), but this is felt to be secondary to the right renal findings. Negative stomach. No abdominal free air. Trace abdominal free fluid, primarily in both pericolic gutters. Vascular/Lymphatic: Aortoiliac calcified atherosclerosis. Vascular patency is not evaluated in the absence of IV contrast. Infrarenal abdominal aorta measures up to 25 mm diameter. No lymphadenopathy. Reproductive: Negative. Other: No pelvic free fluid. Musculoskeletal: Previously augmented L1 compression fracture. Osteopenia. Advanced L5-S1 disc degeneration. No acute osseous abnormality identified. IMPRESSION: 1. Moderate to severe bilateral Obstructive Uropathy. - 5 x 8 mm proximal Right ureter calculus about 2 cm distal to the right renal pelvis. - 5 x 7 mm distal Left ureter calculus just proximal to the UVJ. - a separate 6 mm calculus within the right urinary bladder probably is a recently passed stone. 2. Moderate to severe bilateral pararenal inflammatory changes. Consider Forniceal Rupture. 3. Secondary inflammatory involvement of the Duodenum, and small volume of free fluid in both pericolic gutters also thought to be secondary to the renal findings. 4. Mild pulmonary atelectasis possibly superimposed on chronic lower lobe interstitial disease. Mild Emphysema (ICD10-J43.9). 5. Bulky stool ball in the rectum.  Consider fecal impaction. 6. Aortic Atherosclerosis (ICD10-I70.0). Ectatic abdominal aorta at risk for aneurysm development. Recommend followup by ultrasound in 5 years. This recommendation follows ACR consensus guidelines: White Paper of the ACR Incidental Findings Committee II on  Vascular Findings. J Am Coll  Radiol 2013; 10:789-794. Electronically Signed   By: Odessa Fleming M.D.   On: 06/12/2017 11:40   Ct Head Wo Contrast  Result Date: 06/12/2017 CLINICAL DATA:  Altered mental status, sepsis EXAM: CT HEAD WITHOUT CONTRAST TECHNIQUE: Contiguous axial images were obtained from the base of the skull through the vertex without intravenous contrast. COMPARISON:  None. FINDINGS: Brain: No evidence of acute infarction, hemorrhage, extra-axial collection, ventriculomegaly, or mass effect. Right basal ganglia lacunar infarct. Generalized cerebral atrophy. Periventricular white matter low attenuation likely secondary to microangiopathy. Vascular: Cerebrovascular atherosclerotic calcifications are noted. Skull: Negative for fracture or focal lesion. Sinuses/Orbits: Visualized portions of the orbits are unremarkable. Visualized portions of the paranasal sinuses and mastoid air cells are unremarkable. Other: None. IMPRESSION: 1. No acute intracranial pathology. 2. Chronic microvascular disease and cerebral atrophy. Electronically Signed   By: Elige Ko   On: 06/12/2017 11:22   Ct Chest Wo Contrast  Result Date: 06/12/2017 CLINICAL DATA:  72 year old male admitted yesterday with sepsis and altered mental status. EXAM: CT CHEST, ABDOMEN AND PELVIS WITHOUT CONTRAST TECHNIQUE: Multidetector CT imaging of the chest, abdomen and pelvis was performed following the standard protocol without IV contrast. COMPARISON:  Portable chest 06/11/2017. FINDINGS: CT CHEST FINDINGS Cardiovascular: Vascular patency is not evaluated in the absence of IV contrast. Calcified coronary artery and descending thoracic aortic atherosclerosis. Cardiac size at the upper limits of normal. No pericardial effusion. Mediastinum/Nodes: Negative noncontrast thoracic inlet and mediastinum. No lymphadenopathy. Small volume retained secretions in the thoracic esophagus which is nondilated. Lungs/Pleura: Major airways are patent. Respiratory motion artifact mostly  in lower lobes. Upper lobe paraseptal emphysema. Posterior lower lobe and costophrenic angle subpleural opacity which could reflect a combination of atelectasis and chronic pulmonary interstitial changes such as mild honeycombing. No pleural effusion. No consolidation. Musculoskeletal: Osteopenia. Mild chronic appearing T3 superior endplate compression. Chronic left posterior ninth rib fracture. No acute osseous abnormality identified. CT ABDOMEN PELVIS FINDINGS Hepatobiliary: Negative noncontrast liver and gallbladder. Pancreas: Negative. Spleen: Negative. Adrenals/Urinary Tract: Normal adrenal glands. Moderate to severe right and moderate left hydronephrosis. Associated bilateral extensive pararenal soft tissue stranding and mild expansion of the bilateral pararenal spaces. Thickening of the lateral Conal Foss show. Multiple punctate intrarenal calculi on the left. Questionable right midpole nephrolithiasis versus vascular calcification (coronal image 95). Associated bilateral hydroureter and periureteral stranding. The right ureter is abnormal to the level of a 5 x 8 mm calculus lodged just distal to the right ureteropelvic junction corresponding to the L3-L4 spinal level (series 3, image 87). Distal to this calculus there is persistent right periureteral stranding, but the right ureter is more decompressed. The right UVJ appears normal, however within the right posterior bladder there is an oval 3 x 6 mm calculus (series 3, image 121 and coronal image 98. The left ureter remains abnormal to the ureterovesical junction where an oval 5 x 7 mm calculus is demonstrated on coronal image 101. The urinary bladder is fairly decompressed. No perivesical stranding. Stomach/Bowel: Bulky stool ball in the rectum (sagittal image 102). Redundant sigmoid colon is mildly gas distended and tracks into the epigastrium. The descending colon is decompressed with some retained stool. Trace fluid in the left gutter appears probably  related to the left renal space. Negative splenic flexure and transverse colon. Mildly redundant hepatic flexure. Decompressed ascending colon. Trace fluid in the right gutter appears probably related to the right renal space. The right colon is redundant and the cecum is located on  a lax mesentery in the right upper abdomen. Negative terminal ileum. Appendix is diminutive or absent. No dilated small bowel. Jejunum and ileum appear within normal limits. The duodenum appears mildly to moderately thickened and inflamed (coronal image 77), but this is felt to be secondary to the right renal findings. Negative stomach. No abdominal free air. Trace abdominal free fluid, primarily in both pericolic gutters. Vascular/Lymphatic: Aortoiliac calcified atherosclerosis. Vascular patency is not evaluated in the absence of IV contrast. Infrarenal abdominal aorta measures up to 25 mm diameter. No lymphadenopathy. Reproductive: Negative. Other: No pelvic free fluid. Musculoskeletal: Previously augmented L1 compression fracture. Osteopenia. Advanced L5-S1 disc degeneration. No acute osseous abnormality identified. IMPRESSION: 1. Moderate to severe bilateral Obstructive Uropathy. - 5 x 8 mm proximal Right ureter calculus about 2 cm distal to the right renal pelvis. - 5 x 7 mm distal Left ureter calculus just proximal to the UVJ. - a separate 6 mm calculus within the right urinary bladder probably is a recently passed stone. 2. Moderate to severe bilateral pararenal inflammatory changes. Consider Forniceal Rupture. 3. Secondary inflammatory involvement of the Duodenum, and small volume of free fluid in both pericolic gutters also thought to be secondary to the renal findings. 4. Mild pulmonary atelectasis possibly superimposed on chronic lower lobe interstitial disease. Mild Emphysema (ICD10-J43.9). 5. Bulky stool ball in the rectum.  Consider fecal impaction. 6. Aortic Atherosclerosis (ICD10-I70.0). Ectatic abdominal aorta at risk  for aneurysm development. Recommend followup by ultrasound in 5 years. This recommendation follows ACR consensus guidelines: White Paper of the ACR Incidental Findings Committee II on Vascular Findings. J Am Coll Radiol 2013; 10:789-794. Electronically Signed   By: Odessa Fleming M.D.   On: 06/12/2017 11:40   Dg Chest Portable 1 View  Result Date: 06/11/2017 CLINICAL DATA:  Sepsis EXAM: PORTABLE CHEST 1 VIEW COMPARISON:  None. FINDINGS: Hypoventilation with bibasilar atelectasis. Negative for heart failure or pneumonia. Heart size within normal limits. No significant pleural effusion Advanced degenerative change left shoulder IMPRESSION: Mild bibasilar atelectasis.  Negative for pneumonia. Electronically Signed   By: Marlan Palau M.D.   On: 06/11/2017 11:07   Ir Nephrostomy Placement Left  Result Date: 06/12/2017 INDICATION: 71 year old with sepsis and bilateral obstructing ureter calculi. Patient needs urgent renal decompression. EXAM: PLACEMENT OF LEFT PERCUTANEOUS NEPHROSTOMY TUBE WITH ULTRASOUND AND FLUOROSCOPIC GUIDANCE PLACEMENT OF RIGHT PERCUTANEOUS NEPHROSTOMY TUBE WITH ULTRASOUND AND FLUOROSCOPIC GUIDANCE COMPARISON:  None. MEDICATIONS: Ciprofloxacin 400 mg; The antibiotic was administered in an appropriate time frame prior to skin puncture. ANESTHESIA/SEDATION: Fentanyl 25 mcg IV; Versed 1.0 mg IV Moderate Sedation Time:  30 minutes The patient was continuously monitored during the procedure by the interventional radiology nurse under my direct supervision. CONTRAST:  15 mL - administered into the collecting system(s) FLUOROSCOPY TIME:  Fluoroscopy Time: 2 minutes 18 seconds (8 mGy). COMPLICATIONS: None immediate. PROCEDURE: Informed written consent was obtained from the patient after a thorough discussion of the procedural risks, benefits and alternatives. All questions were addressed. Maximal Sterile Barrier Technique was utilized including caps, mask, sterile gowns, sterile gloves, sterile drape,  hand hygiene and skin antiseptic. A timeout was performed prior to the initiation of the procedure. Patient was placed prone. Both flanks were prepped and draped in a sterile fashion. Left flank was anesthetized with 1% lidocaine. 22 gauge Chiba needle was dilated into a mid/ lower pole calyx with ultrasound guidance. Wire was easily advanced in the renal collecting system. Accustick dilator set was placed. Tract was dilated to accommodate a 10.2 [place]  multipurpose drain. Blood tinged urine was collected and sent for culture. Small amount of contrast confirmed placement in the renal collecting system. Catheter was sutured to skin. Right flank was anesthetized with 1% lidocaine. Using ultrasound guidance, 22 gauge needle was directed into a mid/ lower pole calyx. Small amount of purulent fluid was draining from the needle. 0.018 wire was advanced to the renal pelvis. Accustick dilator set was placed and the tract was dilated to accommodate a 10.2 [place] multipurpose drain. Bloody fluid was aspirated and sent for culture. Small amount of contrast confirmed placement in the renal collecting system. Catheter was sutured to skin. Both tubes were connected to gravity bags. Fluoroscopic and ultrasound images were taken and saved for documentation. FINDINGS: Moderate left hydronephrosis. Left nephrostomy tube is well positioned in the left renal pelvis. Moderate-severe right hydronephrosis. Obstructing stone in the proximal right ureter. Nephrostomy tube is reconstituted in the right renal pelvis. Bloody fluid was removed from the right renal collecting system. IMPRESSION: Successful bilateral percutaneous nephrostomy tubes using ultrasound and fluoroscopic guidance. Bilateral nephrostomy tube samples were sent for culture. Electronically Signed   By: Richarda OverlieAdam  Henn M.D.   On: 06/12/2017 17:50   Ir Nephrostomy Placement Right  Result Date: 06/12/2017 INDICATION: 71 year old with sepsis and bilateral obstructing ureter  calculi. Patient needs urgent renal decompression. EXAM: PLACEMENT OF LEFT PERCUTANEOUS NEPHROSTOMY TUBE WITH ULTRASOUND AND FLUOROSCOPIC GUIDANCE PLACEMENT OF RIGHT PERCUTANEOUS NEPHROSTOMY TUBE WITH ULTRASOUND AND FLUOROSCOPIC GUIDANCE COMPARISON:  None. MEDICATIONS: Ciprofloxacin 400 mg; The antibiotic was administered in an appropriate time frame prior to skin puncture. ANESTHESIA/SEDATION: Fentanyl 25 mcg IV; Versed 1.0 mg IV Moderate Sedation Time:  30 minutes The patient was continuously monitored during the procedure by the interventional radiology nurse under my direct supervision. CONTRAST:  15 mL - administered into the collecting system(s) FLUOROSCOPY TIME:  Fluoroscopy Time: 2 minutes 18 seconds (8 mGy). COMPLICATIONS: None immediate. PROCEDURE: Informed written consent was obtained from the patient after a thorough discussion of the procedural risks, benefits and alternatives. All questions were addressed. Maximal Sterile Barrier Technique was utilized including caps, mask, sterile gowns, sterile gloves, sterile drape, hand hygiene and skin antiseptic. A timeout was performed prior to the initiation of the procedure. Patient was placed prone. Both flanks were prepped and draped in a sterile fashion. Left flank was anesthetized with 1% lidocaine. 22 gauge Chiba needle was dilated into a mid/ lower pole calyx with ultrasound guidance. Wire was easily advanced in the renal collecting system. Accustick dilator set was placed. Tract was dilated to accommodate a 10.2 [place] multipurpose drain. Blood tinged urine was collected and sent for culture. Small amount of contrast confirmed placement in the renal collecting system. Catheter was sutured to skin. Right flank was anesthetized with 1% lidocaine. Using ultrasound guidance, 22 gauge needle was directed into a mid/ lower pole calyx. Small amount of purulent fluid was draining from the needle. 0.018 wire was advanced to the renal pelvis. Accustick dilator  set was placed and the tract was dilated to accommodate a 10.2 [place] multipurpose drain. Bloody fluid was aspirated and sent for culture. Small amount of contrast confirmed placement in the renal collecting system. Catheter was sutured to skin. Both tubes were connected to gravity bags. Fluoroscopic and ultrasound images were taken and saved for documentation. FINDINGS: Moderate left hydronephrosis. Left nephrostomy tube is well positioned in the left renal pelvis. Moderate-severe right hydronephrosis. Obstructing stone in the proximal right ureter. Nephrostomy tube is reconstituted in the right renal pelvis. Bloody fluid was  removed from the right renal collecting system. IMPRESSION: Successful bilateral percutaneous nephrostomy tubes using ultrasound and fluoroscopic guidance. Bilateral nephrostomy tube samples were sent for culture. Electronically Signed   By: Richarda OverlieAdam  Henn M.D.   On: 06/12/2017 17:50    Labs:  CBC: Recent Labs    06/12/17 0539 06/13/17 0038 06/14/17 0224 06/15/17 0211  WBC 14.0* 12.8* 10.6* 9.4  HGB 12.2* 12.4* 12.8* 11.1*  HCT 37.9* 39.5 40.0 35.7*  PLT 216 211 182 206    COAGS: Recent Labs    10/30/16 0851 06/11/17 1042  INR 1.03 1.08  APTT 28  --     BMP: Recent Labs    06/12/17 1450 06/13/17 0038 06/14/17 0224 06/15/17 0211  NA 144 146* 146* 145  K 3.5 3.6 3.4* 3.2*  CL 114* 114* 114* 111  CO2 19* 21* 25 30  GLUCOSE 282* 308* 261* 318*  BUN 51* 44* 36* 23*  CALCIUM 7.9* 8.3* 8.0* 7.9*  CREATININE 2.64* 2.29* 1.14 0.95  GFRNONAA 23* 27* >60 >60  GFRAA 27* 32* >60 >60    LIVER FUNCTION TESTS: Recent Labs    10/30/16 0851 06/11/17 1042  BILITOT 0.3 1.0  AST 19 27  ALT 19 20  ALKPHOS 76 69  PROT 6.9 8.7*  ALBUMIN 3.5 3.6    Assessment and Plan: Sepsis, resolving B hydro from obstructing ureteral stones B PCNs placed in IR 12/20 Clear UOP, WBC normal, Cr normal, afebrile Per Urology and Medicine team notes, plan for attempt at  internalization to ureteral stents. Concern pt will pull PCNs out due to confusion. Risks and benefits of nephrostograms and internalization to ureteral stents were discussed with the patient and his wife including, but not limited to, infection, bleeding, failure to place stents which would result in pt keeping external PCN  All questions were answered, patient is agreeable to proceed.  Consent signed and in chart.    Electronically Signed: Brayton ElBRUNING, Taeden Geller, PA-C 06/15/2017, 9:46 AM   I spent a total of 15 Minutes at the the patient's bedside AND on the patient's hospital floor or unit, greater than 50% of which was counseling/coordinating care for B PCN conversion to ureteral stents.

## 2017-06-16 DIAGNOSIS — R109 Unspecified abdominal pain: Secondary | ICD-10-CM

## 2017-06-16 DIAGNOSIS — IMO0002 Reserved for concepts with insufficient information to code with codable children: Secondary | ICD-10-CM

## 2017-06-16 LAB — BASIC METABOLIC PANEL
Anion gap: 6 (ref 5–15)
BUN: 18 mg/dL (ref 6–20)
CALCIUM: 7.9 mg/dL — AB (ref 8.9–10.3)
CO2: 30 mmol/L (ref 22–32)
Chloride: 107 mmol/L (ref 101–111)
Creatinine, Ser: 0.82 mg/dL (ref 0.61–1.24)
GFR calc Af Amer: >60 mL/min (ref >60)
GLUCOSE: 276 mg/dL — AB (ref 65–99)
POTASSIUM: 3.1 mmol/L — AB (ref 3.5–5.1)
SODIUM: 143 mmol/L (ref 135–145)

## 2017-06-16 LAB — GLUCOSE, CAPILLARY
GLUCOSE-CAPILLARY: 219 mg/dL — AB (ref 65–99)
GLUCOSE-CAPILLARY: 277 mg/dL — AB (ref 65–99)
Glucose-Capillary: 153 mg/dL — ABNORMAL HIGH (ref 65–99)
Glucose-Capillary: 255 mg/dL — ABNORMAL HIGH (ref 65–99)

## 2017-06-16 LAB — CBC
HCT: 33.6 % — ABNORMAL LOW (ref 39.0–52.0)
Hemoglobin: 10.5 g/dL — ABNORMAL LOW (ref 13.0–17.0)
MCH: 29.1 pg (ref 26.0–34.0)
MCHC: 31.3 g/dL (ref 30.0–36.0)
MCV: 93.1 fL (ref 78.0–100.0)
Platelets: 205 10*3/uL (ref 150–400)
RBC: 3.61 MIL/uL — ABNORMAL LOW (ref 4.22–5.81)
RDW: 12.8 % (ref 11.5–15.5)
WBC: 9.1 10*3/uL (ref 4.0–10.5)

## 2017-06-16 LAB — MAGNESIUM: MAGNESIUM: 1.7 mg/dL (ref 1.7–2.4)

## 2017-06-16 MED ORDER — INSULIN GLARGINE 100 UNIT/ML ~~LOC~~ SOLN
36.0000 [IU] | Freq: Every day | SUBCUTANEOUS | Status: DC
  Administered 2017-06-16: 36 [IU] via SUBCUTANEOUS
  Filled 2017-06-16 (×2): qty 0.36

## 2017-06-16 MED ORDER — HEPARIN SODIUM (PORCINE) 5000 UNIT/ML IJ SOLN: 5000.0000 [IU] | Freq: Three times a day (TID) | INTRAMUSCULAR | Status: DC

## 2017-06-16 MED ORDER — LOPERAMIDE HCL 2 MG PO CAPS
4.0000 mg | ORAL_CAPSULE | ORAL | Status: DC | PRN
  Administered 2017-06-16: 4 mg via ORAL
  Filled 2017-06-16: qty 2

## 2017-06-16 MED ORDER — POTASSIUM CHLORIDE IN NACL 40-0.9 MEQ/L-% IV SOLN
INTRAVENOUS | Status: AC
  Administered 2017-06-16 – 2017-06-19 (×5): 100 mL/h via INTRAVENOUS
  Filled 2017-06-16 (×5): qty 1000

## 2017-06-16 MED ORDER — CYCLOBENZAPRINE HCL 10 MG PO TABS: 5.0000 mg | ORAL_TABLET | Freq: Every evening | ORAL | Status: DC | PRN

## 2017-06-16 MED ORDER — LOPERAMIDE HCL 2 MG PO CAPS: 4.0000 mg | ORAL_CAPSULE | Freq: Four times a day (QID) | ORAL | Status: DC | PRN

## 2017-06-16 NOTE — Progress Notes (Signed)
  Date: 06/16/2017  Patient name: Alan SeedsGlenn A Rimel Jr.  Medical record number: [id number]  Date of birth: Oct 26, 1945   I have seen and evaluated this patient and I have discussed the plan of care with the house staff. Please see Dr. Reginal LutesHelberg's note for complete details. I concur with his findings.    [name], Ryhanna Dunsmore B, MD 06/16/2017, 1:01 PM

## 2017-06-16 NOTE — Progress Notes (Addendum)
   Subjective: Doing well this AM. His right shoulder pain is improved but he is having some abdominal pain. Wife at bedside states that he has periods where he has frequent loose bowel then periods where he is regular. Has not noticed any dietary correlation but has noticed it is worse when is diabetes is uncontrolled. He is otherwise doing well this AM. Discussed the plan to way until cultures return before proceeding with internalization of his nephrostomy tubes. They agree with the plan. All questions and concerns addressed.   Objective: Vital signs in last 24 hours: Vitals:   06/15/17 0856 06/15/17 2001 06/16/17 0009 06/16/17 0400  BP: (!) 167/83 135/76 138/68 (!) 157/70  Pulse:  91 75 66  Resp:  20 18 16   Temp: 98.6 F (37 C) 98.6 F (37 C) 98.2 F (36.8 C) 98.3 F (36.8 C)  TempSrc: Oral Oral Axillary Axillary  SpO2: 96% 98% 96% 96%  Weight:      Height:       General: Thin male in no acute distress Pulm: Good air movement with no wheezing or crackles  CV: RRR, no murmurs, no rubs  Abdomen: Active bowel sounds, soft, non-distended, no tenderness to palpation, nephrostomy tubes in place with some stones/deposits in the left nephrostomy bag and some pink tinge in the right nephrostomy bag  Extremities: Warm and dry, no right LE edema   Assessment/Plan:  MRSA Bacteremia and Fungemia (Candida Tropicalis)  - Patient continues to improve and has been afebrile for >24 hours - Patient continues to have good urine output through his nephrostomy tubes. IR is planning to internalize his nephrostomy tubes. We have discussed waiting until the patient's blood cultures return negative before proceeding.   - Urology is following and will treat obstructive nephrolithiasis once infection is resolved. He will follow-up as an outpatient  - Transthoracic echocardiogram on 12/21 illustrating no signs of endocarditis - Opthalmology evaluation illustrating no ocular involvement, recommending  outpatient follow-up for OU cataract and diabetic eye exam  - Repeat blood cultures for 12/23 pending  - ID onboard, recommend continuing Vancomycin and Eraxis for 14 days from sterile cultures.   Type 2 DM  - Glucose continues to be elevated - Currently: CBG q4 with M-SSI, meal coverage and lantus 30U QHS - Will increase basal insulin to 36 units QHS  Hypokalemia  - Continues to be hypokalemic with normal Mg - Continue to replete with IV potassium +NS - Likely due to the frequent BMs. Right now do not think his frequent BMs are due to infection but more likely due to antibiotics exacerbating patient's normal bowel function. Loperamide added to decrease frequency   Normocytic Anemia  - Hgb 10.5 today, stable compared to prior. RDW is within normal limits  - Will continue to monitor. No further evaluation at this point.   AKI. Resolved Prolonged QTc. Resolved   Dispo: Anticipated discharge in approximately >1 day(s).   Levora DredgeHelberg, Kymia Simi, MD 06/16/2017, 6:11 AM My Pager: [phone]

## 2017-06-16 NOTE — Progress Notes (Signed)
Patient ID: Alan SeedsGlenn A Domzalski Jr., male   DOB: 1946/03/17, 71 y.o.   MRN: [id number]          [facility]    Date of Admission:  06/11/2017   Day 6 vancomycin        Day 3 anidulafungin  Mr. Alan [name] is now afebrile.  Blood cultures on admission grew methicillin-resistant coagulase-negative staph.  Repeat blood cultures 48 hours later on 06/13/2017 grew Candida tropicalis.  Repeat blood cultures done yesterday are negative at 24 hours.  I will continue current antimicrobial therapy.  Eye exam did not reveal any evidence of candidal endophthalmitis.  He will need 2 weeks of antimicrobial therapy following first set of negative blood cultures.  I will follow-up on 06/18/2017.         Cliffton AstersJohn Danzell Birky, MD [facility] [phone] pager   [phone] cell 06/16/2017, 4:03 PM

## 2017-06-16 NOTE — Progress Notes (Signed)
Physical Therapy Treatment Patient Details Name: Alan SeedsGlenn A Templeman Jr. MRN: [id number] DOB: [birth year] Today's Date: 06/16/2017    History of Present Illness pt is a 71 y/o male with pmh significant for DM, CAD, dementia, CVA, PAD s/p L BKA and R transmet amputation and alcohol use d/o , presenting with sepsis.  Work up findings includ bacteremia due to ascending obstructive pyelonephritis, s/p nephrostomy tubes 12/20.    PT Comments    Pt performed increased sitting tolerance edge of bed but remains to require max assist to support balance.  Plan for SNF remains appropriate at this time.  Pt slowly progressing due to cognition.    Follow Up Recommendations  SNF;Supervision/Assistance - 24 hour     Equipment Recommendations  None recommended by PT;Other (comment)(TBA at next venue.  )    Recommendations for Other Services       Precautions / Restrictions Precautions Precautions: Fall Precaution Comments: drainage tubes; at risk for skin breakdown Restrictions Other Position/Activity Restrictions: per wife L wrist is fractured.  no order in chart    Mobility  Bed Mobility Overal bed mobility: Needs Assistance Bed Mobility: Supine to Sit;Sit to Supine     Supine to sit: Max assist;+2 for safety/equipment Sit to supine: Total assist;+2 for physical assistance   General bed mobility comments: Pt able to initiate movement with B LEs to edge of bed increased assistance to elevate trunk into sitting.  Pt sat edge of bed x 10 min with emphasis on posture and head control,  Balance remains poor to zero.    Transfers Overall transfer level: (deferred as sitting balance remains zero.  )                  Ambulation/Gait                 Stairs            Wheelchair Mobility    Modified Rankin (Stroke Patients Only)       Balance Overall balance assessment: Needs assistance   Sitting balance-Leahy Scale: Zero Sitting balance - Comments: L lateral lean     Standing balance-Leahy Scale: Zero                              Cognition Arousal/Alertness: Awake/alert Behavior During Therapy: Flat affect Overall Cognitive Status: Impaired/Different from baseline Area of Impairment: Orientation;Attention;Memory;Following commands;Safety/judgement;Awareness;Problem solving                 Orientation Level: Disoriented to;Time;Place;Situation Current Attention Level: Sustained Memory: Decreased recall of precautions;Decreased short-term memory Following Commands: Follows one step commands inconsistently Safety/Judgement: Decreased awareness of safety;Decreased awareness of deficits Awareness: Intellectual Problem Solving: Slow processing;Decreased initiation;Difficulty sequencing General Comments: Pt has difficulty maintaining focus on task which hinders progression with tx.        Exercises  x3 heel slides on R before patient not following commands.      General Comments        Pertinent Vitals/Pain Pain Assessment: No/denies pain    Home Living                      Prior Function            PT Goals (current goals can now be found in the care plan section) Acute Rehab PT Goals Patient Stated Goal: wifes goal is for pt to return home wiht her care Potential to Achieve Goals:  Fair Progress towards PT goals: Not progressing toward goals - comment(unclear if limited due to ability or cognition.  )    Frequency    Min 3X/week      PT Plan Current plan remains appropriate    Co-evaluation              AM-PAC PT "6 Clicks" Daily Activity  Outcome Measure  Difficulty turning over in bed (including adjusting bedclothes, sheets and blankets)?: Unable Difficulty moving from lying on back to sitting on the side of the bed? : Unable Difficulty sitting down on and standing up from a chair with arms (e.g., wheelchair, bedside commode, etc,.)?: Unable Help needed moving to and from a bed to chair  (including a wheelchair)?: Total Help needed walking in hospital room?: Total Help needed climbing 3-5 steps with a railing? : Total 6 Click Score: 6    End of Session Equipment Utilized During Treatment: Gait belt Activity Tolerance: Patient limited by fatigue Patient left: in bed;with call bell/phone within reach;with bed alarm set Nurse Communication: Mobility status PT Visit Diagnosis: Muscle weakness (generalized) (M62.81);Other symptoms and signs involving the nervous system (R29.898);Other abnormalities of gait and mobility (R26.89)     Time: [id number] PT Time Calculation (min) (ACUTE ONLY): 27 min  Charges:  $Therapeutic Activity: 23-37 mins                    G CodesJoycelyn Rua:       Marifer Hurd, PTA pager [phone]    Florestine Aversimee J Sylva Overley 06/16/2017, 1:00 PM

## 2017-06-16 NOTE — Progress Notes (Signed)
Patient ID: Alan SeedsGlenn A Renaldo Jr., male   DOB: 03-Sep-1945, 71 y.o.   MRN: [id number]   Was scheduled for Internalization of PCNs in IR today  MD asking to await all Cx results  Will reschedule to 12/26 NPO made for that date Hep inj held 12/26 for procedure

## 2017-06-16 NOTE — Progress Notes (Signed)
SLP Cancellation Note  Patient Details Name: Alan SeedsGlenn A Rome Jr. MRN: [id number] DOB: 1945/10/28   Cancelled treatment:       Reason Eval/Treat Not Completed: Patient at procedure or test/unavailable. Pt is NPO for procedure. Will continue to follow to implement swallow strategies when pt is available.    Tiffanni Scarfo, Riley NearingBonnie Caroline 06/16/2017, 7:00 AM

## 2017-06-16 NOTE — Clinical Social Work Note (Signed)
Clinical Social Work Assessment  Patient Details  Name: Alan [name]. MRN: [id number] Date of Birth: August 30, 1945  Date of referral:  06/15/17               Reason for consult:  Facility Placement, Discharge Planning                Permission sought to share information with:  Family Supports Permission granted to share information::  Yes, Verbal Permission Granted  Name::     Edoardo Laforte   Agency::  snf  Relationship::  spouse  Contact Information:  [phone]  Housing/Transportation Living arrangements for the past 2 months:  Single Family Home Source of Information:  Spouse Patient Interpreter Needed:  None Criminal Activity/Legal Involvement Pertinent to Current Situation/Hospitalization:  No - Comment as needed Significant Relationships:  Adult Children, Spouse Lives with:  Spouse Do you feel safe going back to the place where you live?  No Need for family participation in patient care:  Yes (Comment)  Care giving concerns:  Patient's wife at bedside. CSW did assessment with wife because patient was not cognitive x 4   Social Worker assessment / plan:  CSW met patients wife Morey Hummingbird outside patients door. Carrier stated that it is just the two of them in the home and she does not think that she will be abel to take care of patient in his condition. Morey Hummingbird stated she is agreeable to discharge patient to a SNF but would prefer facility be close to home. CSW explained to family that due to being under [facility] patient would have to discharge to a facility with contract with [facility]. CSW to follow up with patient on bed choices. CSW contacted [facility] to make them aware patient is in the hospital.    Employment status:  Retired Forensic scientist:  Other (Comment Required)([facility]) PT Recommendations:  [place] / Referral to community resources:  [place]  Patient/Family's Response to care:  Morey Hummingbird  seemed overwhelmed and guilty about sending patient to facility. Carrier stated he will be better at SNF for short term rehab because she is aware she is unable to care for him.  Patient/Family's Understanding of and Emotional Response to Diagnosis, Current Treatment, and Prognosis:  CSW stated to Carrier that if she needed any other resources for support that CSW would be available.  Emotional Assessment Appearance:  Appears older than stated age Attitude/Demeanor/Rapport:  Other Affect (typically observed):  Unable to Assess Orientation:  Oriented to Place, Oriented to Self Alcohol / Substance use:  Not Applicable Psych involvement (Current and /or in the community):  No (Comment)  Discharge Needs  Concerns to be addressed:  No discharge needs identified Readmission within the last 30 days:  No Current discharge risk:  None Barriers to Discharge:  No Barriers Identified   Wende Neighbors, LCSW 06/16/2017, 10:42 AM

## 2017-06-17 LAB — BASIC METABOLIC PANEL
Anion gap: 9 (ref 5–15)
BUN: 21 mg/dL — AB (ref 6–20)
CO2: 29 mmol/L (ref 22–32)
CREATININE: 0.89 mg/dL (ref 0.61–1.24)
Calcium: 7.9 mg/dL — ABNORMAL LOW (ref 8.9–10.3)
Chloride: 103 mmol/L (ref 101–111)
Glucose, Bld: 233 mg/dL — ABNORMAL HIGH (ref 65–99)
POTASSIUM: 3.2 mmol/L — AB (ref 3.5–5.1)
SODIUM: 141 mmol/L (ref 135–145)

## 2017-06-17 LAB — CBC
HCT: 33.5 % — ABNORMAL LOW (ref 39.0–52.0)
Hemoglobin: 10.4 g/dL — ABNORMAL LOW (ref 13.0–17.0)
MCH: 29.2 pg (ref 26.0–34.0)
MCHC: 31 g/dL (ref 30.0–36.0)
MCV: 94.1 fL (ref 78.0–100.0)
PLATELETS: 230 10*3/uL (ref 150–400)
RBC: 3.56 MIL/uL — AB (ref 4.22–5.81)
RDW: 12.9 % (ref 11.5–15.5)
WBC: 9.1 10*3/uL (ref 4.0–10.5)

## 2017-06-17 LAB — GLUCOSE, CAPILLARY
GLUCOSE-CAPILLARY: 217 mg/dL — AB (ref 65–99)
GLUCOSE-CAPILLARY: 266 mg/dL — AB (ref 65–99)
Glucose-Capillary: 198 mg/dL — ABNORMAL HIGH (ref 65–99)
Glucose-Capillary: 279 mg/dL — ABNORMAL HIGH (ref 65–99)

## 2017-06-17 LAB — VANCOMYCIN, TROUGH: VANCOMYCIN TR: 23 ug/mL — AB (ref 15–20)

## 2017-06-17 MED ORDER — SODIUM CHLORIDE 0.9% FLUSH
10.0000 mL | INTRAVENOUS | Status: DC | PRN
  Administered 2017-06-18: 10 mL
  Filled 2017-06-17: qty 40

## 2017-06-17 MED ORDER — INSULIN GLARGINE 100 UNIT/ML ~~LOC~~ SOLN
40.0000 [IU] | Freq: Every day | SUBCUTANEOUS | Status: DC
  Administered 2017-06-17 – 2017-06-22 (×6): 40 [IU] via SUBCUTANEOUS
  Filled 2017-06-17 (×7): qty 0.4

## 2017-06-17 MED ORDER — HEPARIN SODIUM (PORCINE) 5000 UNIT/ML IJ SOLN
5000.0000 [IU] | Freq: Three times a day (TID) | INTRAMUSCULAR | Status: DC
  Administered 2017-06-17 – 2017-06-19 (×5): 5000 [IU] via SUBCUTANEOUS
  Filled 2017-06-17 (×4): qty 1

## 2017-06-17 NOTE — Progress Notes (Signed)
Spoke with Dr Orvan Falconerampbell via telephone, states ok to place PICC at this time.

## 2017-06-17 NOTE — Progress Notes (Signed)
  Date: 06/17/2017  Patient name: Margaretha SeedsGlenn A Stanbery Jr.  Medical record number: [id number]  Date of birth: 02-May-1946   This patient's plan of care was discussed with the house staff. Please see Dr. Reginal LutesHelberg's note for complete details. I concur with his findings.  Please note patient has Coag Negative staph which is methicillin resistant, not MRSA.    [name], Darrow Barreiro B, MD 06/17/2017, 2:41 PM

## 2017-06-17 NOTE — Progress Notes (Addendum)
Pharmacy Antibiotic Note  Margaretha SeedsGlenn A Witucki Jr. is a 71 y.o. male with CONS and candida tropicalis bacteremia and sepsis.  Pharmacy has been consulted for Fluconazole (day #4) and vancomycin dosing (day #7). Afebrile, WBC wnl.  AKI on admit, now resolved. Vancomycin dose was adjusted down for trough of 22 (prior to 3rd dose, not at steady state) on 12/21. Given significant improvement in renal function and bacteremia, dose was empirically increased on 12/22 to 1g IV q12h.   12/25 VT: 23 on 1g q12h (11hr trough > true trough ~21)  Plan: -Continue vancomycin 1 gram IV q12h -Continue fluconazole 200 mg IV q24h -Monitor clinical progress, c/s, renal function, vancomycin trough at steady state -F/u ID recommendations  Height: 6' (182.9 cm) Weight: 163 lb 9.3 oz (74.2 kg) IBW/kg (Calculated) : 77.6  Temp (24hrs), Avg:98.4 F (36.9 C), Min:97 F (36.1 C), Max:100 F (37.8 C)  Recent Labs  Lab 06/11/17 1036  06/11/17 1147 06/11/17 1421  06/12/17 1135 06/12/17 1305  06/13/17 0038 06/14/17 0224 06/15/17 0211 06/16/17 0416 06/17/17 0331  WBC  --    < >  --   --    < >  --   --   --  12.8* 10.6* 9.4 9.1 9.1  CREATININE 1.40*   < >  --   --    < >  --   --    < > 2.29* 1.14 0.95 0.82 0.89  LATICACIDVEN 3.51*  --  2.51* 1.64  --  1.2 1.2  --   --   --   --   --   --   VANCOTROUGH  --   --   --   --   --   --   --   --  22*  --   --   --   --    < > = values in this interval not displayed.    Estimated Creatinine Clearance: 82.2 mL/min (by C-G formula based on SCr of 0.89 mg/dL).    Allergies  Allergen Reactions  . Penicillins Rash    Has patient had a PCN reaction causing immediate rash, facial/tongue/throat swelling, SOB or lightheadedness with hypotension: Yes Has patient had a PCN reaction causing severe rash involving mucus membranes or skin necrosis: No Has patient had a PCN reaction that required hospitalization No Has patient had a PCN reaction occurring within the last 10  years: No If all of the above answers are "NO", then may proceed with Cephalosporin use.     Ladell PierBrooke Eleisha Branscomb, PharmD Pharmacy Resident [phone] 06/17/2017 10:53 AM

## 2017-06-17 NOTE — Progress Notes (Signed)
Peripherally Inserted Central Catheter/Midline Placement  The IV Nurse has discussed with the patient and/or persons authorized to consent for the patient, the purpose of this procedure and the potential benefits and risks involved with this procedure.  The benefits include less needle sticks, lab draws from the catheter, and the patient may be discharged home with the catheter. Risks include, but not limited to, infection, bleeding, blood clot (thrombus formation), and puncture of an artery; nerve damage and irregular heartbeat and possibility to perform a PICC exchange if needed/ordered by physician.  Alternatives to this procedure were also discussed.  Bard Power PICC patient education guide, fact sheet on infection prevention and patient information card has been provided to patient /or left at bedside.    PICC/Midline Placement Documentation     Consent obtained by wife at bedside.   Franne Gripewman, Traci Gafford Renee 06/17/2017, 3:13 PM

## 2017-06-17 NOTE — Progress Notes (Addendum)
   Subjective: Wife not at bedside this AM. Patient states that he is doing well. Right shoulder pain continues to improve. He denies abdominal pain and thinks his BMs are slowing down. Asking when he can leave. He no longer wants to be 'incarcerated.' Otherwise doing well with no questions or concerns. Discussed the plan to continue antibiotic and antifungal therapy. Cultures still pending.   Objective: Vital signs in last 24 hours: Vitals:   06/16/17 1639 06/16/17 2028 06/17/17 0040 06/17/17 0400  BP: 125/72 124/67 124/73 (!) 142/72  Pulse:  87 74 72  Resp:  20 17 (!) 21  Temp: 97.7 F (36.5 C) 100 F (37.8 C) 98.2 F (36.8 C) (!) 97 F (36.1 C)  TempSrc: Oral Oral Oral Axillary  SpO2: 97% 97% 97% 96%  Weight:      Height:       General: Thin male in no acute distress HENT: White plaques on the tongue  Pulm: Good air movement with no wheezing or crackles  CV: RRR, no murmurs, no rubs  Abdomen: Active bowel sounds with no tenderness to palpation, nephrostomy tubes in place  Extremities: Warm and dry, purulence surrounding the left arm IV with no tenderness to palpation   Assessment/Plan:  Coag negative staphylococcusbacteremia and Fungemia(Candida Tropicalis)  - Patient continues to improve and has been afebrile for >48 hours - Patient continues to have good urine output through his nephrostomy tubes. IR is planning to internalize his nephrostomy tubes pending blood culture results.  -Urology is following and will treat obstructive nephrolithiasis once infection is resolved. He will follow-up as an outpatient  - Transthoracicechocardiogramon 12/21illustrating no signs of endocarditis - Opthalmology evaluation illustrating no ocular involvement, recommending outpatient follow-up for OU cataract and diabetic eye exam  - Repeat blood cultures for 12/23 showing no growth at 24 hours. - ID onboard, recommend continuing Vancomycin and Eraxis for 14 days from sterile cultures.    Type 2 DM - Glucose control improving -Currently:CBG q4 with M-SSI, meal coverage and lantus 36U QHS  Hypokalemia  - Continues to be hypokalemic with normal Mg - Continue to replete with IV potassium +NS - No medications to explain his persistent hypokalemia. Renal function is stable.   Normocytic Anemia  - Hgb 10.4 today, stable compared to prior. RDW is within normal limits  - Will continue to monitor. No further evaluation at this point.  AKI. Resolved Prolonged QTc.Resolved   Dispo: Anticipated discharge in approximately >1 day(s). PT recommending SNF. Social work working on placement.   Levora DredgeHelberg, Dreux Mcgroarty, MD 06/17/2017, 5:58 AM My Pager: [phone]

## 2017-06-18 DIAGNOSIS — Z95828 Presence of other vascular implants and grafts: Secondary | ICD-10-CM

## 2017-06-18 DIAGNOSIS — N200 Calculus of kidney: Secondary | ICD-10-CM

## 2017-06-18 DIAGNOSIS — B370 Candidal stomatitis: Secondary | ICD-10-CM

## 2017-06-18 DIAGNOSIS — B957 Other staphylococcus as the cause of diseases classified elsewhere: Secondary | ICD-10-CM

## 2017-06-18 LAB — CBC
HEMATOCRIT: 30.9 % — AB (ref 39.0–52.0)
Hemoglobin: 9.8 g/dL — ABNORMAL LOW (ref 13.0–17.0)
MCH: 29.2 pg (ref 26.0–34.0)
MCHC: 31.7 g/dL (ref 30.0–36.0)
MCV: 92 fL (ref 78.0–100.0)
PLATELETS: 235 10*3/uL (ref 150–400)
RBC: 3.36 MIL/uL — ABNORMAL LOW (ref 4.22–5.81)
RDW: 12.6 % (ref 11.5–15.5)
WBC: 8.6 10*3/uL (ref 4.0–10.5)

## 2017-06-18 LAB — BASIC METABOLIC PANEL
Anion gap: 6 (ref 5–15)
BUN: 16 mg/dL (ref 6–20)
CO2: 28 mmol/L (ref 22–32)
CREATININE: 0.76 mg/dL (ref 0.61–1.24)
Calcium: 7.8 mg/dL — ABNORMAL LOW (ref 8.9–10.3)
Chloride: 105 mmol/L (ref 101–111)
GFR calc Af Amer: >60 mL/min (ref >60)
GLUCOSE: 205 mg/dL — AB (ref 65–99)
POTASSIUM: 3.5 mmol/L (ref 3.5–5.1)
SODIUM: 139 mmol/L (ref 135–145)

## 2017-06-18 LAB — GLUCOSE, CAPILLARY
GLUCOSE-CAPILLARY: 192 mg/dL — AB (ref 65–99)
GLUCOSE-CAPILLARY: 135 mg/dL — AB (ref 65–99)
Glucose-Capillary: 230 mg/dL — ABNORMAL HIGH (ref 65–99)
Glucose-Capillary: 238 mg/dL — ABNORMAL HIGH (ref 65–99)

## 2017-06-18 MED ORDER — SODIUM CHLORIDE 0.9% FLUSH
10.0000 mL | INTRAVENOUS | Status: DC | PRN
  Administered 2017-06-21 – 2017-06-22 (×2): 10 mL
  Filled 2017-06-18 (×2): qty 40

## 2017-06-18 MED ORDER — INSULIN ASPART 100 UNIT/ML ~~LOC~~ SOLN
0.0000 [IU] | Freq: Three times a day (TID) | SUBCUTANEOUS | Status: DC
  Administered 2017-06-18: 4 [IU] via SUBCUTANEOUS
  Administered 2017-06-18: 7 [IU] via SUBCUTANEOUS
  Administered 2017-06-19 (×2): 3 [IU] via SUBCUTANEOUS
  Administered 2017-06-19: 15 [IU] via SUBCUTANEOUS

## 2017-06-18 MED ORDER — SODIUM CHLORIDE 0.9% FLUSH
10.0000 mL | Freq: Two times a day (BID) | INTRAVENOUS | Status: DC
  Administered 2017-06-18 – 2017-06-22 (×2): 10 mL

## 2017-06-18 MED ORDER — NYSTATIN 100000 UNIT/ML MT SUSP
5.0000 mL | Freq: Four times a day (QID) | OROMUCOSAL | Status: DC
  Administered 2017-06-18 – 2017-06-21 (×9): 500000 [IU] via ORAL
  Filled 2017-06-18 (×12): qty 5

## 2017-06-18 NOTE — Progress Notes (Signed)
Patient ID: Alan SeedsGlenn A Amparo Jr., male   DOB: 06/24/1946, 71 y.o.   MRN: [id number]          [facility]    Date of Admission:  06/11/2017   Day 8 vancomycin        Day 5 anidulafungin  Mr. Alan [name] is now afebrile.  Blood cultures on admission grew methicillin-resistant coagulase-negative staph.  Repeat blood cultures 48 hours later on 06/13/2017 grew Candida tropicalis.  Repeat blood cultures done repeat blood cultures done on 06/15/2017 remain negative.  Eye exam did not reveal any evidence of candidal endophthalmitis.  Alan [name] will need 2 weeks of antimicrobial therapy following negative blood cultures (through 06/29/2017).  I will sign off now.         Cliffton AstersJohn Dorthia Tout, MD [facility] [phone] pager   [phone] cell 06/18/2017, 2:10 PM

## 2017-06-18 NOTE — Progress Notes (Addendum)
  Speech Language Pathology Treatment: Dysphagia  Patient Details Name: Margaretha SeedsGlenn A Kaczmarczyk Jr. MRN: [id number] DOB: 1945-09-28 Today's Date: 06/18/2017 Time: [id number] SLP Time Calculation (min) (ACUTE ONLY): 15 min  Assessment / Plan / Recommendation Clinical Impression  Pt confused and needed additional response time and repetition to process information and stated to this therapist "I'm not hard of hearing". Honey thick grape juice consumed without s/s aspiration. Pt initiated diet today but was put in Dys 1 and consistency not changed to honey (SLP ordered correctly). MBS on 12/21 and pt's alertness and mentation has improved since that time. Recommend repeating MBS to determine improvements and ability to safely upgrade possibly Friday.    HPI HPI: 71 year old male admitted with decreased responsiveness, confusion, worsening baseline cough, high fever, difficulty swallowing, decreased po intake. Being treated for candiduria and UTI. CXR negative.  PMH of ETOH abuse, dementia, CVA.       SLP Plan  Continue with current plan of care       Recommendations  Diet recommendations: Dysphagia 1 (puree);Honey-thick liquid Liquids provided via: Cup Medication Administration: Crushed with puree Supervision: Patient able to self feed;Full supervision/cueing for compensatory strategies Compensations: Small sips/bites;Slow rate;Minimize environmental distractions Postural Changes and/or Swallow Maneuvers: Seated upright 90 degrees                Oral Care Recommendations: Oral care BID Follow up Recommendations: 24 hour supervision/assistance SLP Visit Diagnosis: Dysphagia, oropharyngeal phase (R13.12) Plan: Continue with current plan of care       GO                Royce MacadamiaLitaker, Hartman Minahan Willis 06/18/2017, 3:29 PM

## 2017-06-18 NOTE — Progress Notes (Signed)
Internal Medicine Attending:   I saw and examined the patient. I reviewed the resident's note and I agree with the resident's findings and plan as documented in the resident's note. Doing well, plan for internalization of nephrostomy tubes 12/28.  Likely discharge to SNF after that happens.

## 2017-06-18 NOTE — Progress Notes (Addendum)
   Subjective: Patient doing well this AM. Denies right shoulder pain or abdominal pain. Discussed that the plan is to continue antibiotics and monitor his blood cultures. At this point they have shown no growth at 48 hours. Discussed that he will likely go for internalization of his nephrostomy tubes on Friday. He agrees with the plan. No questions or concerns at this point. Wife is not present at bedside.   Objective: Vital signs in last 24 hours: Vitals:   06/17/17 1822 06/17/17 2000 06/17/17 2351 06/18/17 0400  BP: (!) 160/57 135/70 (!) 150/55 (!) 133/49  Pulse: 70 78 62 61  Resp: 18 15 17    Temp: 98.5 F (36.9 C) 99.5 F (37.5 C) 98.7 F (37.1 C) 98.7 F (37.1 C)  TempSrc: Oral Oral Oral Oral  SpO2: 100% 99% 98% 97%  Weight:      Height:       General: Thin male in no acute distress HENT: White plaques in oropharynx  Pulm: Good air movement with no wheezing or crackles  CV: RRR, no murmurs, no rubs  Abdomen: Active bowel sounds, soft, non-distended, no tenderness to palpation, nephrostomy tubes in place  Extremities: Warm and dry, no LE edema   Assessment/Plan:  Coag negative staphylococcusbacteremia and Fungemia(Candida Tropicalis) - Patient continues to improve and has been afebrile for >48 hours -Patient continues to have good urine output through his nephrostomy tubes. IR is planning to internalize his nephrostomy tubes pending blood culture results, likely get the procedure on 12/28.  -He will follow-up with Urology as an outpatient for his obstructive nephrolithiasis - Transthoracicechocardiogramon 12/21illustrating no signs of endocarditis - Opthalmology evaluation illustrating no ocular involvement, recommending outpatient follow-up for OU cataract and diabetic eye exam - Repeat blood culturesfor 12/23 showing no growth at 48 hours. - ID onboard, recommend continuing Vancomycin and Eraxisfor 14 days from sterile cultures.  - PICC line inserted yesterday  after purulent drainage was noted around the left peripheral IV site.  Type 2 DM - Glucose control improving -Currently:CBG q4 withM-SSI, meal coverage and lantus36U QHS - Switch Lantus to 40 U QHS and R-SSI  Hypokalemia  -Potassium improved to 3.5 today - Continue IV replacement   Normocytic Anemia  - Hgb9.8today which has slowly trended down from baseline of 11-12 - Continue to monitor daily. Likely suppressed production in the setting of systemic infection   Oral Thrush  - Nystatin oral solution ordered.   AKI. Resolved Prolonged QTc.Resolved  Dispo: Anticipated discharge in approximately >1 day(s). PT recommending SNF. Social work working on placement.   Levora DredgeHelberg, Alan Panebianco, MD 06/18/2017, 5:58 AM My Pager: [phone]

## 2017-06-18 NOTE — Progress Notes (Signed)
PT Cancellation Note  Patient Details Name: Alan SeedsGlenn A Narvaez Jr. MRN: [id number] DOB: 1945/07/04   Cancelled Treatment:    Reason Eval/Treat Not Completed: (P) Other (comment);Patient declined, no reason specified(Pt appears to present with cognitive deficits, patient educated on the benefit of mobility and the risk of immobilization, patient remains to decline and reports, "I don't want to give up either but there are things I need to do."  Pt the staring out the window and refused to look back at PTA.  Will f/u pending patient participation.   Tashiba Timoney Artis DelayJ Zaidan Keeble 06/18/2017, 5:02 PM  Joycelyn RuaAimee Ellinore Merced, PTA pager [phone]

## 2017-06-19 LAB — CBC
HCT: 31.5 % — ABNORMAL LOW (ref 39.0–52.0)
HEMOGLOBIN: 10 g/dL — AB (ref 13.0–17.0)
MCH: 29.1 pg (ref 26.0–34.0)
MCHC: 31.7 g/dL (ref 30.0–36.0)
MCV: 91.6 fL (ref 78.0–100.0)
Platelets: 259 10*3/uL (ref 150–400)
RBC: 3.44 MIL/uL — AB (ref 4.22–5.81)
RDW: 12.7 % (ref 11.5–15.5)
WBC: 8.5 10*3/uL (ref 4.0–10.5)

## 2017-06-19 LAB — BASIC METABOLIC PANEL
ANION GAP: 7 (ref 5–15)
BUN: 13 mg/dL (ref 6–20)
CHLORIDE: 106 mmol/L (ref 101–111)
CO2: 27 mmol/L (ref 22–32)
Calcium: 8 mg/dL — ABNORMAL LOW (ref 8.9–10.3)
Creatinine, Ser: 0.65 mg/dL (ref 0.61–1.24)
GFR calc non Af Amer: >60 mL/min (ref >60)
GLUCOSE: 112 mg/dL — AB (ref 65–99)
Potassium: 3.6 mmol/L (ref 3.5–5.1)
Sodium: 140 mmol/L (ref 135–145)

## 2017-06-19 LAB — GLUCOSE, CAPILLARY
GLUCOSE-CAPILLARY: 315 mg/dL — AB (ref 65–99)
Glucose-Capillary: 142 mg/dL — ABNORMAL HIGH (ref 65–99)
Glucose-Capillary: 135 mg/dL — ABNORMAL HIGH (ref 65–99)
Glucose-Capillary: 190 mg/dL — ABNORMAL HIGH (ref 65–99)

## 2017-06-19 MED ORDER — VANCOMYCIN HCL 10 G IV SOLR
1500.0000 mg | INTRAVENOUS | Status: DC
  Administered 2017-06-20 – 2017-06-23 (×4): 1500 mg via INTRAVENOUS
  Filled 2017-06-19 (×4): qty 1500

## 2017-06-19 MED ORDER — HEPARIN SODIUM (PORCINE) 5000 UNIT/ML IJ SOLN
5000.0000 [IU] | Freq: Three times a day (TID) | INTRAMUSCULAR | Status: DC
  Administered 2017-06-21 – 2017-06-23 (×7): 5000 [IU] via SUBCUTANEOUS
  Filled 2017-06-19 (×6): qty 1

## 2017-06-19 NOTE — Progress Notes (Signed)
Pharmacy Antibiotic Note  Alan SeedsGlenn A Hayne Jr. is a 71 y.o. male with CONS and candida tropicalis bacteremia and sepsis.  Pharmacy has been consulted for  and vancomycin dosing.  Afebrile, WBC wnl.  AKI on admit, now resolved. Vancomycin dose was adjusted down for trough of 22 (prior to 3rd dose, not at steady state) on 12/21. Plan is to continue vancomycin through 06/29/17 per ID consult.  Given the fact that he will probably be discharged prior to completing vancomycin therapy, will adjust dosing to q24h regimen  12/25 VT: 23 on 1g q12h (11hr trough > true trough ~21)  Plan: Change vancomycin to 1.5g IV q24 -Monitor clinical progress, c/s, renal function, vancomycin trough at steady state Outpatient parenteral antibiotic orders pended for MD to sign (vancomycin and anidulafungin through 06/29/17)  Height: 6' (182.9 cm) Weight: 163 lb 9.3 oz (74.2 kg) IBW/kg (Calculated) : 77.6  Temp (24hrs), Avg:98.4 F (36.9 C), Min:97.6 F (36.4 C), Max:99.1 F (37.3 C)  Recent Labs  Lab 06/12/17 1135 06/12/17 1305  06/13/17 0038  06/15/17 0211 06/16/17 0416 06/17/17 0331 06/17/17 1203 06/18/17 0351 06/19/17 0402  WBC  --   --   --  12.8*   < > 9.4 9.1 9.1  --  8.6 8.5  CREATININE  --   --    < > 2.29*   < > 0.95 0.82 0.89  --  0.76 0.65  LATICACIDVEN 1.2 1.2  --   --   --   --   --   --   --   --   --   VANCOTROUGH  --   --   --  22*  --   --   --   --  23*  --   --    < > = values in this interval not displayed.    Estimated Creatinine Clearance: 91.5 mL/min (by C-G formula based on SCr of 0.65 mg/dL).   Alan [name], PharmD, BCPS-AQ ID Clinical Pharmacist Pager [phone]  06/19/2017 11:13 AM

## 2017-06-19 NOTE — Plan of Care (Signed)
  Education: Knowledge of General Education information will improve 06/19/2017 0014 - Progressing by Renelda MomWorley, Rodrigo Mcgranahan L, RN   Fluid Volume: Hemodynamic stability will improve 06/19/2017 0014 - Progressing by Renelda MomWorley, Taliesin Hartlage L, RN   Clinical Measurements: Diagnostic test results will improve 06/19/2017 0014 - Progressing by Renelda MomWorley, Floyed Masoud L, RN

## 2017-06-19 NOTE — Progress Notes (Signed)
   Subjective: Patient is doing well this AM. He is tired of being in the hospital but otherwise feels fine. We discussed that his blood cultures have shown no growth to date and that he will go for internalization of his nephrostomy tubes tomorrow. He voices understanding and agrees with the plan. All questions and concerns addressed.   Objective: Vital signs in last 24 hours: Vitals:   06/18/17 1554 06/18/17 2000 06/18/17 2352 06/19/17 0400  BP: 113/77 136/76 (!) 139/53 (!) 152/51  Pulse: 90 70 74 73  Resp:  (!) 23 18 18   Temp: 97.6 F (36.4 C) 98.5 F (36.9 C) 99.1 F (37.3 C) 98.9 F (37.2 C)  TempSrc: Axillary Oral Oral Oral  SpO2: 99% 100% 100% 100%  Weight:      Height:       General: Thin male in no acute distress HENT: White plaques in the oropharynx  Pulm: Good air movement with no wheezing or crackles  CV: RRR, no murmurs, no rubs  Abdomen: Active bowel sounds, soft, non-distended, no tenderness to palpation, nephrostomy tubes in place.  Extremities: Warm and dry with no LE edema   Assessment/Plan:  Coag negative staphylococcusbacteremia and Fungemia(Candida Tropicalis). Alan [name] is a 71 y.o male with severe vascular disease and vascular dementia who presented to the ED with altered mental status. He was found to be septic and work-up reveal Coag negative staphylococcusbacteremia and Fungemia(Candida Tropicalis). The source of his bacteremia and fungemia was felt to be secondary to obstructive nephrolithiasis. Urology was consulted who requested IR to place bilateral nephrostomy tubes and outpatient follow-up once the patient's acute illness had resolved. At which point they would evaluate his nephrolithiasis. Infectious disease was consulted and changed the patient's antimicrobial regimen to Vancomycin and Eraxis. Echocardiogram was obtained that illustrated no signs of endocarditis (TEE was not pursued due to patient's comorbidites). Opthalmology was also consulted  and no occular involvement was noted. Repeat blood cultures on 12/23 have shown no growth to date and will serve as the start date of the patient antimicrobial regimen. ID is recommending 14 days of antimicrobial therapy. Over the course of the patient's hospitalization his mental status has significantly improved.   - IR is planning to internalize his nephrostomy tubespending blood culture results, procedure on 12/28.Patient is NPO at midnight.  - Repeat blood culturesfor 12/23showing no growth top date. - PT recommending SNF. PICC line in place.   Loose Stools. Patient has a history of frequent loose BMs that worsed acutely in the setting of antibiotic use. C. Diff was checked and was negative. The patient symptoms and stool consistence are not consistent with infectious etiology. It is likely that the patient had exacerbation of his normal bowel habits due to the antimicrobial regimen used. He is being treated with Loperamide.   Type 2 DM - Glucosecontrol improving -Currently:CBG q4 withR-SSI, meal coverage and lantus40U QHS  Hypokalemia  -Potassium improved to 3.6 today - Continue IV replacement   Normocytic Anemia  - Hgb 10.0today which has slowly trended down from baseline of 11-12 - Continue to monitor daily. Likely suppressed production in the setting of systemic infection   Oral Thrush  - Nystatin oral solution ordered.   AKI. Resolved Prolonged QTc.Resolved  Dispo: Anticipated discharge in approximately>1day(s). PT recommending SNF. Social work working on placement.  Alan [name], Alan Sirico, MD 06/19/2017, 6:29 AM My Pager: [phone]

## 2017-06-19 NOTE — Care Management Important Message (Signed)
Important Message  Patient Details  Name: Margaretha SeedsGlenn A Marban Jr. MRN: [id number] Date of Birth: 11/01/1945   Medicare Important Message Given:  Yes    Kyla BalzarineShealy, Tait Balistreri Abena 06/19/2017, 11:56 AM

## 2017-06-19 NOTE — Progress Notes (Signed)
Physical Therapy Treatment Patient Details Name: Alan SeedsGlenn A Campion Jr. 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  Work up findings includ bacteremia due to ascending obstructive pyelonephritis, s/p nephrostomy tubes 12/20.    PT Comments    Pt performed transfer from bed to chair with +2 assistance.  Wife present to assist and reports his transfer assist levels change on a day to day basis.  Plan for safety remains for short term SNF placement.  Pt continues to be limited due to cognition.      Follow Up Recommendations  SNF;Supervision/Assistance - 24 hour     Equipment Recommendations  None recommended by PT;Other (comment)(TBA at next venue.  )    Recommendations for Other Services       Precautions / Restrictions Precautions Precautions: Fall Precaution Comments: drainage tubes; at risk for skin breakdown Restrictions Weight Bearing Restrictions: No Other Position/Activity Restrictions: per wife L wrist is fractured.  no order in chart    Mobility  Bed Mobility Overal bed mobility: Needs Assistance Bed Mobility: Supine to Sit;Sit to Supine     Supine to sit: Mod assist;+2 for physical assistance     General bed mobility comments: Pt required assistance to move B LEs to edge of bed and scoot forward to the edge of the bed in prep for transfer from bed to chair.  Pt with improved head and trunk control from previous session.  mod assist +1 to maintain balance.    Transfers Overall transfer level: Needs assistance Equipment used: None Transfers: Sit to/from Stand;Lateral/Scoot Transfers(attempted but patient unable to clear his bottom to stand.  )          Lateral/Scoot Transfers: Max assist;+2 physical assistance General transfer comment: Max assist +2 to scoot patient  to drop arm chair from elevated bed.  Pt required cues for hand placement and assist to facilitate forward weight shifting.  +2 to scoot back in recliner for optimal positioning.    Ambulation/Gait Ambulation/Gait assistance: (unable to perform.  )               Stairs            Wheelchair Mobility    Modified Rankin (Stroke Patients Only)       Balance     Sitting balance-Leahy Scale: Poor Sitting balance - Comments: mod assist to maintain posterior lean observed but balance much improved from last session.                                      Cognition Arousal/Alertness: Awake/alert Behavior During Therapy: Flat affect Overall Cognitive Status: Impaired/Different from baseline Area of Impairment: Orientation;Attention;Memory;Following commands;Safety/judgement;Awareness;Problem solving                 Orientation Level: Disoriented to;Time;Place;Situation Current Attention Level: Sustained Memory: Decreased recall of precautions;Decreased short-term memory Following Commands: Follows one step commands inconsistently Safety/Judgement: Decreased awareness of safety;Decreased awareness of deficits Awareness: Intellectual Problem Solving: Slow processing;Decreased initiation;Difficulty sequencing General Comments: Pt has difficulty maintaining focus on task which hinders progression with tx.        Exercises General Exercises - Lower Extremity Long Arc Quad: AROM;Both;10 reps;Seated Hip ABduction/ADduction: AAROM;Both;10 reps;Seated Hip Flexion/Marching: AROM;Both;10  reps;Seated Pt required increasing time and cueing to complete therapeutic exercises.     General Comments        Pertinent Vitals/Pain Pain Assessment: Faces Faces Pain Scale: No hurt    Home Living                      Prior Function            PT Goals (current goals can now be found in the care plan section) Acute Rehab PT Goals Patient Stated Goal:  wifes goal is for pt to return home wiht her care Potential to Achieve Goals: Fair Progress towards PT goals: Progressing toward goals    Frequency    Min 3X/week      PT Plan Current plan remains appropriate    Co-evaluation              AM-PAC PT "6 Clicks" Daily Activity  Outcome Measure  Difficulty turning over in bed (including adjusting bedclothes, sheets and blankets)?: Unable Difficulty moving from lying on back to sitting on the side of the bed? : Unable Difficulty sitting down on and standing up from a chair with arms (e.g., wheelchair, bedside commode, etc,.)?: Unable Help needed moving to and from a bed to chair (including a wheelchair)?: Total Help needed walking in hospital room?: Total Help needed climbing 3-5 steps with a railing? : Total 6 Click Score: 6    End of Session Equipment Utilized During Treatment: Gait belt Activity Tolerance: Patient limited by fatigue Patient left: with call bell/phone within reach;with bed alarm set;in chair;with chair alarm set Nurse Communication: Mobility status;Need for lift equipment(maximove lift pad placed under patient for transfer back to bed.  Nurse and NT informed.  ) PT Visit Diagnosis: Muscle weakness (generalized) (M62.81);Other symptoms and signs involving the nervous system (R29.898);Other abnormalities of gait and mobility (R26.89)     Time: [id number] PT Time Calculation (min) (ACUTE ONLY): 25 min  Charges:  $Therapeutic Exercise: 8-22 mins $Therapeutic Activity: 8-22 mins                    G Codes:       Joycelyn RuaAimee Verlin Uher, PTA pager [phone]    Florestine AversAimee J Shelbey Spindler 06/19/2017, 4:22 PM

## 2017-06-19 NOTE — Progress Notes (Signed)
PT Cancellation Note  Patient Details Name: Margaretha SeedsGlenn A Bertoni Jr. MRN: [id number] DOB: August 12, 1945   Cancelled Treatment:    Reason Eval/Treat Not Completed: (P) Patient declined, no reason specified(Pt refused as he is eating his lunch, will return this pm for OOB mobility.  )   Ameka Krigbaum J Tranae Laramie 06/19/2017, 12:37 PM  Joycelyn RuaAimee Kayo Zion, PTA pager [phone]

## 2017-06-19 NOTE — Progress Notes (Signed)
Pt sitting up in chair, repeatedly removing telemetry leads.  MD paged and orders to d/c tele received.

## 2017-06-20 ENCOUNTER — Inpatient Hospital Stay (HOSPITAL_COMMUNITY): Payer: Medicare (Managed Care)

## 2017-06-20 ENCOUNTER — Encounter (HOSPITAL_COMMUNITY): Payer: Self-pay | Admitting: Interventional Radiology

## 2017-06-20 DIAGNOSIS — R197 Diarrhea, unspecified: Secondary | ICD-10-CM

## 2017-06-20 DIAGNOSIS — N200 Calculus of kidney: Secondary | ICD-10-CM

## 2017-06-20 DIAGNOSIS — B379 Candidiasis, unspecified: Secondary | ICD-10-CM | POA: Diagnosis present

## 2017-06-20 DIAGNOSIS — E11649 Type 2 diabetes mellitus with hypoglycemia without coma: Secondary | ICD-10-CM

## 2017-06-20 HISTORY — PX: IR URETERAL STENT PLACEMENT EXISTING ACCESS RIGHT: IMG6074

## 2017-06-20 HISTORY — PX: IR NEPHROSTOMY EXCHANGE RIGHT: IMG6070

## 2017-06-20 HISTORY — PX: IR NEPHROSTOMY EXCHANGE LEFT: IMG6069

## 2017-06-20 HISTORY — PX: IR URETERAL STENT PLACEMENT EXISTING ACCESS LEFT: IMG6073

## 2017-06-20 LAB — GLUCOSE, CAPILLARY
GLUCOSE-CAPILLARY: 119 mg/dL — AB (ref 65–99)
GLUCOSE-CAPILLARY: 66 mg/dL (ref 65–99)
GLUCOSE-CAPILLARY: 120 mg/dL — AB (ref 65–99)
GLUCOSE-CAPILLARY: 214 mg/dL — AB (ref 65–99)
Glucose-Capillary: 62 mg/dL — ABNORMAL LOW (ref 65–99)
Glucose-Capillary: 101 mg/dL — ABNORMAL HIGH (ref 65–99)

## 2017-06-20 LAB — BASIC METABOLIC PANEL
Anion gap: 7 (ref 5–15)
BUN: 12 mg/dL (ref 6–20)
CO2: 27 mmol/L (ref 22–32)
Calcium: 8.1 mg/dL — ABNORMAL LOW (ref 8.9–10.3)
Chloride: 105 mmol/L (ref 101–111)
Creatinine, Ser: 0.71 mg/dL (ref 0.61–1.24)
GFR calc Af Amer: >60 mL/min (ref >60)
GLUCOSE: 72 mg/dL (ref 65–99)
POTASSIUM: 3.2 mmol/L — AB (ref 3.5–5.1)
Sodium: 139 mmol/L (ref 135–145)

## 2017-06-20 LAB — CULTURE, BLOOD (ROUTINE X 2)
CULTURE: NO GROWTH
Special Requests: ADEQUATE

## 2017-06-20 LAB — CBC
HEMATOCRIT: 31.3 % — AB (ref 39.0–52.0)
Hemoglobin: 10.1 g/dL — ABNORMAL LOW (ref 13.0–17.0)
MCH: 29.5 pg (ref 26.0–34.0)
MCHC: 32.3 g/dL (ref 30.0–36.0)
MCV: 91.5 fL (ref 78.0–100.0)
Platelets: 266 10*3/uL (ref 150–400)
RBC: 3.42 MIL/uL — ABNORMAL LOW (ref 4.22–5.81)
RDW: 12.8 % (ref 11.5–15.5)
WBC: 9.7 10*3/uL (ref 4.0–10.5)

## 2017-06-20 MED ORDER — DEXTROSE 50 % IV SOLN
25.0000 mL | Freq: Once | INTRAVENOUS | Status: AC
  Administered 2017-06-20: 25 mL via INTRAVENOUS

## 2017-06-20 MED ORDER — LIDOCAINE HCL (PF) 1 % IJ SOLN
INTRAMUSCULAR | Status: AC
  Filled 2017-06-20: qty 30

## 2017-06-20 MED ORDER — CIPROFLOXACIN IN D5W 400 MG/200ML IV SOLN
400.0000 mg | INTRAVENOUS | Status: AC
  Administered 2017-06-20: 400 mg via INTRAVENOUS
  Filled 2017-06-20: qty 200

## 2017-06-20 MED ORDER — POTASSIUM CHLORIDE CRYS ER 20 MEQ PO TBCR
40.0000 meq | EXTENDED_RELEASE_TABLET | Freq: Two times a day (BID) | ORAL | Status: AC
  Administered 2017-06-20: 40 meq via ORAL
  Filled 2017-06-20 (×2): qty 2

## 2017-06-20 MED ORDER — INSULIN ASPART 100 UNIT/ML ~~LOC~~ SOLN
0.0000 [IU] | Freq: Three times a day (TID) | SUBCUTANEOUS | Status: DC
  Administered 2017-06-21: 3 [IU] via SUBCUTANEOUS
  Administered 2017-06-21 (×2): 2 [IU] via SUBCUTANEOUS
  Administered 2017-06-22: 5 [IU] via SUBCUTANEOUS
  Administered 2017-06-23: 1 [IU] via SUBCUTANEOUS

## 2017-06-20 MED ORDER — CIPROFLOXACIN IN D5W 400 MG/200ML IV SOLN
INTRAVENOUS | Status: AC
  Filled 2017-06-20: qty 200

## 2017-06-20 MED ORDER — LIDOCAINE HCL 1 % IJ SOLN
INTRAMUSCULAR | Status: AC | PRN
  Administered 2017-06-20: 15 mL

## 2017-06-20 MED ORDER — FENTANYL CITRATE (PF) 100 MCG/2ML IJ SOLN
INTRAMUSCULAR | Status: AC | PRN
  Administered 2017-06-20 (×4): 50 ug via INTRAVENOUS

## 2017-06-20 MED ORDER — INSULIN ASPART 100 UNIT/ML ~~LOC~~ SOLN
0.0000 [IU] | Freq: Every day | SUBCUTANEOUS | Status: DC
  Administered 2017-06-20 – 2017-06-21 (×2): 2 [IU] via SUBCUTANEOUS

## 2017-06-20 MED ORDER — LORAZEPAM 0.5 MG PO TABS
0.5000 mg | ORAL_TABLET | Freq: Once | ORAL | Status: AC | PRN
  Administered 2017-06-20: 0.5 mg via ORAL
  Filled 2017-06-20: qty 1

## 2017-06-20 MED ORDER — DEXTROSE 50 % IV SOLN
INTRAVENOUS | Status: AC
  Administered 2017-06-20: 25 mL via INTRAVENOUS
  Filled 2017-06-20: qty 50

## 2017-06-20 MED ORDER — FENTANYL CITRATE (PF) 100 MCG/2ML IJ SOLN
INTRAMUSCULAR | Status: AC
  Filled 2017-06-20: qty 4

## 2017-06-20 MED ORDER — SODIUM CHLORIDE 0.9% FLUSH
5.0000 mL | Freq: Three times a day (TID) | INTRAVENOUS | Status: DC
  Administered 2017-06-21 – 2017-06-22 (×3): 5 mL via INTRAVENOUS

## 2017-06-20 MED ORDER — IOPAMIDOL (ISOVUE-300) INJECTION 61%
INTRAVENOUS | Status: AC
  Administered 2017-06-20: 40 mL
  Filled 2017-06-20: qty 50

## 2017-06-20 MED ORDER — RAMELTEON 8 MG PO TABS
8.0000 mg | ORAL_TABLET | Freq: Every day | ORAL | Status: DC
  Administered 2017-06-20 – 2017-06-22 (×3): 8 mg via ORAL
  Filled 2017-06-20 (×4): qty 1

## 2017-06-20 NOTE — Sedation Documentation (Signed)
Patient is resting comfortably. 

## 2017-06-20 NOTE — Progress Notes (Signed)
   Subjective:   No acute events overnight. Not very talkative this morning but oriented to self. Shook his head when asked if he was in pain. Did not voice any complaints this morning. Discussed internalization of nephrostomy tube today and discharge to SNF today vs tomorrow.   Objective: Vital signs in last 24 hours: Vitals:   06/19/17 1200 06/19/17 2143 06/20/17 0040 06/20/17 0300  BP: 98/78 132/69 131/78 130/78  Pulse: 74   76  Resp: 20 18    Temp:  98.5 F (36.9 C) 99.2 F (37.3 C) 98.8 F (37.1 C)  TempSrc:  Oral Oral Oral  SpO2:  97%    Weight:      Height:       General: elderly male, appears chronically ill, lying in bed in no acute distress  CV: RRR, nl S1/S2, no murmurs, rubs or gallops  Abd: soft, tender over LUQ, normoacitve bowel sounds  Neuro:oriented to self only Ext: warm and well perfused, no peripheral edema bilaterally    Assessment/Plan:  Alan [name] is a 71 y.o male with severe vascular disease and vascular dementia who presented to the ED with altered mental status. He was found to be septic and work-up reveal Coag negative staphylococcusbacteremia and Fungemia(Candida Tropicalis).   # Coag negative staphylococcusbacteremia and Fungemia(Candida Tropicalis): secondary to obstructive nephrolithiasis. Now s/p bilateral nephrostomy tubes with plan for internalization today due to concern patient might pulled them off. Bcx 12/23 with NG at 4 days. On Vancomycin and Eraxis ID recommended 14 days of antimicrobial therapy.  - Plan for nephrostomy tube internalization today. Will follow up with IR recommendations after procedure. - Follow up with SW regarding SNF placement   # Loose Stools. Patient has a history of frequent loose BMs that worsened acutely in the setting of antibiotic use. C. Diff negative. The patient symptoms and stool consistence are not consistent with infectious etiology. It is likely that the patient had exacerbation of his normal bowel  habits due to the antimicrobial regimen used. - Continue Loperamide 4 mg q6h PRN    #Type 2 DM:  -CBG q4 withR-SSI, meal coverage and lantus40U QHS  # Hypokalemia: K 3.2 this AM.  -Kdur 40 mEq BID  - Continue to monitor   # Normocytic Anemia: Hgb 10.1today which has slowly trended down from baseline of 11-12 - Continue to monitor daily. Likely suppressed production in the setting of systemic infection   # Oral Thrush  - Nystatin oral solution ordered.   #AKI. Resolved  #Prolonged QTc.Resolved  Dispo: Anticipated discharge in approximately 1-2day(s) pending urologic procedure and SNF placement.  Burna CashSantos-Sanchez, Alan Lombard, MD 06/20/2017, 6:55 AM My Pager: [phone]

## 2017-06-20 NOTE — Progress Notes (Signed)
IR called regarding pt procedure that he is NPO for because he just had to receive second dose of dextrose today for CBG in 60s.  IR hopes to get pt in around 1400.  Primary MDs also notified of hypoglycemia.

## 2017-06-20 NOTE — Procedures (Signed)
Interventional Radiology Procedure Note  Procedure: bilateral antegrade nephrostogram via existing PCN's, with placement of left 26cm ureteral stent, and right 28cm ureteral stent.   Exchange of bilateral PCN, which are now capped for antegrade drainage.   Findings: left ureteral obstruction at the UVJ.  No obstruction of the right ureter.   Complications: None  Recommendations:  - If patient experiences increasing flank pain, would uncap the PCN's, otherwise, may stay capped until removal as outpatient in 2-4 weeks.  - Follow up with nephrology/urology.  - Do not submerge   Signed,  Yvone NeuJaime S. Loreta AveWagner, DO

## 2017-06-20 NOTE — Progress Notes (Signed)
SLP Cancellation Note  Patient Details Name: Alan SeedsGlenn A Sadlowski Jr. MRN: [id number] DOB: 1945-08-11   Cancelled treatment:        Planned for repeat MBS to determine po upgrade however pt having IR procedure today. Will continue efforts.   Royce MacadamiaLitaker, Alan [name] 06/20/2017, 9:16 AM  Breck CoonsLisa [name] Lonell FaceLitaker M.Ed [employer]SLP Pager [phone]

## 2017-06-20 NOTE — Progress Notes (Addendum)
Inpatient Diabetes Program Recommendations  AACE/ADA: New Consensus Statement on Inpatient Glycemic Control (2015)  Target Ranges:  Prepandial:   less than 140 mg/dL      Peak postprandial:   less than 180 mg/dL (1-2 hours)      Critically ill patients:  140 - 180 mg/dL   Lab Results  Component Value Date   GLUCAP 120 (H) 06/20/2017   HGBA1C 9.5 (H) 10/30/2016    Review of Glycemic Control Results for Alan [name], Alan A JR. (MRN [id number]) as of 06/20/2017 13:09  Ref. Range 06/19/2017 21:24 06/20/2017 06:10 06/20/2017 06:51 06/20/2017 11:43 06/20/2017 12:57  Glucose-Capillary Latest Ref Range: 65 - 99 mg/dL 253190 (H) 62 (L) 664119 (H) 66 120 (H)   Diabetes history: DM2 Outpatient Diabetes medications: Lantus 40 units + Metformin 500 mg bid Current orders for Inpatient glycemic control: Lantus 40 units qd + Novolog correction 0-20 units tid  Inpatient Diabetes Program Recommendations:    Noted hypoglycemia. -Decrease Novolog correction to sensitive -Add hs correction 0-5 units -A1c to determine prehospital glycemic control  Thank you, Alan FischerJudy E. [name] Breeze, RN, MSN, CDE  Diabetes Coordinator Inpatient Glycemic Control Team Team Pager [phone] (8am-5pm) 06/20/2017 1:35 PM

## 2017-06-20 NOTE — [place] FL2 (Signed)
[place] MEDICAID FL2 LEVEL OF CARE SCREENING TOOL     IDENTIFICATION  Patient Name: Margaretha SeedsGlenn A Ha Jr. Birthdate: 1946-03-07 Sex: male Admission Date (Current Location): 06/11/2017  [facility] [place]   Facility and Address:  [facility], [address], [place], [place] [address]      Provider Number: [id number]  Attending Physician Name and Address:  Gust RungHoffman, Erik C, DO  Relative Name and Phone Number:  Arelia SneddonCarrie Dockter, [phone]    Current Level of Care: Hospital Recommended Level of Care: Skilled Nursing Facility Prior Approval Number:    Date Approved/Denied:   PASRR Number: [id number]  Discharge Plan: SNF    Current Diagnoses: Patient Active Problem List   Diagnosis Date Noted  . History of nephrostomy (HCC)   . Candidemia (HCC) 06/15/2017  . Obstruction of kidney   . Sepsis (HCC) 06/11/2017  . 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    Orientation RESPIRATION BLADDER Height & Weight     Place, Self  Normal Incontinent Weight: 163 lb 9.3 oz (74.2 kg) Height:  6' (182.9 cm)  BEHAVIORAL SYMPTOMS/MOOD NEUROLOGICAL BOWEL NUTRITION STATUS      Incontinent    AMBULATORY STATUS COMMUNICATION OF NEEDS Skin   Extensive Assist Verbally Normal                       Personal Care Assistance Level of Assistance  Bathing, Feeding, Dressing Bathing Assistance: Maximum assistance Feeding assistance: Limited assistance Dressing Assistance: Maximum assistance     Functional Limitations Info  Sight, Hearing, Speech Sight Info: Adequate Hearing Info: Adequate Speech Info: Impaired(slurred)    SPECIAL CARE FACTORS FREQUENCY  PT (By licensed PT), OT (By licensed OT)     PT Frequency: 5x wk OT Frequency: 5x wk            Contractures      Additional Factors Info  Allergies Code Status Info: DNR Allergies Info: PENICILLINS     Isolation Precautions Info: Contact, MRSA     Current Medications  (06/20/2017):  This is the current hospital active medication list Current Facility-Administered Medications  Medication Dose Route Frequency Provider Last Rate Last Dose  . acetaminophen (TYLENOL) suppository 975 mg  975 mg Rectal Q6H PRN Ginger CarneHarden, Kyler, MD   975 mg at 06/14/17 1433   Or  . acetaminophen (TYLENOL) tablet 1,000 mg  1,000 mg Oral Q6H PRN Ginger CarneHarden, Kyler, MD   1,000 mg at 06/16/17 2127  . anidulafungin (ERAXIS) 100 mg in sodium chloride 0.9 % 100 mL IVPB  100 mg Intravenous Q24H Gust RungHoffman, Erik C, DO   Stopped at 06/19/17 2334  . cyclobenzaprine (FLEXERIL) tablet 5 mg  5 mg Oral QHS PRN Levora DredgeHelberg, Justin, MD      . diclofenac sodium (VOLTAREN) 1 % transdermal gel 2 g  2 g Topical BID PRN Lanelle BalHarbrecht, Lawrence, MD   2 g at 06/14/17 2043  . [START ON 06/21/2017] heparin injection 5,000 Units  5,000 Units Subcutaneous Q8H Ralene Muskraturpin, Pamela, PA-C      . insulin aspart (novoLOG) injection 0-20 Units  0-20 Units Subcutaneous TID WC Levora DredgeHelberg, Justin, MD   15 Units at 06/19/17 1753  . insulin glargine (LANTUS) injection 40 Units  40 Units Subcutaneous QHS Levora DredgeHelberg, Justin, MD   40 Units at 06/19/17 2154  . loperamide (IMODIUM) capsule 4 mg  4 mg Oral Q6H PRN Helberg, Justin, MD      . nystatin (MYCOSTATIN) 100000 UNIT/ML suspension 500,000  Units  5 mL Oral QID Levora DredgeHelberg, Justin, MD   500,000 Units at 06/19/17 2154  . potassium chloride SA (K-DUR,KLOR-CON) CR tablet 40 mEq  40 mEq Oral BID Santos-Sanchez, Chelsea PrimusIdalys, MD      . senna-docusate (Senokot-S) tablet 1 tablet  1 tablet Oral QHS PRN Geralyn CorwinHoffman, Jessica Ratliff, DO      . sodium chloride flush (NS) 0.9 % injection 10-40 mL  10-40 mL Intracatheter PRN [name], Emily B, MD   10 mL at 06/18/17 0342  . sodium chloride flush (NS) 0.9 % injection 10-40 mL  10-40 mL Intracatheter Q12H Miguel AschoffWilliams, Julie Anne, MD   10 mL at 06/18/17 2134  . sodium chloride flush (NS) 0.9 % injection 10-40 mL  10-40 mL Intracatheter PRN Miguel AschoffWilliams, Julie Anne, MD      . vancomycin  [facility](VANCOCIN) 1,500 mg in sodium chloride 0.9 % 500 mL IVPB  1,500 mg Intravenous Q24H Gust RungHoffman, Erik C, DO   Stopped at 06/20/17 0800     Discharge Medications: Please see discharge summary for a list of discharge medications.  Relevant Imaging Results:  Relevant Lab Results:   Additional Information SS# [id number]  Althea CharonAshley C Van Ehlert, LCSW

## 2017-06-20 NOTE — Progress Notes (Signed)
Physical Therapy Treatment Patient Details Name: Margaretha SeedsGlenn A Renken Jr. MRN: [id number] DOB: 08-Jul-1945 Today's Date: 06/20/2017    History of Present Illness pt is a 71 y/o male with pmh significant for DM, CAD, dementia, CVA, PAD s/p L BKA and R transmet amputation and alcohol use d/o , presenting with sepsis.  Work up findings includ bacteremia due to ascending obstructive pyelonephritis, s/p nephrostomy tubes 12/20.    PT Comments    Pt performed sitting edge of bed x 5 min before requesting to return to supine position.  Pt required cues for right and balance.  Pt performed supine exercises in bed with max cues for maintaining focus on task.     Follow Up Recommendations  SNF;Supervision/Assistance - 24 hour     Equipment Recommendations  None recommended by PT;Other (comment)(TBA at next venue)    Recommendations for Other Services       Precautions / Restrictions Precautions Precautions: Fall Precaution Comments: drainage tubes; at risk for skin breakdown Restrictions Weight Bearing Restrictions: No Other Position/Activity Restrictions: per wife L wrist is fractured.  no order in chart, patient has L wrist splint    Mobility  Bed Mobility Overal bed mobility: Needs Assistance Bed Mobility: Supine to Sit;Sit to Supine     Supine to sit: Mod assist Sit to supine: Max assist   General bed mobility comments: Pt required cues for LE advancement to edge of bed and assist to elevate trunk into sitting edge of bed,  Righting response severly delayed ( unclear if this is due to congnition) Pt presents with multidirectional LOB with mod to max assistance to correct.  pt able to maintain short bouts 3-5sec of sitting balance with min assistance before fatigue sets in or LOB noted.    Transfers Overall transfer level: (deferred OOB transfer as patient is to transfer to surgery later this pm and RN reports to keep patient in bed for transport.  )                   Ambulation/Gait                 Stairs            Wheelchair Mobility    Modified Rankin (Stroke Patients Only)       Balance     Sitting balance-Leahy Scale: Poor       Standing balance-Leahy Scale: Zero                              Cognition Arousal/Alertness: Awake/alert Behavior During Therapy: Flat affect Overall Cognitive Status: Impaired/Different from baseline Area of Impairment: Orientation;Attention;Memory;Following commands;Safety/judgement;Awareness;Problem solving                 Orientation Level: Disoriented to;Time;Place;Situation Current Attention Level: Sustained Memory: Decreased recall of precautions;Decreased short-term memory Following Commands: Follows one step commands inconsistently Safety/Judgement: Decreased awareness of safety;Decreased awareness of deficits Awareness: Intellectual Problem Solving: Slow processing;Decreased initiation;Difficulty sequencing General Comments: Pt has difficulty maintaining focus on task which hinders progression with tx.        Exercises General Exercises - Lower Extremity Quad Sets: AROM;Both;10 reps;Supine Heel Slides: AAROM;Both;10 reps;Supine Hip ABduction/ADduction: AAROM;Both;10 reps;Supine Straight Leg Raises: AAROM;Both;10 reps;Supine    General Comments        Pertinent Vitals/Pain Pain Assessment: Faces Faces Pain Scale: Hurts little more Pain Location: L shoulder, neck, back Pain Descriptors / Indicators: Aching Pain Intervention(s): Monitored during session;Repositioned  Home Living                      Prior Function            PT Goals (current goals can now be found in the care plan section) Acute Rehab PT Goals Patient Stated Goal: wifes goal is for pt to return home with her care Potential to Achieve Goals: Fair Progress towards PT goals: Progressing toward goals    Frequency    Min 3X/week      PT Plan Current plan  remains appropriate    Co-evaluation              AM-PAC PT "6 Clicks" Daily Activity  Outcome Measure  Difficulty turning over in bed (including adjusting bedclothes, sheets and blankets)?: Unable Difficulty moving from lying on back to sitting on the side of the bed? : Unable Difficulty sitting down on and standing up from a chair with arms (e.g., wheelchair, bedside commode, etc,.)?: Unable Help needed moving to and from a bed to chair (including a wheelchair)?: Total Help needed walking in hospital room?: Total Help needed climbing 3-5 steps with a railing? : Total 6 Click Score: 6    End of Session Equipment Utilized During Treatment: Gait belt Activity Tolerance: Patient limited by fatigue Patient left: with call bell/phone within reach;with bed alarm set;in chair;with chair alarm set Nurse Communication: Mobility status;Need for lift equipment PT Visit Diagnosis: Muscle weakness (generalized) (M62.81);Other symptoms and signs involving the nervous system (R29.898);Other abnormalities of gait and mobility (R26.89)     Time: [id number] PT Time Calculation (min) (ACUTE ONLY): 17 min  Charges:  $Therapeutic Activity: 8-22 mins                    G CodesJoycelyn Rua:       Kenise Barraco, PTA pager [phone]    Florestine Aversimee J Levia Waltermire 06/20/2017, 12:00 PM

## 2017-06-20 NOTE — Discharge Instructions (Signed)

## 2017-06-20 NOTE — Sedation Documentation (Signed)
Only giving pain meds as patietn reports drank OJ 1 hour ago.

## 2017-06-20 NOTE — Progress Notes (Signed)
Internal Medicine Attending:   I saw and examined the patient. I reviewed the resident's note and I agree with the resident's findings and plan as documented in the resident's note. To go for internalization of nephrostomy tubes today, otherwise no complaints.  Mild hypoglycemia noted, will decrease SSI to sensitive scale.  We may need to add some mealtime insulin tomorrow if not getting glycemic control.  Otherwise agree with Dr Pennelope BrackenSantos-Schanz's note.

## 2017-06-20 NOTE — Sedation Documentation (Signed)
Medicated for pain 

## 2017-06-21 LAB — CBC
HCT: 31.1 % — ABNORMAL LOW (ref 39.0–52.0)
Hemoglobin: 9.8 g/dL — ABNORMAL LOW (ref 13.0–17.0)
MCH: 29.2 pg (ref 26.0–34.0)
MCHC: 31.5 g/dL (ref 30.0–36.0)
MCV: 92.6 fL (ref 78.0–100.0)
PLATELETS: 333 10*3/uL (ref 150–400)
RBC: 3.36 MIL/uL — AB (ref 4.22–5.81)
RDW: 13 % (ref 11.5–15.5)
WBC: 8.7 10*3/uL (ref 4.0–10.5)

## 2017-06-21 LAB — GLUCOSE, CAPILLARY
GLUCOSE-CAPILLARY: 170 mg/dL — AB (ref 65–99)
GLUCOSE-CAPILLARY: 238 mg/dL — AB (ref 65–99)
Glucose-Capillary: 219 mg/dL — ABNORMAL HIGH (ref 65–99)
Glucose-Capillary: 183 mg/dL — ABNORMAL HIGH (ref 65–99)

## 2017-06-21 LAB — BASIC METABOLIC PANEL
Anion gap: 9 (ref 5–15)
BUN: 11 mg/dL (ref 6–20)
CHLORIDE: 99 mmol/L — AB (ref 101–111)
CO2: 26 mmol/L (ref 22–32)
Calcium: 8.2 mg/dL — ABNORMAL LOW (ref 8.9–10.3)
Creatinine, Ser: 0.84 mg/dL (ref 0.61–1.24)
GFR calc Af Amer: >60 mL/min (ref >60)
GLUCOSE: 172 mg/dL — AB (ref 65–99)
POTASSIUM: 3.5 mmol/L (ref 3.5–5.1)
Sodium: 134 mmol/L — ABNORMAL LOW (ref 135–145)

## 2017-06-21 NOTE — Progress Notes (Signed)
   Subjective:  No acute events overnight. Tolerated internalization of nephrostomy tubes well.  More alert and awake this morning.  Denied any complaints.  Family at bedside.  Discussed plan to discharge to SNF.  Family voiced understanding and is in agreement with plan. Dr. Mayford KnifeWilliams (with [facility]) made aware that patient is being discharged by Dr. Caron PresumeHelberg. Medications list discussed with her.   Objective:  Vital signs in last 24 hours: Vitals:   06/20/17 1941 06/21/17 0011 06/21/17 0429 06/21/17 0846  BP: 138/81 111/63 113/60 110/71  Pulse: 71  80 79  Resp: 18 17 19  (!) 22  Temp: 98.3 F (36.8 C) 97.7 F (36.5 C) (!) 97.5 F (36.4 C)   TempSrc: Oral Axillary Axillary   SpO2: 100% 99% 96% 97%  Weight:      Height:       General: Pleasant male, appears chronically ill, malnourished, lying in bed in no acute distress, more talkative this AM  CV: regular rate and rhythm, nl S1/S2, no murmurs, rubs or gallops  Pulm: CTAB, no wheezes or crackles, no increased work of breathing  Abd: soft, NTND, normoactive bowel sounds Neuro: A&Ox1, no focal deficits noted Ext: warm and well perfused, no peripheral edema bilaterally, s/p L AKA   Assessment/Plan:  Alan [name] is a 71 y.o male with severe vascular disease and vascular dementia who presented to the ED with altered mental status. He was found to be septic and work-up reveal Coag negative staphylococcusbacteremia and Fungemia(Candida Tropicalis).   # Coag negative staphylococcusbacteremia and Fungemia(Candida Tropicalis): secondary to obstructive nephrolithiasis. Now s/p bilateral nephrostomy tubes with internalization 12/28. Tolerated procedure well and feeling well this morning. Bcx 12/23 with NG at 5 days. On Vancomycin and Eraxis ID recommended 14 days of antimicrobial therapy. Ready for discharge pending SNF placement  - Per IR, PCNs to staty capped until removal as outpatient follow up in 2-4 weeks. May uncap if patients develops  flank pain.  - Follow up with SW regarding SNF placement, left VM   # Loose Stools. Patient has a history of frequent loose BMs that worsened acutely in the setting of antibiotic use. C. Diff negative. The patient symptoms and stool consistence are not consistent with infectious etiology. It is likely that the patient had exacerbation of his normal bowel habits due to the antimicrobial regimen used. - Continue Loperamide 4 mg q6h PRN    # Type 2 DM:  -CBG q4 withS-SSI - Lantus 40 QHS  - Will follow CBG with meals today and add meal coverage   # Hypokalemia:  - Continue to monitor and replete PRN    # Normocytic Anemia: Hgb 10.1today which has slowly trended down from baseline of 11-12 - Continue to monitor daily. Likely suppressed production in the setting of systemic infection  # Oral Thrush  - Nystatin oral solution ordered.   Dispo: Anticipated discharge in approximately 1-2 days pending SNF placement.   Alan [name], Alan Ruder, MD 06/21/2017, 10:52 AM Pager: [phone]

## 2017-06-21 NOTE — Progress Notes (Signed)
Medicine attending: Clinical status and database reviewed with resident physician Dr. Lovenia KimSantos-Sanchez and I concur with her evaluation and management plan which we discussed together. Elderly patient recovering from coagulase-negative staphylococcal and Candida sepsis  currently waiting for nursing home availability.  He required placement of bilateral nephrostomy tubes to relieve obstruction by stones.  He remains on antibiotic therapy with vancomycin and Eraxis for planned 14-day course.

## 2017-06-21 NOTE — Progress Notes (Signed)
Occupational Therapy Treatment Patient Details Name: Alan SeedsGlenn A Sloan Jr. MRN: [id number] DOB: January 18, 1946 Today's Date: 06/21/2017    History of present illness pt is a 71 y/o male with pmh significant for DM, CAD, dementia, CVA, PAD s/p L BKA and R transmet amputation and alcohol use d/o , presenting with sepsis.  Work up findings includ bacteremia due to ascending obstructive pyelonephritis, s/p nephrostomy tubes 12/20.   OT comments  Pt. Seen for introduction of compensatory techniques for increasing safety and independence with self feeding.    Follow Up Recommendations  SNF;Supervision/Assistance - 24 hour;Other (comment)    Equipment Recommendations  None recommended by OT    Recommendations for Other Services      Precautions / Restrictions Precautions Precautions: Fall Precaution Comments: drainage tubes; at risk for skin breakdown Restrictions Weight Bearing Restrictions: No Other Position/Activity Restrictions: per wife L wrist is fractured.  no order in chart, patient has L wrist splint       Mobility Bed Mobility                  Transfers                      Balance                                           ADL either performed or assessed with clinical judgement   ADL Overall ADL's : Needs assistance/impaired Eating/Feeding: Supervision/ safety;Cueing for safety;Cueing for compensatory techinques Eating/Feeding Details (indicate cue type and reason): Pt. noted to have signage for honey thick liquids but found to have multiple cans of diet mt. dew in the room and was actively drinking mt. dew out of can upon arrival.  able to bring r ue to mouth with can with no spillage noted.  states his left hand/arm do not work at all.  reviewed options for how to manage food that needs to be cut, as he reports having only use of r hand.  he sates "i just dont" not able to understand if he doesnt cut food or doesnt eat food that needs to be  cut.  noted coughing at least 3 times after swallowing soda.  reviewed about the honey thick recommendations.  he just nodded.  showed him the thickner.  reviewed it may be why he is coughing if he does not use the appropriate thickness.  he stared only and would not respond.  asked if i could raise HOB to aide in upright position in hopes to allow for better swallowing.  he reluctantly agreed.                                           Vision       Perception     Praxis      Cognition Arousal/Alertness: Awake/alert Behavior During Therapy: Flat affect Overall Cognitive Status: Impaired/Different from baseline Area of Impairment: Orientation;Attention;Memory;Following commands;Safety/judgement;Awareness;Problem solving                 Orientation Level: Disoriented to;Time;Place;Situation Current Attention Level: Sustained Memory: Decreased recall of precautions;Decreased short-term memory Following Commands: Follows one step commands inconsistently;Follows one step commands with increased time Safety/Judgement: Decreased awareness of safety;Decreased awareness of deficits Awareness: Intellectual Problem Solving: Slow processing;Decreased  initiation;Difficulty sequencing General Comments: Pt has difficulty maintaining focus on task which hinders progression with tx.          Exercises     Shoulder Instructions       General Comments      Pertinent Vitals/ Pain       Pain Assessment: No/denies pain  Home Living                                          Prior Functioning/Environment              Frequency           Progress Toward Goals  OT Goals(current goals can now be found in the care plan section)  Progress towards OT goals: Progressing toward goals     Plan      Co-evaluation                 AM-PAC PT "6 Clicks" Daily Activity     Outcome Measure   Help from another person eating meals?: A  Lot Help from another person taking care of personal grooming?: A Lot Help from another person toileting, which includes using toliet, bedpan, or urinal?: Total Help from another person bathing (including washing, rinsing, drying)?: A Lot Help from another person to put on and taking off regular upper body clothing?: Total Help from another person to put on and taking off regular lower body clothing?: Total 6 Click Score: 9    End of Session    OT Visit Diagnosis: Muscle weakness (generalized) (M62.81);Other symptoms and signs involving cognitive function;Other abnormalities of gait and mobility (R26.89)   Activity Tolerance Patient tolerated treatment well   Patient Left in bed;with call bell/phone within reach;with bed alarm set   Nurse Communication          Time: [id number] OT Time Calculation (min): 8 min  Charges: OT General Charges $OT Visit: 1 Visit OT Treatments $Self Care/Home Management : 8-22 mins   Robet LeuMorris, Delia Slatten Lorraine, COTA/L 06/21/2017, 11:55 AM

## 2017-06-22 DIAGNOSIS — A411 Sepsis due to other specified staphylococcus: Principal | ICD-10-CM

## 2017-06-22 DIAGNOSIS — M270 Developmental disorders of jaws: Secondary | ICD-10-CM

## 2017-06-22 LAB — GLUCOSE, CAPILLARY
GLUCOSE-CAPILLARY: 79 mg/dL (ref 65–99)
GLUCOSE-CAPILLARY: 194 mg/dL — AB (ref 65–99)
Glucose-Capillary: 90 mg/dL (ref 65–99)
Glucose-Capillary: 264 mg/dL — ABNORMAL HIGH (ref 65–99)

## 2017-06-22 MED ORDER — LORAZEPAM 2 MG/ML IJ SOLN: 0.5000 mg | Freq: Every evening | INTRAMUSCULAR | Status: DC | PRN

## 2017-06-22 MED ORDER — LORAZEPAM 2 MG/ML IJ SOLN
0.5000 mg | Freq: Every evening | INTRAMUSCULAR | Status: DC | PRN
  Administered 2017-06-22: 0.5 mg via INTRAVENOUS
  Filled 2017-06-22: qty 1

## 2017-06-22 MED ORDER — LORAZEPAM 2 MG/ML IJ SOLN
0.5000 mg | Freq: Once | INTRAMUSCULAR | Status: AC
  Administered 2017-06-22: 0.5 mg via INTRAVENOUS
  Filled 2017-06-22: qty 1

## 2017-06-22 MED ORDER — ALTEPLASE 2 MG IJ SOLR: 2.0000 mg | Freq: Once | INTRAMUSCULAR | Status: DC

## 2017-06-22 MED ORDER — ALTEPLASE 2 MG IJ SOLR
2.0000 mg | Freq: Once | INTRAMUSCULAR | Status: AC
  Administered 2017-06-22: 2 mg

## 2017-06-22 NOTE — Progress Notes (Signed)
Pharmacy Antibiotic Note  Alan SeedsGlenn A Gibas Jr. is a 71 y.o. male continues on vancomycin and anidulafungin for CONS and candida tropicalis bacteremia and sepsis.    AKI on admit, now resolved. Vancomycin dose was changed to 1500mg  q24 for ease of administration as patient likely to be discharged prior to completing therapy. Per ID, will continue antibiotics through 06/29/17.   Plan: 1) Continue vancomycin 1500mg  IV q24, check trough 12/31 prior to 4th dose 2) Continue anidulafungin 100mg  IV q24  Outpatient parenteral antibiotic orders pended for MD to sign (vancomycin and anidulafungin through 06/29/17)   Height: 6' (182.9 cm) Weight: 166 lb 7.2 oz (75.5 kg) IBW/kg (Calculated) : 77.6  Temp (24hrs), Avg:98 F (36.7 C), Min:97.5 F (36.4 C), Max:98.3 F (36.8 C)  Recent Labs  Lab 06/17/17 0331 06/17/17 1203 06/18/17 0351 06/19/17 0402 06/20/17 0425 06/21/17 1550  WBC 9.1  --  8.6 8.5 9.7 8.7  CREATININE 0.89  --  0.76 0.65 0.71 0.84  VANCOTROUGH  --  23*  --   --   --   --     Estimated Creatinine Clearance: 88.6 mL/min (by C-G formula based on SCr of 0.84 mg/dL).      Alan [name], PharmD, BCPS 06/22/2017 12:04 PM

## 2017-06-22 NOTE — Clinical Social Work Note (Addendum)
According to weekday unit CSW handoff, patient has a bed at [facility] when stable for discharge.  Charlynn CourtSarah Avana Kreiser, CSW [phone]  11:26 am Per MD, patient is stable for discharge today. CSW left voicemail with admissions coordinator to confirm plan. Will update MD with response. DNR on chart for MD to sign.  Charlynn CourtSarah Morayo Leven, CSW [phone]  12:45 pm CSW send two messages to admissions phone. CSW spoke with staff member at the facility and they said they are not aware of an admission today and had no admissions staff in the building.  Charlynn CourtSarah Sharyah Bostwick, CSW [phone]  1:28 pm Received response from admissions coordinator. They can take patient today. CSW paged MD.  Charlynn CourtSarah Tesslyn Baumert, CSW [phone]  1:54 pm Per MD, patient will discharge to SNF tomorrow. SNF notified.  Charlynn CourtSarah Kearra Calkin, CSW [phone]

## 2017-06-22 NOTE — Progress Notes (Signed)
New orders received. MBS planned for 12/31 in am.   Ferdinand LangoLeah Kensleigh Gates MA, CCC-SLP [phone]

## 2017-06-22 NOTE — Plan of Care (Signed)
  Clinical Measurements: Signs and symptoms of infection will decrease 06/22/2017 0150 - Progressing by Jill SideNiemela, Jasneet Schobert R, RN   Education: Knowledge of General Education information will improve 06/22/2017 0150 - Not Progressing by Jill SideNiemela, Bueford Arp R, RN

## 2017-06-22 NOTE — Progress Notes (Signed)
Patient restless. Removed condom cath twice and was scratching at his dressings. Dressings changed, condom cath applied. Order for ativan completed. Patient resting. Will continue to monitor.

## 2017-06-22 NOTE — Progress Notes (Signed)
Medicine attending: I examined this patient today together with resident physician Dr. Lovenia KimSantos-Sanchez and I concur with her evaluation and management plan which we discussed together. He remains afebrile on antibacterial and antifungal therapy status post bilateral nephrostomy placement for obstructing renal stones with attendant bacterial and fungal septicemia. He remains withdrawn and not verbally communicative but does appear to understand questions. Torus palatinus.  No oropharyngeal exudate.  Lungs overall clear.  Regular cardiac rhythm no murmur.  Abdomen soft and nontender.  Left BKA.  No right lower extremity edema. Impression: Stable day 11 vancomycin, day 7 anidulafungin with planned 14-day course which may be completed as an outpatient when skilled nursing facility bed is available.

## 2017-06-22 NOTE — Progress Notes (Signed)
   Subjective:  No acute events overnight.  Not very talkative this morning but doing well.  Denied any complaints this morning.  No family at bedside today.  Stable for discharge.  Has a bed at [facility].    Objective:  Vital signs in last 24 hours: Vitals:   06/21/17 1614 06/21/17 1950 06/22/17 0347 06/22/17 0800  BP: 132/73 135/64 (!) 144/71 (!) 110/57  Pulse: 79 80 74 74  Resp: 20 19 19    Temp: 98.1 F (36.7 C) 98.3 F (36.8 C) 98.1 F (36.7 C) (!) 97.5 F (36.4 C)  TempSrc: Oral Oral Oral Axillary  SpO2: 99% 99% 97% 99%  Weight:   166 lb 7.2 oz (75.5 kg)   Height:       General: Elderly male that appears chronically ill, lying in bed in no acute distress, minimally talkative, nods head when responding to questions CV: Regular rate and rhythm, normal S1/S2, no murmurs, rubs or gallops Pulm: CTAB, no wheezes or crackles, no increased work of breathing Abd: Soft, nontender, nondistended, normoactive bowel sounds Neuro: Not talkative this morning but arousable Ext: Warm and well perfused, no peripheral edema bilaterally, s/p L BKA  Assessment/Plan:  Alan [name] is a 71 y.o male with severe vascular disease and vascular dementia who presented to the ED with altered mental status. He was found to be septic and work-up reveal Coag negative staphylococcusbacteremia and Fungemia(Candida Tropicalis).   # Coag negative staphylococcusbacteremia and Fungemia(Candida Tropicalis): secondary to obstructive nephrolithiasis. Now s/p bilateral nephrostomy tubes with internalization 12/28. Bcx 12/23 with NG at 5 days. On Vancomycin and Eraxis ID recommended 14 days of antimicrobial therapy. [facility]. - Per IR, PCNs to staty capped until removal as outpatient follow up in 2-4 weeks. May uncap if patients develops flank pain.  - Bed available at [facility], SW to contact me regarding if transportation available today    # Loose Stools. Improving  - Continue  Loperamide 4 mg q6h PRN    # Type 2 DM:  -CBG q4 withS-SSI - Lantus 40 QHS  - Will follow CBG with meals today and add meal coverage    # Normocytic Anemia: stable  # Oral Thrush  - D/c nystatin as he is on an IV antifungal agent    Dispo: Anticipated discharge in approximately 1-2 days pending SNF placement.   Burna CashSantos-Sanchez, Allysha Tryon, MD 06/22/2017, 11:23 AM Pager: [phone]

## 2017-06-23 ENCOUNTER — Inpatient Hospital Stay (HOSPITAL_COMMUNITY): Payer: Medicare (Managed Care)

## 2017-06-23 LAB — VANCOMYCIN, TROUGH: VANCOMYCIN TR: 21 ug/mL — AB (ref 15–20)

## 2017-06-23 LAB — GLUCOSE, CAPILLARY
GLUCOSE-CAPILLARY: 117 mg/dL — AB (ref 65–99)
GLUCOSE-CAPILLARY: 147 mg/dL — AB (ref 65–99)
Glucose-Capillary: 68 mg/dL (ref 65–99)

## 2017-06-23 MED ORDER — ANIDULAFUNGIN IV (FOR PTA / DISCHARGE USE ONLY): 100.0000 mg | INTRAVENOUS | 0 refills | Status: DC

## 2017-06-23 MED ORDER — LORAZEPAM 1 MG PO TABS: 0.5000 mg | ORAL_TABLET | Freq: Two times a day (BID) | ORAL | 0 refills | Status: DC

## 2017-06-23 MED ORDER — NYSTATIN 100000 UNIT/ML MT SUSP: 5.0000 mL | Freq: Four times a day (QID) | OROMUCOSAL | 1 refills | Status: AC

## 2017-06-23 MED ORDER — VANCOMYCIN HCL 10 G IV SOLR: 1250.0000 mg | INTRAVENOUS | Status: DC

## 2017-06-23 MED ORDER — VANCOMYCIN IV (FOR PTA / DISCHARGE USE ONLY): 1250.0000 mg | INTRAVENOUS | 0 refills | Status: DC

## 2017-06-23 MED ORDER — LOPERAMIDE HCL 2 MG PO CAPS: 4.0000 mg | ORAL_CAPSULE | Freq: Four times a day (QID) | ORAL | 0 refills | Status: DC | PRN

## 2017-06-23 MED ORDER — RESOURCE THICKENUP CLEAR PO POWD
ORAL | Status: DC | PRN
  Filled 2017-06-23: qty 125

## 2017-06-23 NOTE — Progress Notes (Signed)
Modified Barium Swallow Progress Note  Patient Details  Name: Margaretha SeedsGlenn A Gambill Jr. MRN: [id number] Date of Birth: 03/06/1946  Today's Date: 06/23/2017  Modified Barium Swallow completed.  Full report located under Chart Review in the Imaging Section.  Brief recommendations include the following:  Clinical Impression  Although it is recommended pt continue honey thick liquids, swallow function has mildly improved marked by ability to initiate a swallow with each trial (unlike prior MBS) and initiation was delayed but faster. Larger sips of nectar thick barium were grossly aspirated without immediate or adequate sensation. When cued for smaller sips nectar he complied 50% of the time, therefore recommend he continue honey thick liquids with continued ST at SNF to practice small sips with nectar and upgrading if/when able. Prolonged mastication with Dys 2 (graham cracker in applesauce) and continue Dys 1 (puree), small sips and purposeful throat clear and cough throughout meals.      Swallow Evaluation Recommendations       SLP Diet Recommendations: Dysphagia 1 (Puree) solids;Honey thick liquids   Liquid Administration via: Cup   Medication Administration: Crushed with puree   Supervision: Patient able to self feed   Compensations: Minimize environmental distractions;Slow rate;Small sips/bites   Postural Changes: Seated upright at 90 degrees   Oral Care Recommendations: Oral care BID        Royce MacadamiaLitaker, Onyx Schirmer Willis 06/23/2017,1:29 PM   Breck CoonsLisa Willis HiddeniteLitaker M.Ed [employer] Pager [phone]

## 2017-06-23 NOTE — Progress Notes (Signed)
[facility] will [facility] Anticipated discharge date: 06/23/17 Family notified: Lanae CrumblySheila Stafford, daughter Transportation by: Sharin MonsPTAR  Nurse to call report to [phone].   CSW signing off.  Abigail ButtsSusan Wylene Weissman, LCSWA  Clinical Social Worker

## 2017-06-23 NOTE — Care Management Note (Signed)
Case Management Note Donn PieriniKristi Jeff Frieden RN, BSN Unit 4E-Case Manager [phone]  Patient Details  Name: Alan SeedsGlenn A Brent Jr. MRN: [id number] Date of Birth: Jul 04, 1945  Subjective/Objective:  Pt admitted with sepsis                  Action/Plan: PTA pt lived at home with wife- active with the [facility] program- PT/OT evals pending- pt may need SNF placement (pt's family have been trying to place pt into SNF prior to admission) - CSW consulted-  [facility] CSW contactCarmon Sails- Emily Scearce- [phone]  Expected Discharge Date:  06/23/17               Expected Discharge Plan:  Skilled Nursing Facility  In-House Referral:  Clinical Social Work  Discharge planning Services  CM Consult  Post Acute Care Choice:  NA Choice offered to:  NA  DME Arranged:    DME Agency:     HH Arranged:    HH Agency:     Status of Service:  Completed, signed off  If discussed at [employer] Length of Stay Meetings, dates discussed:    Discharge Disposition: skilled facility   Additional Comments:  06/23/17- 1100- Levin Dagostino RN, CM- pt for d/c today to SNF- CSW following for placement needs  Darrold SpanWebster, Ilhan Madan Hall, RN 06/23/2017, 11:11 AM

## 2017-06-23 NOTE — Progress Notes (Signed)
Pharmacy Antibiotic Note  Alan SeedsGlenn A Gehret Jr. is a 71 y.o. male continues on vancomycin and anidulafungin for CONS and candida tropicalis bacteremia and sepsis.    AKI on admit, now resolved. Vancomycin dose was changed to q24 hour for ease of administration as patient likely to be discharged prior to completing therapy. Per ID, will continue antibiotics through 06/29/17. Vancomycin trough 21 this morning on 1500mg  q24 hour regimen.   Plan: 1) Reduce vancomycin to 1250mg  IV q24 2) Continue anidulafungin 100mg  IV q24  Outpatient parenteral antibiotic orders pended for MD to sign (vancomycin and anidulafungin through 06/29/17)   Height: 6' (182.9 cm) Weight: 166 lb 7.2 oz (75.5 kg) IBW/kg (Calculated) : 77.6  Temp (24hrs), Avg:98.2 F (36.8 C), Min:97.7 F (36.5 C), Max:98.7 F (37.1 C)  Recent Labs  Lab 06/17/17 0331 06/17/17 1203 06/18/17 0351 06/19/17 0402 06/20/17 0425 06/21/17 1550 06/23/17 0512  WBC 9.1  --  8.6 8.5 9.7 8.7  --   CREATININE 0.89  --  0.76 0.65 0.71 0.84  --   VANCOTROUGH  --  23*  --   --   --   --  21*    Estimated Creatinine Clearance: 88.6 mL/min (by C-G formula based on SCr of 0.84 mg/dL).    Alan [name], PharmD Clinical Pharmacist 06/23/2017 8:13 AM

## 2017-06-23 NOTE — Clinical Social Work Placement (Signed)
   CLINICAL SOCIAL WORK PLACEMENT  NOTE  Date:  06/23/2017  Patient Details  Name: Alan SeedsGlenn A Lugar Jr. MRN: [id number] Date of Birth: [birth year]  Clinical Social Work is seeking post-discharge placement for this patient at the Skilled  Nursing Facility level of care (*CSW will initial, date and re-position this form in  chart as items are completed):  Yes   Patient/family provided with [place] Clinical Social Work Department's list of facilities offering this level of care within the geographic area requested by the patient (or if unable, by the patient's family).  Yes   Patient/family informed of their freedom to choose among providers that offer the needed level of care, that participate in Medicare, Medicaid or managed care program needed by the patient, have an available bed and are willing to accept the patient.  Yes   Patient/family informed of [name] ownership interest in [facility] and [facility], as well as of the fact that they are under no obligation to receive care at these facilities.  PASRR submitted to EDS on 06/20/17     PASRR number received on 06/20/17     Existing PASRR number confirmed on       FL2 transmitted to all facilities in geographic area requested by pt/family on 06/20/17     FL2 transmitted to all facilities within larger geographic area on       Patient informed that his/her managed care company has contracts with or will negotiate with certain facilities, including the following:  [facility]     Yes   Patient/family informed of bed offers received.  Patient [facility] bed at [facility]     Physician recommends and patient chooses bed at      Patient to be [facility] to [facility] on 06/23/17.  Patient to be transferred to facility by PTAR     Patient family notified on 06/23/17 of transfer.  Name of family member notified:        PHYSICIAN Please prepare priority  discharge summary, including medications, Please prepare prescriptions, Please sign DNR     Additional Comment:    _______________________________________________ Abigail ButtsSusan Shadae Reino, LCSW 06/23/2017, 10:31 AM

## 2017-06-23 NOTE — Progress Notes (Signed)
   Subjective:  No acute events overnight. This morning patient was sitting up in bed eating breakfast with RN assistance. Stated he was feeling well and had no complaints this AM. No family present at bedside, but I did speak with patient's wife yesterday and she is aware he will be discharge to SNF today after swallow eval.   Objective:  Vital signs in last 24 hours: Vitals:   06/22/17 1957 06/22/17 2359 06/23/17 0000 06/23/17 0403  BP: 120/64 123/73 123/73 124/65  Pulse:      Resp: 17 18  18   Temp: 98.7 F (37.1 C) 98.3 F (36.8 C)  97.7 F (36.5 C)  TempSrc: Oral Oral  Oral  SpO2: 97% 98%  97%  Weight:      Height:       General: elderly male who appears chronically ill, alert, sitting up in bed eating breakfast, in no acute distress  Cardiac: regular rate and rhythm, nl S1/S2, no murmurs, rubs or gallops  Pulm: CTAB, no wheezes or crackles, no increased work of breathing  Abd: soft, NTND, bowel sounds present   Neuro: A&Ox1, does not answer questions appropriately  Ext: warm and well perfused, no peripheral edema   Assessment/Plan:  Mr. Willeen CassBennett is a 71 y.o male with severe vascular disease and vascular dementia who presented to the ED with altered mental status. He was found to be septic and work-up reveal Coag negative staphylococcusbacteremia and Fungemia(Candida Tropicalis).  #Coag negative staphylococcusbacteremia and Fungemia(Candida Tropicalis):secondary to obstructive nephrolithiasis.Now s/p bilateral nephrostomy tubes with internalization 12/28. Bcx 12/23 with NG at 5 days. On Vancomycin and EraxisID recommended14 days of antimicrobial therapy.[facility]. - Per IR, PCNs to staty capped until removal as outpatient follow up in 2-4 weeks. May uncap if patients develops flank pain.  - Continue Vancomycin and Eraxis until 1/5 - Discharge to SNF today after swallow evaluation    #Loose Stools. Improving  - Continue Loperamide 4 mg  q6h PRN   # Type 2 DM: -CBG q4 withS-SSI - Lantus 40 QHS  - Will follow CBG with meals today and add meal coverage   #Oral Thrush  - Eraxis as above    Dispo: Anticipated discharge today.   Burna CashSantos-Sanchez, Marc Sivertsen, MD 06/23/2017, 6:39 AM Pager: [phone]

## 2017-06-23 NOTE — Progress Notes (Signed)
CRITICAL VALUE ALERT  Critical Value:  Vanc trough  Date & Time Notied:  06/23/2017 @ 0640  Provider Notified: main pharmacy staff  Orders Received/Actions taken: no new orders.

## 2017-06-24 ENCOUNTER — Other Ambulatory Visit: Payer: Self-pay

## 2017-06-24 ENCOUNTER — Encounter (HOSPITAL_COMMUNITY): Payer: Self-pay | Admitting: Emergency Medicine

## 2017-06-24 ENCOUNTER — Emergency Department (HOSPITAL_COMMUNITY)
Admission: EM | Admit: 2017-06-24 | Discharge: 2017-06-25 | Disposition: A | Discharge status: Home / Self Care | Payer: Medicare (Managed Care) | Attending: Emergency Medicine | Admitting: Emergency Medicine

## 2017-06-24 DIAGNOSIS — I252 Old myocardial infarction: Secondary | ICD-10-CM | POA: Diagnosis not present

## 2017-06-24 DIAGNOSIS — Z87891 Personal history of nicotine dependence: Secondary | ICD-10-CM | POA: Diagnosis not present

## 2017-06-24 DIAGNOSIS — Z79899 Other long term (current) drug therapy: Secondary | ICD-10-CM | POA: Diagnosis not present

## 2017-06-24 DIAGNOSIS — F0390 Unspecified dementia without behavioral disturbance: Secondary | ICD-10-CM | POA: Insufficient documentation

## 2017-06-24 DIAGNOSIS — Z452 Encounter for adjustment and management of vascular access device: Secondary | ICD-10-CM | POA: Diagnosis not present

## 2017-06-24 DIAGNOSIS — Z7982 Long term (current) use of aspirin: Secondary | ICD-10-CM | POA: Diagnosis not present

## 2017-06-24 DIAGNOSIS — E119 Type 2 diabetes mellitus without complications: Secondary | ICD-10-CM | POA: Diagnosis not present

## 2017-06-24 DIAGNOSIS — T82898A Other specified complication of vascular prosthetic devices, implants and grafts, initial encounter: Secondary | ICD-10-CM

## 2017-06-24 DIAGNOSIS — Z794 Long term (current) use of insulin: Secondary | ICD-10-CM | POA: Insufficient documentation

## 2017-06-24 MED ORDER — SODIUM CHLORIDE 0.9% FLUSH
10.0000 mL | Freq: Two times a day (BID) | INTRAVENOUS | Status: DC
  Administered 2017-06-24: 10 mL

## 2017-06-24 MED ORDER — ALTEPLASE 2 MG IJ SOLR
2.0000 mg | Freq: Once | INTRAMUSCULAR | Status: AC
  Administered 2017-06-24: 2 mg
  Filled 2017-06-24: qty 2

## 2017-06-24 MED ORDER — SODIUM CHLORIDE 0.9% FLUSH: 10.0000 mL | INTRAVENOUS | Status: DC | PRN

## 2017-06-24 NOTE — ED Triage Notes (Signed)
Pt brought in by EMS from [facility] because the staff there states pt's PIC line will not flush  [facility] with a diagnosis of sepsis  Pt has dementia  [facility] is to [facility] until Jan [facility]  Pt has nephrostomy tube on left

## 2017-06-24 NOTE — ED Provider Notes (Signed)
[facility] DEPT Provider Note: Alan Spurling, MD, FACEP  CSN: [id number] MRN: [id number] ARRIVAL: 06/24/17 at 2039 ROOM: [place]  Vascular Access Problem  Level 5 Caveat: dementia. HISTORY OF PRESENT ILLNESS  06/24/17 11:17 PM Alan Linden. is a 72 y.o. male who was discharged home from [facility] to a nursing facility yesterday after hospitalization for staph bacteremia and fungemia.  He also has a right nephrostomy tube.  He was to receive daily infusions of vancomycin and Eraxis by a PICC line in his right upper arm.  He was sent to the ED from his facility because of a nonfunctioning PICC line.  The IV team was consulted by nursing staff on arrival and they have infused TPA into his PICC line.  It is now awaiting reassessment.   He did receive his dose of vancomycin today.  It is not clear if he has had his Eraxis.  The patient is in no distress and has been calm and cooperative with staff.  Vital signs were normal on arrival.   Past Medical History:  Diagnosis Date  . Anemia   . Arthritis   . Dementia   . Diabetes mellitus without complication ([place])   . ETOH abuse   . Myocardial infarction ([place])   . Peripheral arterial disease ([place])   . Stroke ([place])   . Wears dentures   . Wears glasses     Past Surgical History:  Procedure Laterality Date  . ABDOMINAL AORTOGRAM N/A 10/25/2016   Procedure: Abdominal Aortogram;  Surgeon: Elam Dutch, MD;  Location: [facility];  Service: Cardiovascular;  Laterality: N/A;  . BRAIN SURGERY    . CORONARY STENT PLACEMENT     " about 25 years ago (10/29/16)  . ENDARTERECTOMY FEMORAL Right 10/30/2016   Procedure: ENDARTERECTOMY FEMORAL-RIGHT WITH PATCH GRAFT;  Surgeon: Elam Dutch, MD;  Location: [facility];  Service: Vascular;  Laterality: Right;  . IR NEPHROSTOMY EXCHANGE LEFT  06/20/2017  . IR NEPHROSTOMY EXCHANGE RIGHT  06/20/2017  . IR NEPHROSTOMY PLACEMENT LEFT  06/12/2017  . IR NEPHROSTOMY  PLACEMENT RIGHT  06/12/2017  . IR URETERAL STENT PLACEMENT EXISTING ACCESS LEFT  06/20/2017  . IR URETERAL STENT PLACEMENT EXISTING ACCESS RIGHT  06/20/2017  . LOWER EXTREMITY ANGIOGRAPHY Right 10/25/2016   Procedure: Lower Extremity Angiography;  Surgeon: Elam Dutch, MD;  Location: [facility];  Service: Cardiovascular;  Laterality: Right;    Family History  Problem Relation Age of Onset  . Diabetes Mother   . Heart disease Father   . Heart disease Brother     Social History   Tobacco Use  . Smoking status: Former Smoker    Types: Cigarettes  . Smokeless tobacco: Never Used  . Tobacco comment: Quit smoking cigarettes 10/ 2017  Substance Use Topics  . Alcohol use: No    Comment: former  . Drug use: No    Prior to Admission medications   Medication Sig Start Date End Date Taking? Authorizing Provider  acetaminophen (TYLENOL) 650 MG CR tablet Take 650 mg by mouth 3 (three) times daily.     [provider]  anidulafungin (ERAXIS) IVPB Inject 100 mg into the vein daily. Indication: candidemia Last Day of Therapy:  06/29/17 Labs - Once weekly:  CBC/D and BMP, Labs - Every other week:  ESR and CRP 06/23/17   [name], Alan Morse, MD  aspirin EC 81 MG tablet Take 81 mg by mouth daily.  [provider]  atorvastatin (LIPITOR) 10 MG tablet Take 10 mg by mouth every evening.    [provider]  fluticasone (FLONASE) 50 MCG/ACT nasal spray Place 1 spray into the nose daily as needed for rhinitis.  01/01/16 12/31/16  [provider]  insulin glargine (LANTUS) 100 UNIT/ML injection Inject 40 Units into the skin at bedtime.  06/11/16   [provider]  lidocaine (LIDODERM) 5 % Place 1 patch onto the skin daily. Remove & Discard patch within 12 hours or as directed by MD    [provider]  loperamide (IMODIUM) 2 MG capsule Take 2 capsules (4 mg total) by mouth every 6 (six) hours as needed for diarrhea or loose stools.  06/23/17   [name], Alan Morse, MD  LORazepam (ATIVAN) 1 MG tablet Take 0.5 tablets (0.5 mg total) by mouth 2 (two) times daily. 06/23/17   Welford Roche, MD  Melatonin 10 MG TABS Take 10 mg by mouth at bedtime.    [provider]  metFORMIN (GLUCOPHAGE) 500 MG tablet Take 500 mg by mouth 2 (two) times daily with a meal.    [provider]  nystatin (MYCOSTATIN) 100000 UNIT/ML suspension Take 5 mLs (500,000 Units total) by mouth 4 (four) times daily for 6 days. 06/23/17 06/29/17  Welford Roche, MD  sertraline (ZOLOFT) 50 MG tablet Take 50 mg by mouth 2 (two) times daily.     [provider]  tamsulosin (FLOMAX) 0.4 MG CAPS capsule Take 0.4 mg by mouth every evening.    [provider]  traZODone (DESYREL) 50 MG tablet TAKE ONE TABLET (50 MG TOTAL) BY MOUTH AT BEDTIME. 09/11/16   [provider]  vancomycin IVPB Inject 1,250 mg into the vein daily for 10 days. Indication:  bacteremia Last Day of Therapy:  06/29/17 Labs - Sunday/Monday:  CBC/D, BMP, and vancomycin trough. Labs - Thursday:  BMP and vancomycin trough Labs - Every other week:  ESR and CRP 06/23/17 07/03/17  Welford Roche, MD    Allergies Penicillins   REVIEW OF SYSTEMS     PHYSICAL EXAMINATION  Initial Vital Signs Blood pressure 121/69, pulse 99, temperature 98.7 F (37.1 C), temperature source Oral, resp. rate 17, SpO2 97 %.  Examination General: Well-developed, well-nourished male in no acute distress; appearance consistent with age of record HENT: normocephalic; atraumatic Eyes: pupils equal, round and reactive to light Neck: supple Heart: regular rate and rhythm Lungs: clear to auscultation bilaterally Abdomen: soft; nondistended; nontender; no masses or hepatosplenomegaly; bowel sounds present Back: Right nephrostomy tube Extremities: Left BKA; no edema; PICC line right upper arm Neurologic: Awake, alert; motor function intact in all  extremities; no facial droop Skin: Warm and dry   RESULTS  Summary of this visit's results, reviewed by myself:   EKG Interpretation  Date/Time:    Ventricular Rate:    PR Interval:    QRS Duration:   QT Interval:    QTC Calculation:   R Axis:     Text Interpretation:        Laboratory Studies: No results found for this or any previous visit (from the past 24 hour(s)). Imaging Studies: Dg Swallowing Func-speech Pathology  Result Date: 06/23/2017 Objective Swallowing Evaluation: Type of Study: MBS-Modified Barium Swallow Study  Patient Details Name: Alan Bord. MRN: [id number] Date of Birth: 1945-11-12 Today's Date: 06/23/2017 Time: SLP Start Time (ACUTE ONLY): 1032 -SLP Stop Time (ACUTE ONLY): 1045 SLP Time Calculation (min) (ACUTE ONLY): 13 min Past Medical History: Past Medical  History: Diagnosis Date . Anemia  . Arthritis  . Dementia  . Diabetes mellitus without complication ([place])  . ETOH abuse  . Myocardial infarction ([place])  . Peripheral arterial disease ([place])  . Stroke ([place])  . Wears dentures  . Wears glasses  Past Surgical History: Past Surgical History: Procedure Laterality Date . ABDOMINAL AORTOGRAM N/A 10/25/2016  Procedure: Abdominal Aortogram;  Surgeon: Elam Dutch, MD;  Location: [facility];  Service: Cardiovascular;  Laterality: N/A; . BRAIN SURGERY   . CORONARY STENT PLACEMENT    " about 25 years ago (10/29/16) . ENDARTERECTOMY FEMORAL Right 10/30/2016  Procedure: ENDARTERECTOMY FEMORAL-RIGHT WITH PATCH GRAFT;  Surgeon: Elam Dutch, MD;  Location: [facility];  Service: Vascular;  Laterality: Right; . IR NEPHROSTOMY EXCHANGE LEFT  06/20/2017 . IR NEPHROSTOMY EXCHANGE RIGHT  06/20/2017 . IR NEPHROSTOMY PLACEMENT LEFT  06/12/2017 . IR NEPHROSTOMY PLACEMENT RIGHT  06/12/2017 . IR URETERAL STENT PLACEMENT EXISTING ACCESS LEFT  06/20/2017 . IR URETERAL STENT PLACEMENT EXISTING ACCESS RIGHT  06/20/2017 . LOWER EXTREMITY ANGIOGRAPHY Right 10/25/2016  Procedure: Lower  Extremity Angiography;  Surgeon: Elam Dutch, MD;  Location: [facility];  Service: Cardiovascular;  Laterality: Right; HPI: 72 year old male admitted with decreased responsiveness, confusion, worsening baseline cough, high fever, difficulty swallowing, decreased po intake. Being treated for candiduria and UTI. CXR negative.  PMH of ETOH abuse, dementia, CVA.  No Data Recorded Assessment / Plan / Recommendation CHL IP CLINICAL IMPRESSIONS 06/23/2017 Clinical Impression Although it is recommended pt continue honey thick liquids, swallow function has mildly improved marked by ability to initiate a swallow with each trial (unlike prior MBS) and initiation was delayed but faster. Larger sips of nectar thick barium were grossly aspirated without immediate or adequate sensation. When cued for smaller sips nectar he complied 50% of the time, therefore recommend he continue honey thick liquids with continued ST at SNF to practice small sips with nectar and upgrading if/when able. Prolonged mastication with Dys 2 (graham cracker in applesauce) and continue Dys 1 (puree), small sips and purposeful throat clear and cough throughout meals.    SLP Visit Diagnosis Dysphagia, oropharyngeal phase (R13.12) Attention and concentration deficit following -- Frontal lobe and executive function deficit following -- Impact on safety and function Moderate aspiration risk;Severe aspiration risk   CHL IP TREATMENT RECOMMENDATION 06/23/2017 Treatment Recommendations Therapy as outlined in treatment plan below   Prognosis 06/23/2017 Prognosis for Safe Diet Advancement (No Data) Barriers to Reach Goals -- Barriers/Prognosis Comment -- CHL IP DIET RECOMMENDATION 06/23/2017 SLP Diet Recommendations Dysphagia 1 (Puree) solids;Honey thick liquids Liquid Administration via Cup Medication Administration Crushed with puree Compensations Minimize environmental distractions;Slow rate;Small sips/bites Postural Changes Seated upright at 90  degrees   CHL IP OTHER RECOMMENDATIONS 06/23/2017 Recommended Consults -- Oral Care Recommendations Oral care BID Other Recommendations --   CHL IP FOLLOW UP RECOMMENDATIONS 06/23/2017 Follow up Recommendations Skilled Nursing facility   [facility] IP FREQUENCY AND DURATION 06/23/2017 Speech Therapy Frequency (ACUTE ONLY) min 2x/week Treatment Duration 2 weeks      CHL IP ORAL PHASE 06/23/2017 Oral Phase Impaired Oral - Pudding Teaspoon -- Oral - Pudding Cup -- Oral - Honey Teaspoon NT Oral - Honey Cup Delayed oral transit Oral - Nectar Teaspoon NT Oral - Nectar Cup Delayed oral transit Oral - Nectar Straw Delayed oral transit Oral - Thin Teaspoon -- Oral - Thin Cup -- Oral - Thin Straw -- Oral - Puree NT Oral - Mech Soft Delayed oral transit  Oral - Regular -- Oral - Multi-Consistency -- Oral - Pill -- Oral Phase - Comment --  CHL IP PHARYNGEAL PHASE 06/23/2017 Pharyngeal Phase Impaired Pharyngeal- Pudding Teaspoon -- Pharyngeal -- Pharyngeal- Pudding Cup -- Pharyngeal -- Pharyngeal- Honey Teaspoon NT Pharyngeal -- Pharyngeal- Honey Cup Delayed swallow initiation-vallecula;Pharyngeal residue - valleculae;Pharyngeal residue - pyriform Pharyngeal -- Pharyngeal- Nectar Teaspoon NT Pharyngeal -- Pharyngeal- Nectar Cup Pharyngeal residue - pyriform;Penetration/Aspiration during swallow;Delayed swallow initiation-pyriform sinuses Pharyngeal Material enters airway, passes BELOW cords without attempt by patient to eject out (silent aspiration) Pharyngeal- Nectar Straw Penetration/Aspiration during swallow Pharyngeal Material enters airway, remains ABOVE vocal cords and not ejected out Pharyngeal- Thin Teaspoon -- Pharyngeal -- Pharyngeal- Thin Cup -- Pharyngeal -- Pharyngeal- Thin Straw -- Pharyngeal -- Pharyngeal- Puree NT Pharyngeal -- Pharyngeal- Mechanical Soft Delayed swallow initiation-vallecula Pharyngeal -- Pharyngeal- Regular -- Pharyngeal -- Pharyngeal- Multi-consistency -- Pharyngeal -- Pharyngeal- Pill -- Pharyngeal --  Pharyngeal Comment --  CHL IP CERVICAL ESOPHAGEAL PHASE 06/23/2017 Cervical Esophageal Phase WFL Pudding Teaspoon -- Pudding Cup -- Honey Teaspoon -- Honey Cup -- Nectar Teaspoon -- Nectar Cup -- Nectar Straw -- Thin Teaspoon -- Thin Cup -- Thin Straw -- Puree -- Mechanical Soft -- Regular -- Multi-consistency -- Pill -- Cervical Esophageal Comment -- No flowsheet data found. Alan [name] 06/23/2017, 1:29 PM Alan [name].Ed CCC-SLP Pager [phone]               ED COURSE  Nursing notes and initial vitals signs, including pulse oximetry, reviewed.  Vitals:   06/24/17 2048 06/24/17 2104 06/24/17 2226 06/25/17 0039  BP:  119/62 121/69 138/68  Pulse:  (!) 101 99 80  Resp:  _0 Temp:  98.7 F (37.1 C)    TempSrc:  Oral    SpO2: 98% 97% 96% 97%  Weight:    75.3 kg (166 lb)  Height:    _1  (1.854 m)   1:15 AM PICC line now functioning after TPA administration.  PROCEDURES    ED DIAGNOSES     ICD-10-CM   1. Occlusion of peripherally inserted central catheter (PICC) line, initial encounter ([place]) T47.076J        Alan Rosser, MD 06/25/17 [phone]

## 2017-06-25 NOTE — ED Notes (Signed)
IV team at bedside with pt.

## 2017-06-25 NOTE — ED Notes (Signed)
[facility] [employer] notified of need for transport of pt back to residence.  

## 2017-06-26 DIAGNOSIS — I10 Essential (primary) hypertension: Secondary | ICD-10-CM | POA: Diagnosis not present

## 2017-06-26 DIAGNOSIS — D649 Anemia, unspecified: Secondary | ICD-10-CM | POA: Diagnosis not present

## 2017-07-02 ENCOUNTER — Emergency Department (HOSPITAL_COMMUNITY): Payer: Medicare (Managed Care)

## 2017-07-02 ENCOUNTER — Encounter (HOSPITAL_COMMUNITY): Payer: Self-pay | Admitting: *Deleted

## 2017-07-02 ENCOUNTER — Telehealth: Payer: Self-pay | Admitting: General Surgery

## 2017-07-02 ENCOUNTER — Inpatient Hospital Stay (HOSPITAL_COMMUNITY)
Admission: EM | Admit: 2017-07-02 | Discharge: 2017-07-07 | DRG: 698 | Disposition: A | Discharge status: Skilled Nursing Facility | Payer: Medicare (Managed Care) | Attending: Internal Medicine | Admitting: Internal Medicine

## 2017-07-02 DIAGNOSIS — R338 Other retention of urine: Secondary | ICD-10-CM | POA: Diagnosis present

## 2017-07-02 DIAGNOSIS — Z1612 Extended spectrum beta lactamase (ESBL) resistance: Secondary | ICD-10-CM | POA: Diagnosis present

## 2017-07-02 DIAGNOSIS — Z794 Long term (current) use of insulin: Secondary | ICD-10-CM

## 2017-07-02 DIAGNOSIS — A419 Sepsis, unspecified organism: Secondary | ICD-10-CM | POA: Diagnosis present

## 2017-07-02 DIAGNOSIS — Z955 Presence of coronary angioplasty implant and graft: Secondary | ICD-10-CM | POA: Diagnosis not present

## 2017-07-02 DIAGNOSIS — K5900 Constipation, unspecified: Secondary | ICD-10-CM | POA: Diagnosis present

## 2017-07-02 DIAGNOSIS — Z8673 Personal history of transient ischemic attack (TIA), and cerebral infarction without residual deficits: Secondary | ICD-10-CM | POA: Diagnosis not present

## 2017-07-02 DIAGNOSIS — E1151 Type 2 diabetes mellitus with diabetic peripheral angiopathy without gangrene: Secondary | ICD-10-CM | POA: Diagnosis present

## 2017-07-02 DIAGNOSIS — F419 Anxiety disorder, unspecified: Secondary | ICD-10-CM | POA: Diagnosis present

## 2017-07-02 DIAGNOSIS — N390 Urinary tract infection, site not specified: Secondary | ICD-10-CM | POA: Diagnosis present

## 2017-07-02 DIAGNOSIS — Z7982 Long term (current) use of aspirin: Secondary | ICD-10-CM

## 2017-07-02 DIAGNOSIS — Y838 Other surgical procedures as the cause of abnormal reaction of the patient, or of later complication, without mention of misadventure at the time of the procedure: Secondary | ICD-10-CM | POA: Diagnosis not present

## 2017-07-02 DIAGNOSIS — E119 Type 2 diabetes mellitus without complications: Secondary | ICD-10-CM

## 2017-07-02 DIAGNOSIS — Z89512 Acquired absence of left leg below knee: Secondary | ICD-10-CM

## 2017-07-02 DIAGNOSIS — E785 Hyperlipidemia, unspecified: Secondary | ICD-10-CM | POA: Diagnosis present

## 2017-07-02 DIAGNOSIS — Z79899 Other long term (current) drug therapy: Secondary | ICD-10-CM | POA: Diagnosis not present

## 2017-07-02 DIAGNOSIS — T83512A Infection and inflammatory reaction due to nephrostomy catheter, initial encounter: Principal | ICD-10-CM | POA: Diagnosis present

## 2017-07-02 DIAGNOSIS — F0390 Unspecified dementia without behavioral disturbance: Secondary | ICD-10-CM | POA: Diagnosis present

## 2017-07-02 DIAGNOSIS — N200 Calculus of kidney: Secondary | ICD-10-CM

## 2017-07-02 DIAGNOSIS — Z936 Other artificial openings of urinary tract status: Secondary | ICD-10-CM

## 2017-07-02 DIAGNOSIS — Z87891 Personal history of nicotine dependence: Secondary | ICD-10-CM

## 2017-07-02 DIAGNOSIS — N401 Enlarged prostate with lower urinary tract symptoms: Secondary | ICD-10-CM | POA: Diagnosis present

## 2017-07-02 DIAGNOSIS — B961 Klebsiella pneumoniae [K. pneumoniae] as the cause of diseases classified elsewhere: Secondary | ICD-10-CM | POA: Diagnosis present

## 2017-07-02 DIAGNOSIS — T83512D Infection and inflammatory reaction due to nephrostomy catheter, subsequent encounter: Secondary | ICD-10-CM | POA: Diagnosis not present

## 2017-07-02 DIAGNOSIS — E876 Hypokalemia: Secondary | ICD-10-CM | POA: Diagnosis present

## 2017-07-02 DIAGNOSIS — T83512S Infection and inflammatory reaction due to nephrostomy catheter, sequela: Secondary | ICD-10-CM | POA: Diagnosis not present

## 2017-07-02 DIAGNOSIS — A4159 Other Gram-negative sepsis: Secondary | ICD-10-CM | POA: Diagnosis present

## 2017-07-02 DIAGNOSIS — G9341 Metabolic encephalopathy: Secondary | ICD-10-CM

## 2017-07-02 DIAGNOSIS — B9689 Other specified bacterial agents as the cause of diseases classified elsewhere: Secondary | ICD-10-CM

## 2017-07-02 DIAGNOSIS — Y738 Miscellaneous gastroenterology and urology devices associated with adverse incidents, not elsewhere classified: Secondary | ICD-10-CM | POA: Diagnosis present

## 2017-07-02 DIAGNOSIS — G4700 Insomnia, unspecified: Secondary | ICD-10-CM | POA: Diagnosis present

## 2017-07-02 DIAGNOSIS — Z66 Do not resuscitate: Secondary | ICD-10-CM | POA: Diagnosis present

## 2017-07-02 DIAGNOSIS — I252 Old myocardial infarction: Secondary | ICD-10-CM

## 2017-07-02 DIAGNOSIS — N132 Hydronephrosis with renal and ureteral calculous obstruction: Secondary | ICD-10-CM | POA: Diagnosis not present

## 2017-07-02 DIAGNOSIS — N3289 Other specified disorders of bladder: Secondary | ICD-10-CM | POA: Diagnosis present

## 2017-07-02 DIAGNOSIS — N201 Calculus of ureter: Secondary | ICD-10-CM | POA: Diagnosis not present

## 2017-07-02 DIAGNOSIS — Z87442 Personal history of urinary calculi: Secondary | ICD-10-CM

## 2017-07-02 LAB — CBC WITH DIFFERENTIAL/PLATELET
Basophils Absolute: 0 10*3/uL (ref 0.0–0.1)
Basophils Relative: 0 %
EOS PCT: 0 %
Eosinophils Absolute: 0 10*3/uL (ref 0.0–0.7)
HEMATOCRIT: 30.9 % — AB (ref 39.0–52.0)
HEMOGLOBIN: 10.1 g/dL — AB (ref 13.0–17.0)
LYMPHS ABS: 1.9 10*3/uL (ref 0.7–4.0)
LYMPHS PCT: 30 %
MCH: 29.4 pg (ref 26.0–34.0)
MCHC: 32.7 g/dL (ref 30.0–36.0)
MCV: 89.8 fL (ref 78.0–100.0)
MONO ABS: 0.5 10*3/uL (ref 0.1–1.0)
MONOS PCT: 8 %
Neutro Abs: 3.8 10*3/uL (ref 1.7–7.7)
Neutrophils Relative %: 62 %
Platelets: 262 10*3/uL (ref 150–400)
RBC: 3.44 MIL/uL — ABNORMAL LOW (ref 4.22–5.81)
RDW: 12.7 % (ref 11.5–15.5)
WBC: 6.3 10*3/uL (ref 4.0–10.5)

## 2017-07-02 LAB — URINALYSIS, ROUTINE W REFLEX MICROSCOPIC
Bilirubin Urine: NEGATIVE
GLUCOSE, UA: 150 mg/dL — AB
Ketones, ur: NEGATIVE mg/dL
Nitrite: NEGATIVE
Protein, ur: 30 mg/dL — AB
SPECIFIC GRAVITY, URINE: 1.008 (ref 1.005–1.030)
Squamous Epithelial / LPF: NONE SEEN
pH: 6 (ref 5.0–8.0)

## 2017-07-02 LAB — COMPREHENSIVE METABOLIC PANEL
ALBUMIN: 3 g/dL — AB (ref 3.5–5.0)
ALK PHOS: 89 U/L (ref 38–126)
ALT: 17 U/L (ref 17–63)
ANION GAP: 12 (ref 5–15)
AST: 24 U/L (ref 15–41)
BILIRUBIN TOTAL: 0.4 mg/dL (ref 0.3–1.2)
BUN: 15 mg/dL (ref 6–20)
CALCIUM: 8.5 mg/dL — AB (ref 8.9–10.3)
CO2: 31 mmol/L (ref 22–32)
Chloride: 90 mmol/L — ABNORMAL LOW (ref 101–111)
Creatinine, Ser: 0.93 mg/dL (ref 0.61–1.24)
GFR calc Af Amer: >60 mL/min (ref >60)
GFR calc non Af Amer: >60 mL/min (ref >60)
GLUCOSE: 333 mg/dL — AB (ref 65–99)
POTASSIUM: 3.1 mmol/L — AB (ref 3.5–5.1)
SODIUM: 133 mmol/L — AB (ref 135–145)
TOTAL PROTEIN: 7.3 g/dL (ref 6.5–8.1)

## 2017-07-02 LAB — I-STAT CG4 LACTIC ACID, ED
LACTIC ACID, VENOUS: 1.83 mmol/L (ref 0.5–1.9)
Lactic Acid, Venous: 3.59 mmol/L (ref 0.5–1.9)

## 2017-07-02 MED ORDER — LORAZEPAM 0.5 MG PO TABS
0.5000 mg | ORAL_TABLET | Freq: Two times a day (BID) | ORAL | Status: DC
  Administered 2017-07-02 – 2017-07-07 (×10): 0.5 mg via ORAL
  Filled 2017-07-02 (×10): qty 1

## 2017-07-02 MED ORDER — ANIDULAFUNGIN IV (FOR PTA / DISCHARGE USE ONLY): 100.0000 mg | INTRAVENOUS | Status: DC

## 2017-07-02 MED ORDER — POTASSIUM CHLORIDE CRYS ER 20 MEQ PO TBCR
40.0000 meq | EXTENDED_RELEASE_TABLET | Freq: Once | ORAL | Status: AC
  Administered 2017-07-02: 40 meq via ORAL
  Filled 2017-07-02: qty 2

## 2017-07-02 MED ORDER — INSULIN GLARGINE 100 UNIT/ML ~~LOC~~ SOLN
44.0000 [IU] | Freq: Every day | SUBCUTANEOUS | Status: DC
  Administered 2017-07-03: 44 [IU] via SUBCUTANEOUS
  Filled 2017-07-02: qty 0.44

## 2017-07-02 MED ORDER — MELATONIN 10 MG PO TABS: 10.0000 mg | ORAL_TABLET | Freq: Every day | ORAL | Status: DC

## 2017-07-02 MED ORDER — ENOXAPARIN SODIUM 40 MG/0.4ML ~~LOC~~ SOLN
40.0000 mg | Freq: Every day | SUBCUTANEOUS | Status: DC
  Administered 2017-07-02 – 2017-07-06 (×5): 40 mg via SUBCUTANEOUS
  Filled 2017-07-02 (×5): qty 0.4

## 2017-07-02 MED ORDER — LOPERAMIDE HCL 2 MG PO CAPS: 4.0000 mg | ORAL_CAPSULE | Freq: Four times a day (QID) | ORAL | Status: DC | PRN

## 2017-07-02 MED ORDER — SODIUM CHLORIDE 0.9 % IV SOLN
200.0000 mg | INTRAVENOUS | Status: DC
  Filled 2017-07-02: qty 200

## 2017-07-02 MED ORDER — SODIUM CHLORIDE 0.9 % IV SOLN
200.0000 mg | Freq: Once | INTRAVENOUS | Status: AC
  Administered 2017-07-02: 200 mg via INTRAVENOUS
  Filled 2017-07-02: qty 200

## 2017-07-02 MED ORDER — ONDANSETRON HCL 4 MG/2ML IJ SOLN: 4.0000 mg | Freq: Four times a day (QID) | INTRAMUSCULAR | Status: DC | PRN

## 2017-07-02 MED ORDER — TAMSULOSIN HCL 0.4 MG PO CAPS
0.4000 mg | ORAL_CAPSULE | Freq: Every evening | ORAL | Status: DC
  Administered 2017-07-03 – 2017-07-06 (×4): 0.4 mg via ORAL
  Filled 2017-07-02 (×5): qty 1

## 2017-07-02 MED ORDER — SODIUM CHLORIDE 0.9 % IV SOLN
1500.0000 mg | INTRAVENOUS | Status: DC
  Administered 2017-07-03: 1500 mg via INTRAVENOUS
  Filled 2017-07-02: qty 1500

## 2017-07-02 MED ORDER — SODIUM CHLORIDE 0.9 % IV BOLUS (SEPSIS)
1000.0000 mL | Freq: Once | INTRAVENOUS | Status: AC
  Administered 2017-07-02: 1000 mL via INTRAVENOUS

## 2017-07-02 MED ORDER — SODIUM CHLORIDE 0.9 % IV SOLN: 100.0000 mg | INTRAVENOUS | Status: DC

## 2017-07-02 MED ORDER — SERTRALINE HCL 50 MG PO TABS
50.0000 mg | ORAL_TABLET | Freq: Two times a day (BID) | ORAL | Status: DC
  Administered 2017-07-02 – 2017-07-07 (×10): 50 mg via ORAL
  Filled 2017-07-02 (×10): qty 1

## 2017-07-02 MED ORDER — ACETAMINOPHEN 325 MG PO TABS
650.0000 mg | ORAL_TABLET | Freq: Three times a day (TID) | ORAL | Status: DC
  Administered 2017-07-02 – 2017-07-07 (×14): 650 mg via ORAL
  Filled 2017-07-02 (×14): qty 2

## 2017-07-02 MED ORDER — ZINC OXIDE 11.3 % EX CREA
1.0000 "application " | TOPICAL_CREAM | Freq: Every day | CUTANEOUS | Status: DC
  Administered 2017-07-03 – 2017-07-07 (×21): 1 via TOPICAL
  Filled 2017-07-02 (×2): qty 56

## 2017-07-02 MED ORDER — VANCOMYCIN IV (FOR PTA / DISCHARGE USE ONLY): 1250.0000 mg | INTRAVENOUS | Status: DC

## 2017-07-02 MED ORDER — ACETAMINOPHEN 325 MG PO TABS
650.0000 mg | ORAL_TABLET | Freq: Once | ORAL | Status: AC
  Administered 2017-07-02: 650 mg via ORAL
  Filled 2017-07-02: qty 2

## 2017-07-02 MED ORDER — TRAZODONE HCL 50 MG PO TABS
50.0000 mg | ORAL_TABLET | Freq: Every day | ORAL | Status: DC
  Administered 2017-07-02 – 2017-07-06 (×5): 50 mg via ORAL
  Filled 2017-07-02 (×5): qty 1

## 2017-07-02 MED ORDER — ASPIRIN EC 81 MG PO TBEC
81.0000 mg | DELAYED_RELEASE_TABLET | Freq: Every day | ORAL | Status: DC
  Administered 2017-07-03 – 2017-07-07 (×5): 81 mg via ORAL
  Filled 2017-07-02 (×5): qty 1

## 2017-07-02 MED ORDER — ATORVASTATIN CALCIUM 10 MG PO TABS
10.0000 mg | ORAL_TABLET | Freq: Every evening | ORAL | Status: DC
  Administered 2017-07-03 – 2017-07-06 (×4): 10 mg via ORAL
  Filled 2017-07-02 (×4): qty 1

## 2017-07-02 MED ORDER — HYDROCERIN EX CREA
1.0000 "application " | TOPICAL_CREAM | Freq: Every day | CUTANEOUS | Status: DC | PRN
  Filled 2017-07-02: qty 113

## 2017-07-02 MED ORDER — VANCOMYCIN HCL 10 G IV SOLR
1500.0000 mg | Freq: Once | INTRAVENOUS | Status: AC
  Administered 2017-07-02: 1500 mg via INTRAVENOUS
  Filled 2017-07-02: qty 1500

## 2017-07-02 MED ORDER — SODIUM CHLORIDE 0.9 % IV BOLUS (SEPSIS)
500.0000 mL | Freq: Once | INTRAVENOUS | Status: AC
  Administered 2017-07-02: 500 mL via INTRAVENOUS

## 2017-07-02 MED ORDER — KETOROLAC TROMETHAMINE 15 MG/ML IJ SOLN
15.0000 mg | Freq: Once | INTRAMUSCULAR | Status: AC
  Administered 2017-07-02: 15 mg via INTRAVENOUS
  Filled 2017-07-02: qty 1

## 2017-07-02 MED ORDER — LIDOCAINE 5 % EX PTCH
1.0000 | MEDICATED_PATCH | Freq: Every day | CUTANEOUS | Status: DC
  Administered 2017-07-02 – 2017-07-06 (×5): 1 via TRANSDERMAL
  Filled 2017-07-02 (×5): qty 1

## 2017-07-02 MED ORDER — FLUTICASONE PROPIONATE 50 MCG/ACT NA SUSP
1.0000 | Freq: Every day | NASAL | Status: DC
  Administered 2017-07-04 – 2017-07-06 (×3): 1 via NASAL
  Filled 2017-07-02: qty 16

## 2017-07-02 MED ORDER — SODIUM CHLORIDE 0.9 % IV SOLN
100.0000 mg | INTRAVENOUS | Status: DC
  Administered 2017-07-03: 100 mg via INTRAVENOUS
  Filled 2017-07-02: qty 100

## 2017-07-02 MED ORDER — ONDANSETRON HCL 4 MG PO TABS: 4.0000 mg | ORAL_TABLET | Freq: Four times a day (QID) | ORAL | Status: DC | PRN

## 2017-07-02 NOTE — Progress Notes (Signed)
Dr. Mayford KnifeWilliams with [facility] called about this patient.  She left a message with the IR techs that this patient is having fevers, tachycardia, and confusion.  He had PCNs in place and these were exchanged and capped after internalization with stents was done on 06-20-17.  The patient appears to have been DC on vanc and Eraxis for fungemia.  I called the provider back and got her voicemail.  I recommended the patient likely needed to be brought to the ED given the symptoms of fever, tachy, and confusion.  At that time he could be evaluated and if his urinary system was found to be the source then we could address these drains, etc at that time.  I left our office number as well as the control room number for her to call back if needed.  Letha CapeKelly E Griff Badley 3:50 PM 07/02/2017

## 2017-07-02 NOTE — ED Notes (Signed)
2 failed attempts to IN & Out. RN witnessed

## 2017-07-02 NOTE — ED Notes (Signed)
Dr. Charissa BashJulie Williams phones to tell us that this pt. Had bilat. Nephrostomy tubes inserted by Dr. Vernie Ammonsttelin ~ 2-3 weeks ago. Pt. Had completed antibiotic therapy and was doing well until yesterday/today when he began to experience fever/lethargy. He is slated to see Dr. Vernie Ammonsttelin to have tubes removed 07-07-2017. Dr. Mayford KnifeWilliams states she had wanted pt. To [facility] via their Internal Midicine Teaching service, of whom he is a patient. She wishes our E.D.P. To call her with any questions [phone]. He had dx of bilat. Obstructing stones with bacteremia and fungemia.

## 2017-07-02 NOTE — H&P (Signed)
[facility] History and Physical  Alan [name]. TIR:[id number] DOB: 1945/07/14 DOA: 07/02/2017  Referring physician:  PCP: Angelica Pou, MD   Chief Complaint: "I felt hot."  HPI: Alan [name]. is a 72 y.o. male with past medical history of dementia, diabetes and stroke. Pt had feelign of old this at [facility]. Taken to office for evaluation and found to have fever. Pt just off of abx this past Sunday.  Had a PICC line for prolonged IV antibiotics.  ED Course: Found to be septic.  Infectious disease consulted by EDP.  Hospitalist consulted for admission.   Review of Systems:  As per HPI otherwise 10 point review of systems negative.    Past Medical History:  Diagnosis Date  . Anemia   . Arthritis   . Dementia   . Diabetes mellitus without complication ([place])   . ETOH abuse   . Myocardial infarction ([place])   . Peripheral arterial disease ([place])   . Stroke ([place])   . Wears dentures   . Wears glasses    Past Surgical History:  Procedure Laterality Date  . ABDOMINAL AORTOGRAM N/A 10/25/2016   Procedure: Abdominal Aortogram;  Surgeon: Elam Dutch, MD;  Location: [facility];  Service: Cardiovascular;  Laterality: N/A;  . BRAIN SURGERY    . CORONARY STENT PLACEMENT     " about 25 years ago (10/29/16)  . ENDARTERECTOMY FEMORAL Right 10/30/2016   Procedure: ENDARTERECTOMY FEMORAL-RIGHT WITH PATCH GRAFT;  Surgeon: Elam Dutch, MD;  Location: [facility];  Service: Vascular;  Laterality: Right;  . IR NEPHROSTOMY EXCHANGE LEFT  06/20/2017  . IR NEPHROSTOMY EXCHANGE RIGHT  06/20/2017  . IR NEPHROSTOMY PLACEMENT LEFT  06/12/2017  . IR NEPHROSTOMY PLACEMENT RIGHT  06/12/2017  . IR URETERAL STENT PLACEMENT EXISTING ACCESS LEFT  06/20/2017  . IR URETERAL STENT PLACEMENT EXISTING ACCESS RIGHT  06/20/2017  . LOWER EXTREMITY ANGIOGRAPHY Right 10/25/2016   Procedure: Lower Extremity Angiography;  Surgeon: Elam Dutch, MD;  Location: [facility];  Service:  Cardiovascular;  Laterality: Right;   Social History:  reports that he has quit smoking. His smoking use included cigarettes. he has never used smokeless tobacco. He reports that he does not drink alcohol or use drugs.  Allergies  Allergen Reactions  . Penicillins Rash    Has patient had a PCN reaction causing immediate rash, facial/tongue/throat swelling, SOB or lightheadedness with hypotension: Yes Has patient had a PCN reaction causing severe rash involving mucus membranes or skin necrosis: No Has patient had a PCN reaction that required hospitalization No Has patient had a PCN reaction occurring within the last 10 years: No If all of the above answers are "NO", then may proceed with Cephalosporin use.     Family History  Problem Relation Age of Onset  . Diabetes Mother   . Heart disease Father   . Heart disease Brother      Prior to Admission medications   Medication Sig Start Date End Date Taking? Authorizing Provider  acetaminophen (TYLENOL) 650 MG CR tablet Take 650 mg by mouth 3 (three) times daily.    Yes [provider]  alendronate (FOSAMAX) 70 MG tablet Take 70 mg by mouth once a week. Take with a full glass of water on an empty stomach.   Yes [provider]  aspirin EC 81 MG tablet Take 81 mg by mouth daily.   Yes [provider]  atorvastatin (LIPITOR) 10 MG tablet Take 10  mg by mouth every evening.   Yes [provider]  fluticasone (FLONASE) 50 MCG/ACT nasal spray Place 1 spray into the nose daily as needed for rhinitis.  01/01/16 07/02/17 Yes [provider]  insulin glargine (LANTUS) 100 UNIT/ML injection Inject 44 Units into the skin at bedtime.  06/11/16  Yes [provider]  lidocaine (LIDODERM) 5 % Place 1 patch onto the skin daily. Remove & Discard patch within 12 hours or as directed by MD   Yes [provider]  loperamide (IMODIUM) 2 MG capsule Take 2 capsules (4 mg total) by mouth every 6 (six) hours  as needed for diarrhea or loose stools. 06/23/17  Yes Santos-Sanchez, Merlene Morse, MD  LORazepam (ATIVAN) 1 MG tablet Take 0.5 tablets (0.5 mg total) by mouth 2 (two) times daily. 06/23/17  Yes Santos-Sanchez, Merlene Morse, MD  Melatonin 10 MG TABS Take 10 mg by mouth at bedtime.   Yes [provider]  metFORMIN (GLUCOPHAGE-XR) 500 MG 24 hr tablet Take 1,000 mg by mouth daily with breakfast.   Yes [provider]  sertraline (ZOLOFT) 50 MG tablet Take 50 mg by mouth 2 (two) times daily.    Yes [provider]  Skin Protectants, Misc. (EUCERIN) cream Apply 1 application topically daily as needed for dry skin.   Yes [provider]  tamsulosin (FLOMAX) 0.4 MG CAPS capsule Take 0.4 mg by mouth every evening.   Yes [provider]  traZODone (DESYREL) 50 MG tablet TAKE ONE TABLET (50 MG TOTAL) BY MOUTH AT BEDTIME. 09/11/16  Yes [provider]  zinc oxide (BALMEX) 11.3 % CREA cream Apply 1 application topically 6 (six) times daily.   Yes [provider]  anidulafungin (ERAXIS) IVPB Inject 100 mg into the vein daily. Indication: candidemia Last Day of Therapy:  06/29/17 Labs - Once weekly:  CBC/D and BMP, Labs - Every other week:  ESR and CRP Patient not taking: Reported on 07/02/2017 06/23/17   Welford Roche, MD  vancomycin IVPB Inject 1,250 mg into the vein daily for 10 days. Indication:  bacteremia Last Day of Therapy:  06/29/17 Labs - Sunday/Monday:  CBC/D, BMP, and vancomycin trough. Labs - Thursday:  BMP and vancomycin trough Labs - Every other week:  ESR and CRP Patient not taking: Reported on 07/02/2017 06/23/17 07/03/17  Welford Roche, MD   Physical Exam: Vitals:   07/02/17 2015 07/02/17 2030 07/02/17 2045 07/02/17 2100  BP:  125/83  126/63  Pulse: 95 94 93 95  Resp: (!) '22 17 20 19  '$ Temp:      TempSrc:      SpO2: 99% 99% 99% 98%    Wt Readings from Last 3 Encounters:  06/25/17 75.3 kg (166 lb)  06/22/17 75.5 kg (166 lb  7.2 oz)  11/28/16 79.4 kg (175 lb)    General:  Appears calm and comfortable: A&Ox3 Eyes:  PERRL, EOMI, normal lids, iris ENT:  grossly normal hearing, lips & tongue Neck:  no LAD, masses or thyromegaly Cardiovascular:  RRR, no m/r/g. No LE edema.  Respiratory:  CTA bilaterally, no w/r/r. Normal respiratory effort. Abdomen:  soft, ntnd Skin:  no rash or induration seen on limited exam Musculoskeletal:  grossly normal tone BUE/BLE Psychiatric:  grossly normal mood and affect, speech fluent and appropriate Neurologic:  CN 2-12 grossly intact, moves all extremities in coordinated fashion. Hardware: Bilateral nephrostomy tubes          Labs on Admission:  Basic Metabolic Panel: Recent Labs  Lab 07/02/17  1732  NA 133*  K 3.1*  CL 90*  CO2 31  GLUCOSE 333*  BUN 15  CREATININE 0.93  CALCIUM 8.5*   Liver Function Tests: Recent Labs  Lab 07/02/17 1732  AST 24  ALT 17  ALKPHOS 89  BILITOT 0.4  PROT 7.3  ALBUMIN 3.0*   No results for input(s): LIPASE, AMYLASE in the last 168 hours. No results for input(s): AMMONIA in the last 168 hours. CBC: Recent Labs  Lab 07/02/17 1732  WBC 6.3  NEUTROABS 3.8  HGB 10.1*  HCT 30.9*  MCV 89.8  PLT 262   Cardiac Enzymes: No results for input(s): CKTOTAL, CKMB, CKMBINDEX, TROPONINI in the last 168 hours.  BNP (last 3 results) No results for input(s): BNP in the last 8760 hours.  ProBNP (last 3 results) No results for input(s): PROBNP in the last 8760 hours.   Serum creatinine: 0.93 mg/dL 07/02/17 1732 Estimated creatinine clearance: 79.8 mL/min  CBG: No results for input(s): GLUCAP in the last 168 hours.  Radiological Exams on Admission: Dg Chest Portable 1 View  Result Date: 07/02/2017 CLINICAL DATA:  Weakness and shortness of breath. EXAM: PORTABLE CHEST 1 VIEW COMPARISON:  06/12/2017 chest CT FINDINGS: Interstitial coarsening attributed to interstitial lung disease with fibrosis seen on prior CT. No acute superimposed  opacity. No effusion or pneumothorax. Normal heart size. IMPRESSION: 1. Mild pulmonary fibrosis. 2. No acute superimposed finding. Electronically Signed   By: Monte Fantasia M.D.   On: 07/02/2017 18:10    EKG: no new  Assessment/Plan Active Problems:   Sepsis ([place])   Sepsis 2/2 urinary source Patient hemodynamically stable Given vancomycin emergency room, will continue Given eraxis in the emergency room, will continue Urine culture pending Blood cultures 2 pending Patient given 2568m of fluid in the emergency room Lactic acid normal Infectious disease consulted by EDP  DM Cont glargine SSI AC Hold metformin  Anxiety Cont zoloft BID ativan  Skin breakdown Skin care meds  Hyperlipidemia Continue statin   BPH Cont flomax  Insomnia Cont trazadone  Code Status: FC DVT Prophylaxis: lovenox Family Communication: wife at bedside Disposition Plan: Pending Improvement  Status: tele inpt  PElwin Mocha MD Family Medicine [facility] [email or web address] Password [email or web address]

## 2017-07-02 NOTE — ED Triage Notes (Signed)
Trans[facility]suspected infection. Paperwork states "Patient was discharged from SNF yesterday. Has been receiving antibiotic treatment through PICC line (which was also removed yesterday) for possible nephrostomy tube infection." Call was placed to [facility] and pt was told to come here for further evaluation. Fever reported 101.4.

## 2017-07-02 NOTE — ED Provider Notes (Signed)
[facility] DEPT Provider Note   CSN: [id number] Arrival date & time: 07/02/17  1631     History   Chief Complaint Chief Complaint  Patient presents with  . Fever    HPI Alan Mascio. is a 72 y.o. male.  The history is provided by the patient and medical records. No language interpreter was used.  Fever     Alan Amezcua. is a 72 y.o. male  with a PMH of DM, dementia, bilateral nephrolithiasis, recent admission for sepsis who presents to the Emergency Department facility for concerns for sepsis.  Patient was experiencing fever T-max 101.4, tachycardia with heart rate of 120 and appeared more confused than he usually is.  The physician at [facility] spoke with surgery who recommended patient come into the emergency department for further evaluation.  Patient was admitted from 12/19-12/31 for bacteremia and fungemia.  He is on IV antibiotics and antifungals, discharged with PICC line on IV cefazolin any anti-fungal.  PICC line was removed yesterday after successful completion.  Patient denies any pain or complaints to me currently.  He is able to tell me his name and that it is January, however history is quite limited due to hx of dementia, new confusion and little insight into his condition.   Level V caveat applies 2/2 mental status change / dementia.  Past Medical History:  Diagnosis Date  . Anemia   . Arthritis   . Dementia   . Diabetes mellitus without complication ([place])   . ETOH abuse   . Myocardial infarction ([place])   . Peripheral arterial disease ([place])   . Stroke ([place])   . Wears dentures   . Wears glasses     Patient Active Problem List   Diagnosis Date Noted  . Candida tropicalis infection 06/20/2017  . Bilateral nephrolithiasis 06/20/2017  . History of nephrostomy ([place])   . Candidemia ([place]) 06/15/2017  . Obstruction of kidney   . Sepsis due to coagulase-negative staphylococcal infection ([place]) 06/11/2017  . 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 ABDOMINAL AORTOGRAM N/A 10/25/2016   Procedure: Abdominal Aortogram;  Surgeon: Elam Dutch, MD;  Location: [facility];  Service: Cardiovascular;  Laterality: N/A;  . BRAIN SURGERY    . CORONARY STENT PLACEMENT     " about 25 years ago (10/29/16)  . ENDARTERECTOMY FEMORAL Right 10/30/2016   Procedure: ENDARTERECTOMY FEMORAL-RIGHT WITH PATCH GRAFT;  Surgeon: Elam Dutch, MD;  Location: [facility];  Service: Vascular;  Laterality: Right;  . IR NEPHROSTOMY EXCHANGE LEFT  06/20/2017  . IR NEPHROSTOMY EXCHANGE RIGHT  06/20/2017  . IR NEPHROSTOMY PLACEMENT LEFT  06/12/2017  . IR NEPHROSTOMY PLACEMENT RIGHT  06/12/2017  . IR URETERAL STENT PLACEMENT EXISTING ACCESS LEFT  06/20/2017  . IR URETERAL STENT PLACEMENT EXISTING ACCESS RIGHT  06/20/2017  . LOWER EXTREMITY ANGIOGRAPHY Right 10/25/2016   Procedure: Lower Extremity Angiography;  Surgeon: Elam Dutch, MD;  Location: [facility];  Service: Cardiovascular;  Laterality: Right;       Home Medications    Prior to Admission medications   Medication Sig Start Date End Date Taking? Authorizing Provider  acetaminophen (TYLENOL) 650 MG CR tablet Take 650 mg by mouth 3 (three) times daily.    Yes [provider]  alendronate (FOSAMAX) 70 MG tablet Take 70 mg by mouth once a week. Take with a full glass of water on an  empty stomach.   Yes [provider]  aspirin EC 81 MG tablet Take 81 mg by mouth daily.   Yes [provider]  atorvastatin (LIPITOR) 10 MG tablet Take 10 mg by mouth every evening.   Yes [provider]  fluticasone (FLONASE) 50 MCG/ACT nasal spray Place 1 spray into the nose daily as needed for rhinitis.  01/01/16 07/02/17 Yes [provider]  insulin glargine (LANTUS) 100 UNIT/ML injection Inject 44 Units into the skin at bedtime.  06/11/16  Yes [provider]  lidocaine  (LIDODERM) 5 % Place 1 patch onto the skin daily. Remove & Discard patch within 12 hours or as directed by MD   Yes [provider]  loperamide (IMODIUM) 2 MG capsule Take 2 capsules (4 mg total) by mouth every 6 (six) hours as needed for diarrhea or loose stools. 06/23/17  Yes Santos-Sanchez, Merlene Morse, MD  LORazepam (ATIVAN) 1 MG tablet Take 0.5 tablets (0.5 mg total) by mouth 2 (two) times daily. 06/23/17  Yes Santos-Sanchez, Merlene Morse, MD  Melatonin 10 MG TABS Take 10 mg by mouth at bedtime.   Yes [provider]  metFORMIN (GLUCOPHAGE-XR) 500 MG 24 hr tablet Take 1,000 mg by mouth daily with breakfast.   Yes [provider]  sertraline (ZOLOFT) 50 MG tablet Take 50 mg by mouth 2 (two) times daily.    Yes [provider]  Skin Protectants, Misc. (EUCERIN) cream Apply 1 application topically daily as needed for dry skin.   Yes [provider]  tamsulosin (FLOMAX) 0.4 MG CAPS capsule Take 0.4 mg by mouth every evening.   Yes [provider]  traZODone (DESYREL) 50 MG tablet TAKE ONE TABLET (50 MG TOTAL) BY MOUTH AT BEDTIME. 09/11/16  Yes [provider]  zinc oxide (BALMEX) 11.3 % CREA cream Apply 1 application topically 6 (six) times daily.   Yes [provider]  anidulafungin (ERAXIS) IVPB Inject 100 mg into the vein daily. Indication: candidemia Last Day of Therapy:  06/29/17 Labs - Once weekly:  CBC/D and BMP, Labs - Every other week:  ESR and CRP Patient not taking: Reported on 07/02/2017 06/23/17   Welford Roche, MD  vancomycin IVPB Inject 1,250 mg into the vein daily for 10 days. Indication:  bacteremia Last Day of Therapy:  06/29/17 Labs - Sunday/Monday:  CBC/D, BMP, and vancomycin trough. Labs - Thursday:  BMP and vancomycin trough Labs - Every other week:  ESR and CRP Patient not taking: Reported on 07/02/2017 06/23/17 07/03/17  Welford Roche, MD    Family History Family History  Problem Relation Age of  Onset  . Diabetes Mother   . Heart disease Father   . Heart disease Brother     Social History Social History   Tobacco Use  . Smoking status: Former Smoker    Types: Cigarettes  . Smokeless tobacco: Never Used  . Tobacco comment: Quit smoking cigarettes 10/ 2017  Substance Use Topics  . Alcohol use: No    Comment: former  . Drug use: No     Allergies   Penicillins   Review of Systems Review of Systems  Unable to perform ROS: Mental status change  Constitutional: Positive for fever.     Physical Exam Updated Vital Signs BP 126/63   Pulse 95   Temp (!) 101.6 F (38.7 C) (Rectal)   Resp 19   SpO2 98%   Physical Exam  Constitutional: He is oriented to person, place, and time. He appears well-developed and  well-nourished. No distress.  HENT:  Head: Normocephalic and atraumatic.  Cardiovascular: Normal rate, regular rhythm and normal heart sounds.  No murmur heard. Pulmonary/Chest: Effort normal and breath sounds normal. No respiratory distress.  Abdominal: Soft. He exhibits no distension. There is no tenderness.  Neurological: He is alert and oriented to person, place, and time.  Skin: Skin is warm and dry.  Nursing note and vitals reviewed.    ED Treatments / Results  Labs (all labs ordered are listed, but only abnormal results are displayed) Labs Reviewed  URINALYSIS, ROUTINE W REFLEX MICROSCOPIC - Abnormal; Notable for the following components:      Result Value   APPearance CLOUDY (*)    Glucose, UA 150 (*)    Hgb urine dipstick MODERATE (*)    Protein, ur 30 (*)    Leukocytes, UA LARGE (*)    Bacteria, UA MANY (*)    All other components within normal limits  CBC WITH DIFFERENTIAL/PLATELET - Abnormal; Notable for the following components:   RBC 3.44 (*)    Hemoglobin 10.1 (*)    HCT 30.9 (*)    All other components within normal limits  COMPREHENSIVE METABOLIC PANEL - Abnormal; Notable for the following components:   Sodium 133 (*)     Potassium 3.1 (*)    Chloride 90 (*)    Glucose, Bld 333 (*)    Calcium 8.5 (*)    Albumin 3.0 (*)    All other components within normal limits  I-STAT CG4 LACTIC ACID, ED - Abnormal; Notable for the following components:   Lactic Acid, Venous 3.59 (*)    All other components within normal limits  CULTURE, BLOOD (ROUTINE X 2)  CULTURE, BLOOD (ROUTINE X 2)  URINE CULTURE  I-STAT CG4 LACTIC ACID, ED  I-STAT CG4 LACTIC ACID, ED  I-STAT CG4 LACTIC ACID, ED    EKG  EKG Interpretation None       Radiology Dg Chest Portable 1 View  Result Date: 07/02/2017 CLINICAL DATA:  Weakness and shortness of breath. EXAM: PORTABLE CHEST 1 VIEW COMPARISON:  06/12/2017 chest CT FINDINGS: Interstitial coarsening attributed to interstitial lung disease with fibrosis seen on prior CT. No acute superimposed opacity. No effusion or pneumothorax. Normal heart size. IMPRESSION: 1. Mild pulmonary fibrosis. 2. No acute superimposed finding. Electronically Signed   By: Monte Fantasia M.D.   On: 07/02/2017 18:10    Procedures Procedures (including critical care time)  CRITICAL CARE Performed by: Ozella Almond Ward   Total critical care time: 40 minutes  Critical care time was exclusive of separately billable procedures and treating other patients.  Critical care was necessary to treat or prevent imminent or life-threatening deterioration.  Critical care was time spent personally by me on the following activities: development of treatment plan with patient and/or surrogate as well as nursing, discussions with consultants, evaluation of patient's response to treatment, examination of patient, obtaining history from patient or surrogate, ordering and performing treatments and interventions, ordering and review of laboratory studies, ordering and review of radiographic studies, pulse oximetry and re-evaluation of patient's condition.   Medications Ordered in ED Medications  anidulafungin (ERAXIS) 100 mg  in sodium chloride 0.9 % 100 mL IVPB (not administered)  vancomycin (VANCOCIN) 1,500 mg in sodium chloride 0.9 % 500 mL IVPB (1,500 mg Intravenous New Bag/Given 07/02/17 2059)  vancomycin (VANCOCIN) 1,500 mg in sodium chloride 0.9 % 500 mL IVPB (not administered)  sodium chloride 0.9 % bolus 1,000 mL (1,000 mLs Intravenous New Bag/Given  07/02/17 1749)  acetaminophen (TYLENOL) tablet 650 mg (650 mg Oral Given 07/02/17 1749)  sodium chloride 0.9 % bolus 1,000 mL (0 mLs Intravenous Stopped 07/02/17 1954)    And  sodium chloride 0.9 % bolus 500 mL (0 mLs Intravenous Stopped 07/02/17 2058)  anidulafungin (ERAXIS) 200 mg in sodium chloride 0.9 % 200 mL IVPB (200 mg Intravenous New Bag/Given 07/02/17 1845)  potassium chloride SA (K-DUR,KLOR-CON) CR tablet 40 mEq (40 mEq Oral Given 07/02/17 1952)     Initial Impression / Assessment and Plan / ED Course  I have reviewed the triage vital signs and the nursing notes.  Pertinent labs & imaging results that were available during my care of the patient were reviewed by me and considered in my medical decision making (see chart for details).    Alan Geimer. is a 72 y.o. male who presents to ED for fever, tachycardia, confusion. Recently admitted for sepsis for several weeks in December. Chart extensively reviewed. Fever 101.6 HR in 120's upon my initial evaluation. Code sepsis initiated. Blood cx's obtained. Weight-based fluids started.  Consulted ID who recommends starting Vancomycin and Eraxis. ID will follow.  Consulted urology who recommends medical admission and urology will see in consultation, likely tomorrow.   Lactic 3.59 which did clear with hydration. White count 6.3. Hypokalemia which was replenished in ED. Stable anemia. CXR with no signs of acute infection. UA with large leuks, TNTC wbc's, many bacteria.   Hospitalist consulted who will admit.   Patient seen by and discussed with Dr. Alvino Chapel who agrees with treatment plan.   Final Clinical  Impressions(s) / ED Diagnoses   Final diagnoses:  Sepsis, due to unspecified organism [facility])    ED Discharge Orders    None       Ward, Ozella Almond, PA-C 07/02/17 2257    Davonna Belling, MD 07/03/17 [phone]

## 2017-07-02 NOTE — ED Notes (Signed)
ED TO INPATIENT HANDOFF REPORT  Name/Age/Gender Alan [name]. 72 y.o. male  Code Status    Code Status Orders  (From admission, onward)        Start     Ordered   07/02/17 2309  Do not attempt resuscitation (DNR)  Continuous    Question Answer Comment  In the event of cardiac or respiratory ARREST Do not call a "code blue"   In the event of cardiac or respiratory ARREST Do not perform Intubation, CPR, defibrillation or ACLS   In the event of cardiac or respiratory ARREST Use medication by any route, position, wound care, and other measures to relive pain and suffering. May use oxygen, suction and manual treatment of airway obstruction as needed for comfort.      07/02/17 2309    Code Status History    Date Active Date Inactive Code Status Order ID Comments User Context   06/11/2017 16:43 06/23/2017 19:05 DNR [id number]  Valinda Party, DO ED   10/30/2016 19:28 10/31/2016 15:44 Full Code [id number]  Orbie Hurst Inpatient   10/25/2016 11:38 10/25/2016 19:20 Full Code [id number]  Elam Dutch, MD Inpatient      Home/SNF/Other Skilled nursing facility  Chief Complaint Fever, possible infection, tx from [facility]  Level of Care/Admitting Diagnosis ED Disposition    ED Disposition Condition [place]: [facility] [[id number]]  Level of Care: Telemetry [5]  Admit to tele based on following criteria: Monitor for Ischemic changes  Diagnosis: Sepsis [facility]) [[id number]]  Admitting Physician: Elwin Mocha [[id number]]  Attending Physician: Elwin Mocha [[id number]]  Estimated length of stay: 3 - 4 days  Certification:: I certify this patient will need inpatient services for at least 2 midnights  PT Class (Do Not Modify): Inpatient [101]  PT Acc Code (Do Not Modify): Private [1]       Medical History Past Medical History:  Diagnosis Date  . Anemia   . Arthritis   . Dementia   . Diabetes mellitus without complication  ([place])   . ETOH abuse   . Myocardial infarction ([place])   . Peripheral arterial disease ([place])   . Stroke ([place])   . Wears dentures   . Wears glasses     Allergies Allergies  Allergen Reactions  . Penicillins Rash    Has patient had a PCN reaction causing immediate rash, facial/tongue/throat swelling, SOB or lightheadedness with hypotension: Yes Has patient had a PCN reaction causing severe rash involving mucus membranes or skin necrosis: No Has patient had a PCN reaction that required hospitalization No Has patient had a PCN reaction occurring within the last 10 years: No If all of the above answers are "NO", then may proceed with Cephalosporin use.     IV Location/Drains/Wounds Patient Lines/Drains/Airways Status   Active Line/Drains/Airways    Name:   Placement date:   Placement time:   Site:   Days:   Peripheral IV 07/02/17 Right Hand   07/02/17    1749    Hand   less than 1   Peripheral IV 07/02/17 Right;Upper Arm   07/02/17    1813    Arm   less than 1   PICC Double Lumen 06/17/17 PICC Right Brachial 39 cm 0 cm   06/17/17    1514     15   Nephrostomy Left 10.2 Fr.   06/20/17    1651    Left   12  Nephrostomy Right 10.2 Fr.   06/20/17    1705    Right   12   Ureteral Drain/Stent Right ureter 8.5 Fr.   06/20/17    1710    Right ureter   12   Ureteral Drain/Stent Left ureter 8.5 Fr.   06/20/17    1711    Left ureter   12   Incision (Closed) 10/30/16 Groin Right   10/30/16    1631     245   Incision (Closed) 10/30/16 Foot Right   10/30/16    1631     245          Labs/Imaging Results for orders placed or performed during the hospital encounter of 07/02/17 (from the past 48 hour(s))  CBC with Differential     Status: Abnormal   Collection Time: 07/02/17  5:32 PM  Result Value Ref Range   WBC 6.3 4.0 - 10.5 K/uL   RBC 3.44 (L) 4.22 - 5.81 MIL/uL   Hemoglobin 10.1 (L) 13.0 - 17.0 g/dL   HCT 30.9 (L) 39.0 - 52.0 %   MCV 89.8 78.0 - 100.0 fL   MCH 29.4 26.0 - 34.0 pg   MCHC  32.7 30.0 - 36.0 g/dL   RDW 12.7 11.5 - 15.5 %   Platelets 262 150 - 400 K/uL   Neutrophils Relative % 62 %   Neutro Abs 3.8 1.7 - 7.7 K/uL   Lymphocytes Relative 30 %   Lymphs Abs 1.9 0.7 - 4.0 K/uL   Monocytes Relative 8 %   Monocytes Absolute 0.5 0.1 - 1.0 K/uL   Eosinophils Relative 0 %   Eosinophils Absolute 0.0 0.0 - 0.7 K/uL   Basophils Relative 0 %   Basophils Absolute 0.0 0.0 - 0.1 K/uL  Comprehensive metabolic panel     Status: Abnormal   Collection Time: 07/02/17  5:32 PM  Result Value Ref Range   Sodium 133 (L) 135 - 145 mmol/L   Potassium 3.1 (L) 3.5 - 5.1 mmol/L   Chloride 90 (L) 101 - 111 mmol/L   CO2 31 22 - 32 mmol/L   Glucose, Bld 333 (H) 65 - 99 mg/dL   BUN 15 6 - 20 mg/dL   Creatinine, Ser 0.93 0.61 - 1.24 mg/dL   Calcium 8.5 (L) 8.9 - 10.3 mg/dL   Total Protein 7.3 6.5 - 8.1 g/dL   Albumin 3.0 (L) 3.5 - 5.0 g/dL   AST 24 15 - 41 U/L   ALT 17 17 - 63 U/L   Alkaline Phosphatase 89 38 - 126 U/L   Total Bilirubin 0.4 0.3 - 1.2 mg/dL   GFR calc non Af Amer >60 >60 mL/min   GFR calc Af Amer >60 >60 mL/min    Comment: (NOTE) The eGFR has been calculated using the CKD EPI equation. This calculation has not been validated in all clinical situations. eGFR's persistently <60 mL/min signify possible Chronic Kidney Disease.    Anion gap 12 5 - 15  I-Stat CG4 Lactic Acid, ED     Status: Abnormal   Collection Time: 07/02/17  5:37 PM  Result Value Ref Range   Lactic Acid, Venous 3.59 (HH) 0.5 - 1.9 mmol/L   Comment NOTIFIED PHYSICIAN   I-Stat CG4 Lactic Acid, ED     Status: None   Collection Time: 07/02/17  8:05 PM  Result Value Ref Range   Lactic Acid, Venous 1.83 0.5 - 1.9 mmol/L  Urinalysis, Routine w reflex microscopic     Status:  Abnormal   Collection Time: 07/02/17  9:17 PM  Result Value Ref Range   Color, Urine YELLOW YELLOW   APPearance CLOUDY (A) CLEAR   Specific Gravity, Urine 1.008 1.005 - 1.030   pH 6.0 5.0 - 8.0   Glucose, UA 150 (A) NEGATIVE  mg/dL   Hgb urine dipstick MODERATE (A) NEGATIVE   Bilirubin Urine NEGATIVE NEGATIVE   Ketones, ur NEGATIVE NEGATIVE mg/dL   Protein, ur 30 (A) NEGATIVE mg/dL   Nitrite NEGATIVE NEGATIVE   Leukocytes, UA LARGE (A) NEGATIVE   RBC / HPF TOO NUMEROUS TO COUNT 0 - 5 RBC/hpf   WBC, UA TOO NUMEROUS TO COUNT 0 - 5 WBC/hpf   Bacteria, UA MANY (A) NONE SEEN   Squamous Epithelial / LPF NONE SEEN NONE SEEN   WBC Clumps PRESENT    Mucus PRESENT    Hyaline Casts, UA PRESENT    Dg Chest Portable 1 View  Result Date: 07/02/2017 CLINICAL DATA:  Weakness and shortness of breath. EXAM: PORTABLE CHEST 1 VIEW COMPARISON:  06/12/2017 chest CT FINDINGS: Interstitial coarsening attributed to interstitial lung disease with fibrosis seen on prior CT. No acute superimposed opacity. No effusion or pneumothorax. Normal heart size. IMPRESSION: 1. Mild pulmonary fibrosis. 2. No acute superimposed finding. Electronically Signed   By: Monte Fantasia M.D.   On: 07/02/2017 18:10    Pending Labs Unresulted Labs (From admission, onward)   Start     Ordered   07/09/17 0500  Creatinine, serum  (enoxaparin (LOVENOX)    CrCl >/= 30 ml/min)  Weekly,   R    Comments:  while on enoxaparin therapy    07/02/17 2309   07/03/17 2836  Basic metabolic panel  Tomorrow morning,   R     07/02/17 2309   07/03/17 0500  CBC  Tomorrow morning,   R     07/02/17 2309   07/02/17 2308  CBC  (enoxaparin (LOVENOX)    CrCl >/= 30 ml/min)  Once,   R    Comments:  Baseline for enoxaparin therapy IF NOT ALREADY DRAWN.  Notify MD if PLT < 100 K.    07/02/17 2309   07/02/17 2308  Creatinine, serum  (enoxaparin (LOVENOX)    CrCl >/= 30 ml/min)  Once,   R    Comments:  Baseline for enoxaparin therapy IF NOT ALREADY DRAWN.    07/02/17 2309   07/02/17 1732  Culture, blood (routine x 2)  BLOOD CULTURE X 2,   STAT     07/02/17 1731   07/02/17 1732  Urine culture  STAT,   STAT     07/02/17 1731      Vitals/Pain Today's Vitals   07/02/17 2015  07/02/17 2030 07/02/17 2045 07/02/17 2100  BP:  125/83  126/63  Pulse: 95 94 93 95  Resp: (!) _0 Temp:      TempSrc:      SpO2: 99% 99% 99% 98%  PainSc:        Isolation Precautions No active isolations  Medications Medications  anidulafungin (ERAXIS) 100 mg in sodium chloride 0.9 % 100 mL IVPB (not administered)  vancomycin (VANCOCIN) 1,500 mg in sodium chloride 0.9 % 500 mL IVPB (not administered)  acetaminophen (TYLENOL) tablet 650 mg (not administered)  aspirin EC tablet 81 mg (not administered)  atorvastatin (LIPITOR) tablet 10 mg (not administered)  fluticasone (FLONASE) 50 MCG/ACT nasal spray 1 spray (not administered)  insulin glargine (LANTUS) injection 44 Units (not administered)  lidocaine (LIDODERM) 5 % 1 patch (not administered)  loperamide (IMODIUM) capsule 4 mg (not administered)  LORazepam (ATIVAN) tablet 0.5 mg (not administered)  sertraline (ZOLOFT) tablet 50 mg (not administered)  tamsulosin (FLOMAX) capsule 0.4 mg (not administered)  hydrocerin (EUCERIN) cream 1 application (not administered)  traZODone (DESYREL) tablet 50 mg (not administered)  zinc oxide (BALMEX) 99.7 % cream 1 application (not administered)  enoxaparin (LOVENOX) injection 40 mg (not administered)  ondansetron (ZOFRAN) tablet 4 mg (not administered)    Or  ondansetron (ZOFRAN) injection 4 mg (not administered)  sodium chloride 0.9 % bolus 1,000 mL (0 mLs Intravenous Stopped 07/02/17 2335)  acetaminophen (TYLENOL) tablet 650 mg (650 mg Oral Given 07/02/17 1749)  sodium chloride 0.9 % bolus 1,000 mL (0 mLs Intravenous Stopped 07/02/17 1954)    And  sodium chloride 0.9 % bolus 500 mL (0 mLs Intravenous Stopped 07/02/17 2058)  anidulafungin (ERAXIS) 200 mg in sodium chloride 0.9 % 200 mL IVPB (0 mg Intravenous Stopped 07/02/17 2321)  vancomycin (VANCOCIN) 1,500 mg in sodium chloride 0.9 % 500 mL IVPB (0 mg Intravenous Stopped 07/02/17 2321)  potassium chloride SA (K-DUR,KLOR-CON) CR tablet 40  mEq (40 mEq Oral Given 07/02/17 1952)  ketorolac (TORADOL) 15 MG/ML injection 15 mg (15 mg Intravenous Given 07/02/17 2332)    Mobility non-ambulatory

## 2017-07-02 NOTE — Progress Notes (Signed)
Pharmacy Antibiotic Note  Alan SeedsGlenn A Muise Jr. is a 72 y.o. male admitted on 07/02/2017 with fever.  The patient was recently discharged on a course of IV vancomycin and anidulafungin for coag negative Staph and Candida tropicalis bacteremia that should have completed on 06/29/2017.  Pharmacy has been consulted for vancomcyin dosing.    Plan: Vancomycin 1500 mg loading dose followed by 1500 mg IV q24h for goal AUC 400-500. Daily SCr      Temp (24hrs), Avg:100.2 F (37.9 C), Min:98.7 F (37.1 C), Max:101.6 F (38.7 C)  Recent Labs  Lab 07/02/17 1732 07/02/17 1737  WBC 6.3  --   CREATININE 0.93  --   LATICACIDVEN  --  3.59*    Estimated Creatinine Clearance: 79.8 mL/min (by C-G formula based on SCr of 0.93 mg/dL).    Allergies  Allergen Reactions  . Penicillins Rash    Has patient had a PCN reaction causing immediate rash, facial/tongue/throat swelling, SOB or lightheadedness with hypotension: Yes Has patient had a PCN reaction causing severe rash involving mucus membranes or skin necrosis: No Has patient had a PCN reaction that required hospitalization No Has patient had a PCN reaction occurring within the last 10 years: No If all of the above answers are "NO", then may proceed with Cephalosporin use.     Antimicrobials this admission: 1/9 Vanc >> 1/9 Eraxis >>  Dose adjustments this admission:  Microbiology results: 1/9 BCx:  1/9 UCx:  Thank you for allowing pharmacy to be a part of this patient's care.  Clance BollRunyon, Melena Hayes 07/02/2017 7:16 PM

## 2017-07-03 ENCOUNTER — Other Ambulatory Visit: Payer: Self-pay

## 2017-07-03 DIAGNOSIS — T83512S Infection and inflammatory reaction due to nephrostomy catheter, sequela: Secondary | ICD-10-CM

## 2017-07-03 DIAGNOSIS — N132 Hydronephrosis with renal and ureteral calculous obstruction: Secondary | ICD-10-CM

## 2017-07-03 DIAGNOSIS — A419 Sepsis, unspecified organism: Secondary | ICD-10-CM

## 2017-07-03 DIAGNOSIS — G9341 Metabolic encephalopathy: Secondary | ICD-10-CM

## 2017-07-03 LAB — BASIC METABOLIC PANEL
ANION GAP: 8 (ref 5–15)
BUN: 10 mg/dL (ref 6–20)
CALCIUM: 7.8 mg/dL — AB (ref 8.9–10.3)
CHLORIDE: 101 mmol/L (ref 101–111)
CO2: 27 mmol/L (ref 22–32)
CREATININE: 0.68 mg/dL (ref 0.61–1.24)
GFR calc non Af Amer: >60 mL/min (ref >60)
Glucose, Bld: 119 mg/dL — ABNORMAL HIGH (ref 65–99)
Potassium: 3.1 mmol/L — ABNORMAL LOW (ref 3.5–5.1)
SODIUM: 136 mmol/L (ref 135–145)

## 2017-07-03 LAB — CBC
HCT: 29.5 % — ABNORMAL LOW (ref 39.0–52.0)
HEMOGLOBIN: 9.5 g/dL — AB (ref 13.0–17.0)
MCH: 29.1 pg (ref 26.0–34.0)
MCHC: 32.2 g/dL (ref 30.0–36.0)
MCV: 90.2 fL (ref 78.0–100.0)
PLATELETS: 219 10*3/uL (ref 150–400)
RBC: 3.27 MIL/uL — ABNORMAL LOW (ref 4.22–5.81)
RDW: 12.9 % (ref 11.5–15.5)
WBC: 6.2 10*3/uL (ref 4.0–10.5)

## 2017-07-03 LAB — CBG MONITORING, ED: Glucose-Capillary: 173 mg/dL — ABNORMAL HIGH (ref 65–99)

## 2017-07-03 LAB — GLUCOSE, CAPILLARY
GLUCOSE-CAPILLARY: 118 mg/dL — AB (ref 65–99)
GLUCOSE-CAPILLARY: 240 mg/dL — AB (ref 65–99)
Glucose-Capillary: 68 mg/dL (ref 65–99)
Glucose-Capillary: 101 mg/dL — ABNORMAL HIGH (ref 65–99)
Glucose-Capillary: 212 mg/dL — ABNORMAL HIGH (ref 65–99)

## 2017-07-03 MED ORDER — STARCH (THICKENING) PO POWD
ORAL | Status: DC | PRN
  Filled 2017-07-03: qty 227

## 2017-07-03 MED ORDER — HYDROCODONE-ACETAMINOPHEN 5-325 MG PO TABS
2.0000 | ORAL_TABLET | Freq: Once | ORAL | Status: AC
Start: 2017-07-03 — End: 2017-07-03
  Administered 2017-07-03: 2 via ORAL
  Filled 2017-07-03: qty 2

## 2017-07-03 MED ORDER — KETOROLAC TROMETHAMINE 15 MG/ML IJ SOLN
15.0000 mg | Freq: Four times a day (QID) | INTRAMUSCULAR | Status: DC | PRN
  Administered 2017-07-03 – 2017-07-04 (×3): 15 mg via INTRAVENOUS
  Filled 2017-07-03 (×3): qty 1

## 2017-07-03 MED ORDER — INSULIN GLARGINE 100 UNIT/ML ~~LOC~~ SOLN
20.0000 [IU] | Freq: Every day | SUBCUTANEOUS | Status: DC
  Administered 2017-07-03 – 2017-07-04 (×2): 20 [IU] via SUBCUTANEOUS
  Filled 2017-07-03 (×3): qty 0.2

## 2017-07-03 MED ORDER — KETOROLAC TROMETHAMINE 30 MG/ML IJ SOLN
30.0000 mg | Freq: Once | INTRAMUSCULAR | Status: AC
  Administered 2017-07-03: 30 mg via INTRAVENOUS
  Filled 2017-07-03: qty 1

## 2017-07-03 MED ORDER — INSULIN ASPART 100 UNIT/ML ~~LOC~~ SOLN: 0.0000 [IU] | Freq: Three times a day (TID) | SUBCUTANEOUS | Status: DC

## 2017-07-03 MED ORDER — INSULIN ASPART 100 UNIT/ML ~~LOC~~ SOLN
0.0000 [IU] | Freq: Three times a day (TID) | SUBCUTANEOUS | Status: DC
Start: 2017-07-03 — End: 2017-07-05
  Administered 2017-07-03: 3 [IU] via SUBCUTANEOUS
  Administered 2017-07-04: 2 [IU] via SUBCUTANEOUS
  Administered 2017-07-04: 1 [IU] via SUBCUTANEOUS
  Administered 2017-07-04 – 2017-07-05 (×3): 5 [IU] via SUBCUTANEOUS

## 2017-07-03 NOTE — Consult Note (Signed)
[facility]  Total days of antibiotics 2        Day 2 andiulafungin               Reason for Consult: sepsis   Referring Physician: hobbs  Active Problems:   Sepsis ([place])    HPI: Alan [name]. is a 72 y.o. male with history of PAD s/p Right common femoral endarterectomy in April 2018, hx of RLE-TM and LLE-TT amputations, DM, hx of stroke, CAD, bilateral ureteral stones where he was hospitalized in late December for moderate to severe bilateral Obstructive Uropathy c/b candidemia/fungemia where he had bilateral nephrostomies placed, and picc line for vancomycin and anidufungin to treat c.tropicalis and MRSE bacteremia which he  finished his IV abtx on 1/6. His picc line was removed on 1/7 but started to have decreased activity on the night prior to admit and on day of admit had fevers, chills, and found to have tachycardia and confusion when he was evaluated by dr Jimmye Norman, his [facility] physician. She referred patient to the ED concerning for sepsis. He was found to have temp of 101.39F no leukocytosis but UA showing pyuria. He was slated to have his bilateral nephrostomies removed./internalized on 1/14. The patient does not necessarily feel much better since being admitted last night. He feels that he has arthralgias, myalgia but mostly low abdominal pain and back pain near nephrostomy tubes. cxr per my review does not suggest infiltrate. His wife reports that his right foot TM has some slight redness to dorsum of foot  Past Medical History:  Diagnosis Date  . Anemia   . Arthritis   . Dementia   . Diabetes mellitus without complication ([place])   . ETOH abuse   . Myocardial infarction ([place])   . Peripheral arterial disease ([place])   . Stroke ([place])   . Wears dentures   . Wears glasses     Allergies:  Allergies  Allergen Reactions  . Penicillins Rash    Has patient had a PCN reaction causing immediate rash, facial/tongue/throat swelling, SOB or lightheadedness  with hypotension: Yes Has patient had a PCN reaction causing severe rash involving mucus membranes or skin necrosis: No Has patient had a PCN reaction that required hospitalization No Has patient had a PCN reaction occurring within the last 10 years: No If all of the above answers are "NO", then may proceed with Cephalosporin use.    MEDICATIONS: . acetaminophen  650 mg Oral TID  . aspirin EC  81 mg Oral Daily  . atorvastatin  10 mg Oral QPM  . enoxaparin (LOVENOX) injection  40 mg Subcutaneous QHS  . fluticasone  1 spray Each Nare Daily  . insulin aspart  0-9 Units Subcutaneous TID WC  . insulin glargine  20 Units Subcutaneous QHS  . lidocaine  1 patch Transdermal QHS  . LORazepam  0.5 mg Oral BID  . sertraline  50 mg Oral BID  . tamsulosin  0.4 mg Oral QPM  . traZODone  50 mg Oral QHS  . zinc oxide  1 application Topical 6 X Daily    Social History   Tobacco Use  . Smoking status: Former Smoker    Types: Cigarettes  . Smokeless tobacco: Never Used  . Tobacco comment: Quit smoking cigarettes 10/ 2017  Substance Use Topics  . Alcohol use: No    Comment: former  . Drug use: No    Family History  Problem Relation Age of Onset  .  Diabetes Mother   . Heart disease Father   . Heart disease Brother     Review of Systems  Constitutional: positive for fever, chills, diaphoresis, activity change, appetite change, fatigue and unexpected weight change.  HENT: Negative for congestion, sore throat, rhinorrhea, sneezing, trouble swallowing and sinus pressure.  Eyes: Negative for photophobia and visual disturbance.  Respiratory: Negative for cough, chest tightness, shortness of breath, wheezing and stridor.  Cardiovascular: Negative for chest pain, palpitations and leg swelling.  Gastrointestinal: Negative for nausea, vomiting, abdominal pain, diarrhea, constipation, blood in stool, abdominal distention and anal bleeding.  Genitourinary: Negative for dysuria, hematuria, flank pain  and difficulty urinating.  Musculoskeletal: bilateral flank pain. Negative for myalgias, back pain, joint swelling, arthralgias and gait problem.  Skin: Negative for color change, pallor, rash and wound.  Neurological: Negative for dizziness, tremors, weakness and light-headedness.  Hematological: Negative for adenopathy. Does not bruise/bleed easily.  Psychiatric/Behavioral: Negative for behavioral problems, confusion, sleep disturbance, dysphoric mood, decreased concentration and agitation.     OBJECTIVE: Temp:  [98.7 F (37.1 C)-101.6 F (38.7 C)] 98.9 F (37.2 C) (01/10 1300) Pulse Rate:  [83-109] 92 (01/10 1300) Resp:  [15-23] 18 (01/10 1300) BP: (116-151)/(54-94) 129/82 (01/10 1300) SpO2:  [96 %-100 %] 100 % (01/10 1300) Weight:  [162 lb 14.7 oz (73.9 kg)-164 lb 3.9 oz (74.5 kg)] 162 lb 14.7 oz (73.9 kg) (01/10 0416) Physical Exam  Constitutional: He is oriented to person. He appears chronically ill. Thin framed No distress.  HENT:  Mouth/Throat: Oropharynx is clear and moist. No oropharyngeal exudate. Poor dentition, pale conjunctiva Cardiovascular: Normal rate, regular rhythm and normal heart sounds. Exam reveals no gallop and no friction rub.  No murmur heard.  Pulmonary/Chest: Effort normal and breath sounds normal. No respiratory distress. He has no wheezes.  Abdominal: Soft. Bowel sounds are normal. He exhibits no distension. There is no tenderness.  Lymphadenopathy:  He has no cervical adenopathy.  gu = urinary catheter has yellow cloudy urine Skin: Slight erythema to dorsum of right foot Ext: right left TM amputation, and left leg has TT amputation Psychiatric: He has a normal mood and affect. His behavior is normal.     LABS: Results for orders placed or performed during the hospital encounter of 07/02/17 (from the past 48 hour(s))  CBC with Differential     Status: Abnormal   Collection Time: 07/02/17  5:32 PM  Result Value Ref Range   WBC 6.3 4.0 - 10.5 K/uL     RBC 3.44 (L) 4.22 - 5.81 MIL/uL   Hemoglobin 10.1 (L) 13.0 - 17.0 g/dL   HCT 30.9 (L) 39.0 - 52.0 %   MCV 89.8 78.0 - 100.0 fL   MCH 29.4 26.0 - 34.0 pg   MCHC 32.7 30.0 - 36.0 g/dL   RDW 12.7 11.5 - 15.5 %   Platelets 262 150 - 400 K/uL   Neutrophils Relative % 62 %   Neutro Abs 3.8 1.7 - 7.7 K/uL   Lymphocytes Relative 30 %   Lymphs Abs 1.9 0.7 - 4.0 K/uL   Monocytes Relative 8 %   Monocytes Absolute 0.5 0.1 - 1.0 K/uL   Eosinophils Relative 0 %   Eosinophils Absolute 0.0 0.0 - 0.7 K/uL   Basophils Relative 0 %   Basophils Absolute 0.0 0.0 - 0.1 K/uL  Comprehensive metabolic panel     Status: Abnormal   Collection Time: 07/02/17  5:32 PM  Result Value Ref Range   Sodium 133 (L) 135 - 145  mmol/L   Potassium 3.1 (L) 3.5 - 5.1 mmol/L   Chloride 90 (L) 101 - 111 mmol/L   CO2 31 22 - 32 mmol/L   Glucose, Bld 333 (H) 65 - 99 mg/dL   BUN 15 6 - 20 mg/dL   Creatinine, Ser 0.93 0.61 - 1.24 mg/dL   Calcium 8.5 (L) 8.9 - 10.3 mg/dL   Total Protein 7.3 6.5 - 8.1 g/dL   Albumin 3.0 (L) 3.5 - 5.0 g/dL   AST 24 15 - 41 U/L   ALT 17 17 - 63 U/L   Alkaline Phosphatase 89 38 - 126 U/L   Total Bilirubin 0.4 0.3 - 1.2 mg/dL   GFR calc non Af Amer >60 >60 mL/min   GFR calc Af Amer >60 >60 mL/min    Comment: (NOTE) The eGFR has been calculated using the CKD EPI equation. This calculation has not been validated in all clinical situations. eGFR's persistently <60 mL/min signify possible Chronic Kidney Disease.    Anion gap 12 5 - 15  I-Stat CG4 Lactic Acid, ED     Status: Abnormal   Collection Time: 07/02/17  5:37 PM  Result Value Ref Range   Lactic Acid, Venous 3.59 (HH) 0.5 - 1.9 mmol/L   Comment NOTIFIED PHYSICIAN   Culture, blood (routine x 2)     Status: None (Preliminary result)   Collection Time: 07/02/17  6:00 PM  Result Value Ref Range   Specimen Description      BLOOD RIGHT HAND Performed at [facility], [place] [address], [place], [place] [address]    Special  Requests      BOTTLES DRAWN AEROBIC AND ANAEROBIC Blood Culture adequate volume   Culture PENDING    Report Status PENDING   Culture, blood (routine x 2)     Status: None (Preliminary result)   Collection Time: 07/02/17  6:30 PM  Result Value Ref Range   Specimen Description      BLOOD RIGHT ARM Performed at [facility], [place] [address], [place], [place] [address]    Special Requests      BOTTLES DRAWN AEROBIC AND ANAEROBIC Blood Culture adequate volume   Culture PENDING    Report Status PENDING   I-Stat CG4 Lactic Acid, ED     Status: None   Collection Time: 07/02/17  8:05 PM  Result Value Ref Range   Lactic Acid, Venous 1.83 0.5 - 1.9 mmol/L  Urinalysis, Routine w reflex microscopic     Status: Abnormal   Collection Time: 07/02/17  9:17 PM  Result Value Ref Range   Color, Urine YELLOW YELLOW   APPearance CLOUDY (A) CLEAR   Specific Gravity, Urine 1.008 1.005 - 1.030   pH 6.0 5.0 - 8.0   Glucose, UA 150 (A) NEGATIVE mg/dL   Hgb urine dipstick MODERATE (A) NEGATIVE   Bilirubin Urine NEGATIVE NEGATIVE   Ketones, ur NEGATIVE NEGATIVE mg/dL   Protein, ur 30 (A) NEGATIVE mg/dL   Nitrite NEGATIVE NEGATIVE   Leukocytes, UA LARGE (A) NEGATIVE   RBC / HPF TOO NUMEROUS TO COUNT 0 - 5 RBC/hpf   WBC, UA TOO NUMEROUS TO COUNT 0 - 5 WBC/hpf   Bacteria, UA MANY (A) NONE SEEN   Squamous Epithelial / LPF NONE SEEN NONE SEEN   WBC Clumps PRESENT    Mucus PRESENT    Hyaline Casts, UA PRESENT   CBG monitoring, ED     Status: Abnormal   Collection Time: 07/03/17 12:02 AM  Result Value Ref Range   Glucose-Capillary 173 (H) 65 - 99 mg/dL  Basic metabolic panel     Status: Abnormal   Collection Time: 07/03/17  4:27 AM  Result Value Ref Range   Sodium 136 135 - 145 mmol/L   Potassium 3.1 (L) 3.5 - 5.1 mmol/L   Chloride 101 101 - 111 mmol/L   CO2 27 22 - 32 mmol/L   Glucose, Bld 119 (H) 65 - 99 mg/dL   BUN 10 6 - 20 mg/dL   Creatinine, Ser 0.68 0.61 - 1.24 mg/dL   Calcium 7.8 (L)  8.9 - 10.3 mg/dL   GFR calc non Af Amer >60 >60 mL/min   GFR calc Af Amer >60 >60 mL/min    Comment: (NOTE) The eGFR has been calculated using the CKD EPI equation. This calculation has not been validated in all clinical situations. eGFR's persistently <60 mL/min signify possible Chronic Kidney Disease.    Anion gap 8 5 - 15  CBC     Status: Abnormal   Collection Time: 07/03/17  4:27 AM  Result Value Ref Range   WBC 6.2 4.0 - 10.5 K/uL   RBC 3.27 (L) 4.22 - 5.81 MIL/uL   Hemoglobin 9.5 (L) 13.0 - 17.0 g/dL   HCT 29.5 (L) 39.0 - 52.0 %   MCV 90.2 78.0 - 100.0 fL   MCH 29.1 26.0 - 34.0 pg   MCHC 32.2 30.0 - 36.0 g/dL   RDW 12.9 11.5 - 15.5 %   Platelets 219 150 - 400 K/uL  Glucose, capillary     Status: None   Collection Time: 07/03/17  7:51 AM  Result Value Ref Range   Glucose-Capillary 68 65 - 99 mg/dL  Glucose, capillary     Status: Abnormal   Collection Time: 07/03/17  8:56 AM  Result Value Ref Range   Glucose-Capillary 101 (H) 65 - 99 mg/dL  Glucose, capillary     Status: Abnormal   Collection Time: 07/03/17 11:50 AM  Result Value Ref Range   Glucose-Capillary 118 (H) 65 - 99 mg/dL    MICRO: pending IMAGING: Dg Chest Portable 1 View  Result Date: 07/02/2017 CLINICAL DATA:  Weakness and shortness of breath. EXAM: PORTABLE CHEST 1 VIEW COMPARISON:  06/12/2017 chest CT FINDINGS: Interstitial coarsening attributed to interstitial lung disease with fibrosis seen on prior CT. No acute superimposed opacity. No effusion or pneumothorax. Normal heart size. IMPRESSION: 1. Mild pulmonary fibrosis. 2. No acute superimposed finding. Electronically Signed   By: Monte Fantasia M.D.   On: 07/02/2017 18:10   Assessment/Plan:  72yo M with multiple co-morbidities including hx of bilateral obstructive renal stones s/p Bilat PCN in 12/28 who just completed treatment for fungemia nad bacteremia on 10/6 readmitted for fevers, concern for SIRS, urinary infection   - continue on vancomycin and  anidulafungin. Will add ceftriaxone to cover gram negative - await culture results to see if can narrow spectrum - if repeat urine cx, recommend to ask urology to weigh in to need for changing nephrostomies, and need for repeat abd CT  Abdominal/flank pain = appears to be similar to his baseline when he was diagnosed with moderate to severe bilateral obstructive uropathy. Will follow to see if improving.  Fevers= anticipate to improve since he is back on antimicrobials.  ?cellulitis to TM of right foot = will continue to monitor.

## 2017-07-03 NOTE — Progress Notes (Addendum)
Pt's temperature 101.1 after given tylenol at 2028. On call Schorr made aware and ordered 500 mL bolus. Temperature in room turned down, blankets drawn back, and ice applied to pt. Will continue to monitor pt closely.

## 2017-07-03 NOTE — Plan of Care (Signed)
  Progressing Pain Managment: General experience of comfort will improve 07/03/2017 0219 - Progressing by Cristela FeltSteffens, Shamanda Len P, RN Safety: Ability to remain free from injury will improve 07/03/2017 0219 - Progressing by Cristela FeltSteffens, Neo Yepiz P, RN Skin Integrity: Risk for impaired skin integrity will decrease 07/03/2017 0219 - Progressing by Cristela FeltSteffens, Enos Muhl P, RN Fluid Volume: Hemodynamic stability will improve 07/03/2017 0219 - Progressing by Cristela FeltSteffens, Jye Fariss P, RN Respiratory: Ability to maintain adequate ventilation will improve 07/03/2017 0219 - Progressing by Cristela FeltSteffens, Alie Moudy P, RN

## 2017-07-03 NOTE — Progress Notes (Signed)
Pt c/o pain MD contacted orders noted. SRP, RN

## 2017-07-03 NOTE — Progress Notes (Addendum)
Pt c/o low abdominal pressure, bladder scan, scanner no result, pt continue to c/o of pressure and pain, inserted 16 Fr. Coude cath 300 of cloudy urine noted, Pt stated instant relief, wife at bedside. Will cont to monitor. MD updated. SRP, RN

## 2017-07-03 NOTE — Progress Notes (Addendum)
PROGRESS NOTE  Alan [name].  YQI:[id number] DOB: [birth year] DOA: 07/02/2017 PCP: Angelica Pou, MD  Brief Narrative:   The patient is a 72 year old male with history of diabetes mellitus, dementia, bilateral renal stones who was admitted in December with bilateral nephrolithiasis with obstruction and had bilateral nephrostomy tubes placed.  He was subsequently readmitted with sepsis and was found to have MSSA bacteremia and fungal anemia.  He was treated with IV antibiotics via PICC line which was just removed a couple days prior to this admission.  After cessation of his antibiotics, he developed high fevers, worsening confusion and lethargy and was brought back to the emergency department by his family.  He was febrile to 101.6, tachycardic, tachypneic.  His urinalysis demonstrated too numerous to count WBCs consistent with UTI.  ID was consulted and commended resuming vancomycin, anidulafungin, and they have added ceftriaxone for better gram-negative coverage pending urine culture results.  Blood cultures are pending.  Patient appears to be having some difficulty urinating and having a lot of suprapubic pressure.  We were unable to get a reading with bladder scan and placed a coud catheter which resulted in immediate relief of his discomfort but only removal of 300 mL of cloudy urine.  Case discussed with Dr. Karsten Ro who agreed with coude catheter but also asked that his nephrostomy tubes be placed back to drain and obtain a urine culture from each side.    Assessment & Plan:  Sepsis 2/2 urinary source -  Continue vancomycin and anidulafungin -  Ceftriaxone added by ID -  F/u urine and blood cultures from ED -  Place both nephrostomy tubes back to drain -  Appreciate ID and Urology assistance -  toradol prn   Acute urinary retention -  Coude catheter placed > leave in until follow up with Urology as oupatient  Acute metabolic encephalopathy superimposed on dementia -   Improving -  Minimize sedating medications   DM type 2, hypoglycemic this morning Decrease glargine to 20 units Reduce SSI AC Hold metformin  Anxiety, stable Cont zoloft BID ativan  Skin breakdown Skin care meds  Hyperlipidemia Continue statin  BPH and ureteral stones Cont flomax  Insomnia Cont trazodone  Code Status: FC DVT Prophylaxis: lovenox Family Communication: wife at bedside Disposition Plan: Pending Improvement   Consultants:   ID  Spoke with Dr. Karsten Ro by phone  Procedures:  none  Antimicrobials:  Anti-infectives (From admission, onward)   Start     Dose/Rate Route Frequency Ordered Stop   07/03/17 2000  vancomycin (VANCOCIN) 1,500 mg in sodium chloride 0.9 % 500 mL IVPB     1,500 mg 250 mL/hr over 120 Minutes Intravenous Every 24 hours 07/02/17 1926     07/03/17 1800  anidulafungin (ERAXIS) 100 mg in sodium chloride 0.9 % 100 mL IVPB  Status:  Discontinued     100 mg 78 mL/hr over 100 Minutes Intravenous Every 24 hours 07/02/17 1809 07/02/17 1810   07/03/17 1800  anidulafungin (ERAXIS) 100 mg in sodium chloride 0.9 % 100 mL IVPB     100 mg 78 mL/hr over 100 Minutes Intravenous Every 24 hours 07/02/17 1810     07/02/17 2315  anidulafungin (ERAXIS) IVPB  Status:  Discontinued    Comments:  Indication: candidemia Last Day of Therapy:  06/29/17 Labs - Once weekly:  CBC/D and BMP, Labs - Every other week:  ESR and CRP     100 mg Intravenous Every 24 hours 07/02/17 2309 07/02/17 2315  07/02/17 2315  vancomycin IVPB  Status:  Discontinued    Comments:  Indication:  bacteremia Last Day of Therapy:  06/29/17 Labs - _0 0 mg 78 mL/hr over 200 Minutes Intravenous  Once 07/02/17 1811 07/02/17 2321   07/02/17 1815  vancomycin (VANCOCIN) 1,500 mg in sodium chloride 0.9 % 500 mL IVPB     1,500 mg 250 mL/hr over 120 Minutes Intravenous  Once 07/02/17 1813 07/02/17 2321       Subjective:  Ongoing abdominal pains and some confusion, but more alert today according to wife.  Hx limited by patient's AMS and dementia, but describes pain that radiates across front of abdomen and pressure in suprapubic area.    Objective: Vitals:   07/03/17 0035 07/03/17 0415 07/03/17 0416 07/03/17 1300  BP: (!) 116/54 140/68  129/82  Pulse: 84 83  92  Resp: _1 Temp: 99.4 F (37.4 C) 98.9 F (37.2 C)  98.9 F (37.2 C)  TempSrc: Oral Oral  Oral  SpO2: 98% 100%  100%  Weight: 74.5 kg (164 lb 3.9 oz)  73.9 kg (162 lb 14.7 oz)   Height: 6' (1.829 m)       Intake/Output Summary (Last 24 hours) at 07/03/2017 1828 Last data filed at 07/03/2017 1340 Gross per 24 hour  Intake 2980 ml  Output 1275 ml  Net 1705 ml   Filed Weights   07/03/17 0035 07/03/17 0416  Weight: 74.5 kg (164 lb 3.9 oz) 73.9 kg (162 lb 14.7 oz)    Examination:  General exam:  Adult male.  No acute distress.  HEENT:  NCAT, MMM Respiratory system: Clear to auscultation bilaterally Cardiovascular system: Regular rate and rhythm, normal S1/S2. No murmurs, rubs, gallops or clicks.  Warm extremities Gastrointestinal system: Normal active bowel sounds, soft, nondistended, TTP diffusely and fullness noted in the suprapubic area concerning for distended bladder MSK:  Normal tone and bulk, no lower extremity  edema.  Left BKA Neuro:  Grossly intact    Data Reviewed: I have personally reviewed following labs and imaging studies  CBC: Recent Labs  Lab 07/02/17 1732 07/03/17 0427  WBC 6.3 6.2  NEUTROABS 3.8  --   HGB 10.1* 9.5*  HCT 30.9* 29.5*  MCV 89.8 90.2  PLT 262 507   Basic Metabolic Panel: Recent Labs  Lab 07/02/17 1732 07/03/17 0427  NA 133* 136  K 3.1* 3.1*  CL 90* 101  CO2 31 27  GLUCOSE 333* 119*  BUN 15 10  CREATININE 0.93 0.68  CALCIUM 8.5* 7.8*   GFR: Estimated Creatinine Clearance: 91.1 mL/min (by C-G formula based on SCr of 0.68 mg/dL). Liver Function Tests: Recent Labs  Lab 07/02/17  1732  AST 24  ALT 17  ALKPHOS 89  BILITOT 0.4  PROT 7.3  ALBUMIN 3.0*   No results for input(s): LIPASE, AMYLASE in the last 168 hours. No results for input(s): AMMONIA in the last 168 hours. Coagulation Profile: No results for input(s): INR, PROTIME in the last 168 hours. Cardiac Enzymes: No results for input(s): CKTOTAL, CKMB, CKMBINDEX, TROPONINI in the last 168 hours. BNP (last 3 results) No results for input(s): PROBNP in the last 8760 hours. HbA1C: No results for input(s): HGBA1C in the last 72 hours. CBG: Recent Labs  Lab 07/03/17 0002 07/03/17 0751 07/03/17 0856 07/03/17 1150 07/03/17 1713  GLUCAP 173* 68 101* 118* 212*   Lipid Profile: No results for input(s): CHOL, HDL, LDLCALC, TRIG, CHOLHDL, LDLDIRECT in the last 72 hours. Thyroid Function Tests: No results for input(s): TSH, T4TOTAL, FREET4, T3FREE, THYROIDAB in the last 72 hours. Anemia Panel: No results for input(s): VITAMINB12, FOLATE, FERRITIN, TIBC, IRON, RETICCTPCT in the last 72 hours. Urine analysis:    Component Value Date/Time   COLORURINE YELLOW 07/02/2017 2117   APPEARANCEUR CLOUDY (A) 07/02/2017 2117   LABSPEC 1.008 07/02/2017 2117   PHURINE 6.0 07/02/2017 2117   GLUCOSEU 150 (A) 07/02/2017 2117   HGBUR MODERATE (A) 07/02/2017 2117   BILIRUBINUR NEGATIVE 07/02/2017 2117    [place] NEGATIVE 07/02/2017 2117   PROTEINUR 30 (A) 07/02/2017 2117   NITRITE NEGATIVE 07/02/2017 2117   LEUKOCYTESUR LARGE (A) 07/02/2017 2117   Sepsis Labs: _0 (procalcitonin:4,lacticidven:4)  ) Recent Results (from the past 240 hour(s))  Culture, blood (routine x 2)     Status: None (Preliminary result)   Collection Time: 07/02/17  6:00 PM  Result Value Ref Range Status   Specimen Description BLOOD RIGHT HAND  Final   Special Requests   Final    BOTTLES DRAWN AEROBIC AND ANAEROBIC Blood Culture adequate volume   Culture   Final    NO GROWTH < 24 HOURS Performed at [facility], [place] [address] [place], [place] [address]    Report Status PENDING  Incomplete  Culture, blood (routine x 2)     Status: None (Preliminary result)   Collection Time: 07/02/17  6:30 PM  Result Value Ref Range Status   Specimen Description BLOOD RIGHT ARM  Final   Special Requests   Final    BOTTLES DRAWN AEROBIC AND ANAEROBIC Blood Culture adequate volume   Culture   Final    NO GROWTH < 24 HOURS Performed at [facility], [place] [address] [place], [place] [address]    Report Status PENDING  Incomplete      Radiology Studies: Dg Chest Portable 1 View  Result Date: 07/02/2017 CLINICAL DATA:  Weakness and shortness of breath. EXAM: PORTABLE CHEST 1 VIEW COMPARISON:  06/12/2017 chest CT FINDINGS: Interstitial coarsening attributed to interstitial lung disease with fibrosis seen on prior CT. No acute superimposed opacity. No effusion or pneumothorax. Normal heart size. IMPRESSION: 1. Mild pulmonary fibrosis. 2. No acute superimposed finding. Electronically Signed   By: Monte Fantasia M.D.   On: 07/02/2017 18:10     Scheduled Meds: . acetaminophen  650 mg Oral TID  . aspirin EC  81 mg Oral Daily  . atorvastatin  10 mg Oral QPM  . enoxaparin (LOVENOX) injection  40 mg Subcutaneous QHS  . fluticasone  1 spray Each Nare Daily  . insulin aspart  0-9 Units Subcutaneous TID WC  .  insulin glargine  20 Units Subcutaneous QHS  .  lidocaine  1 patch Transdermal QHS  . LORazepam  0.5 mg Oral BID  . sertraline  50 mg Oral BID  . tamsulosin  0.4 mg Oral QPM  . traZODone  50 mg Oral QHS  . zinc oxide  1 application Topical 6 X Daily   Continuous Infusions: . anidulafungin 100 mg (07/03/17 1748)  . vancomycin       LOS: 1 day    Time spent: 30 min    Janece Canterbury, MD [facility] Pager [phone]  If 7PM-7AM, please contact night-coverage [email or web address] Password [facility] 07/03/2017, 6:28 PM

## 2017-07-03 NOTE — Progress Notes (Signed)
Hypoglycemic Event  CBG: 68  Treatment:  Orange juice and breakfast  Symptoms: non symptomatic  Follow-up CBG: Time:0830 CBG Result:110 Possible Reasons for Event:  Pt did not eat dinner or snack and received maintenance insulin  Comments/MD notified: no pt better after breakfast and non symptomatic.   Haynes Dageickett, Tavish Gettis R

## 2017-07-04 ENCOUNTER — Encounter (HOSPITAL_COMMUNITY): Payer: Self-pay | Admitting: Diagnostic Radiology

## 2017-07-04 ENCOUNTER — Inpatient Hospital Stay (HOSPITAL_COMMUNITY): Payer: Medicare (Managed Care)

## 2017-07-04 DIAGNOSIS — N200 Calculus of kidney: Secondary | ICD-10-CM

## 2017-07-04 DIAGNOSIS — Y838 Other surgical procedures as the cause of abnormal reaction of the patient, or of later complication, without mention of misadventure at the time of the procedure: Secondary | ICD-10-CM

## 2017-07-04 DIAGNOSIS — N390 Urinary tract infection, site not specified: Secondary | ICD-10-CM

## 2017-07-04 DIAGNOSIS — Z936 Other artificial openings of urinary tract status: Secondary | ICD-10-CM

## 2017-07-04 DIAGNOSIS — N201 Calculus of ureter: Secondary | ICD-10-CM

## 2017-07-04 DIAGNOSIS — B961 Klebsiella pneumoniae [K. pneumoniae] as the cause of diseases classified elsewhere: Secondary | ICD-10-CM

## 2017-07-04 DIAGNOSIS — T83512D Infection and inflammatory reaction due to nephrostomy catheter, subsequent encounter: Secondary | ICD-10-CM

## 2017-07-04 HISTORY — PX: IR NEPHRO TUBE REMOV/FL: IMG2342

## 2017-07-04 LAB — CBC
HCT: 25.2 % — ABNORMAL LOW (ref 39.0–52.0)
Hemoglobin: 8.5 g/dL — ABNORMAL LOW (ref 13.0–17.0)
MCH: 30 pg (ref 26.0–34.0)
MCHC: 33.7 g/dL (ref 30.0–36.0)
MCV: 89 fL (ref 78.0–100.0)
PLATELETS: 198 10*3/uL (ref 150–400)
RBC: 2.83 MIL/uL — AB (ref 4.22–5.81)
RDW: 12.8 % (ref 11.5–15.5)
WBC: 8.8 10*3/uL (ref 4.0–10.5)

## 2017-07-04 LAB — BASIC METABOLIC PANEL
Anion gap: 6 (ref 5–15)
BUN: 10 mg/dL (ref 6–20)
CHLORIDE: 97 mmol/L — AB (ref 101–111)
CO2: 31 mmol/L (ref 22–32)
CREATININE: 0.71 mg/dL (ref 0.61–1.24)
Calcium: 7.8 mg/dL — ABNORMAL LOW (ref 8.9–10.3)
GFR calc Af Amer: >60 mL/min (ref >60)
GFR calc non Af Amer: >60 mL/min (ref >60)
GLUCOSE: 139 mg/dL — AB (ref 65–99)
Potassium: 2.8 mmol/L — ABNORMAL LOW (ref 3.5–5.1)
Sodium: 134 mmol/L — ABNORMAL LOW (ref 135–145)

## 2017-07-04 LAB — GLUCOSE, CAPILLARY
GLUCOSE-CAPILLARY: 277 mg/dL — AB (ref 65–99)
Glucose-Capillary: 126 mg/dL — ABNORMAL HIGH (ref 65–99)
Glucose-Capillary: 155 mg/dL — ABNORMAL HIGH (ref 65–99)
Glucose-Capillary: 311 mg/dL — ABNORMAL HIGH (ref 65–99)

## 2017-07-04 LAB — MAGNESIUM: Magnesium: 1.3 mg/dL — ABNORMAL LOW (ref 1.7–2.4)

## 2017-07-04 MED ORDER — IOPAMIDOL (ISOVUE-300) INJECTION 61%
INTRAVENOUS | Status: AC
  Administered 2017-07-04: 5 mL
  Filled 2017-07-04: qty 50

## 2017-07-04 MED ORDER — POTASSIUM CHLORIDE CRYS ER 20 MEQ PO TBCR
40.0000 meq | EXTENDED_RELEASE_TABLET | ORAL | Status: AC
  Administered 2017-07-04 (×3): 40 meq via ORAL
  Filled 2017-07-04 (×3): qty 2

## 2017-07-04 MED ORDER — CEFTRIAXONE SODIUM 1 G IJ SOLR
1.0000 g | INTRAMUSCULAR | Status: DC
  Administered 2017-07-04: 1 g via INTRAMUSCULAR
  Filled 2017-07-04 (×2): qty 10

## 2017-07-04 MED ORDER — CEPHALEXIN 500 MG PO CAPS: 500.0000 mg | ORAL_CAPSULE | Freq: Two times a day (BID) | ORAL | Status: DC

## 2017-07-04 MED ORDER — IBUPROFEN 800 MG PO TABS: 800.0000 mg | ORAL_TABLET | Freq: Four times a day (QID) | ORAL | Status: DC | PRN

## 2017-07-04 MED ORDER — BISACODYL 10 MG RE SUPP
10.0000 mg | Freq: Once | RECTAL | Status: AC
  Administered 2017-07-04: 10 mg via RECTAL

## 2017-07-04 MED ORDER — MAGNESIUM SULFATE IN D5W 1-5 GM/100ML-% IV SOLN
1.0000 g | Freq: Once | INTRAVENOUS | Status: DC
  Filled 2017-07-04: qty 100

## 2017-07-04 MED ORDER — SENNA 8.6 MG PO TABS
2.0000 | ORAL_TABLET | Freq: Every day | ORAL | Status: DC
  Administered 2017-07-04 – 2017-07-05 (×2): 17.2 mg via ORAL
  Filled 2017-07-04 (×3): qty 2

## 2017-07-04 MED ORDER — CEFTRIAXONE SODIUM 2 G IJ SOLR
2.0000 g | INTRAMUSCULAR | Status: DC
  Filled 2017-07-04: qty 2

## 2017-07-04 MED ORDER — POLYETHYLENE GLYCOL 3350 17 G PO PACK
17.0000 g | PACK | Freq: Two times a day (BID) | ORAL | Status: DC
  Administered 2017-07-04 – 2017-07-07 (×6): 17 g via ORAL
  Filled 2017-07-04 (×8): qty 1

## 2017-07-04 MED ORDER — MAGNESIUM OXIDE 400 (241.3 MG) MG PO TABS
400.0000 mg | ORAL_TABLET | Freq: Two times a day (BID) | ORAL | Status: DC
  Administered 2017-07-04 – 2017-07-07 (×7): 400 mg via ORAL
  Filled 2017-07-04 (×7): qty 1

## 2017-07-04 MED ORDER — SODIUM CHLORIDE 0.9 % IV BOLUS (SEPSIS)
500.0000 mL | Freq: Once | INTRAVENOUS | Status: AC
  Administered 2017-07-04: 500 mL via INTRAVENOUS

## 2017-07-04 NOTE — Progress Notes (Signed)
[facility]    Date of Admission:  07/02/2017   Total days of antibiotics 3        Day 1 ceftriaxone IM           ID: Alan [name]. is a 72 y.o. male with  Hx of bilateral ureteral stones s/p bilateral nephrostomies and bilateral double J stents recently treated for CoNS and Candidal associated bacteremia but then represented with sepsis due to urinary source  with kleb pneumonaie complicated UTI Active Problems:   Sepsis (HCC)    Subjective: tmax of 101.5 yesterday afternoon but feeling better now. Lost PIV access thus received rocephin IM today. He was evaluated by urology who felt his right nephrostomy could be removed. He tolerated procedure without difficulty  Medications:  . acetaminophen  650 mg Oral TID  . aspirin EC  81 mg Oral Daily  . atorvastatin  10 mg Oral QPM  . cefTRIAXone (ROCEPHIN) IM  1 g Intramuscular Q24H  . enoxaparin (LOVENOX) injection  40 mg Subcutaneous QHS  . fluticasone  1 spray Each Nare Daily  . insulin aspart  0-9 Units Subcutaneous TID WC  . insulin glargine  20 Units Subcutaneous QHS  . lidocaine  1 patch Transdermal QHS  . LORazepam  0.5 mg Oral BID  . magnesium oxide  400 mg Oral BID  . polyethylene glycol  17 g Oral BID  . potassium chloride  40 mEq Oral Q4H  . senna  2 tablet Oral QHS  . sertraline  50 mg Oral BID  . tamsulosin  0.4 mg Oral QPM  . traZODone  50 mg Oral QHS  . zinc oxide  1 application Topical 6 X Daily    Objective: Vital signs in last 24 hours: Temp:  [99 F (37.2 C)-101.5 F (38.6 C)] 99 F (37.2 C) (01/11 0636) Pulse Rate:  [95-98] 98 (01/11 0636) Resp:  [18] 18 (01/11 0636) BP: (106-134)/(65-68) 106/65 (01/11 0636) SpO2:  [96 %-98 %] 98 % (01/11 0636) Weight:  [167 lb 3.2 oz (75.8 kg)] 167 lb 3.2 oz (75.8 kg) (01/11 0636) Physical Exam  Constitutional: He is oriented to person, place, and time. He appears well-developed and well-nourished. No distress.  HENT:  Mouth/Throat:  Oropharynx is clear and moist. No oropharyngeal exudate.  Cardiovascular: Normal rate, regular rhythm and normal heart sounds. Exam reveals no gallop and no friction rub.  No murmur heard.  Pulmonary/Chest: Effort normal and breath sounds normal. No respiratory distress. He has no wheezes.  Abdominal: Soft. Bowel sounds are normal. He exhibits no distension. There is no tenderness.  gu = foley in place, and left nephrostomy showing yellow clear urine Neurological: He is alert and oriented to person, place, and time.  Skin: Skin is warm and dry. No rash noted. No erythema.  Psychiatric: He has a normal mood and affect. His behavior is normal.    Lab Results Recent Labs    07/03/17 0427 07/04/17 0806  WBC 6.2 8.8  HGB 9.5* 8.5*  HCT 29.5* 25.2*  NA 136 134*  K 3.1* 2.8*  CL 101 97*  CO2 27 31  BUN 10 10  CREATININE 0.68 0.71   Liver Panel Recent Labs    07/02/17 1732  PROT 7.3  ALBUMIN 3.0*  AST 24  ALT 17  ALKPHOS 89  BILITOT 0.4    Microbiology: Blood cx NGTD at 48hr Urine cx kleb pneumoniae  Studies/Results: Dg Abd 1 View  Result Date: 07/04/2017  CLINICAL DATA:  Left ureteral calculus EXAM: ABDOMEN - 1 VIEW COMPARISON:  CT abdomen pelvis 06/12/2017 FINDINGS: Bilateral ureteral stents in good position extending into the bladder. Bilateral nephrostomy tubes in good position overlying the proximal stent bilaterally. No ureteral calculus identified. Note is made of small renal calculi on the prior CT Vertebroplasty at L1.  Constipation.  Normal bowel gas pattern. IMPRESSION: Bilateral ureteral stents in bilateral nephrostomies in good position. No urinary tract calculi identified. Constipation Electronically Signed   By: Marlan Palauharles  Clark M.D.   On: 07/04/2017 09:29   Ir Nephro Tube Remov/fl  Result Date: 07/04/2017 INDICATION: History of hydronephrosis and obstructing ureter stones. Patient has bilateral percutaneous nephrostomy tubes and bilateral ureter stents. Plan for  removal of the right nephrostomy tube. EXAM: REMOVAL OF RIGHT NEPHROSTOMY TUBE WITH FLUOROSCOPIC GUIDANCE COMPARISON:  None. MEDICATIONS: None ANESTHESIA/SEDATION: None CONTRAST:  5 mL Isovue-300-administered into the collecting system(s) FLUOROSCOPY TIME:  Fluoroscopy Time: 42 seconds, 10 mGy COMPLICATIONS: None immediate. PROCEDURE: Patient was placed prone on the interventional table. The right flank and existing catheter were prepped and draped in a sterile fashion. Maximal barrier sterile technique was utilized including caps, mask, sterile gowns, sterile gloves, sterile drape, hand hygiene and skin antiseptic. Contrast was injected through the existing nephrostomy tube. The nephrostomy tube was cut and removed using a Bentson wire. Catheter was removed under fluoroscopic guidance in order to avoid dislodging the ureter stent. The nephrostomy tube was successfully removed without complication. Bandage placed at the old puncture site. FINDINGS: Right nephrostomy tube within the right renal collecting system. Right ureter stent is adequately positioned in the renal pelvis and urinary bladder. Nephrostogram demonstrated a filling defect in the renal pelvis probably representing blood clot because this is larger than the previous ureter stone. Contrast rapidly fills the ureter stent and drains into the urinary bladder. The ureter stent is widely patent. Nephrostomy tube was successfully removed without dislodging the ureter stent. IMPRESSION: Patent right ureter stent. Successful removal of the right nephrostomy tube with fluoroscopic guidance. Filling defect in the right renal pelvis probably related to blood clot. Electronically Signed   By: Richarda OverlieAdam  Henn M.D.   On: 07/04/2017 16:35   Dg Chest Portable 1 View  Result Date: 07/02/2017 CLINICAL DATA:  Weakness and shortness of breath. EXAM: PORTABLE CHEST 1 VIEW COMPARISON:  06/12/2017 chest CT FINDINGS: Interstitial coarsening attributed to interstitial lung  disease with fibrosis seen on prior CT. No acute superimposed opacity. No effusion or pneumothorax. Normal heart size. IMPRESSION: 1. Mild pulmonary fibrosis. 2. No acute superimposed finding. Electronically Signed   By: Marnee SpringJonathon  Watts M.D.   On: 07/02/2017 18:10     Assessment/Plan: 72yo M with bilateral ureteral obstruction c/b infection s/p bilateral nephrostomy readmitted for sepsis due to urinary source  uti = found to have kleb pneumonaie. Recommend to follow up on susceptibilities that should be out tomorrow to ensure that doing 10-14 day course of cephalexin 500mg  QID vs bactrim DS 1 BID (would wait to see if sensitive to cephalosporins vs sulfa). Would avoid giving fluoroquinolones to minimize risk for cdifficile. He has received a dose of IM ceftriaxone today. Start oral treatment tomorrow. Urology is following patient to remove his 2nd nephrostomy as an outpatient  Will sign off.  [facility] RehabilitationCynthia Amaree Leeper [facility] Cell: [phone] Pager: [phone]  07/04/2017, 4:57 PM

## 2017-07-04 NOTE — Progress Notes (Signed)
PROGRESS NOTE  Alan [name].  QPY:[id number] DOB: [birth year] DOA: 07/02/2017 PCP: Angelica Pou, MD  Brief Narrative:   The patient is a 72 year old male with history of diabetes mellitus, dementia, bilateral renal stones who was admitted in December with bilateral nephrolithiasis with obstruction and had bilateral nephrostomy tubes placed.  He was subsequently readmitted with sepsis and was found to have MSSA bacteremia and fungal anemia.  He was treated with IV antibiotics via PICC line which was just removed a couple days prior to this admission.  After cessation of his antibiotics, he developed high fevers, worsening confusion and lethargy and was brought back to the emergency department by his family.  He was febrile to 101.6, tachycardic, tachypneic.  His urinalysis demonstrated too numerous to count WBCs consistent with UTI.  ID was consulted and commended resuming vancomycin, anidulafungin, and they have added ceftriaxone for better gram-negative coverage pending urine culture results.  Blood cultures are pending.  Patient appears to be having some difficulty urinating and having a lot of suprapubic pressure.  We were unable to get a reading with bladder scan and placed a coud catheter which resulted in immediate relief of his discomfort but only removal of 300 mL of cloudy urine.  Case discussed with Dr. Karsten Ro who agreed with coude catheter but also asked that his nephrostomy tubes be placed back to drain and obtain a urine culture from each side.  Using IM ceftriaxone pending culture data as patient is a hard stick and may not need a PICC line.    Assessment & Plan:  Sepsis 2/2 Klebsiella CAUTI, present at time of admission -  vancomycin and anidulafungin discontinued  -  Change Ceftriaxone to IM  -  F/u urine sensitivities from bladder culture -  Nephrostomy cultures are still pending -  Blood cultures NGTD -  Appreciate ID and Urology assistance -  toradol prn  -  Right  nephrostomy removed by radiology on 1/11.  Acute urinary retention -  Coude catheter placed > leave in until follow up with Urology as oupatient  Acute metabolic encephalopathy superimposed on dementia -  Improving -  Minimize sedating medications   DM type 2, CBG mildly elevated Continue glargine to 20 units Continue SSI AC Hold metformin  Anxiety, stable Cont zoloft BID ativan  Skin breakdown Skin care meds  Hyperlipidemia Continue statin  BPH and ureteral stones Cont flomax  Insomnia Cont trazodone  Constipation on XR:  Large soft stool this morning -  Decrease stool softeners  Hypokalemia, oral potassium 48mq q4h x 3 -  Check magnesium level:  1.3 -  Unable to give IV, lack of PIV.  Start magnesium oxide  Code Status: FC DVT Prophylaxis: lovenox Family Communication:  patient alone Disposition Plan: Pending culture data.  Urology to determine whether second nephrostomy tube can be removed tomorrow   Consultants:   ID  Urology, Dr. OKarsten Ro Procedures:  none  Antimicrobials:  Anti-infectives (From admission, onward)   Start     Dose/Rate Route Frequency Ordered Stop   07/04/17 1400  cephALEXin (KEFLEX) capsule 500 mg  Status:  Discontinued     500 mg Oral 2 times daily 07/04/17 1137 07/04/17 1227   07/04/17 1330  cefTRIAXone (ROCEPHIN) injection 1 g     1 g Intramuscular Every 24 hours 07/04/17 1312     07/04/17 1230  cephALEXin (KEFLEX) capsule 500 mg  Status:  Discontinued     500 mg Oral 2 times daily 07/04/17 1227  07/04/17 1230   07/04/17 1000  cefTRIAXone (ROCEPHIN) 2 g in dextrose 5 % 50 mL IVPB  Status:  Discontinued     2 g 100 mL/hr over 30 Minutes Intravenous Every 24 hours 07/04/17 0929 07/04/17 1137   07/03/17 2000  vancomycin (VANCOCIN) 1,500 mg in sodium chloride 0.9 % 500 mL IVPB  Status:  Discontinued     1,500 mg 250 mL/hr over 120 Minutes Intravenous Every 24 hours 07/02/17 1926 07/04/17 1045   07/03/17 1800   anidulafungin (ERAXIS) 100 mg in sodium chloride 0.9 % 100 mL IVPB  Status:  Discontinued     100 mg 78 mL/hr over 100 Minutes Intravenous Every 24 hours 07/02/17 1809 07/02/17 1810   07/03/17 1800  anidulafungin (ERAXIS) 100 mg in sodium chloride 0.9 % 100 mL IVPB  Status:  Discontinued     100 mg 78 mL/hr over 100 Minutes Intravenous Every 24 hours 07/02/17 1810 07/04/17 1045   07/02/17 2315  anidulafungin (ERAXIS) IVPB  Status:  Discontinued    Comments:  Indication: candidemia Last Day of Therapy:  06/29/17 Labs - Once weekly:  CBC/D and BMP, Labs - Every other week:  ESR and CRP     100 mg Intravenous Every 24 hours 07/02/17 2309 07/02/17 2315   07/02/17 2315  vancomycin IVPB  Status:  Discontinued    Comments:  Indication:  bacteremia Last Day of Therapy:  06/29/17 Labs - 'Sunday/Monday:  CBC/D, BMP, and vancomycin trough. Labs - Thursday:  BMP and vancomycin trough Labs - Every other week:  ESR and CRP     1,250 mg Intravenous Every 24 hours 07/02/17 2309 07/02/17 2315   07/02/17 1815  anidulafungin (ERAXIS) 200 mg in sodium chloride 0.9 % 200 mL IVPB  Status:  Discontinued     200 mg 78 mL/hr over 200 Minutes Intravenous Every 24 hours 07/02/17 1809 07/02/17 1810   07/02/17 1815  anidulafungin (ERAXIS) 200 mg in sodium chloride 0.9 % 200 mL IVPB  Status:  Discontinued     200 mg 78 mL/hr over 200 Minutes Intravenous Every 24 hours 07/02/17 1810 07/02/17 1812   07/02/17 1815  anidulafungin (ERAXIS) 200 mg in sodium chloride 0.9 % 200 mL IVPB     20'$ 0 mg 78 mL/hr over 200 Minutes Intravenous  Once 07/02/17 1811 07/02/17 2321   07/02/17 1815  vancomycin (VANCOCIN) 1,500 mg in sodium chloride 0.9 % 500 mL IVPB     1,500 mg 250 mL/hr over 120 Minutes Intravenous  Once 07/02/17 1813 07/02/17 2321       Subjective:  Denies abdominal pains, SOB, flank pains.  Patient denies fevers and chills, but he had recorded fevers to 101.7F last night.  History limited by memory loss/dementia.       Objective: Vitals:   07/03/17 1300 07/03/17 2133 07/03/17 2322 07/04/17 0636  BP: 129/82 134/68  106/65  Pulse: 92 95  98  Resp: '18 18  18  '$ Temp: 98.9 F (37.2 C) (!) 101.5 F (38.6 C) (!) 101.1 F (38.4 C) 99 F (37.2 C)  TempSrc: Oral Oral Oral Oral  SpO2: 100% 96%  98%  Weight:    75.8 kg (167 lb 3.2 oz)  Height:        Intake/Output Summary (Last 24 hours) at 07/04/2017 1628 Last data filed at 07/04/2017 1342 Gross per 24 hour  Intake 990 ml  Output 3625 ml  Net -2635 ml   Filed Weights   07/03/17 0035 07/03/17 0416 07/04/17 0636  Weight:  74.5 kg (164 lb 3.9 oz) 73.9 kg (162 lb 14.7 oz) 75.8 kg (167 lb 3.2 oz)    Examination:  General exam:  Adult male.  No acute distress.  HEENT:  NCAT, MMM Respiratory system: Clear to auscultation bilaterally Cardiovascular system: Regular rate and rhythm, normal S1/S2. No murmurs, rubs, gallops or clicks.  Warm extremities Gastrointestinal system: Normal active bowel sounds, soft, nondistended, nontender. MSK:  Normal tone and bulk, left BKA Neuro:  Grossly intact   Data Reviewed: I have personally reviewed following labs and imaging studies  CBC: Recent Labs  Lab 07/02/17 1732 07/03/17 0427 07/04/17 0806  WBC 6.3 6.2 8.8  NEUTROABS 3.8  --   --   HGB 10.1* 9.5* 8.5*  HCT 30.9* 29.5* 25.2*  MCV 89.8 90.2 89.0  PLT 262 219 960   Basic Metabolic Panel: Recent Labs  Lab 07/02/17 1732 07/03/17 0427 07/04/17 0806  NA 133* 136 134*  K 3.1* 3.1* 2.8*  CL 90* 101 97*  CO2 '31 27 31  '$ GLUCOSE 333* 119* 139*  BUN '15 10 10  '$ CREATININE 0.93 0.68 0.71  CALCIUM 8.5* 7.8* 7.8*  MG  --   --  1.3*   GFR: Estimated Creatinine Clearance: 93.4 mL/min (by C-G formula based on SCr of 0.71 mg/dL). Liver Function Tests: Recent Labs  Lab 07/02/17 1732  AST 24  ALT 17  ALKPHOS 89  BILITOT 0.4  PROT 7.3  ALBUMIN 3.0*   No results for input(s): LIPASE, AMYLASE in the last 168 hours. No results for input(s): AMMONIA in  the last 168 hours. Coagulation Profile: No results for input(s): INR, PROTIME in the last 168 hours. Cardiac Enzymes: No results for input(s): CKTOTAL, CKMB, CKMBINDEX, TROPONINI in the last 168 hours. BNP (last 3 results) No results for input(s): PROBNP in the last 8760 hours. HbA1C: No results for input(s): HGBA1C in the last 72 hours. CBG: Recent Labs  Lab 07/03/17 1150 07/03/17 1713 07/03/17 2129 07/04/17 0819 07/04/17 1200  GLUCAP 118* 212* 240* 126* 155*   Lipid Profile: No results for input(s): CHOL, HDL, LDLCALC, TRIG, CHOLHDL, LDLDIRECT in the last 72 hours. Thyroid Function Tests: No results for input(s): TSH, T4TOTAL, FREET4, T3FREE, THYROIDAB in the last 72 hours. Anemia Panel: No results for input(s): VITAMINB12, FOLATE, FERRITIN, TIBC, IRON, RETICCTPCT in the last 72 hours. Urine analysis:    Component Value Date/Time   COLORURINE YELLOW 07/02/2017 2117   APPEARANCEUR CLOUDY (A) 07/02/2017 2117   LABSPEC 1.008 07/02/2017 2117   PHURINE 6.0 07/02/2017 2117   GLUCOSEU 150 (A) 07/02/2017 2117   HGBUR MODERATE (A) 07/02/2017 2117   BILIRUBINUR NEGATIVE 07/02/2017 2117   [place] NEGATIVE 07/02/2017 2117   PROTEINUR 30 (A) 07/02/2017 2117   NITRITE NEGATIVE 07/02/2017 2117   LEUKOCYTESUR LARGE (A) 07/02/2017 2117   Sepsis Labs: '@LABRCNTIP'$ (procalcitonin:4,lacticidven:4)  ) Recent Results (from the past 240 hour(s))  Culture, blood (routine x 2)     Status: None (Preliminary result)   Collection Time: 07/02/17  6:00 PM  Result Value Ref Range Status   Specimen Description BLOOD RIGHT HAND  Final   Special Requests   Final    BOTTLES DRAWN AEROBIC AND ANAEROBIC Blood Culture adequate volume   Culture   Final    NO GROWTH 2 DAYS Performed at [facility], [place] [address] [place],  [address]    Report Status PENDING  Incomplete  Culture, blood (routine x 2)     Status: None (Preliminary result)   Collection  Time: 07/02/17  6:30 PM  Result  Value Ref Range Status   Specimen Description BLOOD RIGHT ARM  Final   Special Requests   Final    BOTTLES DRAWN AEROBIC AND ANAEROBIC Blood Culture adequate volume   Culture   Final    NO GROWTH 2 DAYS Performed at [facility], [address] [place], [place] [address]    Report Status PENDING  Incomplete  Urine culture     Status: Abnormal (Preliminary result)   Collection Time: 07/02/17  9:18 PM  Result Value Ref Range Status   Specimen Description URINE, RANDOM  Final   Special Requests NONE  Final   Culture >=100,000 COLONIES/mL KLEBSIELLA PNEUMONIAE (A)  Final   Report Status PENDING  Incomplete      Radiology Studies: Dg Abd 1 View  Result Date: 07/04/2017 CLINICAL DATA:  Left ureteral calculus EXAM: ABDOMEN - 1 VIEW COMPARISON:  CT abdomen pelvis 06/12/2017 FINDINGS: Bilateral ureteral stents in good position extending into the bladder. Bilateral nephrostomy tubes in good position overlying the proximal stent bilaterally. No ureteral calculus identified. Note is made of small renal calculi on the prior CT Vertebroplasty at L1.  Constipation.  Normal bowel gas pattern. IMPRESSION: Bilateral ureteral stents in bilateral nephrostomies in good position. No urinary tract calculi identified. Constipation Electronically Signed   By: Franchot Gallo M.D.   On: 07/04/2017 09:29   Dg Chest Portable 1 View  Result Date: 07/02/2017 CLINICAL DATA:  Weakness and shortness of breath. EXAM: PORTABLE CHEST 1 VIEW COMPARISON:  06/12/2017 chest CT FINDINGS: Interstitial coarsening attributed to interstitial lung disease with fibrosis seen on prior CT. No acute superimposed opacity. No effusion or pneumothorax. Normal heart size. IMPRESSION: 1. Mild pulmonary fibrosis. 2. No acute superimposed finding. Electronically Signed   By: Monte Fantasia M.D.   On: 07/02/2017 18:10     Scheduled Meds: . acetaminophen  650 mg Oral TID  . aspirin EC  81 mg Oral Daily  . atorvastatin  10 mg Oral QPM    . cefTRIAXone (ROCEPHIN) IM  1 g Intramuscular Q24H  . enoxaparin (LOVENOX) injection  40 mg Subcutaneous QHS  . fluticasone  1 spray Each Nare Daily  . insulin aspart  0-9 Units Subcutaneous TID WC  . insulin glargine  20 Units Subcutaneous QHS  . lidocaine  1 patch Transdermal QHS  . LORazepam  0.5 mg Oral BID  . magnesium oxide  400 mg Oral BID  . polyethylene glycol  17 g Oral BID  . potassium chloride  40 mEq Oral Q4H  . senna  2 tablet Oral QHS  . sertraline  50 mg Oral BID  . tamsulosin  0.4 mg Oral QPM  . traZODone  50 mg Oral QHS  . zinc oxide  1 application Topical 6 X Daily   Continuous Infusions:    LOS: 2 days    Time spent: 30 min    Janece Canterbury, MD [facility] Pager [phone]  If 7PM-7AM, please contact night-coverage [email or web address] Password [facility] 07/04/2017, 4:28 PM

## 2017-07-04 NOTE — Progress Notes (Addendum)
Please call [facility] on call if pt is discharged over weekend for transportation, [facility]. [phone] (on call Nurse).

## 2017-07-04 NOTE — Progress Notes (Signed)
Pharmacy: ceftriaxone  Patient loss IV access.  UCX from 1/9 came back this morning with >100K klensiella pneumoniae.  Per Dr. Malachi BondsShort, change ceftriaxone to IM administration for UTI indication.  Plan: - ceftriaxone 1gm IM q24h - pharmacy will sign off.  Re-consult us if need further assistance.  Dorna LeitzAnh Jaisa Defino, PharmD, BCPS 07/04/2017 1:14 PM

## 2017-07-04 NOTE — Consult Note (Signed)
Urology Consult  CC: Referring physician: Renae Fickle, MD Reason for referral: Bilateral nephrostomy tube assessment.  Impression/Assessment:  1.  History of bilateral ureteral stones: He has bilateral nephrostomy tubes as well as bilateral double-J stents in currently.  His nephrostomy tubes were opened and a Foley catheter was inserted last night and he has good urine output from the Foley and is also having a moderate amount of output from his left nephrostomy tube but no significant output whatsoever from the tube on the right-hand side.  I therefore obtained a KUB which reveals both double-J stents are in good position and both nephrostomy tubes are also positioned correctly.  His previous nephrostogram revealed the passage of his right ureteral stone with no evidence of obstruction and therefore his right nephrostomy tube can actually be removed at this time since there is no obstruction, he has a double-J stent in place and it is having minimal output.  His left nephrostomy tube is having output but he has an adequately placed double-J stent and I cannot definitely see a stone in the area of the ureterovesical junction on the left-hand side although it may be faint and difficult to visualize. I have discussed with the patient the fact that he may have passed his stone on the left-hand side as well.  I will await the radiologist opinion regarding this as well but it may be reasonable, once all infection has been treated, to remove his left nephrostomy tube as well.   Plan:  1.  His right nephrostomy tube can be removed. 2.  Continue left nephrostomy tube to drainage for at least another 24 hours. 3.  I will determined the need for ureteroscopy and stone extraction on the left-hand side. 4.  He was scheduled to follow-up with me in the office on 07/07/17 however that will need to be rescheduled for him.   History of Present Illness: Alan [name] is a 72 year old male who has been  admitted for fever.  He has been on intravenous antibiotics via a PICC line.  He just completed his antibiotics this past Sunday. He has a past urologic history of being admitted with bilateral ureteral obstruction secondary to ureteral calculi and infection at that time.  He had bilateral nephrostomy tubes placed and then on 06/20/17 underwent placement of bilateral double-J stents and at the time had antegrade nephrostograms performed.  The study revealed the stones in his right ureter had passed and a stone remained present at the ureterovesical junction on the left-hand side.  He has had his nephrostomy tubes clamped as an outpatient.  He had been complaining yesterday of some upper abdominal discomfort but not flank pain.  He has been urinating.  Past Medical History:  Diagnosis Date  . Anemia   . Arthritis   . Dementia   . Diabetes mellitus without complication (HCC)   . ETOH abuse   . Myocardial infarction (HCC)   . Peripheral arterial disease (HCC)   . Stroke (HCC)   . Wears dentures   . Wears glasses    Past Surgical History:  Procedure Laterality Date  . ABDOMINAL AORTOGRAM N/A 10/25/2016   Procedure: Abdominal Aortogram;  Surgeon: Sherren Kerns, MD;  Location: [facility] INVASIVE CV LAB;  Service: Cardiovascular;  Laterality: N/A;  . BRAIN SURGERY    . CORONARY STENT PLACEMENT     " about 25 years ago (10/29/16)  . ENDARTERECTOMY FEMORAL Right 10/30/2016   Procedure: ENDARTERECTOMY FEMORAL-RIGHT WITH PATCH GRAFT;  Surgeon: Fabienne Bruns  E, MD;  Location: [facility] OR;  Service: Vascular;  Laterality: Right;  . IR NEPHROSTOMY EXCHANGE LEFT  06/20/2017  . IR NEPHROSTOMY EXCHANGE RIGHT  06/20/2017  . IR NEPHROSTOMY PLACEMENT LEFT  06/12/2017  . IR NEPHROSTOMY PLACEMENT RIGHT  06/12/2017  . IR URETERAL STENT PLACEMENT EXISTING ACCESS LEFT  06/20/2017  . IR URETERAL STENT PLACEMENT EXISTING ACCESS RIGHT  06/20/2017  . LOWER EXTREMITY ANGIOGRAPHY Right 10/25/2016   Procedure: Lower Extremity  Angiography;  Surgeon: Sherren KernsFields, Charles E, MD;  [facility];  Service: Cardiovascular;  Laterality: Right;    Medications:  Scheduled: . acetaminophen  650 mg Oral TID  . aspirin EC  81 mg Oral Daily  . atorvastatin  10 mg Oral QPM  . enoxaparin (LOVENOX) injection  40 mg Subcutaneous QHS  . fluticasone  1 spray Each Nare Daily  . insulin aspart  0-9 Units Subcutaneous TID WC  . insulin glargine  20 Units Subcutaneous QHS  . lidocaine  1 patch Transdermal QHS  . LORazepam  0.5 mg Oral BID  . sertraline  50 mg Oral BID  . tamsulosin  0.4 mg Oral QPM  . traZODone  50 mg Oral QHS  . zinc oxide  1 application Topical 6 X Daily   Continuous: . anidulafungin Stopped (07/03/17 2018)  . vancomycin Stopped (07/04/17 0032)    Allergies:  Allergies  Allergen Reactions  . Penicillins Rash    Has patient had a PCN reaction causing immediate rash, facial/tongue/throat swelling, SOB or lightheadedness with hypotension: Yes Has patient had a PCN reaction causing severe rash involving mucus membranes or skin necrosis: No Has patient had a PCN reaction that required hospitalization No Has patient had a PCN reaction occurring within the last 10 years: No If all of the above answers are "NO", then may proceed with Cephalosporin use.     Family History  Problem Relation Age of Onset  . Diabetes Mother   . Heart disease Father   . Heart disease Brother     Social History:  reports that he has quit smoking. His smoking use included cigarettes. he has never used smokeless tobacco. He reports that he does not drink alcohol or use drugs.  Review of Systems (10 point): Pertinent items are noted in HPI. A comprehensive review of systems was negative except as noted above.  Physical Exam:  Vital signs in last 24 hours: Temp:  [98.9 F (37.2 C)-101.5 F (38.6 C)] 99 F (37.2 C) (01/11 0636) Pulse Rate:  [92-98] 98 (01/11 0636) Resp:  [18] 18 (01/11 0636) BP:  (106-134)/(65-82) 106/65 (01/11 0636) SpO2:  [96 %-100 %] 98 % (01/11 0636) Weight:  [75.8 kg (167 lb 3.2 oz)] 75.8 kg (167 lb 3.2 oz) (01/11 0636) General appearance: alert and appears stated age Head: Normocephalic, without obvious abnormality, atraumatic Eyes: conjunctivae/corneas clear. EOM's intact.  Oropharynx: moist mucous membranes Neck: supple, symmetrical, trachea midline Resp: normal respiratory effort Cardio: regular rate and rhythm Back: symmetric, no curvature. ROM normal. No CVA tenderness. GI: soft, non-tender; bowel sounds normal; no masses,  no organomegaly Male genitalia: penis: normal male phallus with no lesions or discharge. Foley catheter in place draining clear urine.  Testes: bilaterally descended with no masses or tenderness. no hernias Skin: Skin color normal. No visible rashes or lesions Neurologic: Grossly normal  Laboratory Data:  Recent Labs    07/02/17 1732 07/03/17 0427 07/04/17 0806  WBC 6.3 6.2 8.8  HGB 10.1* 9.5* 8.5*  HCT 30.9* 29.5* 25.2*  BMET Recent Labs    07/03/17 0427 07/04/17 0806  NA 136 134*  K 3.1* 2.8*  CL 101 97*  CO2 27 31  GLUCOSE 119* 139*  BUN 10 10  CREATININE 0.68 0.71  CALCIUM 7.8* 7.8*   No results for input(s): LABPT, INR in the last 72 hours. No results for input(s): LABURIN in the last 72 hours. Results for orders placed or performed during the hospital encounter of 07/02/17  Culture, blood (routine x 2)     Status: None (Preliminary result)   Collection Time: 07/02/17  6:00 PM  Result Value Ref Range Status   Specimen Description BLOOD RIGHT HAND  Final   Special Requests   Final    BOTTLES DRAWN AEROBIC AND ANAEROBIC Blood Culture adequate volume   Culture   Final    NO GROWTH < 24 HOURS Performed at [facility], [address] [place], [place] [address]    Report Status PENDING  Incomplete  Culture, blood (routine x 2)     Status: None (Preliminary result)   Collection Time: 07/02/17  6:30  PM  Result Value Ref Range Status   Specimen Description BLOOD RIGHT ARM  Final   Special Requests   Final    BOTTLES DRAWN AEROBIC AND ANAEROBIC Blood Culture adequate volume   Culture   Final    NO GROWTH < 24 HOURS Performed at [facility], [address], [place], [place] [address]    Report Status PENDING  Incomplete   Creatinine: Recent Labs    07/02/17 1732 07/03/17 0427 07/04/17 0806  CREATININE 0.93 0.68 0.71    Imaging: Dg Chest Portable 1 View  Result Date: 07/02/2017 CLINICAL DATA:  Weakness and shortness of breath. EXAM: PORTABLE CHEST 1 VIEW COMPARISON:  06/12/2017 chest CT FINDINGS: Interstitial coarsening attributed to interstitial lung disease with fibrosis seen on prior CT. No acute superimposed opacity. No effusion or pneumothorax. Normal heart size. IMPRESSION: 1. Mild pulmonary fibrosis. 2. No acute superimposed finding. Electronically Signed   By: Marnee Spring M.D.   On: 07/02/2017 18:10    KUB images were independently reviewed.   Birdia Jaycox C 07/04/2017, 8:44 AM

## 2017-07-05 DIAGNOSIS — T83512A Infection and inflammatory reaction due to nephrostomy catheter, initial encounter: Principal | ICD-10-CM

## 2017-07-05 LAB — URINE CULTURE: Culture: >=100000 — AB

## 2017-07-05 LAB — GLUCOSE, CAPILLARY
GLUCOSE-CAPILLARY: 290 mg/dL — AB (ref 65–99)
GLUCOSE-CAPILLARY: 269 mg/dL — AB (ref 65–99)
Glucose-Capillary: 273 mg/dL — ABNORMAL HIGH (ref 65–99)
Glucose-Capillary: 300 mg/dL — ABNORMAL HIGH (ref 65–99)

## 2017-07-05 LAB — BASIC METABOLIC PANEL
ANION GAP: 8 (ref 5–15)
BUN: 13 mg/dL (ref 6–20)
CHLORIDE: 97 mmol/L — AB (ref 101–111)
CO2: 29 mmol/L (ref 22–32)
Calcium: 7.8 mg/dL — ABNORMAL LOW (ref 8.9–10.3)
Creatinine, Ser: 0.86 mg/dL (ref 0.61–1.24)
GFR calc Af Amer: >60 mL/min (ref >60)
GFR calc non Af Amer: >60 mL/min (ref >60)
Glucose, Bld: 343 mg/dL — ABNORMAL HIGH (ref 65–99)
POTASSIUM: 3.6 mmol/L (ref 3.5–5.1)
SODIUM: 134 mmol/L — AB (ref 135–145)

## 2017-07-05 MED ORDER — INSULIN GLARGINE 100 UNIT/ML ~~LOC~~ SOLN
35.0000 [IU] | Freq: Every day | SUBCUTANEOUS | Status: DC
  Administered 2017-07-05: 35 [IU] via SUBCUTANEOUS
  Filled 2017-07-05 (×2): qty 0.35

## 2017-07-05 MED ORDER — INSULIN ASPART 100 UNIT/ML ~~LOC~~ SOLN
4.0000 [IU] | Freq: Three times a day (TID) | SUBCUTANEOUS | Status: DC
  Administered 2017-07-05 (×2): 4 [IU] via SUBCUTANEOUS

## 2017-07-05 MED ORDER — ERTAPENEM SODIUM 1 G IJ SOLR
1.0000 g | INTRAMUSCULAR | Status: DC
  Administered 2017-07-05: 1 g via INTRAMUSCULAR
  Filled 2017-07-05: qty 1

## 2017-07-05 MED ORDER — INSULIN ASPART 100 UNIT/ML ~~LOC~~ SOLN
8.0000 [IU] | Freq: Three times a day (TID) | SUBCUTANEOUS | Status: DC
  Administered 2017-07-05 – 2017-07-06 (×4): 8 [IU] via SUBCUTANEOUS

## 2017-07-05 MED ORDER — INSULIN ASPART 100 UNIT/ML ~~LOC~~ SOLN
0.0000 [IU] | Freq: Three times a day (TID) | SUBCUTANEOUS | Status: DC
  Administered 2017-07-05: 8 [IU] via SUBCUTANEOUS
  Administered 2017-07-06: 5 [IU] via SUBCUTANEOUS
  Administered 2017-07-06: 3 [IU] via SUBCUTANEOUS
  Administered 2017-07-06: 8 [IU] via SUBCUTANEOUS
  Administered 2017-07-07: 5 [IU] via SUBCUTANEOUS

## 2017-07-05 MED ORDER — INSULIN ASPART 100 UNIT/ML ~~LOC~~ SOLN
0.0000 [IU] | Freq: Every day | SUBCUTANEOUS | Status: DC
  Administered 2017-07-05: 3 [IU] via SUBCUTANEOUS
  Administered 2017-07-06: 2 [IU] via SUBCUTANEOUS

## 2017-07-05 MED ORDER — SODIUM CHLORIDE 0.9% FLUSH: 10.0000 mL | INTRAVENOUS | Status: DC | PRN

## 2017-07-05 NOTE — Consult Note (Signed)
Urology Progress Note      Subjective: Patient doing well this AM. Right nephrostomy tube removed yesterday 1/11. Patient with borderline fever overnight, 100.5. VSS. Significant output from left nephrostomy tube, minimal from foley comparatively. Urine clear yellow. Urine culture from 1/8 growing Klebsiella with multiple resistance. Urine culture from nephrostomy tubes growing gram negative rods, speciation to follow. Not currently on antibiotics.   Bladder scan 0cc.   Objective: Vital signs in last 24 hours: Temp:  [98.9 F (37.2 C)-100.5 F (38.1 C)] 99.2 F (37.3 C) (01/12 0606) Pulse Rate:  [78-91] 78 (01/12 0606) Resp:  [18-20] 20 (01/12 0606) BP: (120-157)/(62-73) 157/73 (01/12 0606) SpO2:  [97 %-98 %] 97 % (01/12 0606) Weight:  [74.3 kg (163 lb 14.4 oz)] 74.3 kg (163 lb 14.4 oz) (01/12 0500)  Intake/Output from previous day: 01/11 0701 - 01/12 0700 In: 600 [P.O.:600] Out: 3390 [Urine:3390] Intake/Output this shift: Total I/O In: -  Out: 800 [Urine:800]  Physical Exam:  General: Alert and oriented CV: RRR Lungs: Clear Abdomen: Soft, non distended GU: Foley with clear yellow urine in tubing. Left nephrostomy tube with clear yellow urine in tubing as well.   Ext: NT, No erythema  Lab Results: Recent Labs    07/02/17 1732 07/03/17 0427 07/04/17 0806  HGB 10.1* 9.5* 8.5*  HCT 30.9* 29.5* 25.2*   BMET Recent Labs    07/04/17 0806 07/05/17 0436  NA 134* 134*  K 2.8* 3.6  CL 97* 97*  CO2 31 29  GLUCOSE 139* 343*  BUN 10 13  CREATININE 0.71 0.86  CALCIUM 7.8* 7.8*     Studies/Results: Dg Abd 1 View  Result Date: 07/04/2017 CLINICAL DATA:  Left ureteral calculus EXAM: ABDOMEN - 1 VIEW COMPARISON:  CT abdomen pelvis 06/12/2017 FINDINGS: Bilateral ureteral stents in good position extending into the bladder. Bilateral nephrostomy tubes in good position overlying the proximal stent bilaterally. No ureteral calculus identified. Note is made of small renal  calculi on the prior CT Vertebroplasty at L1.  Constipation.  Normal bowel gas pattern. IMPRESSION: Bilateral ureteral stents in bilateral nephrostomies in good position. No urinary tract calculi identified. Constipation Electronically Signed   By: Marlan Palau M.D.   On: 07/04/2017 09:29   Ir Nephro Tube Remov/fl  Result Date: 07/04/2017 INDICATION: History of hydronephrosis and obstructing ureter stones. Patient has bilateral percutaneous nephrostomy tubes and bilateral ureter stents. Plan for removal of the right nephrostomy tube. EXAM: REMOVAL OF RIGHT NEPHROSTOMY TUBE WITH FLUOROSCOPIC GUIDANCE COMPARISON:  None. MEDICATIONS: None ANESTHESIA/SEDATION: None CONTRAST:  5 mL Isovue-300-administered into the collecting system(s) FLUOROSCOPY TIME:  Fluoroscopy Time: 42 seconds, 10 mGy COMPLICATIONS: None immediate. PROCEDURE: Patient was placed prone on the interventional table. The right flank and existing catheter were prepped and draped in a sterile fashion. Maximal barrier sterile technique was utilized including caps, mask, sterile gowns, sterile gloves, sterile drape, hand hygiene and skin antiseptic. Contrast was injected through the existing nephrostomy tube. The nephrostomy tube was cut and removed using a Bentson wire. Catheter was removed under fluoroscopic guidance in order to avoid dislodging the ureter stent. The nephrostomy tube was successfully removed without complication. Bandage placed at the old puncture site. FINDINGS: Right nephrostomy tube within the right renal collecting system. Right ureter stent is adequately positioned in the renal pelvis and urinary bladder. Nephrostogram demonstrated a filling defect in the renal pelvis probably representing blood clot because this is larger than the previous ureter stone. Contrast rapidly fills the ureter stent and drains into the  urinary bladder. The ureter stent is widely patent. Nephrostomy tube was successfully removed without dislodging the  ureter stent. IMPRESSION: Patent right ureter stent. Successful removal of the right nephrostomy tube with fluoroscopic guidance. Filling defect in the right renal pelvis probably related to blood clot. Electronically Signed   By: Richarda OverlieAdam  Henn M.D.   On: 07/04/2017 16:35    Assessment/Plan:  72 y.o. male with history of urosepsis and bilateral ureteral stones s/p bilateral percutaneous nephrostomy tube placement and internalization with bilateral JJ ureteral stents. Readmitted 1/9 due to fevers and lethargy.  Urinary retention on admission with unclear amount emptied, foley catheter in place. He has had passage of right stone based on updated imaging, unclear if left has passed. Currently he has a foley catheter in place which is draining well and a left nephrostomy tube to drainage, right nephrostomy tube removed 1/11.    - [facility] recommend treating current infection guided by previous culture sensitivities until current nephrostomy tube cultures return  - will cap left nephrostomy tube today. If patient remain afebrile without flank pain today, will remove nephrostomy tube tomorrow  - bella donna suppositories PRN for bladder spasms  -  Dr. Vernie Ammonsttelin will determine the need for ureteroscopy and stone extraction on the left-hand side as an outpatient. - He was scheduled to follow-up with Dr. Vernie Ammonsttelin in the office on 07/07/17 however that will need to be rescheduled for him.    Urology will continue to follow    LOS: 3 days   FILIPPOU, PAULINE L 07/05/2017, 11:04 AM

## 2017-07-05 NOTE — Progress Notes (Signed)
Patient L nephro tube capped and patient bladder scanned per verbal order from Dr. Franciso BendFillipou.  Bladder scanner showed 0 ml in bladder.  300 ml clear yellow urine was in L nephro bag. Foley is draining clear yellow urine.  Will continue to monitor patient.

## 2017-07-05 NOTE — Progress Notes (Signed)
Peripherally Inserted Central Catheter/Midline Placement  The IV Nurse has discussed with the patient and/or persons authorized to consent for the patient, the purpose of this procedure and the potential benefits and risks involved with this procedure.  The benefits include less needle sticks, lab draws from the catheter, and the patient may be discharged home with the catheter. Risks include, but not limited to, infection, bleeding, blood clot (thrombus formation), and puncture of an artery; nerve damage and irregular heartbeat and possibility to perform a PICC exchange if needed/ordered by physician.  Alternatives to this procedure were also discussed.  Bard Power PICC patient education guide, fact sheet on infection prevention and patient information card has been provided to patient /or left at bedside.  Spoke with wife via telephone for consent.  No questions asked, states is familiar with PICC line since just went home with one.   PICC/Midline Placement Documentation  PICC Single Lumen 07/05/17 PICC Right Brachial 39 cm 1 cm (Active)  Indication for Insertion or Continuance of Line Poor Vasculature-patient has had multiple peripheral attempts or PIVs lasting less than 24 hours 07/05/2017  6:04 PM  Exposed Catheter (cm) 0 cm 07/05/2017  6:04 PM  Site Assessment Clean;Dry;Intact 07/05/2017  6:04 PM  Line Status Saline locked;Flushed;Blood return noted 07/05/2017  6:04 PM  Dressing Type Transparent 07/05/2017  6:04 PM  Dressing Status Clean;Dry;Intact;Antimicrobial disc in place 07/05/2017  6:04 PM  Line Care Connections checked and tightened 07/05/2017  6:04 PM  Line Adjustment (NICU/IV Team Only) No 07/05/2017  6:04 PM  Dressing Intervention New dressing 07/05/2017  6:04 PM  Dressing Change Due 07/12/17 07/05/2017  6:04 PM       Alan [name], Alan [name] 07/05/2017, 6:05 PM

## 2017-07-05 NOTE — Evaluation (Signed)
Physical Therapy Evaluation Patient Details Name: Alan SeedsGlenn A Kille Jr. 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  Work up findings includ bacteremia due to ascending obstructive pyelonephritis, s/p nephrostomy tubes 12/20.  Clinical Impression  Pt admitted as above and presenting with functional mobility limitations 2* generalized weakness, dementia related cognition, L BKA/R Transmet amp, and balance deficits.  Pt would benefit from follow up care in home environment as long as [facility] can continue to provide level of assist pt needs.    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  Restrictions Weight Bearing Restrictions: No      Mobility  Bed Mobility Overal bed mobility: Needs Assistance Bed Mobility: Supine to Sit;Sit to Supine Rolling: Min assist   Supine to sit: Min guard     General bed mobility comments: Increased time and multimodal cues required.   Transfers Overall transfer level: Needs assistance Equipment used: Rolling walker (2 wheeled) Transfers: Sit to/from Stand Sit to Stand: Mod assist;+2 safety/equipment         General transfer comment: Cues for safety and use of UEs to self assist.  Physical assist to bring wt up and fwd and to balance in standing with RW.  To chair deferred with pt requesting back to bed  Ambulation/Gait             General Gait Details: Pt stood x 2.   Step not attempted 2* R knee buckling and pt's impulsive nature  Stairs            Wheelchair Mobility    Modified Rankin (Stroke Patients Only)       Balance Overall balance assessment: Needs assistance Sitting-balance support: Feet supported;No upper extremity supported Sitting balance-Leahy Scale: Fair     Standing balance support: Bilateral upper extremity supported Standing balance-Leahy Scale: Zero                               Pertinent Vitals/Pain Pain Assessment: No/denies pain    Home Living Family/patient expects to be discharged to:: Unsure Living Arrangements: Spouse/significant other Available Help at Discharge: Family;Available 24 hours/day Type of Home: House Home Access: Ramped entrance     Home Layout: One level Home Equipment: Walker - 2 wheels;Bedside commode;Tub bench;Wheelchair - Careers advisermanual;Other (comment);Hospital bed Additional Comments: [facility] program participant    Prior Function Level of Independence: Needs assistance   Gait / Transfers Assistance Needed: Limited to transfers with assist of wife. Wife reports "on good days" pt able to perform slide board transfers with assist. On bad days; wife performs squat pivot transfer.  ADL's / Homemaking Assistance Needed: Wife assisting with all ADL; including self feeding at times.  Comments: Hx from previous charts.  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     Hand Dominance  Extremity/Trunk Assessment   Upper Extremity Assessment Upper Extremity Assessment: LUE deficits/detail LUE Deficits / Details: L shoulder weakness at baeline; general RUE weakness    Lower Extremity Assessment Lower Extremity Assessment: RLE deficits/detail;LLE deficits/detail RLE Deficits / Details: Forfoot amputation LLE Deficits / Details: BKA       Communication   Communication: No difficulties  Cognition Arousal/Alertness: Awake/alert Behavior  During Therapy: Flat affect Overall Cognitive Status: History of cognitive impairments - at baseline                         Following Commands: Follows one step commands inconsistently;Follows one step commands with increased time Safety/Judgement: Decreased awareness of safety;Decreased awareness of deficits   Problem Solving: Slow processing;Decreased initiation;Difficulty sequencing General Comments: Pt has difficulty maintaining focus on task which hinders progression with tx.        General Comments      Exercises     Assessment/Plan    PT Assessment Patient needs continued PT services  PT Problem List Decreased strength;Decreased activity tolerance;Decreased balance;Decreased mobility;Decreased coordination       PT Treatment Interventions Functional mobility training;Balance training;Neuromuscular re-education;Patient/family education;Therapeutic activities    PT Goals (Current goals can be found in the Care Plan section)  Acute Rehab PT Goals Patient Stated Goal: No goals states PT Goal Formulation: Patient unable to participate in goal setting Time For Goal Achievement: 07/19/17 Potential to Achieve Goals: Fair    Frequency Min 3X/week   Barriers to discharge        Co-evaluation               AM-PAC PT "6 Clicks" Daily Activity  Outcome Measure Difficulty turning over in bed (including adjusting bedclothes, sheets and blankets)?: A Little Difficulty moving from lying on back to sitting on the side of the bed? : A Lot Difficulty sitting down on and standing up from a chair with arms (e.g., wheelchair, bedside commode, etc,.)?: Unable Help needed moving to and from a bed to chair (including a wheelchair)?: A Lot Help needed walking in hospital room?: Total Help needed climbing 3-5 steps with a railing? : Total 6 Click Score: 10    End of Session Equipment Utilized During Treatment: Gait belt Activity Tolerance: Patient limited by  fatigue Patient left: in bed;with call bell/phone within reach;with bed alarm set Nurse Communication: Mobility status PT Visit Diagnosis: Muscle weakness (generalized) (M62.81);Other symptoms and signs involving the nervous system (R29.898);Other abnormalities of gait and mobility (R26.89)    Time: 1539-1600 PT Time Calculation (min) (ACUTE ONLY): 21 min   Charges:   PT Evaluation $PT Eval Moderate Complexity: 1 Mod     PT G Codes:        Pg [phone]   Alan [name] 07/05/2017, 4:52 PM

## 2017-07-05 NOTE — Progress Notes (Addendum)
PROGRESS NOTE  Alan [name].  GYF:[id number] DOB: 07/24/1945 DOA: 07/02/2017 PCP: Angelica Pou, MD  Brief Narrative:   The patient is a 72 year old male with history of diabetes mellitus, dementia, bilateral renal stones who was admitted in December with bilateral nephrolithiasis with obstruction and had bilateral nephrostomy tubes placed.  He was subsequently readmitted with sepsis and was found to have MSSA bacteremia and fungal anemia.  He was treated with IV antibiotics via PICC line which was just removed a couple days prior to this admission.  After cessation of his antibiotics, he developed high fevers, worsening confusion and lethargy and was brought back to the emergency department by his family.  He was febrile to 101.6, tachycardic, tachypneic.  His urinalysis demonstrated too numerous to count WBCs consistent with UTI.  ID was consulted and commended resuming vancomycin, anidulafungin, and they have added ceftriaxone for better gram-negative coverage pending urine culture results.  Blood cultures are pending.  Patient appears to be having some difficulty urinating and having a lot of suprapubic pressure.  We were unable to get a reading with bladder scan and placed a coud catheter which resulted in immediate relief of his discomfort but only removal of 300 mL of cloudy urine.  Case discussed with Dr. Karsten Ro who agreed with coude catheter but also asked that his nephrostomy tubes be placed back to drain and obtain a urine culture from each side.  Urine culture from bladder growing ESBL Klebsiella.  Start IM ertapenem while awaiting PICC line placement.     Assessment & Plan:  Sepsis 2/2 EBSL Klebsiella CAUTI, present at time of admission -  D/c ceftriaxone and start ertapenem IM -  Will change to ertapenem IV via PICC line once PICC line placed -  Nephrostomy cultures are still pending -  Blood cultures NGTD -  Appreciate ID and Urology assistance -  toradol prn  -   Right nephrostomy removed by radiology on 1/11. -  Left nephrostomy clamped on 1/12  Acute urinary retention -  Coude catheter placed > leave in until follow up with Urology as oupatient  Acute metabolic encephalopathy superimposed on dementia -  Improving -  Minimize sedating medications   DM type 2, CBG mildly elevated Increase to glargine to 35 units (normally on 44 at home) Increase SSI AC Increase standing aspart to 8 units  Hold metformin  Anxiety, stable Cont zoloft BID ativan  Skin breakdown Skin care meds  Hyperlipidemia Continue statin  BPH and ureteral stones Cont flomax  Insomnia Cont trazodone  Constipation on XR:  Resolved.  Hypokalemia, oral potassium 61mq q4h x 3 -  Check magnesium level:  1.3 -  Unable to give IV, lack of PIV.  Started magnesium oxide PO on 1/11 -  Repeat magnesium level and administer IV once PICC line placed  Code Status: FC DVT Prophylaxis: lovenox Family Communication:  patient alone Disposition Plan:  possible discharge tomorrow on ertapenem via PICC line.  Urology to determine whether second nephrostomy tube can be removed tomorrow   Consultants:   ID  Urology, Dr. OKarsten Ro Procedures:  none  Antimicrobials:  Anti-infectives (From admission, onward)   Start     Dose/Rate Route Frequency Ordered Stop   07/05/17 1200  ertapenem ([facility] injection 1 g     1 g Intramuscular Every 24 hours 07/05/17 1112     07/04/17 1400  cephALEXin (KEFLEX) capsule 500 mg  Status:  Discontinued     500 mg Oral 2 times  daily 07/04/17 1137 07/04/17 1227   07/04/17 1330  cefTRIAXone (ROCEPHIN) injection 1 g  Status:  Discontinued     1 g Intramuscular Every 24 hours 07/04/17 1312 07/05/17 1104   07/04/17 1230  cephALEXin (KEFLEX) capsule 500 mg  Status:  Discontinued     500 mg Oral 2 times daily 07/04/17 1227 07/04/17 1230   07/04/17 1000  cefTRIAXone (ROCEPHIN) 2 g in dextrose 5 % 50 mL IVPB  Status:  Discontinued     2  g 100 mL/hr over 30 Minutes Intravenous Every 24 hours 07/04/17 0929 07/04/17 1137   07/03/17 2000  vancomycin (VANCOCIN) 1,500 mg in sodium chloride 0.9 % 500 mL IVPB  Status:  Discontinued     1,500 mg 250 mL/hr over 120 Minutes Intravenous Every 24 hours 07/02/17 1926 07/04/17 1045   07/03/17 1800  anidulafungin (ERAXIS) 100 mg in sodium chloride 0.9 % 100 mL IVPB  Status:  Discontinued     100 mg 78 mL/hr over 100 Minutes Intravenous Every 24 hours 07/02/17 1809 07/02/17 1810   07/03/17 1800  anidulafungin (ERAXIS) 100 mg in sodium chloride 0.9 % 100 mL IVPB  Status:  Discontinued     100 mg 78 mL/hr over 100 Minutes Intravenous Every 24 hours 07/02/17 1810 07/04/17 1045   07/02/17 2315  anidulafungin (ERAXIS) IVPB  Status:  Discontinued    Comments:  Indication: candidemia Last Day of Therapy:  06/29/17 Labs - Once weekly:  CBC/D and BMP, Labs - Every other week:  ESR and CRP     100 mg Intravenous Every 24 hours 07/02/17 2309 07/02/17 2315   07/02/17 2315  vancomycin IVPB  Status:  Discontinued    Comments:  Indication:  bacteremia Last Day of Therapy:  06/29/17 Labs - 'Sunday/Monday:  CBC/D, BMP, and vancomycin trough. Labs - Thursday:  BMP and vancomycin trough Labs - Every other week:  ESR and CRP     1,250 mg Intravenous Every 24 hours 07/02/17 2309 07/02/17 2315   07/02/17 1815  anidulafungin (ERAXIS) 200 mg in sodium chloride 0.9 % 200 mL IVPB  Status:  Discontinued     200 mg 78 mL/hr over 200 Minutes Intravenous Every 24 hours 07/02/17 1809 07/02/17 1810   07/02/17 1815  anidulafungin (ERAXIS) 200 mg in sodium chloride 0.9 % 200 mL IVPB  Status:  Discontinued     200 mg 78 mL/hr over 200 Minutes Intravenous Every 24 hours 07/02/17 1810 07/02/17 1812   07/02/17 1815  anidulafungin (ERAXIS) 200 mg in sodium chloride 0.9 % 200 mL IVPB     20'$ 0 mg 78 mL/hr over 200 Minutes Intravenous  Once 07/02/17 1811 07/02/17 2321   07/02/17 1815  vancomycin (VANCOCIN) 1,500 mg in sodium  chloride 0.9 % 500 mL IVPB     1,500 mg 250 mL/hr over 120 Minutes Intravenous  Once 07/02/17 1813 07/02/17 2321       Subjective:  Still having some low grade fevers and some lower abdominal discomfort.  Denies SOB, chest pains, or back pains.    Objective: Vitals:   07/04/17 2116 07/05/17 0500 07/05/17 0606 07/05/17 1403  BP: 133/62  (!) 157/73 140/66  Pulse: 90  78 88  Resp: '20  20 20  '$ Temp: (!) 100.5 F (38.1 C)  99.2 F (37.3 C) 98.4 F (36.9 C)  TempSrc: Oral  Oral Oral  SpO2: 98%  97% 98%  Weight:  74.3 kg (163 lb 14.4 oz)    Height:  Intake/Output Summary (Last 24 hours) at 07/05/2017 1438 Last data filed at 07/05/2017 1405 Gross per 24 hour  Intake 600 ml  Output 4065 ml  Net -3465 ml   Filed Weights   07/03/17 0416 07/04/17 0636 07/05/17 0500  Weight: 73.9 kg (162 lb 14.7 oz) 75.8 kg (167 lb 3.2 oz) 74.3 kg (163 lb 14.4 oz)    Examination:  General exam:  Adult male.  No acute distress.  HEENT:  NCAT, MMM Respiratory system: Clear to auscultation bilaterally Cardiovascular system: Regular rate and rhythm, normal S1/S2. No murmurs, rubs, gallops or clicks.  Warm extremities Gastrointestinal system: Normal active bowel sounds, soft, nondistended, mildly TTP in the suprapubic area without rebound or guarding. MSK:  Normal tone and bulk, no lower extremity edema.  Left BKA. Neuro:  Left hemiparesis.    General exam:  Adult male.  No acute distress.  HEENT:  NCAT, MMM Respiratory system: Clear to auscultation bilaterally Cardiovascular system: Regular rate and rhythm, normal S1/S2. No murmurs, rubs, gallops or clicks.  Warm extremities Gastrointestinal system: Normal active bowel sounds, soft, nondistended, nontender. MSK:  Normal tone and bulk, left BKA Neuro:  Grossly intact   Data Reviewed: I have personally reviewed following labs and imaging studies  CBC: Recent Labs  Lab 07/02/17 1732 07/03/17 0427 07/04/17 0806  WBC 6.3 6.2 8.8    NEUTROABS 3.8  --   --   HGB 10.1* 9.5* 8.5*  HCT 30.9* 29.5* 25.2*  MCV 89.8 90.2 89.0  PLT 262 219 253   Basic Metabolic Panel: Recent Labs  Lab 07/02/17 1732 07/03/17 0427 07/04/17 0806 07/05/17 0436  NA 133* 136 134* 134*  K 3.1* 3.1* 2.8* 3.6  CL 90* 101 97* 97*  CO2 '31 27 31 29  '$ GLUCOSE 333* 119* 139* 343*  BUN '15 10 10 13  '$ CREATININE 0.93 0.68 0.71 0.86  CALCIUM 8.5* 7.8* 7.8* 7.8*  MG  --   --  1.3*  --    GFR: Estimated Creatinine Clearance: 85.2 mL/min (by C-G formula based on SCr of 0.86 mg/dL). Liver Function Tests: Recent Labs  Lab 07/02/17 1732  AST 24  ALT 17  ALKPHOS 89  BILITOT 0.4  PROT 7.3  ALBUMIN 3.0*   No results for input(s): LIPASE, AMYLASE in the last 168 hours. No results for input(s): AMMONIA in the last 168 hours. Coagulation Profile: No results for input(s): INR, PROTIME in the last 168 hours. Cardiac Enzymes: No results for input(s): CKTOTAL, CKMB, CKMBINDEX, TROPONINI in the last 168 hours. BNP (last 3 results) No results for input(s): PROBNP in the last 8760 hours. HbA1C: No results for input(s): HGBA1C in the last 72 hours. CBG: Recent Labs  Lab 07/04/17 1200 07/04/17 1646 07/04/17 2112 07/05/17 0817 07/05/17 1207  GLUCAP 155* 277* 311* 273* 300*   Lipid Profile: No results for input(s): CHOL, HDL, LDLCALC, TRIG, CHOLHDL, LDLDIRECT in the last 72 hours. Thyroid Function Tests: No results for input(s): TSH, T4TOTAL, FREET4, T3FREE, THYROIDAB in the last 72 hours. Anemia Panel: No results for input(s): VITAMINB12, FOLATE, FERRITIN, TIBC, IRON, RETICCTPCT in the last 72 hours. Urine analysis:    Component Value Date/Time   COLORURINE YELLOW 07/02/2017 2117   APPEARANCEUR CLOUDY (A) 07/02/2017 2117   LABSPEC 1.008 07/02/2017 2117   PHURINE 6.0 07/02/2017 2117   GLUCOSEU 150 (A) 07/02/2017 2117   HGBUR MODERATE (A) 07/02/2017 2117   BILIRUBINUR NEGATIVE 07/02/2017 2117   [place] NEGATIVE 07/02/2017 2117    PROTEINUR 30 (A) 07/02/2017 2117  NITRITE NEGATIVE 07/02/2017 2117   LEUKOCYTESUR LARGE (A) 07/02/2017 2117   Sepsis Labs: '@LABRCNTIP'$ (procalcitonin:4,lacticidven:4)  ) Recent Results (from the past 240 hour(s))  Culture, blood (routine x 2)     Status: None (Preliminary result)   Collection Time: 07/02/17  6:00 PM  Result Value Ref Range Status   Specimen Description BLOOD RIGHT HAND  Final   Special Requests   Final    BOTTLES DRAWN AEROBIC AND ANAEROBIC Blood Culture adequate volume   Culture   Final    NO GROWTH 3 DAYS Performed at [facility], [place] [address], [place], [place] [address]    Report Status PENDING  Incomplete  Culture, blood (routine x 2)     Status: None (Preliminary result)   Collection Time: 07/02/17  6:30 PM  Result Value Ref Range Status   Specimen Description BLOOD RIGHT ARM  Final   Special Requests   Final    BOTTLES DRAWN AEROBIC AND ANAEROBIC Blood Culture adequate volume   Culture   Final    NO GROWTH 3 DAYS Performed at [facility], [address], [place], [place] [address]    Report Status PENDING  Incomplete  Urine culture     Status: Abnormal   Collection Time: 07/02/17  9:18 PM  Result Value Ref Range Status   Specimen Description URINE, RANDOM  Final   Special Requests NONE  Final   Culture (A)  Final    >=100,000 COLONIES/mL KLEBSIELLA PNEUMONIAE Confirmed Extended Spectrum Beta-Lactamase Producer (ESBL).  In bloodstream infections from ESBL organisms, carbapenems are preferred over piperacillin/tazobactam. They are shown to have a lower risk of mortality.    Report Status 07/05/2017 FINAL  Final   Organism ID, Bacteria KLEBSIELLA PNEUMONIAE (A)  Final      Susceptibility   Klebsiella pneumoniae - MIC*    AMPICILLIN >=32 RESISTANT Resistant     CEFAZOLIN >=64 RESISTANT Resistant     CEFTRIAXONE >=64 RESISTANT Resistant     CIPROFLOXACIN 1 SENSITIVE Sensitive     GENTAMICIN >=16 RESISTANT Resistant     IMIPENEM <=0.25  SENSITIVE Sensitive     NITROFURANTOIN 64 INTERMEDIATE Intermediate     TRIMETH/SULFA >=320 RESISTANT Resistant     AMPICILLIN/SULBACTAM >=32 RESISTANT Resistant     PIP/TAZO 8 SENSITIVE Sensitive     Extended ESBL POSITIVE Resistant     ERTAPENEM Value in next row Sensitive      SENSITIVE<=0.5Performed at [facility] [address] [place], [place] [address]    * >=100,000 COLONIES/mL KLEBSIELLA PNEUMONIAE  Culture, Urine     Status: Abnormal (Preliminary result)   Collection Time: 07/04/17  1:46 AM  Result Value Ref Range Status   Specimen Description URINE, CATHETERIZED LEFT NEPHROSTOMY  Final   Special Requests NONE  Final   Culture (A)  Final    >=100,000 COLONIES/mL KLEBSIELLA PNEUMONIAE SUSCEPTIBILITIES TO FOLLOW Performed at [facility], [place] [address] [place], [place] [address]    Report Status PENDING  Incomplete  Culture, Urine     Status: Abnormal (Preliminary result)   Collection Time: 07/04/17  6:24 AM  Result Value Ref Range Status   Specimen Description URINE, CATHETERIZED RT NEPHROSTOMY  Final   Special Requests NONE  Final   Culture (A)  Final    >=100,000 COLONIES/mL KLEBSIELLA PNEUMONIAE SUSCEPTIBILITIES TO FOLLOW Performed at [facility], [place] [address], [place], [place] [address]    Report Status PENDING  Incomplete  Radiology Studies: Dg Abd 1 View  Result Date: 07/04/2017 CLINICAL DATA:  Left ureteral calculus EXAM: ABDOMEN - 1 VIEW COMPARISON:  CT abdomen pelvis 06/12/2017 FINDINGS: Bilateral ureteral stents in good position extending into the bladder. Bilateral nephrostomy tubes in good position overlying the proximal stent bilaterally. No ureteral calculus identified. Note is made of small renal calculi on the prior CT Vertebroplasty at L1.  Constipation.  Normal bowel gas pattern. IMPRESSION: Bilateral ureteral stents in bilateral nephrostomies in good position. No urinary tract calculi identified. Constipation  Electronically Signed   By: Franchot Gallo M.D.   On: 07/04/2017 09:29   Ir Nephro Tube Remov/fl  Result Date: 07/04/2017 INDICATION: History of hydronephrosis and obstructing ureter stones. Patient has bilateral percutaneous nephrostomy tubes and bilateral ureter stents. Plan for removal of the right nephrostomy tube. EXAM: REMOVAL OF RIGHT NEPHROSTOMY TUBE WITH FLUOROSCOPIC GUIDANCE COMPARISON:  None. MEDICATIONS: None ANESTHESIA/SEDATION: None CONTRAST:  5 mL Isovue-300-administered into the collecting system(s) FLUOROSCOPY TIME:  Fluoroscopy Time: 42 seconds, 10 mGy COMPLICATIONS: None immediate. PROCEDURE: Patient was placed prone on the interventional table. The right flank and existing catheter were prepped and draped in a sterile fashion. Maximal barrier sterile technique was utilized including caps, mask, sterile gowns, sterile gloves, sterile drape, hand hygiene and skin antiseptic. Contrast was injected through the existing nephrostomy tube. The nephrostomy tube was cut and removed using a Bentson wire. Catheter was removed under fluoroscopic guidance in order to avoid dislodging the ureter stent. The nephrostomy tube was successfully removed without complication. Bandage placed at the old puncture site. FINDINGS: Right nephrostomy tube within the right renal collecting system. Right ureter stent is adequately positioned in the renal pelvis and urinary bladder. Nephrostogram demonstrated a filling defect in the renal pelvis probably representing blood clot because this is larger than the previous ureter stone. Contrast rapidly fills the ureter stent and drains into the urinary bladder. The ureter stent is widely patent. Nephrostomy tube was successfully removed without dislodging the ureter stent. IMPRESSION: Patent right ureter stent. Successful removal of the right nephrostomy tube with fluoroscopic guidance. Filling defect in the right renal pelvis probably related to blood clot. Electronically  Signed   By: Markus Daft M.D.   On: 07/04/2017 16:35     Scheduled Meds: . acetaminophen  650 mg Oral TID  . aspirin EC  81 mg Oral Daily  . atorvastatin  10 mg Oral QPM  . enoxaparin (LOVENOX) injection  40 mg Subcutaneous QHS  . ertapenem  1 g Intramuscular Q24H  . fluticasone  1 spray Each Nare Daily  . insulin aspart  0-9 Units Subcutaneous TID WC  . insulin aspart  4 Units Subcutaneous TID WC  . insulin glargine  20 Units Subcutaneous QHS  . lidocaine  1 patch Transdermal QHS  . LORazepam  0.5 mg Oral BID  . magnesium oxide  400 mg Oral BID  . polyethylene glycol  17 g Oral BID  . senna  2 tablet Oral QHS  . sertraline  50 mg Oral BID  . tamsulosin  0.4 mg Oral QPM  . traZODone  50 mg Oral QHS  . zinc oxide  1 application Topical 6 X Daily   Continuous Infusions:    LOS: 3 days    Time spent: 30 min    Janece Canterbury, MD [facility] Pager [phone]  If 7PM-7AM, please contact night-coverage [email or web address] Password [facility] 07/05/2017, 2:38 PM

## 2017-07-06 LAB — URINE CULTURE: Culture: >=100000 — AB

## 2017-07-06 LAB — BASIC METABOLIC PANEL
Anion gap: 8 (ref 5–15)
BUN: 12 mg/dL (ref 6–20)
CO2: 34 mmol/L — ABNORMAL HIGH (ref 22–32)
CREATININE: 0.67 mg/dL (ref 0.61–1.24)
Calcium: 8.3 mg/dL — ABNORMAL LOW (ref 8.9–10.3)
Chloride: 98 mmol/L — ABNORMAL LOW (ref 101–111)
GFR calc non Af Amer: >60 mL/min (ref >60)
Glucose, Bld: 158 mg/dL — ABNORMAL HIGH (ref 65–99)
POTASSIUM: 3.1 mmol/L — AB (ref 3.5–5.1)
SODIUM: 140 mmol/L (ref 135–145)

## 2017-07-06 LAB — GLUCOSE, CAPILLARY
GLUCOSE-CAPILLARY: 168 mg/dL — AB (ref 65–99)
GLUCOSE-CAPILLARY: 261 mg/dL — AB (ref 65–99)
GLUCOSE-CAPILLARY: 218 mg/dL — AB (ref 65–99)
Glucose-Capillary: 243 mg/dL — ABNORMAL HIGH (ref 65–99)

## 2017-07-06 LAB — MAGNESIUM: MAGNESIUM: 1.8 mg/dL (ref 1.7–2.4)

## 2017-07-06 MED ORDER — INSULIN ASPART 100 UNIT/ML ~~LOC~~ SOLN
10.0000 [IU] | Freq: Three times a day (TID) | SUBCUTANEOUS | Status: DC
  Administered 2017-07-07: 10 [IU] via SUBCUTANEOUS

## 2017-07-06 MED ORDER — INSULIN GLARGINE 100 UNIT/ML ~~LOC~~ SOLN
40.0000 [IU] | Freq: Every day | SUBCUTANEOUS | Status: DC
  Administered 2017-07-06: 40 [IU] via SUBCUTANEOUS
  Filled 2017-07-06 (×2): qty 0.4

## 2017-07-06 MED ORDER — SODIUM CHLORIDE 0.9 % IV SOLN
1.0000 g | INTRAVENOUS | Status: DC
  Administered 2017-07-06 – 2017-07-07 (×2): 1 g via INTRAVENOUS
  Filled 2017-07-06 (×2): qty 1

## 2017-07-06 MED ORDER — POTASSIUM CHLORIDE CRYS ER 20 MEQ PO TBCR
40.0000 meq | EXTENDED_RELEASE_TABLET | Freq: Once | ORAL | Status: AC
  Administered 2017-07-06: 40 meq via ORAL
  Filled 2017-07-06: qty 2

## 2017-07-06 NOTE — Progress Notes (Signed)
PROGRESS NOTE  Alan [name].  KWI:[id number] DOB: 1946/05/26 DOA: 07/02/2017 PCP: Angelica Pou, MD  Brief Narrative:   The patient is a 72 year old male with history of diabetes mellitus, dementia, bilateral renal stones who was admitted in December with bilateral nephrolithiasis with obstruction and had bilateral nephrostomy tubes placed.  He was subsequently readmitted with sepsis and was found to have MSSA bacteremia and fungal anemia.  He was treated with IV antibiotics via PICC line which was just removed a couple days prior to this admission.  After cessation of his antibiotics, he developed high fevers, worsening confusion and lethargy and was brought back to the emergency department by his family.  He was febrile to 101.6, tachycardic, tachypneic.  His urinalysis demonstrated too numerous to count WBCs consistent with UTI.  ID was consulted and commended resuming vancomycin, anidulafungin, and they have added ceftriaxone for better gram-negative coverage pending urine culture results.  Blood cultures are pending.  Patient appears to be having some difficulty urinating and having a lot of suprapubic pressure.  We were unable to get a reading with bladder scan and placed a coud catheter which resulted in immediate relief of his discomfort but only removal of 300 mL of cloudy urine.  Case discussed with Dr. Karsten Ro who agreed with coude catheter but also asked that his nephrostomy tubes be placed back to drain and obtain a urine culture from each side.  Urine culture from bladder growing ESBL Klebsiella.  Started ertapenem.  PICC line placed.  IR to remove left nephrostomy tube tomorrow and then patient stable for discharge.   Assessment & Plan:  Sepsis 2/2 EBSL Klebsiella CAUTI, present at time of admission -  Continue ertapenem IV -  PICC line placed on 1/12 -  Nephrostomy cultures also growing Klebsiella -  Blood cultures NGTD -  Appreciate ID and Urology assistance -  toradol  prn  -  Right nephrostomy removed by radiology on 1/11. -  Left nephrostomy clamped on 1/12 and IR to remove on 1/13  Acute urinary retention -  Coude catheter placed > leave in until follow up with Urology as oupatient  Acute metabolic encephalopathy superimposed on dementia -  Improving -  Minimize sedating medications   DM type 2, CBG mildly elevated Increase to glargine to 40 units (normally on 44 at home) Increase SSI AC Increase standing aspart to 10 units  Hold metformin  Anxiety, stable Cont zoloft BID ativan  Skin breakdown Skin care meds  Hyperlipidemia Continue statin  BPH and ureteral stones Cont flomax  Insomnia Cont trazodone  Constipation on XR:  Resolved.  Hypokalemia, oral potassium ordered again today -  Magnesium level:  1.8, continue oral magnesium supplementation  Code Status: FC DVT Prophylaxis: lovenox Family Communication:  patient alone Disposition Plan:  discharge tomorrow on ertapenem via PICC line after nephrostomy tube removed.     Consultants:   ID  Urology, Dr. Karsten Ro  Procedures:  none  Antimicrobials:  Anti-infectives (From admission, onward)   Start     Dose/Rate Route Frequency Ordered Stop   07/06/17 1200  ertapenem (INVANZ) 1 g in sodium chloride 0.9 % 50 mL IVPB     1 g 100 mL/hr over 30 Minutes Intravenous Every 24 hours 07/06/17 0846     07/05/17 1200  ertapenem [facility]) injection 1 g  Status:  Discontinued     1 g Intramuscular Every 24 hours 07/05/17 1112 07/06/17 0846   07/04/17 1400  cephALEXin (KEFLEX) capsule 500 mg  Status:  Discontinued     500 mg Oral 2 times daily 07/04/17 1137 07/04/17 1227   07/04/17 1330  cefTRIAXone (ROCEPHIN) injection 1 g  Status:  Discontinued     1 g Intramuscular Every 24 hours 07/04/17 1312 07/05/17 1104   07/04/17 1230  cephALEXin (KEFLEX) capsule 500 mg  Status:  Discontinued     500 mg Oral 2 times daily 07/04/17 1227 07/04/17 1230   07/04/17 1000  cefTRIAXone  (ROCEPHIN) 2 g in dextrose 5 % 50 mL IVPB  Status:  Discontinued     2 g 100 mL/hr over 30 Minutes Intravenous Every 24 hours 07/04/17 0929 07/04/17 1137   07/03/17 2000  vancomycin (VANCOCIN) 1,500 mg in sodium chloride 0.9 % 500 mL IVPB  Status:  Discontinued     1,500 mg 250 mL/hr over 120 Minutes Intravenous Every 24 hours 07/02/17 1926 07/04/17 1045   07/03/17 1800  anidulafungin (ERAXIS) 100 mg in sodium chloride 0.9 % 100 mL IVPB  Status:  Discontinued     100 mg 78 mL/hr over 100 Minutes Intravenous Every 24 hours 07/02/17 1809 07/02/17 1810   07/03/17 1800  anidulafungin (ERAXIS) 100 mg in sodium chloride 0.9 % 100 mL IVPB  Status:  Discontinued     100 mg 78 mL/hr over 100 Minutes Intravenous Every 24 hours 07/02/17 1810 07/04/17 1045   07/02/17 2315  anidulafungin (ERAXIS) IVPB  Status:  Discontinued    Comments:  Indication: candidemia Last Day of Therapy:  06/29/17 Labs - Once weekly:  CBC/D and BMP, Labs - Every other week:  ESR and CRP     100 mg Intravenous Every 24 hours 07/02/17 2309 07/02/17 2315   07/02/17 2315  vancomycin IVPB  Status:  Discontinued    Comments:  Indication:  bacteremia Last Day of Therapy:  06/29/17 Labs - 'Sunday/Monday:  CBC/D, BMP, and vancomycin trough. Labs - Thursday:  BMP and vancomycin trough Labs - Every other week:  ESR and CRP     1,250 mg Intravenous Every 24 hours 07/02/17 2309 07/02/17 2315   07/02/17 1815  anidulafungin (ERAXIS) 200 mg in sodium chloride 0.9 % 200 mL IVPB  Status:  Discontinued     200 mg 78 mL/hr over 200 Minutes Intravenous Every 24 hours 07/02/17 1809 07/02/17 1810   07/02/17 1815  anidulafungin (ERAXIS) 200 mg in sodium chloride 0.9 % 200 mL IVPB  Status:  Discontinued     200 mg 78 mL/hr over 200 Minutes Intravenous Every 24 hours 07/02/17 1810 07/02/17 1812   07/02/17 1815  anidulafungin (ERAXIS) 200 mg in sodium chloride 0.9 % 200 mL IVPB     20'$ 0 mg 78 mL/hr over 200 Minutes Intravenous  Once 07/02/17 1811  07/02/17 2321   07/02/17 1815  vancomycin (VANCOCIN) 1,500 mg in sodium chloride 0.9 % 500 mL IVPB     1,500 mg 250 mL/hr over 120 Minutes Intravenous  Once 07/02/17 1813 07/02/17 2321       Subjective:  Feeling much better.  Denies abdominal pain, SOB, chest pains, flank pains, fevers or chills.    Objective: Vitals:   07/05/17 1403 07/05/17 2105 07/06/17 0500 07/06/17 0652  BP: 140/66 112/66  132/60  Pulse: 88 89  90  Resp: '20 18  19  '$ Temp: 98.4 F (36.9 C) 98.4 F (36.9 C)  98.4 F (36.9 C)  TempSrc: Oral Oral  Oral  SpO2: 98% 99%  100%  Weight:   74.3 kg (163 lb 12.8 oz)  Height:        Intake/Output Summary (Last 24 hours) at 07/06/2017 1556 Last data filed at 07/06/2017 4627 Gross per 24 hour  Intake 120 ml  Output 1650 ml  Net -1530 ml   Filed Weights   07/04/17 0636 07/05/17 0500 07/06/17 0500  Weight: 75.8 kg (167 lb 3.2 oz) 74.3 kg (163 lb 14.4 oz) 74.3 kg (163 lb 12.8 oz)    Examination:  General exam:  Adult male.  No acute distress.  HEENT:  NCAT, MMM Respiratory system: Clear to auscultation bilaterally Cardiovascular system: Regular rate and rhythm, normal S1/S2. No murmurs, rubs, gallops or clicks.  Warm extremities Gastrointestinal system: Normal active bowel sounds, soft, nondistended, nontender. MSK:  Normal tone and bulk, no lower extremity edema, left BKA Neuro:  Left hemiparesis   Data Reviewed: I have personally reviewed following labs and imaging studies  CBC: Recent Labs  Lab 07/02/17 1732 07/03/17 0427 07/04/17 0806  WBC 6.3 6.2 8.8  NEUTROABS 3.8  --   --   HGB 10.1* 9.5* 8.5*  HCT 30.9* 29.5* 25.2*  MCV 89.8 90.2 89.0  PLT 262 219 035   Basic Metabolic Panel: Recent Labs  Lab 07/02/17 1732 07/03/17 0427 07/04/17 0806 07/05/17 0436 07/06/17 0409  NA 133* 136 134* 134* 140  K 3.1* 3.1* 2.8* 3.6 3.1*  CL 90* 101 97* 97* 98*  CO2 '31 27 31 29 '$ 34*  GLUCOSE 333* 119* 139* 343* 158*  BUN '15 10 10 13 12  '$ CREATININE 0.93  0.68 0.71 0.86 0.67  CALCIUM 8.5* 7.8* 7.8* 7.8* 8.3*  MG  --   --  1.3*  --  1.8   GFR: Estimated Creatinine Clearance: 91.6 mL/min (by C-G formula based on SCr of 0.67 mg/dL). Liver Function Tests: Recent Labs  Lab 07/02/17 1732  AST 24  ALT 17  ALKPHOS 89  BILITOT 0.4  PROT 7.3  ALBUMIN 3.0*   No results for input(s): LIPASE, AMYLASE in the last 168 hours. No results for input(s): AMMONIA in the last 168 hours. Coagulation Profile: No results for input(s): INR, PROTIME in the last 168 hours. Cardiac Enzymes: No results for input(s): CKTOTAL, CKMB, CKMBINDEX, TROPONINI in the last 168 hours. BNP (last 3 results) No results for input(s): PROBNP in the last 8760 hours. HbA1C: No results for input(s): HGBA1C in the last 72 hours. CBG: Recent Labs  Lab 07/05/17 1207 07/05/17 1652 07/05/17 2114 07/06/17 0802 07/06/17 1239  GLUCAP 300* 290* 269* 168* 261*   Lipid Profile: No results for input(s): CHOL, HDL, LDLCALC, TRIG, CHOLHDL, LDLDIRECT in the last 72 hours. Thyroid Function Tests: No results for input(s): TSH, T4TOTAL, FREET4, T3FREE, THYROIDAB in the last 72 hours. Anemia Panel: No results for input(s): VITAMINB12, FOLATE, FERRITIN, TIBC, IRON, RETICCTPCT in the last 72 hours. Urine analysis:    Component Value Date/Time   COLORURINE YELLOW 07/02/2017 2117   APPEARANCEUR CLOUDY (A) 07/02/2017 2117   LABSPEC 1.008 07/02/2017 2117   PHURINE 6.0 07/02/2017 2117   GLUCOSEU 150 (A) 07/02/2017 2117   HGBUR MODERATE (A) 07/02/2017 2117   BILIRUBINUR NEGATIVE 07/02/2017 2117   [place] NEGATIVE 07/02/2017 2117   PROTEINUR 30 (A) 07/02/2017 2117   NITRITE NEGATIVE 07/02/2017 2117   LEUKOCYTESUR LARGE (A) 07/02/2017 2117   Sepsis Labs: '@LABRCNTIP'$ (procalcitonin:4,lacticidven:4)  ) Recent Results (from the past 240 hour(s))  Culture, blood (routine x 2)     Status: None (Preliminary result)   Collection Time: 07/02/17  6:00 PM  Result Value Ref Range  Status    Specimen Description BLOOD RIGHT HAND  Final   Special Requests   Final    BOTTLES DRAWN AEROBIC AND ANAEROBIC Blood Culture adequate volume   Culture   Final    NO GROWTH 4 DAYS Performed at [facility], [address] [place], [place] [address]    Report Status PENDING  Incomplete  Culture, blood (routine x 2)     Status: None (Preliminary result)   Collection Time: 07/02/17  6:30 PM  Result Value Ref Range Status   Specimen Description BLOOD RIGHT ARM  Final   Special Requests   Final    BOTTLES DRAWN AEROBIC AND ANAEROBIC Blood Culture adequate volume   Culture   Final    NO GROWTH 4 DAYS Performed at [facility], [place] [address], [place], [place] [address]    Report Status PENDING  Incomplete  Urine culture     Status: Abnormal   Collection Time: 07/02/17  9:18 PM  Result Value Ref Range Status   Specimen Description URINE, RANDOM  Final   Special Requests NONE  Final   Culture (A)  Final    >=100,000 COLONIES/mL KLEBSIELLA PNEUMONIAE Confirmed Extended Spectrum Beta-Lactamase Producer (ESBL).  In bloodstream infections from ESBL organisms, carbapenems are preferred over piperacillin/tazobactam. They are shown to have a lower risk of mortality.    Report Status 07/05/2017 FINAL  Final   Organism ID, Bacteria KLEBSIELLA PNEUMONIAE (A)  Final      Susceptibility   Klebsiella pneumoniae - MIC*    AMPICILLIN >=32 RESISTANT Resistant     CEFAZOLIN >=64 RESISTANT Resistant     CEFTRIAXONE >=64 RESISTANT Resistant     CIPROFLOXACIN 1 SENSITIVE Sensitive     GENTAMICIN >=16 RESISTANT Resistant     IMIPENEM <=0.25 SENSITIVE Sensitive     NITROFURANTOIN 64 INTERMEDIATE Intermediate     TRIMETH/SULFA >=320 RESISTANT Resistant     AMPICILLIN/SULBACTAM >=32 RESISTANT Resistant     PIP/TAZO 8 SENSITIVE Sensitive     Extended ESBL POSITIVE Resistant     ERTAPENEM Value in next row Sensitive      SENSITIVE<=0.5Performed at [facility] [address]  [place], [place] [address]    * >=100,000 COLONIES/mL KLEBSIELLA PNEUMONIAE  Culture, Urine     Status: Abnormal   Collection Time: 07/04/17  1:46 AM  Result Value Ref Range Status   Specimen Description URINE, CATHETERIZED LEFT NEPHROSTOMY  Final   Special Requests NONE  Final   Culture (A)  Final    >=100,000 COLONIES/mL KLEBSIELLA PNEUMONIAE Confirmed Extended Spectrum Beta-Lactamase Producer (ESBL).  In bloodstream infections from ESBL organisms, carbapenems are preferred over piperacillin/tazobactam. They are shown to have a lower risk of mortality. Performed at [facility], [place] [address], [place], [place] [address]    Report Status 07/06/2017 FINAL  Final   Organism ID, Bacteria KLEBSIELLA PNEUMONIAE (A)  Final      Susceptibility   Klebsiella pneumoniae - MIC*    AMPICILLIN >=32 RESISTANT Resistant     CEFAZOLIN >=64 RESISTANT Resistant     CEFTRIAXONE >=64 RESISTANT Resistant     CIPROFLOXACIN 1 SENSITIVE Sensitive     GENTAMICIN >=16 RESISTANT Resistant     IMIPENEM <=0.25 SENSITIVE Sensitive     NITROFURANTOIN 128 RESISTANT Resistant     TRIMETH/SULFA >=320 RESISTANT Resistant     AMPICILLIN/SULBACTAM >=32 RESISTANT Resistant     PIP/TAZO 32 INTERMEDIATE Intermediate     Extended ESBL POSITIVE Resistant     * >=  100,000 COLONIES/mL KLEBSIELLA PNEUMONIAE  Culture, Urine     Status: Abnormal   Collection Time: 07/04/17  6:24 AM  Result Value Ref Range Status   Specimen Description URINE, CATHETERIZED RT NEPHROSTOMY  Final   Special Requests NONE  Final   Culture (A)  Final    >=100,000 COLONIES/mL KLEBSIELLA PNEUMONIAE Confirmed Extended Spectrum Beta-Lactamase Producer (ESBL).  In bloodstream infections from ESBL organisms, carbapenems are preferred over piperacillin/tazobactam. They are shown to have a lower risk of mortality. Performed at [facility], [place] [address], [place], [place] [address]    Report Status 07/06/2017 FINAL  Final   Organism ID, Bacteria  KLEBSIELLA PNEUMONIAE (A)  Final      Susceptibility   Klebsiella pneumoniae - MIC*    AMPICILLIN >=32 RESISTANT Resistant     CEFAZOLIN >=64 RESISTANT Resistant     CEFTRIAXONE >=64 RESISTANT Resistant     CIPROFLOXACIN 1 SENSITIVE Sensitive     GENTAMICIN >=16 RESISTANT Resistant     IMIPENEM <=0.25 SENSITIVE Sensitive     NITROFURANTOIN 128 RESISTANT Resistant     TRIMETH/SULFA >=320 RESISTANT Resistant     AMPICILLIN/SULBACTAM >=32 RESISTANT Resistant     PIP/TAZO 8 SENSITIVE Sensitive     Extended ESBL POSITIVE Resistant     * >=100,000 COLONIES/mL KLEBSIELLA PNEUMONIAE      Radiology Studies: Ir Nephro Tube Remov/fl  Result Date: 07/04/2017 INDICATION: History of hydronephrosis and obstructing ureter stones. Patient has bilateral percutaneous nephrostomy tubes and bilateral ureter stents. Plan for removal of the right nephrostomy tube. EXAM: REMOVAL OF RIGHT NEPHROSTOMY TUBE WITH FLUOROSCOPIC GUIDANCE COMPARISON:  None. MEDICATIONS: None ANESTHESIA/SEDATION: None CONTRAST:  5 mL Isovue-300-administered into the collecting system(s) FLUOROSCOPY TIME:  Fluoroscopy Time: 42 seconds, 10 mGy COMPLICATIONS: None immediate. PROCEDURE: Patient was placed prone on the interventional table. The right flank and existing catheter were prepped and draped in a sterile fashion. Maximal barrier sterile technique was utilized including caps, mask, sterile gowns, sterile gloves, sterile drape, hand hygiene and skin antiseptic. Contrast was injected through the existing nephrostomy tube. The nephrostomy tube was cut and removed using a Bentson wire. Catheter was removed under fluoroscopic guidance in order to avoid dislodging the ureter stent. The nephrostomy tube was successfully removed without complication. Bandage placed at the old puncture site. FINDINGS: Right nephrostomy tube within the right renal collecting system. Right ureter stent is adequately positioned in the renal pelvis and urinary bladder.  Nephrostogram demonstrated a filling defect in the renal pelvis probably representing blood clot because this is larger than the previous ureter stone. Contrast rapidly fills the ureter stent and drains into the urinary bladder. The ureter stent is widely patent. Nephrostomy tube was successfully removed without dislodging the ureter stent. IMPRESSION: Patent right ureter stent. Successful removal of the right nephrostomy tube with fluoroscopic guidance. Filling defect in the right renal pelvis probably related to blood clot. Electronically Signed   By: Markus Daft M.D.   On: 07/04/2017 16:35     Scheduled Meds: . acetaminophen  650 mg Oral TID  . aspirin EC  81 mg Oral Daily  . atorvastatin  10 mg Oral QPM  . enoxaparin (LOVENOX) injection  40 mg Subcutaneous QHS  . fluticasone  1 spray Each Nare Daily  . insulin aspart  0-15 Units Subcutaneous TID WC  . insulin aspart  0-5 Units Subcutaneous QHS  . insulin aspart  8 Units Subcutaneous TID WC  . insulin glargine  35 Units Subcutaneous QHS  . lidocaine  1 patch Transdermal QHS  . LORazepam  0.5 mg Oral BID  . magnesium oxide  400 mg Oral BID  . polyethylene glycol  17 g Oral BID  . senna  2 tablet Oral QHS  . sertraline  50 mg Oral BID  . tamsulosin  0.4 mg Oral QPM  . traZODone  50 mg Oral QHS  . zinc oxide  1 application Topical 6 X Daily   Continuous Infusions: . ertapenem 1 g (07/06/17 1358)     LOS: 4 days    Time spent: 30 min    Janece Canterbury, MD [facility] Pager [phone]  If 7PM-7AM, please contact night-coverage [email or web address] Password [facility] 07/06/2017, 3:56 PM

## 2017-07-06 NOTE — Consult Note (Signed)
Urology Progress Note      Subjective: Patient doing well this AM. Right nephrostomy tube removed 1/11. Left nephrostomy tube capped yesterday AM. VSS, afebrile overnigh, fever curve seems to have defervesced. Foley catheter with 3L UOP, clear yellow. Patient without flank pain or nausea. Cr at baseline.   Urine culture from 1/8 growing Klebsiella with multiple resistance. Urine culture from nephrostomy tubes growing Klebsiella, sensitivities pending, sensitivities to follow. Started on ertapenem.   Objective: Vital signs in last 24 hours: Temp:  [98.4 F (36.9 C)] 98.4 F (36.9 C) (01/13 0652) Pulse Rate:  [88-90] 90 (01/13 0652) Resp:  [18-20] 19 (01/13 0652) BP: (112-140)/(60-66) 132/60 (01/13 0652) SpO2:  [98 %-100 %] 100 % (01/13 0652) Weight:  [74.3 kg (163 lb 12.8 oz)] 74.3 kg (163 lb 12.8 oz) (01/13 0500)  Intake/Output from previous day: 01/12 0701 - 01/13 0700 In: 360 [P.O.:360] Out: 2950 [Urine:2950] Intake/Output this shift: No intake/output data recorded.  Physical Exam:  General: Alert and oriented CV: RRR Lungs: Clear Abdomen: Soft, non distended GU: Foley with clear yellow urine in tubing. Left nephrostomy tube clamped. No hematoma, swelling, erythema around incision site.  Ext: NT, No erythema  Lab Results: Recent Labs    07/04/17 0806  HGB 8.5*  HCT 25.2*   BMET Recent Labs    07/05/17 0436 07/06/17 0409  NA 134* 140  K 3.6 3.1*  CL 97* 98*  CO2 29 34*  GLUCOSE 343* 158*  BUN 13 12  CREATININE 0.86 0.67  CALCIUM 7.8* 8.3*     Studies/Results: Ir Nephro Tube Remov/fl  Result Date: 07/04/2017 INDICATION: History of hydronephrosis and obstructing ureter stones. Patient has bilateral percutaneous nephrostomy tubes and bilateral ureter stents. Plan for removal of the right nephrostomy tube. EXAM: REMOVAL OF RIGHT NEPHROSTOMY TUBE WITH FLUOROSCOPIC GUIDANCE COMPARISON:  None. MEDICATIONS: None ANESTHESIA/SEDATION: None CONTRAST:  5 mL  Isovue-300-administered into the collecting system(s) FLUOROSCOPY TIME:  Fluoroscopy Time: 42 seconds, 10 mGy COMPLICATIONS: None immediate. PROCEDURE: Patient was placed prone on the interventional table. The right flank and existing catheter were prepped and draped in a sterile fashion. Maximal barrier sterile technique was utilized including caps, mask, sterile gowns, sterile gloves, sterile drape, hand hygiene and skin antiseptic. Contrast was injected through the existing nephrostomy tube. The nephrostomy tube was cut and removed using a Bentson wire. Catheter was removed under fluoroscopic guidance in order to avoid dislodging the ureter stent. The nephrostomy tube was successfully removed without complication. Bandage placed at the old puncture site. FINDINGS: Right nephrostomy tube within the right renal collecting system. Right ureter stent is adequately positioned in the renal pelvis and urinary bladder. Nephrostogram demonstrated a filling defect in the renal pelvis probably representing blood clot because this is larger than the previous ureter stone. Contrast rapidly fills the ureter stent and drains into the urinary bladder. The ureter stent is widely patent. Nephrostomy tube was successfully removed without dislodging the ureter stent. IMPRESSION: Patent right ureter stent. Successful removal of the right nephrostomy tube with fluoroscopic guidance. Filling defect in the right renal pelvis probably related to blood clot. Electronically Signed   By: Richarda OverlieAdam  Henn M.D.   On: 07/04/2017 16:35    Assessment/Plan:  72 y.o. male with history of urosepsis and bilateral ureteral stones s/p bilateral percutaneous nephrostomy tube placement and internalization with bilateral JJ ureteral stents. Readmitted 1/9 due to fevers and lethargy.  Urinary retention on admission with unclear amount emptied, foley catheter in place. He has had passage of right  stone based on updated imaging, unclear if left has passed.  Currently he has a foley catheter in place which is draining well and a left nephrostomy tube which has been capped for the last 24 hours, right nephrostomy tube removed 1/11.    - continue ertapenem per primary team, wean based on sensitivities of pending cultures  -  Remove nephrostomy tube today (order placed) - bella donna suppositories PRN for bladder spasms  -  Dr. Vernie Ammons will determine the need for ureteroscopy and stone extraction on the left-hand side as an outpatient. - He was scheduled to follow-up with Dr. Vernie Ammons in the office on 07/07/17 however that will need to be rescheduled for him.    LOS: 4 days   FILIPPOU, PAULINE L 07/06/2017, 9:00 AM

## 2017-07-06 NOTE — Plan of Care (Signed)
Will cont to mon 

## 2017-07-06 NOTE — Progress Notes (Signed)
PHARMACY CONSULT NOTE FOR:  OUTPATIENT  PARENTERAL ANTIBIOTIC THERAPY (OPAT)  Indication: ESBL (+) Klebsiella catheter-associated UTI Regimen: ertapenem 1 gram IV q24h to complete 14 days of therapy End date: last dose is due on 07/18/2017  IV antibiotic discharge orders are pended. To discharging provider:  please sign these orders via discharge navigator,  Select New Orders & click on the button choice - Manage This Unsigned Work.     Thank you for allowing pharmacy to be a part of this patient's care.  Alan [name], PharmD, BCPS Pager: [phone] 07/06/2017  8:55 AM

## 2017-07-07 ENCOUNTER — Inpatient Hospital Stay (HOSPITAL_COMMUNITY): Payer: Medicare (Managed Care)

## 2017-07-07 ENCOUNTER — Encounter (HOSPITAL_COMMUNITY): Payer: Self-pay | Admitting: Diagnostic Radiology

## 2017-07-07 DIAGNOSIS — N390 Urinary tract infection, site not specified: Secondary | ICD-10-CM

## 2017-07-07 DIAGNOSIS — Z936 Other artificial openings of urinary tract status: Secondary | ICD-10-CM

## 2017-07-07 DIAGNOSIS — E119 Type 2 diabetes mellitus without complications: Secondary | ICD-10-CM

## 2017-07-07 DIAGNOSIS — N201 Calculus of ureter: Secondary | ICD-10-CM

## 2017-07-07 DIAGNOSIS — G9341 Metabolic encephalopathy: Secondary | ICD-10-CM

## 2017-07-07 DIAGNOSIS — T83512A Infection and inflammatory reaction due to nephrostomy catheter, initial encounter: Secondary | ICD-10-CM

## 2017-07-07 DIAGNOSIS — B9689 Other specified bacterial agents as the cause of diseases classified elsewhere: Secondary | ICD-10-CM

## 2017-07-07 DIAGNOSIS — R338 Other retention of urine: Secondary | ICD-10-CM

## 2017-07-07 DIAGNOSIS — B961 Klebsiella pneumoniae [K. pneumoniae] as the cause of diseases classified elsewhere: Secondary | ICD-10-CM

## 2017-07-07 HISTORY — PX: IR NEPHRO TUBE REMOV/FL: IMG2342

## 2017-07-07 LAB — BASIC METABOLIC PANEL
Anion gap: 9 (ref 5–15)
BUN: 15 mg/dL (ref 6–20)
CHLORIDE: 99 mmol/L — AB (ref 101–111)
CO2: 32 mmol/L (ref 22–32)
Calcium: 8.8 mg/dL — ABNORMAL LOW (ref 8.9–10.3)
Creatinine, Ser: 0.67 mg/dL (ref 0.61–1.24)
GFR calc Af Amer: >60 mL/min (ref >60)
GFR calc non Af Amer: >60 mL/min (ref >60)
Glucose, Bld: 124 mg/dL — ABNORMAL HIGH (ref 65–99)
POTASSIUM: 3.7 mmol/L (ref 3.5–5.1)
SODIUM: 140 mmol/L (ref 135–145)

## 2017-07-07 LAB — GLUCOSE, CAPILLARY
GLUCOSE-CAPILLARY: 211 mg/dL — AB (ref 65–99)
Glucose-Capillary: 114 mg/dL — ABNORMAL HIGH (ref 65–99)

## 2017-07-07 LAB — CULTURE, BLOOD (ROUTINE X 2)
Culture: NO GROWTH
Culture: NO GROWTH
SPECIAL REQUESTS: ADEQUATE
SPECIAL REQUESTS: ADEQUATE

## 2017-07-07 LAB — MAGNESIUM: MAGNESIUM: 2.1 mg/dL (ref 1.7–2.4)

## 2017-07-07 MED ORDER — ERTAPENEM IV (FOR PTA / DISCHARGE USE ONLY): 1.0000 g | INTRAVENOUS | 0 refills | Status: AC

## 2017-07-07 MED ORDER — MAGNESIUM OXIDE 400 (241.3 MG) MG PO TABS: 400.0000 mg | ORAL_TABLET | Freq: Every day | ORAL | 0 refills | Status: DC

## 2017-07-07 MED ORDER — IOPAMIDOL (ISOVUE-300) INJECTION 61%
10.0000 mL | Freq: Once | INTRAVENOUS | Status: AC | PRN
  Administered 2017-07-07: 10 mL

## 2017-07-07 MED ORDER — HEPARIN SOD (PORK) LOCK FLUSH 100 UNIT/ML IV SOLN
250.0000 [IU] | INTRAVENOUS | Status: AC | PRN
  Administered 2017-07-07: 250 [IU]

## 2017-07-07 MED ORDER — IOPAMIDOL (ISOVUE-300) INJECTION 61%
INTRAVENOUS | Status: AC
  Filled 2017-07-07: qty 50

## 2017-07-07 MED ORDER — POLYETHYLENE GLYCOL 3350 17 G PO PACK: 17.0000 g | PACK | Freq: Every day | ORAL | 0 refills | Status: DC

## 2017-07-07 MED ORDER — ERTAPENEM IV (FOR PTA / DISCHARGE USE ONLY): 1.0000 g | INTRAVENOUS | 0 refills | Status: DC

## 2017-07-07 MED ORDER — LORAZEPAM 1 MG PO TABS: 0.5000 mg | ORAL_TABLET | Freq: Two times a day (BID) | ORAL | 0 refills | Status: DC

## 2017-07-07 NOTE — Clinical Social Work Placement (Signed)
Patient received and accepted bed offer at [facility].Facility aware of patient's discharge and confirmed patient's bed offer. [facility] contacted, patient's family notified. Patient's RN can call report to [phone], packet complete. CSW signing off, no other needs identified at this time.   CLINICAL SOCIAL WORK PLACEMENT  NOTE  Date:  07/07/2017  Patient Details  Name: Alan SeedsGlenn A Dematteo Jr. MRN: [id number] Date of Birth: 03-19-1946  Clinical Social Work is seeking post-discharge placement for this patient at the Skilled  Nursing Facility level of care (*CSW will initial, date and re-position this form in  chart as items are completed):  Yes   Patient/family provided with [place] Clinical Social Work Department's list of facilities offering this level of care within the geographic area requested by the patient (or if unable, by the patient's family).  Yes   Patient/family informed of their freedom to choose among providers that offer the needed level of care, that participate in Medicare, Medicaid or managed care program needed by the patient, have an available bed and are willing to accept the patient.  Yes   Patient/family informed of [place]'s ownership interest in [facility] and [facility], as well as of the fact that they are under no obligation to receive care at these facilities.  PASRR submitted to EDS on       PASRR number received on       Existing PASRR number confirmed on 07/07/17     FL2 transmitted to all facilities in geographic area requested by pt/family on 07/07/17     FL2 transmitted to all facilities within larger geographic area on       Patient informed that his/her managed care company has contracts with or will negotiate with certain facilities, including the following:        Yes   Patient/family informed of bed offers received.  Patient [facility]     Physician recommends and patient chooses bed at        [facility] on 07/07/17.  Patient to be transferred to facility by [facility]     Patient family notified on 07/07/17 of transfer.  Name of family member notified:  Arelia Sneddonarrie Dias     PHYSICIAN       Additional Comment:    _______________________________________________ Alan PolesKimberly L Laramie Gelles, LCSW 07/07/2017, 3:06 PM

## 2017-07-07 NOTE — Progress Notes (Signed)
[facility]  following pt during this hospitalization with ID team to support home IV ABX at DC as ordered.    If patient discharges after hours, please call [phone].   Sedalia Mutaamela S Chandler 07/07/2017, 12:23 PM

## 2017-07-07 NOTE — Procedures (Signed)
Left nephrostomy tube was capped this weekend and anticipated tube removal.  Tube injection confirmed patency of stent.  Nephrostomy was removed with fluoro guidance and using a Bentson wire.  Tube was removed without complication.  No blood loss.

## 2017-07-07 NOTE — Care Management Important Message (Addendum)
Important Message  Patient Details IM Letter given to Cookie/Case Manager to present to the Patient Name: Margaretha SeedsGlenn A Delmore Jr. MRN: [id number] Date of Birth: [birth year]   Medicare Important Message Given:  Yes    Caren MacadamFuller, Darenda Fike 07/07/2017, 11:56 AMImportant Message  Patient Details  Name: Margaretha SeedsGlenn A Kerper Jr. MRN: [id number] Date of Birth: [birth year]   Medicare Important Message Given:  Yes    Caren MacadamFuller, Elba Dendinger 07/07/2017, 11:56 AM

## 2017-07-07 NOTE — Progress Notes (Signed)
Physical Therapy Treatment Patient Details Name: Alan SeedsGlenn A Steeves Jr. MRN: [id number] DOB: 02-May-1946 Today's Date: 07/07/2017    History of Present Illness pt is a 72 y/o male with pmh significant for DM, CAD, dementia, CVA, PAD s/p L BKA and R transmet amputation and alcohol use d/o , presenting with sepsis.  Work up findings includ bacteremia due to ascending obstructive pyelonephritis, s/p nephrostomy tubes 12/20.    PT Comments    Patient progressing this session able to achieve OOB to chair and stood several attempts for hygiene due to bowel incontinence.  Patient will likely thrive more in his home environment with familiar set up and caregivers.  [facility] at [facility] recommended due to deconditioning from acute illness and multiple recent hospitalizations.    Follow Up Recommendations  Other (comment)([facility]     Equipment Recommendations  None recommended by PT    Recommendations for Other Services       Precautions / Restrictions Precautions Precautions: Fall Required Braces or Orthoses: Other Brace/Splint Other Brace/Splint: wrist brace L Restrictions Weight Bearing Restrictions: No    Mobility  Bed Mobility Overal bed mobility: Needs Assistance Bed Mobility: Supine to Sit     Supine to sit: Min assist;Mod assist     General bed mobility comments: increased time, max cues for follow through; min A initially, then returned to supine due to nausea, second attempt increased assist given  Transfers Overall transfer level: Needs assistance Equipment used: None;Rolling walker (2 wheeled) Transfers: Sit to/from [employer]Stand;Stand Pivot Transfers Sit to Stand: Max assist;+2 physical assistance Stand pivot transfers: Max assist       General transfer comment: first attempt with RW, but pt unable to get R LE extended, so two more trials due to pt soiled with BM and RN assisting with hygiene; stood with PT blocking knee and pt's arm over PT shoulder and tech assist on L  side, then foot sliding forward, third attempt with PT blocking knee, foot and pt's arm over PT shoulder, then pivot to chair on L with drop arm with max A.   Ambulation/Gait                 Stairs            Wheelchair Mobility    Modified Rankin (Stroke Patients Only)       Balance Overall balance assessment: Needs assistance Sitting-balance support: Feet supported;No upper extremity supported Sitting balance-Leahy Scale: Fair Sitting balance - Comments: able to sit with S about 2 minutes      Standing balance-Leahy Scale: Zero Standing balance comment: max A for standing for hygiene                            Cognition Arousal/Alertness: Awake/alert Behavior During Therapy: Flat affect Overall Cognitive Status: History of cognitive impairments - at baseline                     Current Attention Level: Sustained Memory: Decreased short-term memory Following Commands: Follows one step commands consistently;Follows multi-step commands with increased time Safety/Judgement: Decreased awareness of deficits   Problem Solving: Slow processing;Decreased initiation;Difficulty sequencing        Exercises General Exercises - Lower Extremity Ankle Circles/Pumps: AROM;Right;10 reps;Supine Heel Slides: AAROM;Both;10 reps;Supine    General Comments        Pertinent Vitals/Pain Faces Pain Scale: Hurts a little bit Pain Location: R foot (cold) Pain Intervention(s): Monitored  during session;Repositioned    Home Living                      Prior Function            PT Goals (current goals can now be found in the care plan section) Progress towards PT goals: Progressing toward goals    Frequency    Min 3X/week      PT Plan Current plan remains appropriate    Co-evaluation              AM-PAC PT "6 Clicks" Daily Activity  Outcome Measure  Difficulty turning over in bed (including adjusting bedclothes, sheets and  blankets)?: A Little Difficulty moving from lying on back to sitting on the side of the bed? : Unable Difficulty sitting down on and standing up from a chair with arms (e.g., wheelchair, bedside commode, etc,.)?: Unable Help needed moving to and from a bed to chair (including a wheelchair)?: A Lot Help needed walking in hospital room?: Total Help needed climbing 3-5 steps with a railing? : Total 6 Click Score: 9    End of Session Equipment Utilized During Treatment: Gait belt Activity Tolerance: Patient limited by fatigue Patient left: with chair alarm set;in chair;Other (comment);with call bell/phone within reach(MD in room)   PT Visit Diagnosis: Muscle weakness (generalized) (M62.81);Other symptoms and signs involving the nervous system (R29.898);Other abnormalities of gait and mobility (R26.89)     Time: 1610-9604 PT Time Calculation (min) (ACUTE ONLY): 24 min  Charges:  $Therapeutic Exercise: 8-22 mins $Therapeutic Activity: 8-22 mins                    G CodesSheran Lawless, [place] [phone] 07/07/2017    Elray Mcgregor 07/07/2017, 10:44 AM

## 2017-07-07 NOTE — Progress Notes (Signed)
[facility] with [facility], CSW notified by patient's MD that patient's family is interested in SNF. Patient is discharging with IV antibiotics.   CSW contacted [facility] and spoke with SW Irving BurtonEmily regarding SNF placement process. [facility] SW reported that their preference is for patient to dc home. CSW agreed to follow up with [facility] SW after speaking with patient's family.  CSW contacted patient's wife and discussed patient's discharge plans. CSW informed patient's wife that [facility] would prefer for patient to return home. Patient's wife reported that she is currently unable to care for patient in the home. CSW agreed to notify [facility].   [facility] and provided update. CSW requested that [facility] SW reach out to patient's wife and discuss discharge planning. [facility] SW agreed to contact patient's wife and call CSW with an update.  CSW contacted by [facility] SW and informed that patient will dc to SNF. [facility] SW reported that patient's information can be sent to their contracted SNFs ([facility] and [facility]).  CSW sent patient's information to contracted SNFs. [facility] and informed that they are able to accept patient.   CSW contacted patient's wife and provided update. [facility]. Patient's wife reported that she is unable to leave her home and [facility]. [facility]   CSW contacted [facility] SW and provided update.   CSW will coordinate transportation when patient is ready for dc. CSW will continue to follow and assist with discharge planning.  Celso SickleKimberly Darthula Desa, [place] Clinical Social Worker [facility] Cell#: [phone]

## 2017-07-07 NOTE — Discharge Summary (Signed)
Physician Discharge Summary  [name] ZOX:[id number] DOB: 27-Jun-1945 DOA: 07/02/2017  PCP: Miguel Aschoff, MD  Admit date: 07/02/2017 Discharge date: 07/07/2017  Admitted From: home/PACE program  Disposition:  PACE program  Recommendations for Outpatient Follow-up:  1. Follow up with PCP in 1-2 weeks 2. Please obtain BMP/CBC in one week 3. Please follow up on the following pending results:  Home Health:  HH RN  Equipment/Devices:  PICC line with IV antibiotics, coude catheter  Discharge Condition:  Stable, improved CODE STATUS:  DNR  Diet recommendation:  Healthy heart/carb modified, nectar thick liquids   Brief/Interim Summary:  The patient is a 72 year old male with history of diabetes mellitus, dementia, bilateral renal stones who was admitted in December with bilateral nephrolithiasis with obstruction and had bilateral nephrostomy tubes placed.  He was subsequently readmitted with sepsis and was found to have MSSA bacteremia and fungal anemia.  He was treated with IV antibiotics via PICC line which was just removed a couple days prior to this admission.  After cessation of his antibiotics, he developed high fevers, worsening confusion and lethargy and was brought back to the emergency department by his family.  He was febrile to 101.6, tachycardic, tachypneic.  His urinalysis demonstrated too numerous to count WBCs consistent with UTI.  ID was consulted and commended resuming vancomycin, anidulafungin, and they have added ceftriaxone for better gram-negative coverage pending urine culture results.  Blood cultures are pending.  Patient appears to be having some difficulty urinating and having a lot of suprapubic pressure.  We were unable to get a reading with bladder scan and placed a coud catheter which resulted in immediate relief of his discomfort but only removal of 300 mL of cloudy urine.  Case discussed with Dr. Vernie Ammons who agreed with coude catheter but also asked that  his nephrostomy tubes be placed back to drain and obtain a urine culture from each side.  Urine culture from bladder growing ESBL Klebsiella.  Started ertapenem.  PICC line placed.  IR to remove left nephrostomy tube tomorrow and then patient stable for discharge.    Discharge Diagnoses:  Principal Problem:   Sepsis (HCC) Active Problems:   Bilateral nephrolithiasis   Urinary tract infection due to ESBL Klebsiella   Urinary tract infection associated with nephrostomy catheter (HCC)   Acute urinary retention   Acute metabolic encephalopathy  Sepsis 2/2EBSL Klebsiella CAUTI, present at time of admission -  Continue ertapenem IV through 1/25 to complete a 2 week course of antibiotics -  PICC line placed on 1/12 -  Nephrostomy cultures also growing Klebsiella -  Blood cultures NGTD -  Appreciate ID and Urology assistance -  toradol prn  -  Right nephrostomy removed by on 1/11. -  Left nephrostomy removed on 1/13  Acute urinary retention due to BPH -  Coude catheter placed > leave in until follow up with Urology as oupatient -  Continue flomax  Acute metabolic encephalopathy superimposed on dementia, resolved with treatment of UTI  DM type 2, CBG mildly elevated, but improved with treatment of infection -  Resumed home insulin regimen with metformin  Anxiety, stable, cont zoloft and ativan  Skin breakdown, continue zinc oxide  Hyperlipidemia, stable, continue statin  Ureteral stones, stones have passed  Insomnia, stable, cont trazodone  Constipation on XR:  Resolved with stool softeners.  Prescribed daily miralax.  Hypokalemia and hypomagnesemia, resolved with oral potassium and magnesium supplementation  Discharge Instructions  Discharge Instructions    Call MD for:  difficulty breathing, headache or visual disturbances   Complete by:  As directed    Call MD for:  extreme fatigue   Complete by:  As directed    Call MD for:  hives   Complete by:  As  directed    Call MD for:  persistant dizziness or light-headedness   Complete by:  As directed    Call MD for:  persistant nausea and vomiting   Complete by:  As directed    Call MD for:  redness, tenderness, or signs of infection (pain, swelling, redness, odor or green/yellow discharge around incision site)   Complete by:  As directed    Call MD for:  severe uncontrolled pain   Complete by:  As directed    Call MD for:  temperature >100.4   Complete by:  As directed    Diet - low sodium heart healthy   Complete by:  As directed    Diet Carb Modified   Complete by:  As directed    Home infusion instructions [facility] May follow [facility] Dosing Protocol; May administer Cathflo as needed to maintain patency of vascular access device.; Flushing of vascular access device: per [facility] Protocol: 0.9% NaCl pre/post medica...   Complete by:  As directed    Instructions:  May follow [facility] Dosing Protocol   Instructions:  May administer Cathflo as needed to maintain patency of vascular access device.   Instructions:  Flushing of vascular access device: per [facility] Protocol: 0.9% NaCl pre/post medication administration and prn patency; Heparin 100 u/ml, 5ml for implanted ports and Heparin 10u/ml, 5ml for all other central venous catheters.   Instructions:  May follow [facility] Anaphylaxis Protocol for First Dose Administration in the home: 0.9% NaCl at 25-50 ml/hr to maintain IV access for protocol meds. Epinephrine 0.3 ml IV/IM PRN and Benadryl 25-50 IV/IM PRN s/s of anaphylaxis.   Instructions:  [facility] Infusion Coordinator (RN) to assist per patient IV care needs in the home PRN.   Increase activity slowly   Complete by:  As directed      Allergies as of 07/07/2017      Reactions   Penicillins Rash   Has patient had a PCN reaction causing immediate rash, facial/tongue/throat swelling, SOB or lightheadedness with hypotension: Yes Has patient had a PCN reaction causing severe  rash involving mucus membranes or skin necrosis: No Has patient had a PCN reaction that required hospitalization No Has patient had a PCN reaction occurring within the last 10 years: No If all of the above answers are "NO", then may proceed with Cephalosporin use.      Medication List    STOP taking these medications   anidulafungin IVPB Commonly known as:  ERAXIS   vancomycin IVPB     TAKE these medications   acetaminophen 650 MG CR tablet Commonly known as:  TYLENOL Take 650 mg by mouth 3 (three) times daily.   alendronate 70 MG tablet Commonly known as:  FOSAMAX Take 70 mg by mouth once a week. Take with a full glass of water on an empty stomach.   aspirin EC 81 MG tablet Take 81 mg by mouth daily.   atorvastatin 10 MG tablet Commonly known as:  LIPITOR Take 10 mg by mouth every evening.   ertapenem IVPB Commonly known as:  INVANZ Inject 1 g into the vein daily for 12 days. Indication:  ESBL (+) Klebsiella cathter-associated UTI Last Day of Therapy:  07/18/2017 Labs - Once  weekly:  CBC/D and BMP   eucerin cream Apply 1 application topically daily as needed for dry skin.   fluticasone 50 MCG/ACT nasal spray Commonly known as:  FLONASE Place 1 spray into the nose daily as needed for rhinitis.   insulin glargine 100 UNIT/ML injection Commonly known as:  LANTUS Inject 44 Units into the skin at bedtime.   lidocaine 5 % Commonly known as:  LIDODERM Place 1 patch onto the skin daily. Remove & Discard patch within 12 hours or as directed by MD   loperamide 2 MG capsule Commonly known as:  IMODIUM Take 2 capsules (4 mg total) by mouth every 6 (six) hours as needed for diarrhea or loose stools.   LORazepam 1 MG tablet Commonly known as:  ATIVAN Take 0.5 tablets (0.5 mg total) by mouth 2 (two) times daily.   Melatonin 10 MG Tabs Take 10 mg by mouth at bedtime.   metFORMIN 500 MG 24 hr tablet Commonly known as:  GLUCOPHAGE-XR Take 1,000 mg by mouth daily with  breakfast.   polyethylene glycol packet Commonly known as:  MIRALAX / GLYCOLAX Take 17 g by mouth daily.   sertraline 50 MG tablet Commonly known as:  ZOLOFT Take 50 mg by mouth 2 (two) times daily.   tamsulosin 0.4 MG Caps capsule Commonly known as:  FLOMAX Take 0.4 mg by mouth every evening.   traZODone 50 MG tablet Commonly known as:  DESYREL TAKE ONE TABLET (50 MG TOTAL) BY MOUTH AT BEDTIME.   zinc oxide 11.3 % Crea cream Commonly known as:  BALMEX Apply 1 application topically 6 (six) times daily.            Home Infusion Instuctions  (From admission, onward)        Start     Ordered   07/07/17 0000  Home infusion instructions [facility] May follow [facility] Dosing Protocol; May administer Cathflo as needed to maintain patency of vascular access device.; Flushing of vascular access device: per [facility] Protocol: 0.9% NaCl pre/post medica...    Question Answer Comment  Instructions May follow [facility] Dosing Protocol   Instructions May administer Cathflo as needed to maintain patency of vascular access device.   Instructions Flushing of vascular access device: per [facility] Protocol: 0.9% NaCl pre/post medication administration and prn patency; Heparin 100 u/ml, 5ml for implanted ports and Heparin 10u/ml, 5ml for all other central venous catheters.   Instructions May follow [facility] Anaphylaxis Protocol for First Dose Administration in the home: 0.9% NaCl at 25-50 ml/hr to maintain IV access for protocol meds. Epinephrine 0.3 ml IV/IM PRN and Benadryl 25-50 IV/IM PRN s/s of anaphylaxis.   Instructions [facility] Infusion Coordinator (RN) to assist per patient IV care needs in the home PRN.      07/07/17 1021     Follow-up Information    Ihor Gully, MD Follow up on 07/07/2017.   Specialty:  Urology Why:  For your appiontment at 2:45 Contact information: [address] [place] [address] [phone]        Miguel Aschoff, MD. Schedule an  appointment as soon as possible for a visit.   Specialty:  Internal Medicine Why:  as needed Contact information: [address] [place] [address] [phone]          Allergies  Allergen Reactions  . Penicillins Rash    Has patient had a PCN reaction causing immediate rash, facial/tongue/throat swelling, SOB or lightheadedness with hypotension: Yes Has  patient had a PCN reaction causing severe rash involving mucus membranes or skin necrosis: No Has patient had a PCN reaction that required hospitalization No Has patient had a PCN reaction occurring within the last 10 years: No If all of the above answers are "NO", then may proceed with Cephalosporin use.     Consultations: Urology Interventional radiology Infectious disease   Procedures/Studies: Ct Abdomen Pelvis Wo Contrast  Result Date: 06/12/2017 CLINICAL DATA:  72 year old male admitted yesterday with sepsis and altered mental status. EXAM: CT CHEST, ABDOMEN AND PELVIS WITHOUT CONTRAST TECHNIQUE: Multidetector CT imaging of the chest, abdomen and pelvis was performed following the standard protocol without IV contrast. COMPARISON:  Portable chest 06/11/2017. FINDINGS: CT CHEST FINDINGS Cardiovascular: Vascular patency is not evaluated in the absence of IV contrast. Calcified coronary artery and descending thoracic aortic atherosclerosis. Cardiac size at the upper limits of normal. No pericardial effusion. Mediastinum/Nodes: Negative noncontrast thoracic inlet and mediastinum. No lymphadenopathy. Small volume retained secretions in the thoracic esophagus which is nondilated. Lungs/Pleura: Major airways are patent. Respiratory motion artifact mostly in lower lobes. Upper lobe paraseptal emphysema. Posterior lower lobe and costophrenic angle subpleural opacity which could reflect a combination of atelectasis and chronic pulmonary interstitial changes such as mild honeycombing. No pleural effusion. No consolidation.  Musculoskeletal: Osteopenia. Mild chronic appearing T3 superior endplate compression. Chronic left posterior ninth rib fracture. No acute osseous abnormality identified. CT ABDOMEN PELVIS FINDINGS Hepatobiliary: Negative noncontrast liver and gallbladder. Pancreas: Negative. Spleen: Negative. Adrenals/Urinary Tract: Normal adrenal glands. Moderate to severe right and moderate left hydronephrosis. Associated bilateral extensive pararenal soft tissue stranding and mild expansion of the bilateral pararenal spaces. Thickening of the lateral Conal Foss show. Multiple punctate intrarenal calculi on the left. Questionable right midpole nephrolithiasis versus vascular calcification (coronal image 95). Associated bilateral hydroureter and periureteral stranding. The right ureter is abnormal to the level of a 5 x 8 mm calculus lodged just distal to the right ureteropelvic junction corresponding to the L3-L4 spinal level (series 3, image 87). Distal to this calculus there is persistent right periureteral stranding, but the right ureter is more decompressed. The right UVJ appears normal, however within the right posterior bladder there is an oval 3 x 6 mm calculus (series 3, image 121 and coronal image 98. The left ureter remains abnormal to the ureterovesical junction where an oval 5 x 7 mm calculus is demonstrated on coronal image 101. The urinary bladder is fairly decompressed. No perivesical stranding. Stomach/Bowel: Bulky stool ball in the rectum (sagittal image 102). Redundant sigmoid colon is mildly gas distended and tracks into the epigastrium. The descending colon is decompressed with some retained stool. Trace fluid in the left gutter appears probably related to the left renal space. Negative splenic flexure and transverse colon. Mildly redundant hepatic flexure. Decompressed ascending colon. Trace fluid in the right gutter appears probably related to the right renal space. The right colon is redundant and the cecum  is located on a lax mesentery in the right upper abdomen. Negative terminal ileum. Appendix is diminutive or absent. No dilated small bowel. Jejunum and ileum appear within normal limits. The duodenum appears mildly to moderately thickened and inflamed (coronal image 77), but this is felt to be secondary to the right renal findings. Negative stomach. No abdominal free air. Trace abdominal free fluid, primarily in both pericolic gutters. Vascular/Lymphatic: Aortoiliac calcified atherosclerosis. Vascular patency is not evaluated in the absence of IV contrast. Infrarenal abdominal aorta measures up to 25 mm diameter. No lymphadenopathy. Reproductive: Negative.  Other: No pelvic free fluid. Musculoskeletal: Previously augmented L1 compression fracture. Osteopenia. Advanced L5-S1 disc degeneration. No acute osseous abnormality identified. IMPRESSION: 1. Moderate to severe bilateral Obstructive Uropathy. - 5 x 8 mm proximal Right ureter calculus about 2 cm distal to the right renal pelvis. - 5 x 7 mm distal Left ureter calculus just proximal to the UVJ. - a separate 6 mm calculus within the right urinary bladder probably is a recently passed stone. 2. Moderate to severe bilateral pararenal inflammatory changes. Consider Forniceal Rupture. 3. Secondary inflammatory involvement of the Duodenum, and small volume of free fluid in both pericolic gutters also thought to be secondary to the renal findings. 4. Mild pulmonary atelectasis possibly superimposed on chronic lower lobe interstitial disease. Mild Emphysema (ICD10-J43.9). 5. Bulky stool ball in the rectum.  Consider fecal impaction. 6. Aortic Atherosclerosis (ICD10-I70.0). Ectatic abdominal aorta at risk for aneurysm development. Recommend followup by ultrasound in 5 years. This recommendation follows ACR consensus guidelines: White Paper of the ACR Incidental Findings Committee II on Vascular Findings. J Am Coll Radiol 2013; 10:789-794. Electronically Signed   By: Odessa Fleming M.D.   On: 06/12/2017 11:40   Dg Abd 1 View  Result Date: 07/04/2017 CLINICAL DATA:  Left ureteral calculus EXAM: ABDOMEN - 1 VIEW COMPARISON:  CT abdomen pelvis 06/12/2017 FINDINGS: Bilateral ureteral stents in good position extending into the bladder. Bilateral nephrostomy tubes in good position overlying the proximal stent bilaterally. No ureteral calculus identified. Note is made of small renal calculi on the prior CT Vertebroplasty at L1.  Constipation.  Normal bowel gas pattern. IMPRESSION: Bilateral ureteral stents in bilateral nephrostomies in good position. No urinary tract calculi identified. Constipation Electronically Signed   By: Marlan Palau M.D.   On: 07/04/2017 09:29   Ct Head Wo Contrast  Result Date: 06/12/2017 CLINICAL DATA:  Altered mental status, sepsis EXAM: CT HEAD WITHOUT CONTRAST TECHNIQUE: Contiguous axial images were obtained from the base of the skull through the vertex without intravenous contrast. COMPARISON:  None. FINDINGS: Brain: No evidence of acute infarction, hemorrhage, extra-axial collection, ventriculomegaly, or mass effect. Right basal ganglia lacunar infarct. Generalized cerebral atrophy. Periventricular white matter low attenuation likely secondary to microangiopathy. Vascular: Cerebrovascular atherosclerotic calcifications are noted. Skull: Negative for fracture or focal lesion. Sinuses/Orbits: Visualized portions of the orbits are unremarkable. Visualized portions of the paranasal sinuses and mastoid air cells are unremarkable. Other: None. IMPRESSION: 1. No acute intracranial pathology. 2. Chronic microvascular disease and cerebral atrophy. Electronically Signed   By: Elige Ko   On: 06/12/2017 11:22   Ct Chest Wo Contrast  Result Date: 06/12/2017 CLINICAL DATA:  72 year old male admitted yesterday with sepsis and altered mental status. EXAM: CT CHEST, ABDOMEN AND PELVIS WITHOUT CONTRAST TECHNIQUE: Multidetector CT imaging of the chest, abdomen and  pelvis was performed following the standard protocol without IV contrast. COMPARISON:  Portable chest 06/11/2017. FINDINGS: CT CHEST FINDINGS Cardiovascular: Vascular patency is not evaluated in the absence of IV contrast. Calcified coronary artery and descending thoracic aortic atherosclerosis. Cardiac size at the upper limits of normal. No pericardial effusion. Mediastinum/Nodes: Negative noncontrast thoracic inlet and mediastinum. No lymphadenopathy. Small volume retained secretions in the thoracic esophagus which is nondilated. Lungs/Pleura: Major airways are patent. Respiratory motion artifact mostly in lower lobes. Upper lobe paraseptal emphysema. Posterior lower lobe and costophrenic angle subpleural opacity which could reflect a combination of atelectasis and chronic pulmonary interstitial changes such as mild honeycombing. No pleural effusion. No consolidation. Musculoskeletal: Osteopenia. Mild chronic appearing T3  superior endplate compression. Chronic left posterior ninth rib fracture. No acute osseous abnormality identified. CT ABDOMEN PELVIS FINDINGS Hepatobiliary: Negative noncontrast liver and gallbladder. Pancreas: Negative. Spleen: Negative. Adrenals/Urinary Tract: Normal adrenal glands. Moderate to severe right and moderate left hydronephrosis. Associated bilateral extensive pararenal soft tissue stranding and mild expansion of the bilateral pararenal spaces. Thickening of the lateral Conal Foss show. Multiple punctate intrarenal calculi on the left. Questionable right midpole nephrolithiasis versus vascular calcification (coronal image 95). Associated bilateral hydroureter and periureteral stranding. The right ureter is abnormal to the level of a 5 x 8 mm calculus lodged just distal to the right ureteropelvic junction corresponding to the L3-L4 spinal level (series 3, image 87). Distal to this calculus there is persistent right periureteral stranding, but the right ureter is more decompressed. The  right UVJ appears normal, however within the right posterior bladder there is an oval 3 x 6 mm calculus (series 3, image 121 and coronal image 98. The left ureter remains abnormal to the ureterovesical junction where an oval 5 x 7 mm calculus is demonstrated on coronal image 101. The urinary bladder is fairly decompressed. No perivesical stranding. Stomach/Bowel: Bulky stool ball in the rectum (sagittal image 102). Redundant sigmoid colon is mildly gas distended and tracks into the epigastrium. The descending colon is decompressed with some retained stool. Trace fluid in the left gutter appears probably related to the left renal space. Negative splenic flexure and transverse colon. Mildly redundant hepatic flexure. Decompressed ascending colon. Trace fluid in the right gutter appears probably related to the right renal space. The right colon is redundant and the cecum is located on a lax mesentery in the right upper abdomen. Negative terminal ileum. Appendix is diminutive or absent. No dilated small bowel. Jejunum and ileum appear within normal limits. The duodenum appears mildly to moderately thickened and inflamed (coronal image 77), but this is felt to be secondary to the right renal findings. Negative stomach. No abdominal free air. Trace abdominal free fluid, primarily in both pericolic gutters. Vascular/Lymphatic: Aortoiliac calcified atherosclerosis. Vascular patency is not evaluated in the absence of IV contrast. Infrarenal abdominal aorta measures up to 25 mm diameter. No lymphadenopathy. Reproductive: Negative. Other: No pelvic free fluid. Musculoskeletal: Previously augmented L1 compression fracture. Osteopenia. Advanced L5-S1 disc degeneration. No acute osseous abnormality identified. IMPRESSION: 1. Moderate to severe bilateral Obstructive Uropathy. - 5 x 8 mm proximal Right ureter calculus about 2 cm distal to the right renal pelvis. - 5 x 7 mm distal Left ureter calculus just proximal to the UVJ. - a  separate 6 mm calculus within the right urinary bladder probably is a recently passed stone. 2. Moderate to severe bilateral pararenal inflammatory changes. Consider Forniceal Rupture. 3. Secondary inflammatory involvement of the Duodenum, and small volume of free fluid in both pericolic gutters also thought to be secondary to the renal findings. 4. Mild pulmonary atelectasis possibly superimposed on chronic lower lobe interstitial disease. Mild Emphysema (ICD10-J43.9). 5. Bulky stool ball in the rectum.  Consider fecal impaction. 6. Aortic Atherosclerosis (ICD10-I70.0). Ectatic abdominal aorta at risk for aneurysm development. Recommend followup by ultrasound in 5 years. This recommendation follows ACR consensus guidelines: White Paper of the ACR Incidental Findings Committee II on Vascular Findings. J Am Coll Radiol 2013; 10:789-794. Electronically Signed   By: Odessa Fleming M.D.   On: 06/12/2017 11:40   Ir Nephro Tube Remov/fl  Result Date: 07/04/2017 INDICATION: History of hydronephrosis and obstructing ureter stones. Patient has bilateral percutaneous nephrostomy tubes and bilateral ureter  stents. Plan for removal of the right nephrostomy tube. EXAM: REMOVAL OF RIGHT NEPHROSTOMY TUBE WITH FLUOROSCOPIC GUIDANCE COMPARISON:  None. MEDICATIONS: None ANESTHESIA/SEDATION: None CONTRAST:  5 mL Isovue-300-administered into the collecting system(s) FLUOROSCOPY TIME:  Fluoroscopy Time: 42 seconds, 10 mGy COMPLICATIONS: None immediate. PROCEDURE: Patient was placed prone on the interventional table. The right flank and existing catheter were prepped and draped in a sterile fashion. Maximal barrier sterile technique was utilized including caps, mask, sterile gowns, sterile gloves, sterile drape, hand hygiene and skin antiseptic. Contrast was injected through the existing nephrostomy tube. The nephrostomy tube was cut and removed using a Bentson wire. Catheter was removed under fluoroscopic guidance in order to avoid  dislodging the ureter stent. The nephrostomy tube was successfully removed without complication. Bandage placed at the old puncture site. FINDINGS: Right nephrostomy tube within the right renal collecting system. Right ureter stent is adequately positioned in the renal pelvis and urinary bladder. Nephrostogram demonstrated a filling defect in the renal pelvis probably representing blood clot because this is larger than the previous ureter stone. Contrast rapidly fills the ureter stent and drains into the urinary bladder. The ureter stent is widely patent. Nephrostomy tube was successfully removed without dislodging the ureter stent. IMPRESSION: Patent right ureter stent. Successful removal of the right nephrostomy tube with fluoroscopic guidance. Filling defect in the right renal pelvis probably related to blood clot. Electronically Signed   By: Richarda Overlie M.D.   On: 07/04/2017 16:35   Dg Chest Portable 1 View  Result Date: 07/02/2017 CLINICAL DATA:  Weakness and shortness of breath. EXAM: PORTABLE CHEST 1 VIEW COMPARISON:  06/12/2017 chest CT FINDINGS: Interstitial coarsening attributed to interstitial lung disease with fibrosis seen on prior CT. No acute superimposed opacity. No effusion or pneumothorax. Normal heart size. IMPRESSION: 1. Mild pulmonary fibrosis. 2. No acute superimposed finding. Electronically Signed   By: Marnee Spring M.D.   On: 07/02/2017 18:10   Dg Chest Portable 1 View  Result Date: 06/11/2017 CLINICAL DATA:  Sepsis EXAM: PORTABLE CHEST 1 VIEW COMPARISON:  None. FINDINGS: Hypoventilation with bibasilar atelectasis. Negative for heart failure or pneumonia. Heart size within normal limits. No significant pleural effusion Advanced degenerative change left shoulder IMPRESSION: Mild bibasilar atelectasis.  Negative for pneumonia. Electronically Signed   By: Marlan Palau M.D.   On: 06/11/2017 11:07   Dg Swallowing Func-speech Pathology  Result Date: 06/23/2017 Objective Swallowing  Evaluation: Type of Study: MBS-Modified Barium Swallow Study  Patient Details Name: Kristapher [place]. MRN: [id number] Date of Birth: Sep 05, 1945 Today's Date: 06/23/2017 Time: SLP Start Time (ACUTE ONLY): 1032 -SLP Stop Time (ACUTE ONLY): 1045 SLP Time Calculation (min) (ACUTE ONLY): 13 min Past Medical History: Past Medical History: Diagnosis Date . Anemia  . Arthritis  . Dementia  . Diabetes mellitus without complication (HCC)  . ETOH abuse  . Myocardial infarction (HCC)  . Peripheral arterial disease (HCC)  . Stroke (HCC)  . Wears dentures  . Wears glasses  Past Surgical History: Past Surgical History: Procedure Laterality Date . ABDOMINAL AORTOGRAM N/A 10/25/2016  Procedure: Abdominal Aortogram;  Surgeon: Sherren Kerns, MD;  Location: [facility] INVASIVE CV LAB;  Service: Cardiovascular;  Laterality: N/A; . BRAIN SURGERY   . CORONARY STENT PLACEMENT    " about 25 years ago (10/29/16) . ENDARTERECTOMY FEMORAL Right 10/30/2016  Procedure: ENDARTERECTOMY FEMORAL-RIGHT WITH PATCH GRAFT;  Surgeon: Sherren Kerns, MD;  Location: [facility] OR;  Service: Vascular;  Laterality: Right; . IR NEPHROSTOMY EXCHANGE LEFT  06/20/2017 .  IR NEPHROSTOMY EXCHANGE RIGHT  06/20/2017 . IR NEPHROSTOMY PLACEMENT LEFT  06/12/2017 . IR NEPHROSTOMY PLACEMENT RIGHT  06/12/2017 . IR URETERAL STENT PLACEMENT EXISTING ACCESS LEFT  06/20/2017 . IR URETERAL STENT PLACEMENT EXISTING ACCESS RIGHT  06/20/2017 . LOWER EXTREMITY ANGIOGRAPHY Right 10/25/2016  Procedure: Lower Extremity Angiography;  Surgeon: Sherren Kerns, MD;  Location: [facility] INVASIVE CV LAB;  Service: Cardiovascular;  Laterality: Right; HPI: 72 year old male admitted with decreased responsiveness, confusion, worsening baseline cough, high fever, difficulty swallowing, decreased po intake. Being treated for candiduria and UTI. CXR negative.  PMH of ETOH abuse, dementia, CVA.  No Data Recorded Assessment / Plan / Recommendation CHL IP CLINICAL IMPRESSIONS 06/23/2017 Clinical Impression Although it is  recommended pt continue honey thick liquids, swallow function has mildly improved marked by ability to initiate a swallow with each trial (unlike prior MBS) and initiation was delayed but faster. Larger sips of nectar thick barium were grossly aspirated without immediate or adequate sensation. When cued for smaller sips nectar he complied 50% of the time, therefore recommend he continue honey thick liquids with continued ST at SN[facility] to practice small sips with nectar and upgrading if/when able. Prolonged mastication with Dys 2 (graham cracker in applesauce) and continue Dys 1 (puree), small sips and purposeful throat clear and cough throughout meals.    SLP Visit Diagnosis Dysphagia, oropharyngeal phase (R13.12) Attention and concentration deficit following -- Frontal lobe and executive function deficit following -- Impact on safety and function Moderate aspiration risk;Severe aspiration risk   CHL IP TREATMENT RECOMMENDATION 06/23/2017 Treatment Recommendations Therapy as outlined in treatment plan below   Prognosis 06/23/2017 Prognosis for Safe Diet Advancement (No Data) Barriers to Reach Goals -- Barriers/Prognosis Comment -- CHL IP DIET RECOMMENDATION 06/23/2017 SLP Diet Recommendations Dysphagia 1 (Puree) solids;Honey thick liquids Liquid Administration via Cup Medication Administration Crushed with puree Compensations Minimize environmental distractions;Slow rate;Small sips/bites Postural Changes Seated upright at 90 degrees   CHL IP OTHER RECOMMENDATIONS 06/23/2017 Recommended Consults -- Oral Care Recommendations Oral care BID Other Recommendations --   CHL IP FOLLOW UP RECOMMENDATIONS 06/23/2017 Follow up Recommendations Skilled Nursing facility   [facility] IP FREQUENCY AND DURATION 06/23/2017 Speech Therapy Frequency (ACUTE ONLY) min 2x/week Treatment Duration 2 weeks      CHL IP ORAL PHASE 06/23/2017 Oral Phase Impaired Oral - Pudding Teaspoon -- Oral - Pudding Cup -- Oral - Honey Teaspoon NT Oral - Honey Cup  Delayed oral transit Oral - Nectar Teaspoon NT Oral - Nectar Cup Delayed oral transit Oral - Nectar Straw Delayed oral transit Oral - Thin Teaspoon -- Oral - Thin Cup -- Oral - Thin Straw -- Oral - Puree NT Oral - Mech Soft Delayed oral transit Oral - Regular -- Oral - Multi-Consistency -- Oral - Pill -- Oral Phase - Comment --  CHL IP PHARYNGEAL PHASE 06/23/2017 Pharyngeal Phase Impaired Pharyngeal- Pudding Teaspoon -- Pharyngeal -- Pharyngeal- Pudding Cup -- Pharyngeal -- Pharyngeal- Honey Teaspoon NT Pharyngeal -- Pharyngeal- Honey Cup Delayed swallow initiation-vallecula;Pharyngeal residue - valleculae;Pharyngeal residue - pyriform Pharyngeal -- Pharyngeal- Nectar Teaspoon NT Pharyngeal -- Pharyngeal- Nectar Cup Pharyngeal residue - pyriform;Penetration/Aspiration during swallow;Delayed swallow initiation-pyriform sinuses Pharyngeal Material enters airway, passes BELOW cords without attempt by patient to eject out (silent aspiration) Pharyngeal- Nectar Straw Penetration/Aspiration during swallow Pharyngeal Material enters airway, remains ABOVE vocal cords and not ejected out Pharyngeal- Thin Teaspoon -- Pharyngeal -- Pharyngeal- Thin Cup -- Pharyngeal -- Pharyngeal- Thin Straw -- Pharyngeal -- Pharyngeal- Puree NT Pharyngeal -- Pharyngeal- Mechanical Soft  Delayed swallow initiation-vallecula Pharyngeal -- Pharyngeal- Regular -- Pharyngeal -- Pharyngeal- Multi-consistency -- Pharyngeal -- Pharyngeal- Pill -- Pharyngeal -- Pharyngeal Comment --  CHL IP CERVICAL ESOPHAGEAL PHASE 06/23/2017 Cervical Esophageal Phase WFL Pudding Teaspoon -- Pudding Cup -- Honey Teaspoon -- Honey Cup -- Nectar Teaspoon -- Nectar Cup -- Nectar Straw -- Thin Teaspoon -- Thin Cup -- Thin Straw -- Puree -- Mechanical Soft -- Regular -- Multi-consistency -- Pill -- Cervical Esophageal Comment -- No flowsheet data found. Royce MacadamiaLitaker, Lisa Willis 06/23/2017, 1:29 PM Breck CoonsLisa Willis Lonell FaceLitaker M.Ed CCC-SLP Pager [phone]              Ir Nephrostomy  Placement Left  Result Date: 06/12/2017 INDICATION: 72 year old with sepsis and bilateral obstructing ureter calculi. Patient needs urgent renal decompression. EXAM: PLACEMENT OF LEFT PERCUTANEOUS NEPHROSTOMY TUBE WITH ULTRASOUND AND FLUOROSCOPIC GUIDANCE PLACEMENT OF RIGHT PERCUTANEOUS NEPHROSTOMY TUBE WITH ULTRASOUND AND FLUOROSCOPIC GUIDANCE COMPARISON:  None. MEDICATIONS: Ciprofloxacin 400 mg; The antibiotic was administered in an appropriate time frame prior to skin puncture. ANESTHESIA/SEDATION: Fentanyl 25 mcg IV; Versed 1.0 mg IV Moderate Sedation Time:  30 minutes The patient was continuously monitored during the procedure by the interventional radiology nurse under my direct supervision. CONTRAST:  15 mL - administered into the collecting system(s) FLUOROSCOPY TIME:  Fluoroscopy Time: 2 minutes 18 seconds (8 mGy). COMPLICATIONS: None immediate. PROCEDURE: Informed written consent was obtained from the patient after a thorough discussion of the procedural risks, benefits and alternatives. All questions were addressed. Maximal Sterile Barrier Technique was utilized including caps, mask, sterile gowns, sterile gloves, sterile drape, hand hygiene and skin antiseptic. A timeout was performed prior to the initiation of the procedure. Patient was placed prone. Both flanks were prepped and draped in a sterile fashion. Left flank was anesthetized with 1% lidocaine. 22 gauge Chiba needle was dilated into a mid/ lower pole calyx with ultrasound guidance. Wire was easily advanced in the renal collecting system. Accustick dilator set was placed. Tract was dilated to accommodate a 10.2 [place] multipurpose drain. Blood tinged urine was collected and sent for culture. Small amount of contrast confirmed placement in the renal collecting system. Catheter was sutured to skin. Right flank was anesthetized with 1% lidocaine. Using ultrasound guidance, 22 gauge needle was directed into a mid/ lower pole calyx. Small amount  of purulent fluid was draining from the needle. 0.018 wire was advanced to the renal pelvis. Accustick dilator set was placed and the tract was dilated to accommodate a 10.2 [place] multipurpose drain. Bloody fluid was aspirated and sent for culture. Small amount of contrast confirmed placement in the renal collecting system. Catheter was sutured to skin. Both tubes were connected to gravity bags. Fluoroscopic and ultrasound images were taken and saved for documentation. FINDINGS: Moderate left hydronephrosis. Left nephrostomy tube is well positioned in the left renal pelvis. Moderate-severe right hydronephrosis. Obstructing stone in the proximal right ureter. Nephrostomy tube is reconstituted in the right renal pelvis. Bloody fluid was removed from the right renal collecting system. IMPRESSION: Successful bilateral percutaneous nephrostomy tubes using ultrasound and fluoroscopic guidance. Bilateral nephrostomy tube samples were sent for culture. Electronically Signed   By: Richarda OverlieAdam  Henn M.D.   On: 06/12/2017 17:50   Ir Nephrostomy Placement Right  Result Date: 06/12/2017 INDICATION: 72 year old with sepsis and bilateral obstructing ureter calculi. Patient needs urgent renal decompression. EXAM: PLACEMENT OF LEFT PERCUTANEOUS NEPHROSTOMY TUBE WITH ULTRASOUND AND FLUOROSCOPIC GUIDANCE PLACEMENT OF RIGHT PERCUTANEOUS NEPHROSTOMY TUBE WITH ULTRASOUND AND FLUOROSCOPIC GUIDANCE COMPARISON:  None. MEDICATIONS: Ciprofloxacin 400 mg;  The antibiotic was administered in an appropriate time frame prior to skin puncture. ANESTHESIA/SEDATION: Fentanyl 25 mcg IV; Versed 1.0 mg IV Moderate Sedation Time:  30 minutes The patient was continuously monitored during the procedure by the interventional radiology nurse under my direct supervision. CONTRAST:  15 mL - administered into the collecting system(s) FLUOROSCOPY TIME:  Fluoroscopy Time: 2 minutes 18 seconds (8 mGy). COMPLICATIONS: None immediate. PROCEDURE: Informed written  consent was obtained from the patient after a thorough discussion of the procedural risks, benefits and alternatives. All questions were addressed. Maximal Sterile Barrier Technique was utilized including caps, mask, sterile gowns, sterile gloves, sterile drape, hand hygiene and skin antiseptic. A timeout was performed prior to the initiation of the procedure. Patient was placed prone. Both flanks were prepped and draped in a sterile fashion. Left flank was anesthetized with 1% lidocaine. 22 gauge Chiba needle was dilated into a mid/ lower pole calyx with ultrasound guidance. Wire was easily advanced in the renal collecting system. Accustick dilator set was placed. Tract was dilated to accommodate a 10.2 [place] multipurpose drain. Blood tinged urine was collected and sent for culture. Small amount of contrast confirmed placement in the renal collecting system. Catheter was sutured to skin. Right flank was anesthetized with 1% lidocaine. Using ultrasound guidance, 22 gauge needle was directed into a mid/ lower pole calyx. Small amount of purulent fluid was draining from the needle. 0.018 wire was advanced to the renal pelvis. Accustick dilator set was placed and the tract was dilated to accommodate a 10.2 [place] multipurpose drain. Bloody fluid was aspirated and sent for culture. Small amount of contrast confirmed placement in the renal collecting system. Catheter was sutured to skin. Both tubes were connected to gravity bags. Fluoroscopic and ultrasound images were taken and saved for documentation. FINDINGS: Moderate left hydronephrosis. Left nephrostomy tube is well positioned in the left renal pelvis. Moderate-severe right hydronephrosis. Obstructing stone in the proximal right ureter. Nephrostomy tube is reconstituted in the right renal pelvis. Bloody fluid was removed from the right renal collecting system. IMPRESSION: Successful bilateral percutaneous nephrostomy tubes using ultrasound and fluoroscopic  guidance. Bilateral nephrostomy tube samples were sent for culture. Electronically Signed   By: Richarda Overlie M.D.   On: 06/12/2017 17:50   Ir Nephrostomy Exchange Left  Result Date: 06/20/2017 INDICATION: 72 year old male with a history of bilateral hydronephrosis secondary to ureteral stones. EXAM: IR EXCHANGE NEPHROSTOMY LEFT; IR EXCHANGE NEPHROSTOMY RIGHT; IR URETURAL STENT PLACEMENT EXISTING ACCESS LEFT; IR URETURAL STENT PLACEMENT EXISTING ACCESS RIGHT COMPARISON:  CT 06/12/2017 MEDICATIONS: None ANESTHESIA/SEDATION: None CONTRAST:  20 cc - administered into the collecting system(s) FLUOROSCOPY TIME:  Fluoroscopy Time: 9 minutes 0 seconds (46 mGy). COMPLICATIONS: None PROCEDURE: Informed written consent was obtained from the patient after a thorough discussion of the procedural risks, benefits and alternatives. All questions were addressed. Maximal Sterile Barrier Technique was utilized including caps, mask, sterile gowns, sterile gloves, sterile drape, hand hygiene and skin antiseptic. A timeout was performed prior to the initiation of the procedure. Patient position prone position on the fluoroscopy table. Bilateral indwelling percutaneous nephrostomy and the bilateral flank were prepped and draped in the usual sterile fashion. The left percutaneous nephrostomy was injected with contrast with images stored and sent to PACs. 1% lidocaine was used for local anesthesia at the skin site in the retention suture was ligated. Catheter was ligated and a Bentson wire was passed into the collecting system. Catheter was removed and then a Kumpe the catheter was passed over the  wire, navigating the Kumpe the catheter to the mid descending ureter. Antegrade nephrostogram performed confirming occlusion at the distal ureter at the ureterovesical junction. Combination of the Bentson wire, Glidewire, and a roadrunner wire were used to navigate the Kumpe the catheter beyond the stone at the ureterovesical junction into the  urinary bladder. Contrast confirmed location within the lumen of the urinary bladder. Withdrawal of the wire was used to estimate a length of 26 cm ureteral stent length. Stiff 035 wire was advanced into the urinary bladder, and the selected ureteral stent was deployed. A new 10 French pigtail catheter was placed in the collecting system. Final image was stored after contrast infusion. Catheter was sutured in position and a capped. The right was then addressed. The initial images were stored after infusion of contrast. 1% lidocaine was used for local anesthesia and a retention suture was ligated. Catheter was ligated and a Bentson wire was passed into the collecting system. Catheter was removed an a Kumpe the catheter was used to navigate the catheter into the descending ureter. Antegrade nephrostogram was performed, with no obstruction identified. Combination the Bentson wire, roadrunner wire were used navigate the catheter into the urinary bladder in the wire was withdrawn estimating 28 cm length for the stent. Contrast confirmed location of the catheter within the urinary bladder. 035 stiff wire was placed, and the selected 28 cm ureteral stent was deployed. A new 10 French pigtail catheter was placed in the collecting system system. Contrast infusion confirmed location. Final images restored. Retention suture was placed on the right. Patient tolerated procedure well and remained hemodynamically stable throughout. No complications were encountered and no significant blood loss. IMPRESSION: Status post bilateral antegrade nephrostogram via indwelling percutaneous nephrostomy tubes confirming obstructing stone at the left ureterovesical junction, and no occlusion of the right ureter. A 26 cm ureteral stent was placed on the left and a 28 ureteral stent was placed on the right. Percutaneous nephrostomy tubes were both replaced and capped. Signed, Yvone Neu. Loreta Ave, DO [facility]  [facility] Radiology Electronically Signed   By: Gilmer Mor D.O.   On: 06/20/2017 17:37   Ir Nephrostomy Exchange Right  Result Date: 06/20/2017 INDICATION: 72 year old male with a history of bilateral hydronephrosis secondary to ureteral stones. EXAM: IR EXCHANGE NEPHROSTOMY LEFT; IR EXCHANGE NEPHROSTOMY RIGHT; IR URETURAL STENT PLACEMENT EXISTING ACCESS LEFT; IR URETURAL STENT PLACEMENT EXISTING ACCESS RIGHT COMPARISON:  CT 06/12/2017 MEDICATIONS: None ANESTHESIA/SEDATION: None CONTRAST:  20 cc - administered into the collecting system(s) FLUOROSCOPY TIME:  Fluoroscopy Time: 9 minutes 0 seconds (46 mGy). COMPLICATIONS: None PROCEDURE: Informed written consent was obtained from the patient after a thorough discussion of the procedural risks, benefits and alternatives. All questions were addressed. Maximal Sterile Barrier Technique was utilized including caps, mask, sterile gowns, sterile gloves, sterile drape, hand hygiene and skin antiseptic. A timeout was performed prior to the initiation of the procedure. Patient position prone position on the fluoroscopy table. Bilateral indwelling percutaneous nephrostomy and the bilateral flank were prepped and draped in the usual sterile fashion. The left percutaneous nephrostomy was injected with contrast with images stored and sent to PACs. 1% lidocaine was used for local anesthesia at the skin site in the retention suture was ligated. Catheter was ligated and a Bentson wire was passed into the collecting system. Catheter was removed and then a Kumpe the catheter was passed over the wire, navigating the Kumpe the catheter to the mid descending ureter. Antegrade nephrostogram performed confirming occlusion at the distal  ureter at the ureterovesical junction. Combination of the Bentson wire, Glidewire, and a roadrunner wire were used to navigate the Kumpe the catheter beyond the stone at the ureterovesical junction into the urinary bladder. Contrast confirmed  location within the lumen of the urinary bladder. Withdrawal of the wire was used to estimate a length of 26 cm ureteral stent length. Stiff 035 wire was advanced into the urinary bladder, and the selected ureteral stent was deployed. A new 10 French pigtail catheter was placed in the collecting system. Final image was stored after contrast infusion. Catheter was sutured in position and a capped. The right was then addressed. The initial images were stored after infusion of contrast. 1% lidocaine was used for local anesthesia and a retention suture was ligated. Catheter was ligated and a Bentson wire was passed into the collecting system. Catheter was removed an a Kumpe the catheter was used to navigate the catheter into the descending ureter. Antegrade nephrostogram was performed, with no obstruction identified. Combination the Bentson wire, roadrunner wire were used navigate the catheter into the urinary bladder in the wire was withdrawn estimating 28 cm length for the stent. Contrast confirmed location of the catheter within the urinary bladder. 035 stiff wire was placed, and the selected 28 cm ureteral stent was deployed. A new 10 French pigtail catheter was placed in the collecting system system. Contrast infusion confirmed location. Final images restored. Retention suture was placed on the right. Patient tolerated procedure well and remained hemodynamically stable throughout. No complications were encountered and no significant blood loss. IMPRESSION: Status post bilateral antegrade nephrostogram via indwelling percutaneous nephrostomy tubes confirming obstructing stone at the left ureterovesical junction, and no occlusion of the right ureter. A 26 cm ureteral stent was placed on the left and a 28 ureteral stent was placed on the right. Percutaneous nephrostomy tubes were both replaced and capped. Signed, Yvone Neu. Loreta Ave, DO [facility] Electronically  Signed   By: Gilmer Mor D.O.   On: 06/20/2017 17:37   Ir Ureteral Stent Placement Existing Access Left  Result Date: 06/20/2017 INDICATION: 72 year old male with a history of bilateral hydronephrosis secondary to ureteral stones. EXAM: IR EXCHANGE NEPHROSTOMY LEFT; IR EXCHANGE NEPHROSTOMY RIGHT; IR URETURAL STENT PLACEMENT EXISTING ACCESS LEFT; IR URETURAL STENT PLACEMENT EXISTING ACCESS RIGHT COMPARISON:  CT 06/12/2017 MEDICATIONS: None ANESTHESIA/SEDATION: None CONTRAST:  20 cc - administered into the collecting system(s) FLUOROSCOPY TIME:  Fluoroscopy Time: 9 minutes 0 seconds (46 mGy). COMPLICATIONS: None PROCEDURE: Informed written consent was obtained from the patient after a thorough discussion of the procedural risks, benefits and alternatives. All questions were addressed. Maximal Sterile Barrier Technique was utilized including caps, mask, sterile gowns, sterile gloves, sterile drape, hand hygiene and skin antiseptic. A timeout was performed prior to the initiation of the procedure. Patient position prone position on the fluoroscopy table. Bilateral indwelling percutaneous nephrostomy and the bilateral flank were prepped and draped in the usual sterile fashion. The left percutaneous nephrostomy was injected with contrast with images stored and sent to PACs. 1% lidocaine was used for local anesthesia at the skin site in the retention suture was ligated. Catheter was ligated and a Bentson wire was passed into the collecting system. Catheter was removed and then a Kumpe the catheter was passed over the wire, navigating the Kumpe the catheter to the mid descending ureter. Antegrade nephrostogram performed confirming occlusion at the distal ureter at the ureterovesical junction. Combination of the Bentson wire, Glidewire, and a roadrunner wire were  used to navigate the Kumpe the catheter beyond the stone at the ureterovesical junction into the urinary bladder. Contrast confirmed location within the  lumen of the urinary bladder. Withdrawal of the wire was used to estimate a length of 26 cm ureteral stent length. Stiff 035 wire was advanced into the urinary bladder, and the selected ureteral stent was deployed. A new 10 French pigtail catheter was placed in the collecting system. Final image was stored after contrast infusion. Catheter was sutured in position and a capped. The right was then addressed. The initial images were stored after infusion of contrast. 1% lidocaine was used for local anesthesia and a retention suture was ligated. Catheter was ligated and a Bentson wire was passed into the collecting system. Catheter was removed an a Kumpe the catheter was used to navigate the catheter into the descending ureter. Antegrade nephrostogram was performed, with no obstruction identified. Combination the Bentson wire, roadrunner wire were used navigate the catheter into the urinary bladder in the wire was withdrawn estimating 28 cm length for the stent. Contrast confirmed location of the catheter within the urinary bladder. 035 stiff wire was placed, and the selected 28 cm ureteral stent was deployed. A new 10 French pigtail catheter was placed in the collecting system system. Contrast infusion confirmed location. Final images restored. Retention suture was placed on the right. Patient tolerated procedure well and remained hemodynamically stable throughout. No complications were encountered and no significant blood loss. IMPRESSION: Status post bilateral antegrade nephrostogram via indwelling percutaneous nephrostomy tubes confirming obstructing stone at the left ureterovesical junction, and no occlusion of the right ureter. A 26 cm ureteral stent was placed on the left and a 28 ureteral stent was placed on the right. Percutaneous nephrostomy tubes were both replaced and capped. Signed, Yvone Neu. Loreta Ave, DO [facility] Radiology Electronically Signed   By: Gilmer Mor D.O.   On: 06/20/2017 17:37   Ir Ureteral Stent Placement Existing Access Right  Result Date: 06/20/2017 INDICATION: 72 year old male with a history of bilateral hydronephrosis secondary to ureteral stones. EXAM: IR EXCHANGE NEPHROSTOMY LEFT; IR EXCHANGE NEPHROSTOMY RIGHT; IR URETURAL STENT PLACEMENT EXISTING ACCESS LEFT; IR URETURAL STENT PLACEMENT EXISTING ACCESS RIGHT COMPARISON:  CT 06/12/2017 MEDICATIONS: None ANESTHESIA/SEDATION: None CONTRAST:  20 cc - administered into the collecting system(s) FLUOROSCOPY TIME:  Fluoroscopy Time: 9 minutes 0 seconds (46 mGy). COMPLICATIONS: None PROCEDURE: Informed written consent was obtained from the patient after a thorough discussion of the procedural risks, benefits and alternatives. All questions were addressed. Maximal Sterile Barrier Technique was utilized including caps, mask, sterile gowns, sterile gloves, sterile drape, hand hygiene and skin antiseptic. A timeout was performed prior to the initiation of the procedure. Patient position prone position on the fluoroscopy table. Bilateral indwelling percutaneous nephrostomy and the bilateral flank were prepped and draped in the usual sterile fashion. The left percutaneous nephrostomy was injected with contrast with images stored and sent to PACs. 1% lidocaine was used for local anesthesia at the skin site in the retention suture was ligated. Catheter was ligated and a Bentson wire was passed into the collecting system. Catheter was removed and then a Kumpe the catheter was passed over the wire, navigating the Kumpe the catheter to the mid descending ureter. Antegrade nephrostogram performed confirming occlusion at the distal ureter at the ureterovesical junction. Combination of the Bentson wire, Glidewire, and a roadrunner wire were used to navigate the Kumpe the catheter beyond the stone at the ureterovesical junction into the  urinary bladder. Contrast confirmed location within the lumen of the urinary  bladder. Withdrawal of the wire was used to estimate a length of 26 cm ureteral stent length. Stiff 035 wire was advanced into the urinary bladder, and the selected ureteral stent was deployed. A new 10 French pigtail catheter was placed in the collecting system. Final image was stored after contrast infusion. Catheter was sutured in position and a capped. The right was then addressed. The initial images were stored after infusion of contrast. 1% lidocaine was used for local anesthesia and a retention suture was ligated. Catheter was ligated and a Bentson wire was passed into the collecting system. Catheter was removed an a Kumpe the catheter was used to navigate the catheter into the descending ureter. Antegrade nephrostogram was performed, with no obstruction identified. Combination the Bentson wire, roadrunner wire were used navigate the catheter into the urinary bladder in the wire was withdrawn estimating 28 cm length for the stent. Contrast confirmed location of the catheter within the urinary bladder. 035 stiff wire was placed, and the selected 28 cm ureteral stent was deployed. A new 10 French pigtail catheter was placed in the collecting system system. Contrast infusion confirmed location. Final images restored. Retention suture was placed on the right. Patient tolerated procedure well and remained hemodynamically stable throughout. No complications were encountered and no significant blood loss. IMPRESSION: Status post bilateral antegrade nephrostogram via indwelling percutaneous nephrostomy tubes confirming obstructing stone at the left ureterovesical junction, and no occlusion of the right ureter. A 26 cm ureteral stent was placed on the left and a 28 ureteral stent was placed on the right. Percutaneous nephrostomy tubes were both replaced and capped. Signed, Yvone Neu. Loreta Ave, DO [facility] Radiology Electronically Signed   By: Gilmer Mor D.O.   On:  06/20/2017 17:37     Subjective: Having some low back pain which is chronic, but denies abdominal pain or discomfort.  Having regular BMs. Denies chest pains, SOB.    Discharge Exam: Vitals:   07/06/17 2100 07/07/17 0500  BP: 135/61 137/75  Pulse: 92 90  Resp: 18 18  Temp: 98.2 F (36.8 C) 98.4 F (36.9 C)  SpO2: 100% 100%   Vitals:   07/06/17 0500 07/06/17 0652 07/06/17 2100 07/07/17 0500  BP:  132/60 135/61 137/75  Pulse:  90 92 90  Resp:  19 18 18   Temp:  98.4 F (36.9 C) 98.2 F (36.8 C) 98.4 F (36.9 C)  TempSrc:  Oral Oral Oral  SpO2:  100% 100% 100%  Weight: 74.3 kg (163 lb 12.8 oz)   72.1 kg (158 lb 15.2 oz)  Height:        General: Pt is alert, awake, not in acute distress, sitting in chair Cardiovascular: RRR, S1/S2 +, no rubs, no gallops Respiratory: CTA bilaterally, no wheezing, no rhonchi Abdominal: Soft, NT, ND, bowel sounds + Extremities: no edema, 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     Microbiology: Recent Results (from the past 240 hour(s))  Culture, blood (routine x 2)     Status: None (Preliminary result)   Collection Time: 07/02/17  6:00 PM  Result Value Ref Range Status   Specimen Description BLOOD RIGHT HAND  Final   Special Requests   Final    BOTTLES DRAWN AEROBIC AND ANAEROBIC Blood  Culture adequate volume   Culture   Final    NO GROWTH 4 DAYS Performed at [facility], [address] [place], [place] [address]    Report Status PENDING  Incomplete  Culture, blood (routine x 2)     Status: None (Preliminary result)   Collection Time: 07/02/17  6:30 PM  Result Value Ref Range Status   Specimen Description BLOOD RIGHT ARM  Final   Special Requests   Final    BOTTLES DRAWN AEROBIC AND ANAEROBIC Blood Culture adequate volume    Culture   Final    NO GROWTH 4 DAYS Performed at [facility], [address] [place], [place] [address]    Report Status PENDING  Incomplete  Urine culture     Status: Abnormal   Collection Time: 07/02/17  9:18 PM  Result Value Ref Range Status   Specimen Description URINE, RANDOM  Final   Special Requests NONE  Final   Culture (A)  Final    >=100,000 COLONIES/mL KLEBSIELLA PNEUMONIAE Confirmed Extended Spectrum Beta-Lactamase Producer (ESBL).  In bloodstream infections from ESBL organisms, carbapenems are preferred over piperacillin/tazobactam. They are shown to have a lower risk of mortality.    Report Status 07/05/2017 FINAL  Final   Organism ID, Bacteria KLEBSIELLA PNEUMONIAE (A)  Final      Susceptibility   Klebsiella pneumoniae - MIC*    AMPICILLIN >=32 RESISTANT Resistant     CEFAZOLIN >=64 RESISTANT Resistant     CEFTRIAXONE >=64 RESISTANT Resistant     CIPROFLOXACIN 1 SENSITIVE Sensitive     GENTAMICIN >=16 RESISTANT Resistant     IMIPENEM <=0.25 SENSITIVE Sensitive     NITROFURANTOIN 64 INTERMEDIATE Intermediate     TRIMETH/SULFA >=320 RESISTANT Resistant     AMPICILLIN/SULBACTAM >=32 RESISTANT Resistant     PIP/TAZO 8 SENSITIVE Sensitive     Extended ESBL POSITIVE Resistant     ERTAPENEM Value in next row Sensitive      SENSITIVE<=0.5Performed at [facility], [address], [place], [place] [address]    * >=100,000 COLONIES/mL KLEBSIELLA PNEUMONIAE  Culture, Urine     Status: Abnormal   Collection Time: 07/04/17  1:46 AM  Result Value Ref Range Status   Specimen Description URINE, CATHETERIZED LEFT NEPHROSTOMY  Final   Special Requests NONE  Final   Culture (A)  Final    >=100,000 COLONIES/mL KLEBSIELLA PNEUMONIAE Confirmed Extended Spectrum Beta-Lactamase Producer (ESBL).  In bloodstream infections from ESBL organisms, carbapenems are preferred over piperacillin/tazobactam. They are shown to have a lower risk of mortality. Performed at [facility], [address], [place], [place] [address]    Report Status 07/06/2017 FINAL  Final   Organism ID, Bacteria KLEBSIELLA PNEUMONIAE (A)  Final      Susceptibility   Klebsiella pneumoniae - MIC*    AMPICILLIN >=32 RESISTANT Resistant     CEFAZOLIN >=64 RESISTANT Resistant     CEFTRIAXONE >=64 RESISTANT Resistant     CIPROFLOXACIN 1 SENSITIVE Sensitive     GENTAMICIN >=16 RESISTANT Resistant     IMIPENEM <=0.25 SENSITIVE Sensitive     NITROFURANTOIN 128 RESISTANT Resistant     TRIMETH/SULFA >=320 RESISTANT Resistant     AMPICILLIN/SULBACTAM >=32 RESISTANT Resistant     PIP/TAZO 32 INTERMEDIATE Intermediate     Extended ESBL POSITIVE Resistant     * >=100,000 COLONIES/mL KLEBSIELLA PNEUMONIAE  Culture, Urine     Status: Abnormal   Collection Time: 07/04/17  6:24 AM  Result Value Ref Range Status   Specimen Description URINE, CATHETERIZED RT NEPHROSTOMY  Final   Special Requests NONE  Final   Culture (A)  Final    >=100,000 COLONIES/mL KLEBSIELLA PNEUMONIAE Confirmed Extended Spectrum Beta-Lactamase Producer (ESBL).  In bloodstream infections from ESBL organisms, carbapenems are preferred over piperacillin/tazobactam. They are shown to have a lower risk of mortality. Performed at [facility], [address], [place], [place] [address]    Report Status 07/06/2017 FINAL  Final   Organism ID, Bacteria KLEBSIELLA PNEUMONIAE (A)  Final      Susceptibility   Klebsiella pneumoniae - MIC*    AMPICILLIN >=32 RESISTANT Resistant     CEFAZOLIN >=64 RESISTANT Resistant     CEFTRIAXONE >=64 RESISTANT Resistant     CIPROFLOXACIN 1 SENSITIVE Sensitive     GENTAMICIN >=16 RESISTANT Resistant     IMIPENEM <=0.25 SENSITIVE Sensitive     NITROFURANTOIN 128 RESISTANT Resistant     TRIMETH/SULFA >=320 RESISTANT Resistant     AMPICILLIN/SULBACTAM >=32 RESISTANT Resistant     PIP/TAZO 8 SENSITIVE Sensitive     Extended ESBL POSITIVE Resistant     * >=100,000 COLONIES/mL KLEBSIELLA  PNEUMONIAE     Labs: BNP (last 3 results) No results for input(s): BNP in the last 8760 hours. Basic Metabolic Panel: Recent Labs  Lab 07/03/17 0427 07/04/17 0806 07/05/17 0436 07/06/17 0409 07/07/17 0348  NA 136 134* 134* 140 140  K 3.1* 2.8* 3.6 3.1* 3.7  CL 101 97* 97* 98* 99*  CO2 27 31 29  34* 32  GLUCOSE 119* 139* 343* 158* 124*  BUN 10 10 13 12 15   CREATININE 0.68 0.71 0.86 0.67 0.67  CALCIUM 7.8* 7.8* 7.8* 8.3* 8.8*  MG  --  1.3*  --  1.8 2.1   Liver Function Tests: Recent Labs  Lab 07/02/17 1732  AST 24  ALT 17  ALKPHOS 89  BILITOT 0.4  PROT 7.3  ALBUMIN 3.0*   No results for input(s): LIPASE, AMYLASE in the last 168 hours. No results for input(s): AMMONIA in the last 168 hours. CBC: Recent Labs  Lab 07/02/17 1732 07/03/17 0427 07/04/17 0806  WBC 6.3 6.2 8.8  NEUTROABS 3.8  --   --   HGB 10.1* 9.5* 8.5*  HCT 30.9* 29.5* 25.2*  MCV 89.8 90.2 89.0  PLT 262 219 198   Cardiac Enzymes: No results for input(s): CKTOTAL, CKMB, CKMBINDEX, TROPONINI in the last 168 hours. BNP: Invalid input(s): POCBNP CBG: Recent Labs  Lab 07/06/17 0802 07/06/17 1239 07/06/17 1714 07/06/17 2148 07/07/17 0723  GLUCAP 168* 261* 243* 218* 114*   D-Dimer No results for input(s): DDIMER in the last 72 hours. Hgb A1c No results for input(s): HGBA1C in the last 72 hours. Lipid Profile No results for input(s): CHOL, HDL, LDLCALC, TRIG, CHOLHDL, LDLDIRECT in the last 72 hours. Thyroid function studies No results for input(s): TSH, T4TOTAL, T3FREE, THYROIDAB in the last 72 hours.  Invalid input(s): FREET3 Anemia work up No results for input(s): VITAMINB12, FOLATE, FERRITIN, TIBC, IRON, RETICCTPCT in the last 72 hours. Urinalysis    Component Value Date/Time   COLORURINE YELLOW 07/02/2017 2117   APPEARANCEUR CLOUDY (A) 07/02/2017 2117   LABSPEC 1.008 07/02/2017 2117   PHURINE 6.0 07/02/2017 2117   GLUCOSEU 150 (A) 07/02/2017 2117   HGBUR MODERATE (A) 07/02/2017  2117   BILIRUBINUR NEGATIVE 07/02/2017 2117   KETONESUR NEGATIVE 07/02/2017 2117   PROTEINUR 30 (A) 07/02/2017 2117   NITRITE NEGATIVE 07/02/2017 2117  LEUKOCYTESUR LARGE (A) 07/02/2017 2117   Sepsis Labs Invalid input(s): PROCALCITONIN,  WBC,  LACTICIDVEN   Time coordinating discharge: Over 30 minutes  SIGNED:   Renae Fickle, MD  [facility] 07/07/2017, 10:28 AM Pager   If 7PM-7AM, please contact night-coverage [email or web address] Password [email or web address]

## 2017-07-07 NOTE — NC FL2 (Signed)
[place] MEDICAID FL2 LEVEL OF CARE SCREENING TOOL     IDENTIFICATION  Patient Name: Alan [name]. Birthdate: 28-May-1946 Sex: male Admission Date (Current Location): 07/02/2017  [facility] and [place] Number:  Producer, television/film/video and Address:  [facility],  [address], [place] [address]      Provider Number: [id number]  Attending Physician Name and Address:  Renae Fickle, MD  Relative Name and Phone Number:  Kowen Kluth, [phone]    Current Level of Care: Hospital Recommended Level of Care: Skilled Nursing Facility Prior Approval Number:    Date Approved/Denied:   PASRR Number: [id number]  Discharge Plan: SNF    Current Diagnoses: Patient Active Problem List   Diagnosis Date Noted  . 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 Urinary tract infection associated with nephrostomy catheter (HCC) 07/07/2017  . Acute urinary retention 07/07/2017  . Acute metabolic encephalopathy 07/07/2017  . Sepsis (HCC) 07/02/2017  . Candida tropicalis infection 06/20/2017  . Bilateral nephrolithiasis 06/20/2017  . History of nephrostomy (HCC)   . Candidemia (HCC) 06/15/2017  . Obstruction of kidney   . 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    Orientation RESPIRATION BLADDER Height & Weight     Self, Place  Normal Incontinent, Indwelling catheter Weight: 158 lb 15.2 oz (72.1 kg) Height:  6' (182.9 cm)  BEHAVIORAL SYMPTOMS/MOOD NEUROLOGICAL BOWEL NUTRITION STATUS      Incontinent Diet(Healthy heart/carb modified, nectar thick liquids )  AMBULATORY STATUS COMMUNICATION OF NEEDS Skin   Total Care Verbally Normal                       Personal Care Assistance Level of Assistance  Bathing, Feeding, Dressing Bathing Assistance: Maximum assistance Feeding assistance: Limited assistance Dressing Assistance: Maximum assistance     Functional  Limitations Info  Sight, Hearing, Speech Sight Info: Adequate Hearing Info: Impaired Speech Info: Impaired(slurred)    SPECIAL CARE FACTORS FREQUENCY  PT (By licensed PT)     PT Frequency: Min 3x/week              Contractures Contractures Info: Not present    Additional Factors Info  Code Status, Allergies, Isolation Precautions Code Status Info: DNR Allergies Info: Penicillins     Isolation Precautions Info: Isolation: Contact precautions Infection: ESBL, MRSA     Current Medications (07/07/2017):  This is the current hospital active medication list Current Facility-Administered Medications  Medication Dose Route Frequency Provider Last Rate Last Dose  . acetaminophen (TYLENOL) tablet 650 mg  650 mg Oral TID Haydee Salter, MD   650 mg at 07/07/17 0929  . aspirin EC tablet 81 mg  81 mg Oral Daily Haydee Salter, MD   81 mg at 07/07/17 8469  . atorvastatin (LIPITOR) tablet 10 mg  10 mg Oral QPM Haydee Salter, MD   10 mg at 07/06/17 1807  . enoxaparin (LOVENOX) injection 40 mg  40 mg Subcutaneous QHS Haydee Salter, MD   40 mg at 07/06/17 2252  . ertapenem (INVANZ) 1 g in sodium chloride 0.9 % 50 mL IVPB  1 g Intravenous Q24H Renae Fickle, MD 100 mL/hr at 07/07/17 1257 1 g at 07/07/17 1257  . fluticasone (FLONASE) 50 MCG/ACT nasal spray 1 spray  1 spray Each Nare Daily Haydee Salter, MD   1 spray at 07/06/17 0848  . food thickener (THICK IT) powder  Oral PRN Haydee SalterHobbs, Phillip M, MD      . hydrocerin (EUCERIN) cream 1 application  1 application Topical Daily PRN Haydee SalterHobbs, Phillip M, MD      . ibuprofen (ADVIL,MOTRIN) tablet 800 mg  800 mg Oral Q6H PRN Short, Mackenzie, MD      . insulin aspart (novoLOG) injection 0-15 Units  0-15 Units Subcutaneous TID WC Renae FickleShort, Mackenzie, MD   5 Units at 07/07/17 1259  . insulin aspart (novoLOG) injection 0-5 Units  0-5 Units Subcutaneous Stephens ShireQHS Short, Mackenzie, MD   2 Units at 07/06/17 2256  . insulin aspart (novoLOG) injection 10  Units  10 Units Subcutaneous TID WC Renae FickleShort, Mackenzie, MD   10 Units at 07/07/17 1259  . insulin glargine (LANTUS) injection 40 Units  40 Units Subcutaneous Stephens ShireQHS Short, Mackenzie, MD   40 Units at 07/06/17 2306  . iopamidol (ISOVUE-300) 61 % injection           . lidocaine (LIDODERM) 5 % 1 patch  1 patch Transdermal QHS Haydee SalterHobbs, Phillip M, MD   1 patch at 07/06/17 2250  . LORazepam (ATIVAN) tablet 0.5 mg  0.5 mg Oral BID Haydee SalterHobbs, Phillip M, MD   0.5 mg at 07/07/17 [id number]  . magnesium oxide (MAG-OX) tablet 400 mg  400 mg Oral BID Renae FickleShort, Mackenzie, MD   400 mg at 07/07/17 0929  . ondansetron (ZOFRAN) tablet 4 mg  4 mg Oral Q6H PRN Haydee SalterHobbs, Phillip M, MD       Or  . ondansetron [facility](ZOFRAN) injection 4 mg  4 mg Intravenous Q6H PRN Haydee SalterHobbs, Phillip M, MD      . polyethylene glycol (MIRALAX / GLYCOLAX) packet 17 g  17 g Oral BID Renae FickleShort, Mackenzie, MD   17 g at 07/07/17 0929  . senna (SENOKOT) tablet 17.2 mg  2 tablet Oral Stephens ShireQHS Short, Mackenzie, MD   17.2 mg at 07/05/17 2105  . sertraline (ZOLOFT) tablet 50 mg  50 mg Oral BID Haydee SalterHobbs, Phillip M, MD   50 mg at 07/07/17 [id number]  . sodium chloride flush (NS) 0.9 % injection 10-40 mL  10-40 mL Intracatheter PRN Short, Mackenzie, MD      . tamsulosin (FLOMAX) capsule 0.4 mg  0.4 mg Oral QPM Haydee SalterHobbs, Phillip M, MD   0.4 mg at 07/06/17 1808  . traZODone (DESYREL) tablet 50 mg  50 mg Oral QHS Haydee SalterHobbs, Phillip M, MD   50 mg at 07/06/17 2256  . zinc oxide (BALMEX) 11.3 % cream 1 application  1 application Topical 6 X Daily Haydee SalterHobbs, Phillip M, MD   1 application at 07/07/17 0930     Discharge Medications: Please see discharge summary for a list of discharge medications.  Relevant Imaging Results:  Relevant Lab Results:   Additional Information SS# [id number]   Needs IV antibiotics   Antionette PolesKimberly L Wynnie Pacetti, LCSW

## 2017-07-09 DIAGNOSIS — I10 Essential (primary) hypertension: Secondary | ICD-10-CM | POA: Diagnosis not present

## 2017-07-09 DIAGNOSIS — D649 Anemia, unspecified: Secondary | ICD-10-CM | POA: Diagnosis not present

## 2017-07-15 DIAGNOSIS — I10 Essential (primary) hypertension: Secondary | ICD-10-CM | POA: Diagnosis not present

## 2017-07-15 DIAGNOSIS — D649 Anemia, unspecified: Secondary | ICD-10-CM | POA: Diagnosis not present

## 2017-07-28 ENCOUNTER — Other Ambulatory Visit: Payer: Self-pay | Admitting: Nurse Practitioner

## 2017-07-28 ENCOUNTER — Ambulatory Visit
Admission: RE | Admit: 2017-07-28 | Discharge: 2017-07-28 | Disposition: A | Discharge status: Home / Self Care | Payer: Medicare (Managed Care) | Source: Ambulatory Visit | Attending: Nurse Practitioner | Admitting: Nurse Practitioner

## 2017-07-28 DIAGNOSIS — R509 Fever, unspecified: Secondary | ICD-10-CM

## 2017-08-08 ENCOUNTER — Encounter: Payer: Self-pay | Admitting: Family

## 2017-08-08 ENCOUNTER — Ambulatory Visit (HOSPITAL_COMMUNITY)
Admission: RE | Admit: 2017-08-08 | Discharge: 2017-08-08 | Disposition: A | Discharge status: Home / Self Care | Payer: Medicare (Managed Care) | Source: Ambulatory Visit | Attending: Vascular Surgery | Admitting: Vascular Surgery

## 2017-08-08 ENCOUNTER — Other Ambulatory Visit: Payer: Self-pay

## 2017-08-08 ENCOUNTER — Ambulatory Visit (INDEPENDENT_AMBULATORY_CARE_PROVIDER_SITE_OTHER): Payer: Medicare (Managed Care) | Admitting: Family

## 2017-08-08 VITALS — BP 120/48 | HR 89 | Temp 97.0°F | Resp 20 | Ht 72.0 in | Wt 158.0 lb

## 2017-08-08 DIAGNOSIS — I739 Peripheral vascular disease, unspecified: Secondary | ICD-10-CM | POA: Insufficient documentation

## 2017-08-08 DIAGNOSIS — I779 Disorder of arteries and arterioles, unspecified: Secondary | ICD-10-CM

## 2017-08-08 DIAGNOSIS — Z87891 Personal history of nicotine dependence: Secondary | ICD-10-CM | POA: Insufficient documentation

## 2017-08-08 DIAGNOSIS — R0989 Other specified symptoms and signs involving the circulatory and respiratory systems: Secondary | ICD-10-CM | POA: Diagnosis present

## 2017-08-08 DIAGNOSIS — E119 Type 2 diabetes mellitus without complications: Secondary | ICD-10-CM | POA: Diagnosis not present

## 2017-08-08 DIAGNOSIS — E1151 Type 2 diabetes mellitus with diabetic peripheral angiopathy without gangrene: Secondary | ICD-10-CM | POA: Diagnosis not present

## 2017-08-08 DIAGNOSIS — E1165 Type 2 diabetes mellitus with hyperglycemia: Secondary | ICD-10-CM

## 2017-08-08 DIAGNOSIS — Z794 Long term (current) use of insulin: Secondary | ICD-10-CM | POA: Insufficient documentation

## 2017-08-08 DIAGNOSIS — IMO0002 Reserved for concepts with insufficient information to code with codable children: Secondary | ICD-10-CM

## 2017-08-08 NOTE — Progress Notes (Signed)
[facility] [place]   CC: Follow up peripheral artery occlusive disease  History of Present Illness Alan [name]. is a 72 y.o. male who is s/p right femoral endarterectomy 10/30/2016 by Dr. Darrick Penna. 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 He also has a left below-knee amputation that was done at [facility] in the remote past.  Dr. Darrick Penna last evaluated pt on 11-28-16. At that time right groin well-healed no drainage no erythema Right transmetatarsal amputation with 2.0 x 1.5 cm open wound in the central section less than 1 mm diameter 100% granulation tissue. Healing right transmetatarsal amputation wound. Doing well status post right femoral endarterectomy.  The patient was to follow-up with our nurse practitioner in 6 months with repeat ABIs at that point.   He was discharged from the wound care center, healed transmetatarsal wound. [facility] and receives daily physical therapy. His wife is with him, states he has dementia and is very hard of hearting.  Pt is bright and alert, responds appropriately and follows the speaker.  He had a urostomy at some point, this has been removed, has a hx of nephrolithiasis.   Wife sates he "broke his back about 2015" from falling.     Diabetic: Yes, last A1C result on file was 9.5 on 10-27-16, uncontrolled Tobacco use: former smoker, quit October 2017  Pt meds include: Statin :Yes Betablocker: No ASA: Yes Other anticoagulants/antiplatelets: no  Past Medical History:  Diagnosis Date  . Anemia   . Arthritis   . Dementia   . Diabetes mellitus without complication (HCC)   . ETOH abuse   . Myocardial infarction (HCC)   . Peripheral arterial disease (HCC)   . Stroke (HCC)   . Wears dentures   . Wears glasses     Social History Social History   Tobacco Use  . Smoking status: Former Smoker    Types: Cigarettes  . Smokeless tobacco: Never Used  . Tobacco comment:  Quit smoking cigarettes 10/ 2017  Substance Use Topics  . Alcohol use: No    Comment: former  . Drug use: No    Family History Family History  Problem Relation Age of Onset  . Diabetes Mother   . Heart disease Father   . Heart disease Brother     Past Surgical History:  Procedure Laterality Date  . ABDOMINAL AORTOGRAM N/A 10/25/2016   Procedure: Abdominal Aortogram;  Surgeon: Sherren Kerns, MD;  Location: [facility] INVASIVE CV LAB;  Service: Cardiovascular;  Laterality: N/A;  . BRAIN SURGERY    . CORONARY STENT PLACEMENT     " about 25 years ago (10/29/16)  . ENDARTERECTOMY FEMORAL Right 10/30/2016   Procedure: Eduardo Osier WITH PATCH GRAFT;  Surgeon: Sherren Kerns, MD;  Location: [facility] OR;  Service: Vascular;  Laterality: Right;  . IR NEPHRO TUBE REMOV/FL  07/04/2017  . IR NEPHRO TUBE REMOV/FL  07/07/2017  . IR NEPHROSTOMY EXCHANGE LEFT  06/20/2017  . IR NEPHROSTOMY EXCHANGE RIGHT  06/20/2017  . IR NEPHROSTOMY PLACEMENT LEFT  06/12/2017  . IR NEPHROSTOMY PLACEMENT RIGHT  06/12/2017  . IR URETERAL STENT PLACEMENT EXISTING ACCESS LEFT  06/20/2017  . IR URETERAL STENT PLACEMENT EXISTING ACCESS RIGHT  06/20/2017  . LOWER EXTREMITY ANGIOGRAPHY Right 10/25/2016   Procedure: Lower Extremity Angiography;  Surgeon: Sherren Kerns, MD;  Location: [facility] INVASIVE CV LAB;  Service: Cardiovascular;  Laterality: Right;    Allergies  Allergen Reactions  .  Penicillins Rash    Has patient had a PCN reaction causing immediate rash, facial/tongue/throat swelling, SOB or lightheadedness with hypotension: Yes Has patient had a PCN reaction causing severe rash involving mucus membranes or skin necrosis: No Has patient had a PCN reaction that required hospitalization No Has patient had a PCN reaction occurring within the last 10 years: No If all of the above answers are "NO", then may proceed with Cephalosporin use.     Current Outpatient Medications  Medication Sig Dispense Refill  .  acetaminophen (TYLENOL) 650 MG CR tablet Take 650 mg by mouth 3 (three) times daily.     Marland Kitchen alendronate (FOSAMAX) 70 MG tablet Take 70 mg by mouth once a week. Take with a full glass of water on an empty stomach.    Marland Kitchen aspirin EC 81 MG tablet Take 81 mg by mouth daily.    Marland Kitchen atorvastatin (LIPITOR) 10 MG tablet Take 10 mg by mouth every evening.    . ciprofloxacin (CIPRO) 500 MG tablet Take 500 mg by mouth 2 (two) times daily. Last dose on 08/09/2017  0  . insulin glargine (LANTUS) 100 UNIT/ML injection Inject 44 Units into the skin at bedtime.     . lidocaine (LIDODERM) 5 % Place 1 patch onto the skin daily. Remove & Discard patch within 12 hours or as directed by MD    . loperamide (IMODIUM) 2 MG capsule Take 2 capsules (4 mg total) by mouth every 6 (six) hours as needed for diarrhea or loose stools. 30 capsule 0  . LORazepam (ATIVAN) 1 MG tablet Take 0.5 tablets (0.5 mg total) by mouth 2 (two) times daily. 2 tablet 0  . Melatonin 10 MG TABS Take 10 mg by mouth at bedtime.    . metFORMIN (GLUCOPHAGE-XR) 500 MG 24 hr tablet Take 1,000 mg by mouth daily with breakfast.    . polyethylene glycol (MIRALAX / GLYCOLAX) packet Take 17 g by mouth daily. 30 each 0  . sertraline (ZOLOFT) 50 MG tablet Take 50 mg by mouth 2 (two) times daily.     . Skin Protectants, Misc. (EUCERIN) cream Apply 1 application topically daily as needed for dry skin.    . tamsulosin (FLOMAX) 0.4 MG CAPS capsule Take 0.4 mg by mouth every evening.    . traZODone (DESYREL) 50 MG tablet TAKE ONE TABLET (50 MG TOTAL) BY MOUTH AT BEDTIME.  2  . zinc oxide (BALMEX) 11.3 % CREA cream Apply 1 application topically 6 (six) times daily.    . fluticasone (FLONASE) 50 MCG/ACT nasal spray Place 1 spray into the nose daily as needed for rhinitis.      No current facility-administered medications for this visit.     ROS: See HPI for pertinent positives and negatives.   Physical Examination  Vitals:   08/08/17 1132  BP: (!) 120/48   Pulse: 89  Resp: 20  Temp: (!) 97 F (36.1 C)  TempSrc: Oral  SpO2: 99%  Weight: 158 lb (71.7 kg)  Height: 6' (1.829 m)   Body mass index is 21.43 kg/m.  General: A&O x 3, male. Gait: seated in his motorized wheelchair  HENT: No gross abnormalities.  Eyes: PERRLA. Pulmonary: Respirations are non labored, CTAB, fair air movement in all fields Cardiac: regular rhythm, no detected murmur.         Carotid Bruits Right Left   Negative Negative   Radial pulses are 1+ palpable bilaterally   Adominal aortic pulse is not palpable  VASCULAR EXAM: Extremities without ischemic changes, without Gangrene; without open wounds. Healed right TMA, no heel ulcers. Healed left BKA.                                                                                                           LE Pulses Right Left       FEMORAL  not palpable seated in w/c  not palpable        POPLITEAL  2+ palpable   2+ palpable       POSTERIOR TIBIAL  not palpable   BKA        DORSALIS PEDIS      ANTERIOR TIBIAL 1+palpable  BKA    Abdomen: soft, NT, no palpable masses. Skin: no rashes, no cellulitis, no ulcers noted. Musculoskeletal: + muscle wasting and atrophy left UE.   Neurologic: A&O X 3; appropriate affect, Sensation is normal; MOTOR FUNCTION:  moving all extremitiesun equally, motor strength 5/5 at right upper and lower extremities, and left LE, 3/5 in left UE. Speech is fluent/normal. CN 2-12 intact except is very hard of hearing.  Psychiatric: Thought content is normal, mood appropriate for clinical situation.     ASSESSMENT: Alan SeedsGlenn A Hemler Jr. is a 72 y.o. male who is s/p right femoral endarterectomy 10/30/2016 by Dr. Darrick PennaFields. 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 He also has a left below-knee amputation that was done [facility] in the remote past.  Right TMA wound has healed completely, 1+ palpable right DP pulse, 2+ palpable  bilateral popliteal pulses. Left BKA stump is well healed.  Pt is confined to motorized w/c, is in the [facility] program.    DATA  ABI (Date: 08/08/2017):  R:   ABI: 0.83 (was 0.72 on 10-09-16),   PT: mono  DP: mono   TBI:  S/p TMA   L: BKA Improved perfusion of right LE to 83% from 72%.    PLAN:  Based on the patient's vascular studies and examination, pt will return to clinic in 6 months with right ABI.   I discussed in depth with the patient the nature of atherosclerosis, and emphasized the importance of maximal medical management including strict control of blood pressure, blood glucose, and lipid levels, obtaining regular exercise, and continued cessation of smoking.  The patient is aware that without maximal medical management the underlying atherosclerotic disease process will progress, limiting the benefit of any interventions.  The patient was given information about PAD including signs, symptoms, treatment, what symptoms should prompt the patient to seek immediate medical care, and risk reduction measures to take.  Charisse MarchSuzanne Eura Mccauslin, RN, MSN, FNP-C [facility] [place] Office Phone: [phone]  Clinic MD: Randie HeinzCain  08/08/17 11:48 AM

## 2017-08-08 NOTE — Patient Instructions (Signed)

## 2017-08-11 ENCOUNTER — Other Ambulatory Visit (HOSPITAL_COMMUNITY): Payer: Self-pay | Admitting: Internal Medicine

## 2017-08-11 ENCOUNTER — Other Ambulatory Visit: Payer: Self-pay

## 2017-08-11 DIAGNOSIS — I70209 Unspecified atherosclerosis of native arteries of extremities, unspecified extremity: Secondary | ICD-10-CM

## 2017-08-11 DIAGNOSIS — R1310 Dysphagia, unspecified: Secondary | ICD-10-CM

## 2017-08-19 ENCOUNTER — Ambulatory Visit (HOSPITAL_COMMUNITY)
Admission: RE | Admit: 2017-08-19 | Discharge: 2017-08-19 | Disposition: A | Discharge status: Home / Self Care | Payer: Medicare (Managed Care) | Source: Ambulatory Visit | Attending: Internal Medicine | Admitting: Internal Medicine

## 2017-08-19 DIAGNOSIS — R1310 Dysphagia, unspecified: Secondary | ICD-10-CM | POA: Diagnosis present

## 2017-08-19 DIAGNOSIS — R1312 Dysphagia, oropharyngeal phase: Secondary | ICD-10-CM | POA: Insufficient documentation

## 2017-08-19 NOTE — Progress Notes (Signed)
Modified Barium Swallow Progress Note  Patient Details  Name: Alan SeedsGlenn A Bartosiewicz Jr. MRN: [id number] Date of Birth: [birth year]  Today's Date: 08/19/2017  Modified Barium Swallow completed.  Full report located under Chart Review in the Imaging Section.  Brief recommendations include the following:  Clinical Impression    Pt presents with moderate oropharyngeal dysphagia. Impaired timing of pharyngeal swallow indicated by aspiration/penetration before the swallow with nectar thick liquids. Using aspiration precautions and compensatory strategies (slow/small sips, chin tuck, no straw) airway compromise was reduced to shallow penetration with nectar thick liquids and no evidence of penetration and aspiration with thin liquid. Mod verbal and visual cues required for pt to consistently use chin tuck strategy with liquid trials. Pt was not sensate to airway compromise and was able to minimally reduce penetrates (not aspirates) with Mod verbal cues to clear throat/subsequently swallow. Swallow initiation was delayed for solid trials, with pt benefiting from min verbal cues to swallow and additional liquid boluses to facilitate swallow initiation. Given pt's baseline dementia and amount of cuing required to maintain chin tuck, recommend honey thick liquids and chopped solids diet with aspiration precautions (slow/small bites, no straws) with allowance of thin liquid in between meals/after oral care with use of chin tuck to aid in hydration/pt comfort.    Swallow Evaluation Recommendations       SLP Diet Recommendations: Dysphagia 2 (Fine chop) solids;Free water protocol after oral care;Honey thick liquids   Liquid Administration via: No straw   Medication Administration: Whole meds with liquid   Supervision: Full assist for feeding;Full supervision/cueing for compensatory strategies   Compensations: Small sips/bites;Slow rate;Minimize environmental distractions;Other (Comment)(chin tuck with small sips  of thin liquid)   Postural Changes: Seated upright at 90 degrees   Oral Care Recommendations: Oral care BID        Alan [name] 08/19/2017,3:43 PM

## 2017-11-03 ENCOUNTER — Other Ambulatory Visit: Payer: Self-pay | Admitting: Internal Medicine

## 2017-11-03 DIAGNOSIS — N200 Calculus of kidney: Secondary | ICD-10-CM

## 2017-11-10 ENCOUNTER — Ambulatory Visit
Admission: RE | Admit: 2017-11-10 | Discharge: 2017-11-10 | Disposition: A | Discharge status: Home / Self Care | Payer: Medicare (Managed Care) | Source: Ambulatory Visit | Attending: Internal Medicine | Admitting: Internal Medicine

## 2017-11-10 DIAGNOSIS — N200 Calculus of kidney: Secondary | ICD-10-CM

## 2017-11-25 ENCOUNTER — Other Ambulatory Visit: Payer: Self-pay

## 2017-11-25 ENCOUNTER — Emergency Department (HOSPITAL_COMMUNITY): Payer: Medicare (Managed Care)

## 2017-11-25 ENCOUNTER — Encounter (HOSPITAL_COMMUNITY): Payer: Self-pay

## 2017-11-25 ENCOUNTER — Emergency Department (HOSPITAL_COMMUNITY)
Admission: EM | Admit: 2017-11-25 | Discharge: 2017-11-26 | Disposition: A | Discharge status: Home / Self Care | Payer: Medicare (Managed Care) | Attending: Emergency Medicine | Admitting: Emergency Medicine

## 2017-11-25 DIAGNOSIS — F0390 Unspecified dementia without behavioral disturbance: Secondary | ICD-10-CM | POA: Insufficient documentation

## 2017-11-25 DIAGNOSIS — R1084 Generalized abdominal pain: Secondary | ICD-10-CM | POA: Insufficient documentation

## 2017-11-25 DIAGNOSIS — Z794 Long term (current) use of insulin: Secondary | ICD-10-CM | POA: Diagnosis not present

## 2017-11-25 DIAGNOSIS — E1165 Type 2 diabetes mellitus with hyperglycemia: Secondary | ICD-10-CM | POA: Insufficient documentation

## 2017-11-25 DIAGNOSIS — Z87891 Personal history of nicotine dependence: Secondary | ICD-10-CM | POA: Diagnosis not present

## 2017-11-25 DIAGNOSIS — I252 Old myocardial infarction: Secondary | ICD-10-CM | POA: Insufficient documentation

## 2017-11-25 DIAGNOSIS — Z7982 Long term (current) use of aspirin: Secondary | ICD-10-CM | POA: Insufficient documentation

## 2017-11-25 DIAGNOSIS — R739 Hyperglycemia, unspecified: Secondary | ICD-10-CM

## 2017-11-25 DIAGNOSIS — Z8673 Personal history of transient ischemic attack (TIA), and cerebral infarction without residual deficits: Secondary | ICD-10-CM | POA: Insufficient documentation

## 2017-11-25 DIAGNOSIS — Z79899 Other long term (current) drug therapy: Secondary | ICD-10-CM | POA: Insufficient documentation

## 2017-11-25 LAB — BASIC METABOLIC PANEL
Anion gap: 12 (ref 5–15)
BUN: 20 mg/dL (ref 6–20)
CHLORIDE: 94 mmol/L — AB (ref 101–111)
CO2: 29 mmol/L (ref 22–32)
CREATININE: 0.89 mg/dL (ref 0.61–1.24)
Calcium: 9.6 mg/dL (ref 8.9–10.3)
GFR calc Af Amer: >60 mL/min (ref >60)
GFR calc non Af Amer: >60 mL/min (ref >60)
Glucose, Bld: 531 mg/dL (ref 65–99)
POTASSIUM: 4.5 mmol/L (ref 3.5–5.1)
Sodium: 135 mmol/L (ref 135–145)

## 2017-11-25 LAB — CBC
HEMATOCRIT: 36.3 % — AB (ref 39.0–52.0)
Hemoglobin: 11.8 g/dL — ABNORMAL LOW (ref 13.0–17.0)
MCH: 28.9 pg (ref 26.0–34.0)
MCHC: 32.5 g/dL (ref 30.0–36.0)
MCV: 89 fL (ref 78.0–100.0)
PLATELETS: 208 10*3/uL (ref 150–400)
RBC: 4.08 MIL/uL — AB (ref 4.22–5.81)
RDW: 13.5 % (ref 11.5–15.5)
WBC: 8.4 10*3/uL (ref 4.0–10.5)

## 2017-11-25 LAB — CBG MONITORING, ED
Glucose-Capillary: 527 mg/dL (ref 65–99)
Glucose-Capillary: 427 mg/dL — ABNORMAL HIGH (ref 65–99)

## 2017-11-25 MED ORDER — INSULIN ASPART 100 UNIT/ML ~~LOC~~ SOLN
10.0000 [IU] | Freq: Once | SUBCUTANEOUS | Status: AC
  Administered 2017-11-25: 10 [IU] via SUBCUTANEOUS
  Filled 2017-11-25: qty 1

## 2017-11-25 MED ORDER — LACTATED RINGERS IV BOLUS
1000.0000 mL | Freq: Once | INTRAVENOUS | Status: AC
  Administered 2017-11-26: 1000 mL via INTRAVENOUS

## 2017-11-25 MED ORDER — INSULIN GLARGINE 100 UNIT/ML ~~LOC~~ SOLN
50.0000 [IU] | Freq: Once | SUBCUTANEOUS | Status: AC
  Administered 2017-11-26: 50 [IU] via SUBCUTANEOUS
  Filled 2017-11-25: qty 0.5

## 2017-11-25 MED ORDER — SODIUM CHLORIDE 0.9 % IV BOLUS
1000.0000 mL | Freq: Once | INTRAVENOUS | Status: AC
  Administered 2017-11-25: 1000 mL via INTRAVENOUS

## 2017-11-25 NOTE — ED Provider Notes (Signed)
Emergency Department Provider Note   I have reviewed the triage vital signs and the nursing notes.   HISTORY  Chief Complaint Hyperglycemia   HPI Alan [name]. is a 72 y.o. male with history of diabetes on insulin and metformin also with dementia whose wife gives the history.  Patient apparently has been a bit more sleepy and confused over the last couple days.  He was recently admitted to the hospital for sepsis a couple times along with bilateral ureteral stents and nephrostomy tubes.  This was back in January when he is discharged to last time.  He also had fungemia while he is in the hospital per the records.  Apparently patient had been doing well but last couple days he was a bit more sleepy and this morning he was also little bit more confused and just not having energy used to.  [facility] but after returning from there he was even more altered so she checked the blood sugar thinking that it was good to be low and it was actually 580.  At that time he elevates shaking and confusion so EMS was called brought him here for further evaluation.  Patient denies any issues however the patient's wife states that he has had a bit of a cough but nothing severe.  He has been complaining chest pain but did have abdominal discomfort and multiple episodes of watery foul-smelling diarrhea.  She also states he is currently on antibiotics for urinary tract infection.  She is unsure what he is on before but thinks it was Keflex at a low dose the bottle she has now is Augmentin. No other associated or modifying symptoms.    Past Medical History:  Diagnosis Date  . Anemia   . Arthritis   . Dementia   . Diabetes mellitus without complication (HCC)   . ETOH abuse   . Myocardial infarction (HCC)   . Peripheral arterial disease (HCC)   . Stroke (HCC)   . Wears dentures   . Wears glasses     Patient Active Problem List   Diagnosis Date Noted  . Urinary tract infection due  to ESBL Klebsiella 07/07/2017  . Urinary tract infection associated with nephrostomy catheter (HCC) 07/07/2017  . Acute urinary retention 07/07/2017  . Acute metabolic encephalopathy 07/07/2017  . Nephrostomy status (HCC)   . Ureteral calculus, left   . Urinary tract infection due to Klebsiella species   . Sepsis (HCC) 07/02/2017  . Candida tropicalis infection 06/20/2017  . Bilateral nephrolithiasis 06/20/2017  . History of nephrostomy   . Candidemia (HCC) 06/15/2017  . Obstruction of kidney   . 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 ABDOMINAL AORTOGRAM N/A 10/25/2016   Procedure: Abdominal Aortogram;  Surgeon: Sherren Kerns, MD;  Location: [facility] INVASIVE CV LAB;  Service: Cardiovascular;  Laterality: N/A;  . BRAIN SURGERY    . CORONARY STENT PLACEMENT     " about 25 years ago (10/29/16)  . ENDARTERECTOMY FEMORAL Right 10/30/2016   Procedure: Eduardo Osier WITH PATCH GRAFT;  Surgeon: Sherren Kerns, MD;  Location: [facility] OR;  Service: Vascular;  Laterality: Right;  . IR NEPHRO TUBE REMOV/FL  07/04/2017  . IR NEPHRO TUBE REMOV/FL  07/07/2017  . IR NEPHROSTOMY EXCHANGE LEFT  06/20/2017  . IR NEPHROSTOMY EXCHANGE RIGHT  06/20/2017  . IR NEPHROSTOMY PLACEMENT LEFT  06/12/2017  . IR NEPHROSTOMY PLACEMENT RIGHT  06/12/2017  . IR URETERAL STENT PLACEMENT EXISTING ACCESS LEFT  06/20/2017  . IR URETERAL STENT PLACEMENT EXISTING ACCESS RIGHT  06/20/2017  . LOWER EXTREMITY ANGIOGRAPHY Right 10/25/2016   Procedure: Lower Extremity Angiography;  Surgeon: Sherren Kerns, MD;  Location: [facility] INVASIVE CV LAB;  Service: Cardiovascular;  Laterality: Right;    Current Outpatient Rx  . Order #: [id number] Class: Historical Med  . Order #: [id number] Class: Historical Med  . Order #: [id number] Class: Historical Med  . Order #: [id number] Class: Historical Med    . Order #: [id number] Class: Historical Med  . Order #: [id number] Class: Historical Med  . Order #: [id number] Class: Historical Med  . Order #: [id number] Class: Historical Med  . Order #: [id number] Class: Print  . Order #: [id number] Class: Print  . Order #: [id number] Class: Historical Med  . Order #: [id number] Class: Historical Med  . Order #: [id number] Class: Print  . Order #: [id number] Class: Historical Med  . Order #: [id number] Class: Historical Med  . Order #: [id number] Class: Historical Med  . Order #: [id number] Class: Historical Med  . Order #: [id number] Class: Historical Med    Allergies Penicillins  Family History  Problem Relation Age of Onset  . Diabetes Mother   . Heart disease Father   . Heart disease Brother     Social History Social History   Tobacco Use  . Smoking status: Former Smoker    Types: Cigarettes  . Smokeless tobacco: Never Used  . Tobacco comment: Quit smoking cigarettes 10/ 2017  Substance Use Topics  . Alcohol use: No    Comment: former  . Drug use: No    Review of Systems  All other systems negative except as documented in the HPI. All pertinent positives and negatives as reviewed in the HPI. ____________________________________________   PHYSICAL EXAM:  VITAL SIGNS: ED Triage Vitals  Enc Vitals Group     BP 11/25/17 1956 (!) 151/92     Pulse Rate 11/25/17 1956 91     Resp 11/25/17 1956 20     Temp 11/25/17 1956 98.7 F (37.1 C)     Temp Source 11/25/17 1956 Oral     SpO2 11/25/17 1951 97 %     Weight 11/25/17 1954 158 lb (71.7 kg)     Height 11/25/17 1954 6\' 1"  (1.854 m)    Constitutional: Alert. Well appearing and in no acute distress. Eyes: Conjunctivae are normal. PERRL. EOMI. Head: Atraumatic. Nose: No congestion/rhinnorhea. Mouth/Throat: Mucous membranes are moist.  Oropharynx non-erythematous. Neck: No stridor.  No meningeal signs.   Cardiovascular: Normal rate, regular rhythm. Good peripheral circulation. Grossly normal heart  sounds.   Respiratory: Normal respiratory effort.  No retractions. Lungs CTAB. Gastrointestinal: Soft and nontender. No distention.  Musculoskeletal: No lower extremity tenderness nor edema. Left bka. Neurologic:  Normal speech and language. No gross focal neurologic deficits are appreciated.  Skin:  Skin is warm, dry and intact. No rash noted.  ____________________________________________   LABS (all labs ordered are listed, but only abnormal results are displayed)  Labs Reviewed  CBG MONITORING, ED - Abnormal; Notable for the following components:      Result Value   Glucose-Capillary 527 (*)    All other components within normal limits  BASIC METABOLIC PANEL  CBC  URINALYSIS, ROUTINE W REFLEX MICROSCOPIC  CBG MONITORING, ED   ____________________________________________  EKG   EKG Interpretation  Date/Time:    Ventricular Rate:    PR Interval:    QRS Duration:   QT  Interval:    QTC Calculation:   R Axis:     Text Interpretation:         ____________________________________________  RADIOLOGY  No results found.  ____________________________________________   PROCEDURES  Procedure(s) performed:   Procedures   ____________________________________________   INITIAL IMPRESSION / ASSESSMENT AND PLAN / ED COURSE  Evaluate for causes of hyperglycemia.wife states he only gets elevated sugar when he has an infection, but no fever or leukocytosis here. Unsure he has one. Currently on abx for uti? Will recheck. Also with diarrhea will acquire stool sample.   Care transferred pending UA. Blood sugar improving. No other e/o infection. Suspect likely discharge.      Pertinent labs & imaging results that were available during my care of the patient were reviewed by me and considered in my medical decision making (see chart for details).  ____________________________________________  FINAL CLINICAL IMPRESSION(S) / ED DIAGNOSES  Final diagnoses:  None      MEDICATIONS GIVEN DURING THIS VISIT:  Medications - No data to display   NEW OUTPATIENT MEDICATIONS STARTED DURING THIS VISIT:  New Prescriptions   No medications on file    Note:  This note was prepared with assistance of Dragon voice recognition software. Occasional wrong-word or sound-a-like substitutions may have occurred due to the inherent limitations of voice recognition software.   Damacio Weisgerber, Barbara CowerJason, MD 11/26/17 [phone]

## 2017-11-25 NOTE — ED Notes (Signed)
CRITICAL VALUE ALERT  Critical Value:  Glucose 531 mg/dl  Date & Time Notied:  40/03/2705/04/19 & 2047 hrs  Provider Notified: Dr. Clayborne DanaMesner  Orders Received/Actions taken: N/A

## 2017-11-25 NOTE — ED Triage Notes (Addendum)
EMS called out for high blood sugar. Family told EMS that it has been going up all day. EMS report CBG of 557. Vitals WNL per EMS

## 2017-11-26 LAB — URINALYSIS, ROUTINE W REFLEX MICROSCOPIC
Bacteria, UA: NONE SEEN
Bilirubin Urine: NEGATIVE
Glucose, UA: >=500 mg/dL — AB
HGB URINE DIPSTICK: NEGATIVE
KETONES UR: 5 mg/dL — AB
Nitrite: NEGATIVE
PROTEIN: NEGATIVE mg/dL
Specific Gravity, Urine: 1.026 (ref 1.005–1.030)
pH: 7 (ref 5.0–8.0)

## 2017-11-26 LAB — CBG MONITORING, ED: GLUCOSE-CAPILLARY: 304 mg/dL — AB (ref 65–99)

## 2017-11-26 NOTE — ED Provider Notes (Signed)
1:59 AM Patient signed out pending urinalysis.  Urinalysis is largely unremarkable.  He does have 21-50 white cells but no bacteria present.  Given that he is afebrile and otherwise nontoxic-appearing.  Urine culture was sent.  Would not elect to treat at this time.  Recommend taking insulin and staying hydrated.  Follow-up with primary physician.   Shon BatonHorton, Suheyla Mortellaro F, MD 11/26/17 0200

## 2017-11-26 NOTE — Discharge Instructions (Addendum)
Take your medications and insulin as prescribed.  Make sure that you are staying hydrated.  Your urinalysis today does not appear infected.  Urine culture is pending.  If you develop fevers or any new or worsening symptoms you should be reevaluated.

## 2017-11-27 LAB — URINE CULTURE

## 2017-12-26 ENCOUNTER — Emergency Department (HOSPITAL_COMMUNITY): Payer: Medicare (Managed Care)

## 2017-12-26 ENCOUNTER — Inpatient Hospital Stay (HOSPITAL_COMMUNITY)
Admission: EM | Admit: 2017-12-26 | Discharge: 2017-12-29 | DRG: 871 | Disposition: A | Discharge status: Skilled Nursing Facility | Payer: Medicare (Managed Care) | Attending: Internal Medicine | Admitting: Internal Medicine

## 2017-12-26 ENCOUNTER — Encounter (HOSPITAL_COMMUNITY): Payer: Self-pay

## 2017-12-26 ENCOUNTER — Other Ambulatory Visit: Payer: Self-pay

## 2017-12-26 DIAGNOSIS — F0390 Unspecified dementia without behavioral disturbance: Secondary | ICD-10-CM | POA: Diagnosis present

## 2017-12-26 DIAGNOSIS — Z87891 Personal history of nicotine dependence: Secondary | ICD-10-CM | POA: Diagnosis not present

## 2017-12-26 DIAGNOSIS — R1310 Dysphagia, unspecified: Secondary | ICD-10-CM | POA: Diagnosis present

## 2017-12-26 DIAGNOSIS — Z1612 Extended spectrum beta lactamase (ESBL) resistance: Secondary | ICD-10-CM | POA: Diagnosis present

## 2017-12-26 DIAGNOSIS — G9341 Metabolic encephalopathy: Secondary | ICD-10-CM | POA: Diagnosis present

## 2017-12-26 DIAGNOSIS — E876 Hypokalemia: Secondary | ICD-10-CM | POA: Diagnosis present

## 2017-12-26 DIAGNOSIS — N390 Urinary tract infection, site not specified: Secondary | ICD-10-CM | POA: Diagnosis present

## 2017-12-26 DIAGNOSIS — Z880 Allergy status to penicillin: Secondary | ICD-10-CM | POA: Diagnosis not present

## 2017-12-26 DIAGNOSIS — B961 Klebsiella pneumoniae [K. pneumoniae] as the cause of diseases classified elsewhere: Secondary | ICD-10-CM | POA: Diagnosis present

## 2017-12-26 DIAGNOSIS — A4159 Other Gram-negative sepsis: Secondary | ICD-10-CM | POA: Diagnosis not present

## 2017-12-26 DIAGNOSIS — E1165 Type 2 diabetes mellitus with hyperglycemia: Secondary | ICD-10-CM

## 2017-12-26 DIAGNOSIS — E1151 Type 2 diabetes mellitus with diabetic peripheral angiopathy without gangrene: Secondary | ICD-10-CM | POA: Diagnosis present

## 2017-12-26 DIAGNOSIS — Z7982 Long term (current) use of aspirin: Secondary | ICD-10-CM

## 2017-12-26 DIAGNOSIS — E1110 Type 2 diabetes mellitus with ketoacidosis without coma: Secondary | ICD-10-CM | POA: Diagnosis present

## 2017-12-26 DIAGNOSIS — A419 Sepsis, unspecified organism: Secondary | ICD-10-CM | POA: Diagnosis not present

## 2017-12-26 DIAGNOSIS — Z8673 Personal history of transient ischemic attack (TIA), and cerebral infarction without residual deficits: Secondary | ICD-10-CM | POA: Diagnosis not present

## 2017-12-26 DIAGNOSIS — B9689 Other specified bacterial agents as the cause of diseases classified elsewhere: Secondary | ICD-10-CM | POA: Diagnosis present

## 2017-12-26 DIAGNOSIS — A414 Sepsis due to anaerobes: Secondary | ICD-10-CM | POA: Diagnosis not present

## 2017-12-26 DIAGNOSIS — I2510 Atherosclerotic heart disease of native coronary artery without angina pectoris: Secondary | ICD-10-CM | POA: Diagnosis present

## 2017-12-26 DIAGNOSIS — R4182 Altered mental status, unspecified: Secondary | ICD-10-CM

## 2017-12-26 DIAGNOSIS — Z79899 Other long term (current) drug therapy: Secondary | ICD-10-CM

## 2017-12-26 DIAGNOSIS — I252 Old myocardial infarction: Secondary | ICD-10-CM | POA: Diagnosis not present

## 2017-12-26 DIAGNOSIS — Z794 Long term (current) use of insulin: Secondary | ICD-10-CM

## 2017-12-26 DIAGNOSIS — E1111 Type 2 diabetes mellitus with ketoacidosis with coma: Secondary | ICD-10-CM | POA: Diagnosis not present

## 2017-12-26 DIAGNOSIS — Z66 Do not resuscitate: Secondary | ICD-10-CM | POA: Diagnosis present

## 2017-12-26 LAB — COMPREHENSIVE METABOLIC PANEL
ALT: 18 U/L (ref 0–44)
AST: 14 U/L — ABNORMAL LOW (ref 15–41)
Albumin: 3.9 g/dL (ref 3.5–5.0)
Alkaline Phosphatase: 65 U/L (ref 38–126)
Anion gap: 21 — ABNORMAL HIGH (ref 5–15)
BUN: 32 mg/dL — ABNORMAL HIGH (ref 8–23)
CO2: 19 mmol/L — ABNORMAL LOW (ref 22–32)
Calcium: 8.9 mg/dL (ref 8.9–10.3)
Chloride: 105 mmol/L (ref 98–111)
Creatinine, Ser: 1.07 mg/dL (ref 0.61–1.24)
GFR calc Af Amer: >60 mL/min (ref >60)
GFR calc non Af Amer: >60 mL/min (ref >60)
Glucose, Bld: 499 mg/dL — ABNORMAL HIGH (ref 70–99)
Potassium: 4.3 mmol/L (ref 3.5–5.1)
Sodium: 145 mmol/L (ref 135–145)
Total Bilirubin: 1.4 mg/dL — ABNORMAL HIGH (ref 0.3–1.2)
Total Protein: 8.4 g/dL — ABNORMAL HIGH (ref 6.5–8.1)

## 2017-12-26 LAB — BASIC METABOLIC PANEL
ANION GAP: 13 (ref 5–15)
ANION GAP: 8 (ref 5–15)
BUN: 30 mg/dL — ABNORMAL HIGH (ref 8–23)
BUN: 27 mg/dL — ABNORMAL HIGH (ref 8–23)
CALCIUM: 8.2 mg/dL — AB (ref 8.9–10.3)
CALCIUM: 7.8 mg/dL — AB (ref 8.9–10.3)
CO2: 22 mmol/L (ref 22–32)
CO2: 24 mmol/L (ref 22–32)
CREATININE: 0.88 mg/dL (ref 0.61–1.24)
CREATININE: 0.7 mg/dL (ref 0.61–1.24)
Chloride: 114 mmol/L — ABNORMAL HIGH (ref 98–111)
Chloride: 116 mmol/L — ABNORMAL HIGH (ref 98–111)
Glucose, Bld: 303 mg/dL — ABNORMAL HIGH (ref 70–99)
Glucose, Bld: 199 mg/dL — ABNORMAL HIGH (ref 70–99)
Potassium: 3.3 mmol/L — ABNORMAL LOW (ref 3.5–5.1)
Potassium: 2.8 mmol/L — ABNORMAL LOW (ref 3.5–5.1)
SODIUM: 149 mmol/L — AB (ref 135–145)
SODIUM: 148 mmol/L — AB (ref 135–145)

## 2017-12-26 LAB — CBC WITH DIFFERENTIAL/PLATELET
Basophils Absolute: 0 10*3/uL (ref 0.0–0.1)
Basophils Relative: 0 %
Eosinophils Absolute: 0 10*3/uL (ref 0.0–0.7)
Eosinophils Relative: 0 %
HCT: 39 % (ref 39.0–52.0)
Hemoglobin: 12.2 g/dL — ABNORMAL LOW (ref 13.0–17.0)
Lymphocytes Relative: 17 %
Lymphs Abs: 1.8 10*3/uL (ref 0.7–4.0)
MCH: 28.9 pg (ref 26.0–34.0)
MCHC: 31.3 g/dL (ref 30.0–36.0)
MCV: 92.4 fL (ref 78.0–100.0)
Monocytes Absolute: 0.4 10*3/uL (ref 0.1–1.0)
Monocytes Relative: 4 %
Neutro Abs: 8.6 10*3/uL — ABNORMAL HIGH (ref 1.7–7.7)
Neutrophils Relative %: 79 %
Platelets: 361 10*3/uL (ref 150–400)
RBC: 4.22 MIL/uL (ref 4.22–5.81)
RDW: 13.1 % (ref 11.5–15.5)
WBC: 10.8 10*3/uL — ABNORMAL HIGH (ref 4.0–10.5)

## 2017-12-26 LAB — GLUCOSE, CAPILLARY
GLUCOSE-CAPILLARY: 197 mg/dL — AB (ref 70–99)
GLUCOSE-CAPILLARY: 167 mg/dL — AB (ref 70–99)
Glucose-Capillary: 176 mg/dL — ABNORMAL HIGH (ref 70–99)
Glucose-Capillary: 227 mg/dL — ABNORMAL HIGH (ref 70–99)
Glucose-Capillary: 255 mg/dL — ABNORMAL HIGH (ref 70–99)
Glucose-Capillary: 295 mg/dL — ABNORMAL HIGH (ref 70–99)
Glucose-Capillary: 317 mg/dL — ABNORMAL HIGH (ref 70–99)

## 2017-12-26 LAB — HEMOGLOBIN A1C
HEMOGLOBIN A1C: 11.1 % — AB (ref 4.8–5.6)
Mean Plasma Glucose: 271.87 mg/dL

## 2017-12-26 LAB — URINALYSIS, ROUTINE W REFLEX MICROSCOPIC
Bilirubin Urine: NEGATIVE
Glucose, UA: >=500 mg/dL — AB
Hgb urine dipstick: NEGATIVE
Ketones, ur: 80 mg/dL — AB
Leukocytes, UA: NEGATIVE
Nitrite: NEGATIVE
Protein, ur: 100 mg/dL — AB
Specific Gravity, Urine: 1.028 (ref 1.005–1.030)
pH: 5 (ref 5.0–8.0)

## 2017-12-26 LAB — LACTIC ACID, PLASMA
LACTIC ACID, VENOUS: 1.7 mmol/L (ref 0.5–1.9)
Lactic Acid, Venous: 1.4 mmol/L (ref 0.5–1.9)

## 2017-12-26 LAB — CBG MONITORING, ED
Glucose-Capillary: 420 mg/dL — ABNORMAL HIGH (ref 70–99)
Glucose-Capillary: 432 mg/dL — ABNORMAL HIGH (ref 70–99)
Glucose-Capillary: 370 mg/dL — ABNORMAL HIGH (ref 70–99)

## 2017-12-26 LAB — MAGNESIUM: MAGNESIUM: 2.1 mg/dL (ref 1.7–2.4)

## 2017-12-26 LAB — PROTIME-INR
INR: 1.1
Prothrombin Time: 14.1 seconds (ref 11.4–15.2)

## 2017-12-26 LAB — PROCALCITONIN

## 2017-12-26 MED ORDER — SODIUM CHLORIDE 0.9 % IV SOLN
INTRAVENOUS | Status: AC
  Administered 2017-12-26: 18:00:00 via INTRAVENOUS

## 2017-12-26 MED ORDER — ACETAMINOPHEN 650 MG RE SUPP
650.0000 mg | Freq: Once | RECTAL | Status: AC
  Administered 2017-12-26: 650 mg via RECTAL
  Filled 2017-12-26: qty 1

## 2017-12-26 MED ORDER — SODIUM CHLORIDE 0.9 % IV SOLN
INTRAVENOUS | Status: DC
  Filled 2017-12-26: qty 1

## 2017-12-26 MED ORDER — SODIUM CHLORIDE 0.9 % IV SOLN: 500.0000 mg | Freq: Three times a day (TID) | INTRAVENOUS | Status: DC

## 2017-12-26 MED ORDER — DEXTROSE-NACL 5-0.45 % IV SOLN
INTRAVENOUS | Status: DC
  Administered 2017-12-26 – 2017-12-27 (×2): via INTRAVENOUS

## 2017-12-26 MED ORDER — VANCOMYCIN HCL IN DEXTROSE 750-5 MG/150ML-% IV SOLN
750.0000 mg | Freq: Two times a day (BID) | INTRAVENOUS | Status: DC
  Administered 2017-12-27 – 2017-12-28 (×3): 750 mg via INTRAVENOUS
  Filled 2017-12-26 (×5): qty 150

## 2017-12-26 MED ORDER — SODIUM CHLORIDE 0.9 % IV SOLN
1.0000 g | Freq: Once | INTRAVENOUS | Status: AC
  Administered 2017-12-26: 1 g via INTRAVENOUS
  Filled 2017-12-26: qty 1

## 2017-12-26 MED ORDER — ASPIRIN EC 81 MG PO TBEC
81.0000 mg | DELAYED_RELEASE_TABLET | Freq: Every morning | ORAL | Status: DC
  Administered 2017-12-28 – 2017-12-29 (×2): 81 mg via ORAL
  Filled 2017-12-26 (×2): qty 1

## 2017-12-26 MED ORDER — SERTRALINE HCL 50 MG PO TABS
50.0000 mg | ORAL_TABLET | Freq: Two times a day (BID) | ORAL | Status: DC
  Administered 2017-12-27 – 2017-12-29 (×4): 50 mg via ORAL
  Filled 2017-12-26 (×4): qty 1

## 2017-12-26 MED ORDER — ENOXAPARIN SODIUM 40 MG/0.4ML ~~LOC~~ SOLN
40.0000 mg | SUBCUTANEOUS | Status: DC
  Administered 2017-12-26 – 2017-12-28 (×3): 40 mg via SUBCUTANEOUS
  Filled 2017-12-26 (×3): qty 0.4

## 2017-12-26 MED ORDER — MEROPENEM 1 G IV SOLR
1.0000 g | Freq: Three times a day (TID) | INTRAVENOUS | Status: DC
  Administered 2017-12-26 – 2017-12-29 (×9): 1 g via INTRAVENOUS
  Filled 2017-12-26: qty 3
  Filled 2017-12-26 (×17): qty 1

## 2017-12-26 MED ORDER — SODIUM CHLORIDE 0.9 % IV SOLN
INTRAVENOUS | Status: DC
  Administered 2017-12-26: 3.7 [IU]/h via INTRAVENOUS
  Administered 2017-12-27: 5.3 [IU]/h via INTRAVENOUS
  Filled 2017-12-26 (×2): qty 1

## 2017-12-26 MED ORDER — VANCOMYCIN HCL 10 G IV SOLR
1500.0000 mg | Freq: Once | INTRAVENOUS | Status: AC
  Administered 2017-12-26: 1500 mg via INTRAVENOUS
  Filled 2017-12-26: qty 1500

## 2017-12-26 MED ORDER — DEXTROSE-NACL 5-0.45 % IV SOLN: INTRAVENOUS | Status: DC

## 2017-12-26 MED ORDER — POTASSIUM CHLORIDE 10 MEQ/100ML IV SOLN
10.0000 meq | INTRAVENOUS | Status: AC
  Administered 2017-12-26 (×2): 10 meq via INTRAVENOUS
  Filled 2017-12-26 (×2): qty 100

## 2017-12-26 MED ORDER — SODIUM CHLORIDE 0.9 % IV BOLUS
30.0000 mL/kg | Freq: Once | INTRAVENOUS | Status: AC
  Administered 2017-12-26: 2151 mL via INTRAVENOUS

## 2017-12-26 MED ORDER — ATORVASTATIN CALCIUM 10 MG PO TABS
10.0000 mg | ORAL_TABLET | Freq: Every day | ORAL | Status: DC
  Administered 2017-12-27 – 2017-12-28 (×2): 10 mg via ORAL
  Filled 2017-12-26 (×2): qty 1

## 2017-12-26 MED ORDER — TAMSULOSIN HCL 0.4 MG PO CAPS
0.4000 mg | ORAL_CAPSULE | Freq: Every evening | ORAL | Status: DC
  Administered 2017-12-27 – 2017-12-28 (×2): 0.4 mg via ORAL
  Filled 2017-12-26 (×2): qty 1

## 2017-12-26 MED ORDER — SODIUM CHLORIDE 0.9 % IV SOLN: INTRAVENOUS | Status: DC

## 2017-12-26 NOTE — ED Triage Notes (Addendum)
Pt brought from home via EMS. Pt decreased level of consciousness, fever tachypnic,. Pt has been recently treated for UTI and has completed abt. Pt recently pulled foley out. Given 1 gram rocephin 1 gram IM and receiving 2nd bag of 500 bolus. Pt has not been eating or drinking. Lives with family. VS enroute 94% 111, 30, 102.9A   CBG 504

## 2017-12-26 NOTE — ED Notes (Signed)
Patient chocked on one sip of water. Fluids taken away for further evaluation.

## 2017-12-26 NOTE — ED Provider Notes (Signed)
[facility] EMERGENCY DEPARTMENT Provider Note   CSN: [id number] Arrival date & time: 12/26/17  1023     History   Chief Complaint Chief Complaint  Patient presents with  . Code Sepsis    HPI Alan [name]. is a 72 y.o. male.  HPI   Patient presents for evaluation of decreased responsiveness, and suspected UTI.  He lives at home with his wife who is his caregiver.  He has a DNR and a MOST form.  This form states that he can have symptomatic and limited treatments.  The patient is unable to give any history.  He was transported by EMS, who treated him with IM Rocephin, and IV fluid boluses.  Additional history by wife after her arrival, patient had a Foley catheter which he pulled out, with the balloon intact, 4 days ago.  He saw the urologist the next day who did not make any interventions, and wanted to "watch it."  Patient has had intermittent urinary tract infections over the last several weeks.  His wife cannot recall what treatments were done.  Level 5 caveat-altered mental status  Past Medical History:  Diagnosis Date  . Anemia   . Arthritis   . Dementia   . Diabetes mellitus without complication (HCC)   . ETOH abuse   . Myocardial infarction (HCC)   . Peripheral arterial disease (HCC)   . Stroke (HCC)   . Wears dentures   . Wears glasses     Patient Active Problem List   Diagnosis Date Noted  . 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 Urinary tract infection associated with nephrostomy catheter (HCC) 07/07/2017  . Acute urinary retention 07/07/2017  . Acute metabolic encephalopathy 07/07/2017  . Nephrostomy status (HCC)   . Ureteral calculus, left   . Urinary tract infection due to Klebsiella species   . Sepsis (HCC) 07/02/2017  . Candida tropicalis infection 06/20/2017  . Bilateral nephrolithiasis 06/20/2017  . History of nephrostomy   . Candidemia (HCC) 06/15/2017  . Obstruction of kidney   . Sepsis due to coagulase-negative  staphylococcal infection (HCC) 06/11/2017  . 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 ABDOMINAL AORTOGRAM N/A 10/25/2016   Procedure: Abdominal Aortogram;  Surgeon: Sherren Kerns, MD;  [facility] INVASIVE CV LAB;  Service: Cardiovascular;  Laterality: N/A;  . BRAIN SURGERY    . CORONARY STENT PLACEMENT     " about 25 years ago (10/29/16)  . ENDARTERECTOMY FEMORAL Right 10/30/2016   Procedure: Eduardo Osier WITH PATCH GRAFT;  Surgeon: Sherren Kerns, MD;  Location: [facility] OR;  Service: Vascular;  Laterality: Right;  . IR NEPHRO TUBE REMOV/FL  07/04/2017  . IR NEPHRO TUBE REMOV/FL  07/07/2017  . IR NEPHROSTOMY EXCHANGE LEFT  06/20/2017  . IR NEPHROSTOMY EXCHANGE RIGHT  06/20/2017  . IR NEPHROSTOMY PLACEMENT LEFT  06/12/2017  . IR NEPHROSTOMY PLACEMENT RIGHT  06/12/2017  . IR URETERAL STENT PLACEMENT EXISTING ACCESS LEFT  06/20/2017  . IR URETERAL STENT PLACEMENT EXISTING ACCESS RIGHT  06/20/2017  . LOWER EXTREMITY ANGIOGRAPHY Right 10/25/2016   Procedure: Lower Extremity Angiography;  Surgeon: Sherren Kerns, MD;  Location: [facility] INVASIVE CV LAB;  Service: Cardiovascular;  Laterality: Right;        Home Medications    Prior to Admission medications   Medication Sig Start Date End Date Taking? Authorizing Provider  acetaminophen (TYLENOL) 650 MG CR tablet Take 650 mg by mouth  3 (three) times daily.    Yes [provider]  alendronate (FOSAMAX) 70 MG tablet Take 70 mg by mouth once a week. Take with a full glass of water on an empty stomach.   Yes [provider]  aspirin EC 81 MG tablet Take 81 mg by mouth every morning.    Yes [provider]  atorvastatin (LIPITOR) 10 MG tablet Take 10 mg by mouth at bedtime.    Yes [provider]  Bacillus Coagulans-Inulin (PROBIOTIC FORMULA PO) Take 1 capsule by mouth daily.   Yes [provider]  insulin glargine (LANTUS) 100 UNIT/ML  injection Inject 50 Units into the skin at bedtime.  06/11/16  Yes [provider]  LORazepam (ATIVAN) 1 MG tablet Take 0.5 tablets (0.5 mg total) by mouth 2 (two) times daily. 07/07/17  Yes Short, Thea Silversmith, MD  Melatonin 10 MG TABS Take 10 mg by mouth at bedtime.   Yes [provider]  metFORMIN (GLUCOPHAGE) 500 MG tablet Take 1,000 mg by mouth 2 (two) times daily with a meal.   Yes [provider]  sertraline (ZOLOFT) 50 MG tablet Take 50 mg by mouth 2 (two) times daily.    Yes [provider]  tamsulosin (FLOMAX) 0.4 MG CAPS capsule Take 0.4 mg by mouth every evening.   Yes [provider]  traZODone (DESYREL) 50 MG tablet TAKE ONE TABLET (50 MG TOTAL) BY MOUTH AT BEDTIME. 09/11/16  Yes [provider]  Vitamin D, Ergocalciferol, (DRISDOL) 50000 units CAPS capsule Take 50,000 Units by mouth every 30 (thirty) days.   Yes [provider]  amoxicillin-clavulanate (AUGMENTIN) 875-125 MG tablet Take 1 tablet by mouth 2 (two) times daily. 7 day course starting on 11/21/2017    [provider]    Family History Family History  Problem Relation Age of Onset  . Diabetes Mother   . Heart disease Father   . Heart disease Brother     Social History Social History   Tobacco Use  . Smoking status: Former Smoker    Types: Cigarettes  . Smokeless tobacco: Never Used  . Tobacco comment: Quit smoking cigarettes 10/ 2017  Substance Use Topics  . Alcohol use: No    Comment: former  . Drug use: No     Allergies   Penicillins   Review of Systems Review of Systems  Unable to perform ROS: Mental status change     Physical Exam Updated Vital Signs BP (!) 167/74   Pulse 88   Temp (!) 101.6 F (38.7 C) (Rectal)   Resp 20   Ht 5\' 10"  (1.778 m)   Wt 71.7 kg (158 lb)   SpO2 97%   BMI 22.67 kg/m   Physical Exam  Constitutional: He appears well-developed. He appears distressed.  Elderly, frail  HENT:  Head:  Normocephalic and atraumatic.  Right Ear: External ear normal.  Left Ear: External ear normal.  Eyes: Pupils are equal, round, and reactive to light. Conjunctivae and EOM are normal.  Neck: Normal range of motion and phonation normal. Neck supple.  Cardiovascular: Normal rate, regular rhythm and normal heart sounds.  Pulmonary/Chest: Effort normal and breath sounds normal. He exhibits no bony tenderness.  Abdominal: Soft. There is no tenderness.  Genitourinary:  Genitourinary Comments: External genitalia with normal-appearing scrotum and scrotal contents.  Urethral meatus somewhat abnormal, inferior portion has what appears to be granulation tissue with a very small amount of discharge associated with it.  This area is mildly tender  to palpation.  It appears there is a tear of the inferior portion of the urethral meatus, likely occurring when the patient pulled out his Foley catheter, with the balloon intact, 4 days ago.  There is no deformity of the penile shaft.  There is no evidence for Fournier's gangrene.    Musculoskeletal:  Left BKA  Neurological: He is alert. He exhibits normal muscle tone.  Nonverbal but alert and responds to examiner.  Skin: Skin is warm, dry and intact.  Psychiatric: His behavior is normal.  He seems to respond to his external environment appropriately but does not speak.  Nursing note and vitals reviewed.       ED Treatments / Results  Labs (all labs ordered are listed, but only abnormal results are displayed) Labs Reviewed  COMPREHENSIVE METABOLIC PANEL - Abnormal; Notable for the following components:      Result Value   CO2 19 (*)    Glucose, Bld 499 (*)    BUN 32 (*)    Total Protein 8.4 (*)    AST 14 (*)    Total Bilirubin 1.4 (*)    Anion gap 21 (*)    All other components within normal limits  CBC WITH DIFFERENTIAL/PLATELET - Abnormal; Notable for the following components:   WBC 10.8 (*)    Hemoglobin 12.2 (*)    Neutro Abs 8.6 (*)    All  other components within normal limits  URINALYSIS, ROUTINE W REFLEX MICROSCOPIC - Abnormal; Notable for the following components:   Glucose, UA >=500 (*)    Ketones, ur 80 (*)    Protein, ur 100 (*)    Bacteria, UA RARE (*)    All other components within normal limits  CBG MONITORING, ED - Abnormal; Notable for the following components:   Glucose-Capillary 420 (*)    All other components within normal limits  URINE CULTURE  CULTURE, BLOOD (ROUTINE X 2)  CULTURE, BLOOD (ROUTINE X 2)  PROTIME-INR  LACTIC ACID, PLASMA  LACTIC ACID, PLASMA    EKG EKG Interpretation  Date/Time:  Friday December 26 2017 10:29:12 EDT Ventricular Rate:  104 PR Interval:    QRS Duration: 103 QT Interval:  334 QTC Calculation: 440 R Axis:   63 Text Interpretation:  Sinus tachycardia Abnormal R-wave progression, early transition Borderline T wave abnormalities Since last tracing rate faster Confirmed by Mancel Bale [phone]) on 12/26/2017 2:21:10 PM   Radiology Dg Chest Port 1 View  Result Date: 12/26/2017 CLINICAL DATA:  Altered mental status.  Fever. EXAM: PORTABLE CHEST 1 VIEW COMPARISON:  November 25, 2017 FINDINGS: There is mild left base atelectasis. Lungs elsewhere are clear. Heart size and pulmonary vascularity are normal. No adenopathy. No bone lesions. IMPRESSION: Mild left base atelectasis. No edema or consolidation. Stable cardiac silhouette. Electronically Signed   By: Bretta Bang III M.D.   On: 12/26/2017 11:35    Procedures .Critical Care Performed by: Mancel Bale, MD Authorized by: Mancel Bale, MD   Critical care provider statement:    Critical care time (minutes):  35   Critical care start time:  12/26/2017 10:30 AM   Critical care end time:  12/26/2017 2:40 PM   Critical care time was exclusive of:  Separately billable procedures and treating other patients   Critical care was necessary to treat or prevent imminent or life-threatening deterioration of the following conditions:   Sepsis   Critical care was time spent personally by me on the following activities:  Blood draw for specimens, development  of treatment plan with patient or surrogate, discussions with consultants, evaluation of patient's response to treatment, examination of patient, obtaining history from patient or surrogate, ordering and performing treatments and interventions, ordering and review of laboratory studies, pulse oximetry, re-evaluation of patient's condition, review of old charts and ordering and review of radiographic studies   (including critical care time)  Medications Ordered in ED Medications  insulin regular (NOVOLIN R,HUMULIN R) 100 Units in sodium chloride 0.9 % 100 mL (1 Units/mL) infusion (has no administration in time range)  dextrose 5 %-0.45 % sodium chloride infusion (has no administration in time range)  aztreonam (AZACTAM) 1 g in sodium chloride 0.9 % 100 mL IVPB (has no administration in time range)  acetaminophen (TYLENOL) suppository 650 mg (650 mg Rectal Given 12/26/17 1103)  sodium chloride 0.9 % bolus 2,151 mL (0 mL/kg  71.7 kg Intravenous Stopped 12/26/17 1253)     Initial Impression / Assessment and Plan / ED Course  I have reviewed the triage vital signs and the nursing notes.  Pertinent labs & imaging results that were available during my care of the patient were reviewed by me and considered in my medical decision making (see chart for details).  Clinical Course as of Dec 27 1519  Fri Dec 26, 2017  1433 High  CBG monitoring, ED(!) [EW]  1433 Normal except elevated glucose, ketones, protein and white blood cells.  Also rare bacteria are present.  Urinalysis, Routine w reflex microscopic(!) [EW]  1433 Normal except white count elevated 10.8  CBC with Differential(!) [EW]  1433 Normal except CO2 low 19, glucose high 499, BUN high 32, total protein high at 8.4, AST low 14, total bilirubin high 1.4, and anion gap elevated 21  Comprehensive metabolic panel(!) [EW]    1434 Normal  Lactic acid, plasma [EW]  1439 I discussed the case with Dr. Belva BertinBob Kohler, [facility].  He is comfortable with the patient being hospitalized at this time.   [EW]  1439 Case discussed with urology, Dr. Annabell HowellsWrenn, regarding his urethral meatus.  We reviewed the case, and the image which is in the chart.  Dr. Annabell HowellsWrenn feels like this appears to be a erosion at the urethral meatus, from Foley catheterization irritation.  He recommends symptomatic treatment for it.  No intervention needed from urology at this time.   [EW]    Clinical Course User Index [EW] Mancel BaleWentz, Wyoma Genson, MD     Patient Vitals for the past 24 hrs:  BP Temp Temp src Pulse Resp SpO2 Height Weight  12/26/17 1428 (!) 167/74 (!) 101.6 F (38.7 C) Rectal 88 20 97 % - -  12/26/17 1330 (!) 153/78 - - 87 (!) 22 98 % - -  12/26/17 1300 (!) 145/73 - - 88 (!) 24 97 % - -  12/26/17 1230 (!) 149/73 - - 88 (!) 27 98 % - -  12/26/17 1200 (!) 151/74 - - 91 (!) 26 97 % - -  12/26/17 1130 (!) 153/75 - - 97 (!) 25 98 % - -  12/26/17 1100 - - - (!) 105 (!) 23 98 % - -  12/26/17 1032 (!) 146/81 - - - - - - -  12/26/17 1031 - - - - - - 5\' 10"  (1.778 m) 71.7 kg (158 lb)  12/26/17 1030 (!) 146/81 (!) 103 F (39.4 C) Rectal (!) 104 (!) 24 96 % - -    2:36 PM Reevaluation with update and discussion. After initial  assessment and treatment, an updated evaluation reveals no change in clinical status.  Family member again updated on findings.  Questions answered. Mancel Bale   Medical Decision Making: Fever likely UTI, with secondary hyperglycemia, and DKA.  Possible contribution to sepsis picture, injury to penis with wound at the meatus after Foley catheter was inadvertently pulled out.  This wound appears to have granulation tissue within it and a very small amount of drainage.  It is also somewhat tender.  Patient will require admission for fluid resuscitation, parenteral antibiotics, and close  monitoring.  CRITICAL CARE-yes Performed by: Mancel Bale   Nursing Notes Reviewed/ Care Coordinated Applicable Imaging Reviewed Interpretation of Laboratory Data incorporated into ED treatment  2:41 PM-Consult complete with hospitalist. Patient case explained and discussed.  He agrees to admit patient for further evaluation and treatment. Call ended at 2:53 PM  Plan: Admit   Final Clinical Impressions(s) / ED Diagnoses   Final diagnoses:  Altered mental status    ED Discharge Orders    None       Mancel Bale, MD 12/26/17 1521

## 2017-12-26 NOTE — H&P (Signed)
History and Physical  Alan [name]. AVW:[id number] DOB: 11-15-1945 DOA: 12/26/2017   PCP: Alan Aschoff, MD   Patient coming from: Home  Chief Complaint: somnolence  HPI:  Alan [name]. is a 72 y.o. male with medical history of coronary disease, diabetes mellitus, dementia, stroke, and depression presenting with 4-day history of increasing somnolence and decreased oral intake.  The patient is unable to provide any history.  All his history is obtained from review of the medical record and speaking with the patient's wife.  Apparently, the patient had a Foley catheter placed approximately 2 weeks prior to this admission secondary to urinary retention.  He pulled out his Foley catheter on 12/22/2017.  He went back to see urologist on 12/23/2017.  Decision was made to keep the catheter out as long as the patient was able to urinate spontaneously.  Even with his Foley catheter in place, the patient had complained of continued dysuria.  On the morning of 12/24/2017, the patient had had a fever 1 to 2.0 F and continued somnolence.  On the morning of 12/25/2017, his wife called the [facility] program, and she was instructed to bring the patient to emergency department for further evaluation.  Notably, the patient had been previously on Augmentin for UTI which she finished 1 week prior to this admission.  There is been no complaints of headache, neck pain, chest pain, shortness breath, vomiting.  He has been having some loose stools. In the emergency department, the patient was febrile up to 100.0 F and tachycardic but remained hemodynamically stable saturating 97% on room air.  Patient was started on aztreonam, IV insulin and IV fluids.  Assessment/Plan: Acute metabolic encephalopathy -Secondary to DKA and infectious process and dehydration -IV fluids and IV antibiotics  Sepsis -Present at the time of admission with fever tachycardia -Start empiric meropenem and vancomycin -Check  procalcitonin -Lactic acid peaked 1.7 -IV fluids -Follow urine culture and blood culture  Pyuria -Suggestive of UTI -Patient was given ceftriaxone IM by EMS which may have compromised culture data -Continue empiric antibiotics  Dysphagia -Concerned about aspiration pneumonitis -Speech therapy evaluation -Continue meropenem  DKA, type 2 --patient started on IV insulin with q 1 hour CBG check and q 4 hour BMPs -pt started on aggressive fluid resuscitation -Electrolytes were monitored and repleted -transitioned to [place] insulin once anion gap closed -diet was advanced once anion gap closed -HbA1C-- -holding lantus and metformin  Coronary artery disease -No chest pain presently -Continue aspirin and statin  Loose stools -Check C. Difficile -Patient was recently on Augmentin       Past Medical History:  Diagnosis Date  . Anemia   . Arthritis   . Dementia   . Diabetes mellitus without complication (HCC)   . ETOH abuse   . Myocardial infarction (HCC)   . Peripheral arterial disease (HCC)   . Stroke (HCC)   . Wears dentures   . Wears glasses    Past Surgical History:  Procedure Laterality Date  . ABDOMINAL AORTOGRAM N/A 10/25/2016   Procedure: Abdominal Aortogram;  Surgeon: Sherren Kerns, MD;  Location: [facility] INVASIVE CV LAB;  Service: Cardiovascular;  Laterality: N/A;  . BRAIN SURGERY    . CORONARY STENT PLACEMENT     " about 25 years ago (10/29/16)  . ENDARTERECTOMY FEMORAL Right 10/30/2016   Procedure: Eduardo Osier WITH PATCH GRAFT;  Surgeon: Sherren Kerns, MD;  Location: [facility] OR;  Service: Vascular;  Laterality: Right;  .  IR NEPHRO TUBE REMOV/FL  07/04/2017  . IR NEPHRO TUBE REMOV/FL  07/07/2017  . IR NEPHROSTOMY EXCHANGE LEFT  06/20/2017  . IR NEPHROSTOMY EXCHANGE RIGHT  06/20/2017  . IR NEPHROSTOMY PLACEMENT LEFT  06/12/2017  . IR NEPHROSTOMY PLACEMENT RIGHT  06/12/2017  . IR URETERAL STENT PLACEMENT EXISTING ACCESS LEFT  06/20/2017  . IR  URETERAL STENT PLACEMENT EXISTING ACCESS RIGHT  06/20/2017  . LOWER EXTREMITY ANGIOGRAPHY Right 10/25/2016   Procedure: Lower Extremity Angiography;  Surgeon: Sherren Kerns, MD;  Location: [facility] INVASIVE CV LAB;  Service: Cardiovascular;  Laterality: Right;   Social History:  reports that he has quit smoking. His smoking use included cigarettes. He has never used smokeless tobacco. He reports that he does not drink alcohol or use drugs.   Family History  Problem Relation Age of Onset  . Diabetes Mother   . Heart disease Father   . Heart disease Brother      Allergies  Allergen Reactions  . Penicillins Rash    Has patient had a PCN reaction causing immediate rash, facial/tongue/throat swelling, SOB or lightheadedness with hypotension: Yes Has patient had a PCN reaction causing severe rash involving mucus membranes or skin necrosis: No Has patient had a PCN reaction that required hospitalization No Has patient had a PCN reaction occurring within the last 10 years: No If all of the above answers are "NO", then may proceed with Cephalosporin use.      Prior to Admission medications   Medication Sig Start Date End Date Taking? Authorizing Provider  acetaminophen (TYLENOL) 650 MG CR tablet Take 650 mg by mouth 3 (three) times daily.    Yes [provider]  alendronate (FOSAMAX) 70 MG tablet Take 70 mg by mouth once a week. Take with a full glass of water on an empty stomach.   Yes [provider]  aspirin EC 81 MG tablet Take 81 mg by mouth every morning.    Yes [provider]  atorvastatin (LIPITOR) 10 MG tablet Take 10 mg by mouth at bedtime.    Yes [provider]  Bacillus Coagulans-Inulin (PROBIOTIC FORMULA PO) Take 1 capsule by mouth daily.   Yes [provider]  insulin glargine (LANTUS) 100 UNIT/ML injection Inject 50 Units into the skin at bedtime.  06/11/16  Yes [provider]  LORazepam (ATIVAN) 1 MG tablet Take 0.5  tablets (0.5 mg total) by mouth 2 (two) times daily. 07/07/17  Yes Short, Thea Silversmith, MD  Melatonin 10 MG TABS Take 10 mg by mouth at bedtime.   Yes [provider]  metFORMIN (GLUCOPHAGE) 500 MG tablet Take 1,000 mg by mouth 2 (two) times daily with a meal.   Yes [provider]  sertraline (ZOLOFT) 50 MG tablet Take 50 mg by mouth 2 (two) times daily.    Yes [provider]  tamsulosin (FLOMAX) 0.4 MG CAPS capsule Take 0.4 mg by mouth every evening.   Yes [provider]  traZODone (DESYREL) 50 MG tablet TAKE ONE TABLET (50 MG TOTAL) BY MOUTH AT BEDTIME. 09/11/16  Yes [provider]  Vitamin D, Ergocalciferol, (DRISDOL) 50000 units CAPS capsule Take 50,000 Units by mouth every 30 (thirty) days.   Yes [provider]  amoxicillin-clavulanate (AUGMENTIN) 875-125 MG tablet Take 1 tablet by mouth 2 (two) times daily. 7 day course starting on 11/21/2017    [provider]    Review of Systems:  Unobtainable secondary to patient's mental status  Physical Exam: Vitals:  12/26/17 1230 12/26/17 1300 12/26/17 1330 12/26/17 1428  BP: (!) 149/73 (!) 145/73 (!) 153/78 (!) 167/74  Pulse: 88 88 87 88  Resp: (!) 27 (!) 24 (!) 22 20  Temp:    (!) 101.6 F (38.7 C)  TempSrc:    Rectal  SpO2: 98% 97% 98% 97%  Weight:      Height:       General:  Alert and awake but not speaking, NAD, nontoxic, pleasant/cooperative Head/Eye: No conjunctival hemorrhage, no icterus, [place]/AT, No nystagmus ENT:  No icterus,  No thrush, good dentition, no pharyngeal exudate Neck:  No masses, no lymphadenpathy, no bruits CV:  RRR, no rub, no gallop, no S3 Lung: Bibasilar crackles but no wheezing.  Good air movement. Abdomen: soft/NT, +BS, nondistended, no peritoneal signs Ext: No cyanosis, No rashes, No petechiae, No lymphangitis, No edema; left BKA site without erythema or drainage Neuro: CNII-XII intact, strength 4/5 in bilateral upper and lower extremities, no  dysmetria  Labs on Admission:  Basic Metabolic Panel: Recent Labs  Lab 12/26/17 1040  NA 145  K 4.3  CL 105  CO2 19*  GLUCOSE 499*  BUN 32*  CREATININE 1.07  CALCIUM 8.9   Liver Function Tests: Recent Labs  Lab 12/26/17 1040  AST 14*  ALT 18  ALKPHOS 65  BILITOT 1.4*  PROT 8.4*  ALBUMIN 3.9   No results for input(s): LIPASE, AMYLASE in the last 168 hours. No results for input(s): AMMONIA in the last 168 hours. CBC: Recent Labs  Lab 12/26/17 1040  WBC 10.8*  NEUTROABS 8.6*  HGB 12.2*  HCT 39.0  MCV 92.4  PLT 361   Coagulation Profile: Recent Labs  Lab 12/26/17 1040  INR 1.10   Cardiac Enzymes: No results for input(s): CKTOTAL, CKMB, CKMBINDEX, TROPONINI in the last 168 hours. BNP: Invalid input(s): POCBNP CBG: Recent Labs  Lab 12/26/17 1040 12/26/17 1524  GLUCAP 420* 432*   Urine analysis:    Component Value Date/Time   COLORURINE YELLOW 12/26/2017 1035   APPEARANCEUR CLEAR 12/26/2017 1035   LABSPEC 1.028 12/26/2017 1035   PHURINE 5.0 12/26/2017 1035   GLUCOSEU >=500 (A) 12/26/2017 1035   HGBUR NEGATIVE 12/26/2017 1035   BILIRUBINUR NEGATIVE 12/26/2017 1035   KETONESUR 80 (A) 12/26/2017 1035   PROTEINUR 100 (A) 12/26/2017 1035   NITRITE NEGATIVE 12/26/2017 1035   LEUKOCYTESUR NEGATIVE 12/26/2017 1035   Sepsis Labs: @LABRCNTIP (procalcitonin:4,lacticidven:4) ) Recent Results (from the past 240 hour(s))  Urine culture     Status: None (Preliminary result)   Collection Time: 12/26/17 10:35 AM  Result Value Ref Range Status   Specimen Description   Final    URINE, CATHETERIZED Performed at [facility], [address], [place], [place] [address]    Special Requests   Final    NONE Performed at [facility], [address], [place], [place] [address]    Culture PENDING  Incomplete   Report Status PENDING  Incomplete     Radiological Exams on Admission: Dg Chest Port 1 View  Result Date: 12/26/2017 CLINICAL DATA:  Altered  mental status.  Fever. EXAM: PORTABLE CHEST 1 VIEW COMPARISON:  November 25, 2017 FINDINGS: There is mild left base atelectasis. Lungs elsewhere are clear. Heart size and pulmonary vascularity are normal. No adenopathy. No bone lesions. IMPRESSION: Mild left base atelectasis. No edema or consolidation. Stable cardiac silhouette. Electronically Signed   By: Bretta Bang III M.D.   On: 12/26/2017 11:35    EKG: Independently reviewed. Sinus,  nonspecific T wave changes    Time spent:60 minutes Code Status:   DNR Family Communication:  Spouse updated at bedside 7/5 Disposition Plan: expect 2-3 day hospitalization Consults called: none DVT Prophylaxis: [place] Lovenox  Catarina Hartshornavid Donivan Thammavong, DO  [facility] Pager [phone]  If 7PM-7AM, please contact night-coverage [email or web address] Password [email or web address] 12/26/2017, 3:40 PM

## 2017-12-26 NOTE — Progress Notes (Addendum)
Pharmacy Antibiotic Note  Margaretha SeedsGlenn A Leino Jr. is a 72 y.o. male admitted on 12/26/2017 with sepsis.  Pharmacy has been consulted for Vancomycin dosing.  Plan: Vancomcyin 1500 mg IV x 1 dose Vancomycin 750 mg IV every 12 hours.  Goal trough 15-20 mcg/mL.  Meropenem 1000 mg IV every 8 hours. Monitor labs, c/s, and vanco trough as indicated  Height: 5\' 10"  (177.8 cm) Weight: 158 lb (71.7 kg) IBW/kg (Calculated) : 73  Temp (24hrs), Avg:102.3 F (39.1 C), Min:101.6 F (38.7 C), Max:103 F (39.4 C)  Recent Labs  Lab 12/26/17 1040 12/26/17 1057 12/26/17 1215  WBC 10.8*  --   --   CREATININE 1.07  --   --   LATICACIDVEN  --  1.7 1.4    Estimated Creatinine Clearance: 66.1 mL/min (by C-G formula based on SCr of 1.07 mg/dL).    Allergies  Allergen Reactions  . Penicillins Rash    Has patient had a PCN reaction causing immediate rash, facial/tongue/throat swelling, SOB or lightheadedness with hypotension: Yes Has patient had a PCN reaction causing severe rash involving mucus membranes or skin necrosis: No Has patient had a PCN reaction that required hospitalization No Has patient had a PCN reaction occurring within the last 10 years: No If all of the above answers are "NO", then may proceed with Cephalosporin use.     Antimicrobials this admission: Vanco 7/5 >>  Meropenem 7/5 >>  Dose adjustments this admission: N/A  Microbiology results: 7/5 BCx: pending 7/5 UCx: pending    Thank you for allowing pharmacy to be a part of this patient's care.  Tad MooreSteven C Jaleya Pebley 12/26/2017 3:50 PM

## 2017-12-27 ENCOUNTER — Encounter (HOSPITAL_COMMUNITY): Payer: Self-pay | Admitting: *Deleted

## 2017-12-27 ENCOUNTER — Other Ambulatory Visit: Payer: Self-pay

## 2017-12-27 DIAGNOSIS — E1111 Type 2 diabetes mellitus with ketoacidosis with coma: Secondary | ICD-10-CM

## 2017-12-27 DIAGNOSIS — E1165 Type 2 diabetes mellitus with hyperglycemia: Secondary | ICD-10-CM

## 2017-12-27 DIAGNOSIS — Z794 Long term (current) use of insulin: Secondary | ICD-10-CM

## 2017-12-27 LAB — BASIC METABOLIC PANEL WITH GFR
Anion gap: 7 (ref 5–15)
BUN: 25 mg/dL — ABNORMAL HIGH (ref 8–23)
CO2: 27 mmol/L (ref 22–32)
Calcium: 7.8 mg/dL — ABNORMAL LOW (ref 8.9–10.3)
Chloride: 115 mmol/L — ABNORMAL HIGH (ref 98–111)
Creatinine, Ser: 0.55 mg/dL — ABNORMAL LOW (ref 0.61–1.24)
GFR calc Af Amer: >60 mL/min
GFR calc non Af Amer: >60 mL/min
Glucose, Bld: 158 mg/dL — ABNORMAL HIGH (ref 70–99)
Potassium: 3 mmol/L — ABNORMAL LOW (ref 3.5–5.1)
Sodium: 149 mmol/L — ABNORMAL HIGH (ref 135–145)

## 2017-12-27 LAB — PHOSPHORUS: PHOSPHORUS: 1.4 mg/dL — AB (ref 2.5–4.6)

## 2017-12-27 LAB — BASIC METABOLIC PANEL
Anion gap: 7 (ref 5–15)
BUN: 22 mg/dL (ref 8–23)
CALCIUM: 7.6 mg/dL — AB (ref 8.9–10.3)
CHLORIDE: 112 mmol/L — AB (ref 98–111)
CO2: 27 mmol/L (ref 22–32)
CREATININE: 0.55 mg/dL — AB (ref 0.61–1.24)
GFR calc non Af Amer: >60 mL/min (ref >60)
GLUCOSE: 157 mg/dL — AB (ref 70–99)
Potassium: 3.1 mmol/L — ABNORMAL LOW (ref 3.5–5.1)
Sodium: 146 mmol/L — ABNORMAL HIGH (ref 135–145)

## 2017-12-27 LAB — BLOOD CULTURE ID PANEL (REFLEXED)

## 2017-12-27 LAB — GLUCOSE, CAPILLARY
GLUCOSE-CAPILLARY: 148 mg/dL — AB (ref 70–99)
GLUCOSE-CAPILLARY: 147 mg/dL — AB (ref 70–99)
GLUCOSE-CAPILLARY: 169 mg/dL — AB (ref 70–99)
GLUCOSE-CAPILLARY: 199 mg/dL — AB (ref 70–99)
GLUCOSE-CAPILLARY: 232 mg/dL — AB (ref 70–99)
Glucose-Capillary: 128 mg/dL — ABNORMAL HIGH (ref 70–99)
Glucose-Capillary: 132 mg/dL — ABNORMAL HIGH (ref 70–99)
Glucose-Capillary: 147 mg/dL — ABNORMAL HIGH (ref 70–99)
Glucose-Capillary: 152 mg/dL — ABNORMAL HIGH (ref 70–99)
Glucose-Capillary: 159 mg/dL — ABNORMAL HIGH (ref 70–99)
Glucose-Capillary: 166 mg/dL — ABNORMAL HIGH (ref 70–99)
Glucose-Capillary: 167 mg/dL — ABNORMAL HIGH (ref 70–99)
Glucose-Capillary: 174 mg/dL — ABNORMAL HIGH (ref 70–99)

## 2017-12-27 LAB — CBC
HCT: 32.5 % — ABNORMAL LOW (ref 39.0–52.0)
HEMOGLOBIN: 10.1 g/dL — AB (ref 13.0–17.0)
MCH: 28.6 pg (ref 26.0–34.0)
MCHC: 31.1 g/dL (ref 30.0–36.0)
MCV: 92.1 fL (ref 78.0–100.0)
PLATELETS: 331 10*3/uL (ref 150–400)
RBC: 3.53 MIL/uL — AB (ref 4.22–5.81)
RDW: 13.4 % (ref 11.5–15.5)
WBC: 11.8 10*3/uL — AB (ref 4.0–10.5)

## 2017-12-27 LAB — HIV ANTIBODY (ROUTINE TESTING W REFLEX): HIV SCREEN 4TH GENERATION: NONREACTIVE

## 2017-12-27 LAB — MRSA PCR SCREENING: MRSA by PCR: NEGATIVE

## 2017-12-27 LAB — PROCALCITONIN

## 2017-12-27 LAB — MAGNESIUM: Magnesium: 1.8 mg/dL (ref 1.7–2.4)

## 2017-12-27 MED ORDER — POTASSIUM CHLORIDE IN NACL 20-0.45 MEQ/L-% IV SOLN
INTRAVENOUS | Status: DC
  Administered 2017-12-27 – 2017-12-29 (×4): via INTRAVENOUS
  Filled 2017-12-27 (×9): qty 1000

## 2017-12-27 MED ORDER — POTASSIUM CHLORIDE 10 MEQ/100ML IV SOLN
10.0000 meq | INTRAVENOUS | Status: AC
  Administered 2017-12-27 (×5): 10 meq via INTRAVENOUS
  Filled 2017-12-27 (×5): qty 100

## 2017-12-27 MED ORDER — POTASSIUM PHOSPHATE MONOBASIC 500 MG PO TABS
500.0000 mg | ORAL_TABLET | Freq: Three times a day (TID) | ORAL | Status: DC
  Filled 2017-12-27 (×5): qty 1

## 2017-12-27 MED ORDER — INSULIN GLARGINE 100 UNIT/ML ~~LOC~~ SOLN
20.0000 [IU] | Freq: Every day | SUBCUTANEOUS | Status: DC
  Administered 2017-12-27 – 2017-12-28 (×2): 20 [IU] via SUBCUTANEOUS
  Filled 2017-12-27 (×3): qty 0.2

## 2017-12-27 MED ORDER — ACETAMINOPHEN 325 MG PO TABS
650.0000 mg | ORAL_TABLET | Freq: Four times a day (QID) | ORAL | Status: DC
  Administered 2017-12-27 – 2017-12-29 (×7): 650 mg via ORAL
  Filled 2017-12-27 (×8): qty 2

## 2017-12-27 MED ORDER — POTASSIUM CHLORIDE CRYS ER 20 MEQ PO TBCR
40.0000 meq | EXTENDED_RELEASE_TABLET | Freq: Once | ORAL | Status: AC
  Administered 2017-12-27: 40 meq via ORAL
  Filled 2017-12-27: qty 2

## 2017-12-27 MED ORDER — INSULIN ASPART 100 UNIT/ML ~~LOC~~ SOLN
0.0000 [IU] | Freq: Every day | SUBCUTANEOUS | Status: DC
  Administered 2017-12-27: 3 [IU] via SUBCUTANEOUS

## 2017-12-27 MED ORDER — INSULIN ASPART 100 UNIT/ML ~~LOC~~ SOLN
0.0000 [IU] | Freq: Three times a day (TID) | SUBCUTANEOUS | Status: DC
  Administered 2017-12-27 (×2): 2 [IU] via SUBCUTANEOUS
  Administered 2017-12-28: 5 [IU] via SUBCUTANEOUS
  Administered 2017-12-28: 3 [IU] via SUBCUTANEOUS

## 2017-12-27 NOTE — Progress Notes (Addendum)
PROGRESS NOTE  Alan SeedsGlenn A Clauss Jr. [id number] DOB: [birth year] DOA: 12/26/2017 PCP: Alan [name], Alan Anne, Alan [name]  Brief History:   72 y.o. male with medical history of coronary disease, diabetes mellitus, dementia, stroke, and depression presenting with 4-day history of increasing somnolence and decreased oral intake.   Apparently, the patient had a Foley catheter placed approximately 2 weeks prior to this admission secondary to urinary retention.  He pulled out his Foley catheter on 12/22/2017.  He went back to see urologist on 12/23/2017.  Decision was made to keep the catheter out as long as the patient was able to urinate spontaneously.  Even with his Foley catheter in place, the patient had complained of continued dysuria.  On the morning of 12/24/2017, the patient had had a fever 102.0 F and continued somnolence.  On the morning of 12/25/2017, his wife called the [facility] program, and she was instructed to bring the patient to emergency department for further evaluation.  Notably, the patient had been previously on Augmentin for UTI which she finished 1 week prior to this admission.  There is been no complaints of headache, neck pain, chest pain, shortness breath, vomiting.  He has been having some loose stools. In the emergency department, the patient was febrile up to 103.0 F and tachycardic but remained hemodynamically stable saturating 97% on room air.  Pt was noted to be in DKA with serum glucose of 499.  He was started on IV insulin, IVF, merrrem and vancomycin.   Assessment/Plan: Acute metabolic encephalopathy -Secondary to DKA and infectious process and dehydration -IV fluids and IV antibiotics -improving but remains confused  Sepsis -Present at the time of admission with fever tachycardia -continue empiric meropenem and vancomycin -Check procalcitonin <0.10 -Lactic acid peaked 1.7 -IV fluids--add KCl -Follow urine culture and blood culture  Pyuria -Suggestive of UTI -Patient  was given ceftriaxone IM by EMS which may have compromised culture data -Continue empiric antibiotics  Dysphagia -Concerned about aspiration pneumonitis -Speech therapy evaluation -Continue meropenem  DKA, type 2 --patient started on IV insulin with q 1 hour CBG check and q 4 hour BMPs -pt started on aggressive fluid resuscitation -Electrolytes were monitored and repleted -transitioned to [place] insulin once anion gap closed -diet was advanced once anion gap closed -HbA1C--11.1  Diabetes mellitus type 2, uncontrolled with hyperglycemia -start Lantus 20 units daily -novolog sliding scale  Coronary artery disease -No chest pain presently -Continue aspirin and statin  Loose stools -Check C. Difficile -Patient was recently on Augmentin  Hypokalemia -replete -check mag     Disposition Plan:   Home in 1-2 days  Family Communication:  No Family at bedside  Consultants:  none  Code Status:  DNR  DVT Prophylaxis:  [place] Lovenox   Procedures: As Listed in Progress Note Above  Antibiotics: None    Subjective: Patient remains pleasantly confused but he answers occasional yes/no questions.  He denies a headache, chest pain, shortness breath, abdominal pain.  There is no vomiting or diarrhea.  He feels hungry.  Objective: Vitals:   12/27/17 0700 12/27/17 0747 12/27/17 0800 12/27/17 0900  BP: (!) 147/70  (!) 145/72 (!) 147/73  Pulse: 70 70 70 69  Resp: (!) 24 (!) 24 (!) 23 (!) 22  Temp:  99 F (37.2 C)    TempSrc:  Oral    SpO2: 95% 96% 97% 98%  Weight:      Height:        Intake/Output  Summary (Last 24 hours) at 12/27/2017 1610 Last data filed at 12/27/2017 0800 Gross per 24 hour  Intake 4627.55 ml  Output 500 ml  Net 4127.55 ml   Weight change:  Exam:   General:  Pt is alert, intermittently follows commands appropriately, not in acute distress  HEENT: No icterus, No thrush, No neck mass, [place]/AT  Cardiovascular: RRR, S1/S2, no rubs, no  gallops  Respiratory: Bibasilar rales but no wheezing.  Good air movement.  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   Data Reviewed: I have personally reviewed following labs and imaging studies Basic Metabolic Panel: Recent Labs  Lab 12/26/17 1040 12/26/17 1917 12/26/17 2252 12/27/17 0445  NA 145 149* 148* 149*  K 4.3 3.3* 2.8* 3.0*  CL 105 114* 116* 115*  CO2 19* 22 24 27   GLUCOSE 499* 303* 199* 158*  BUN 32* 30* 27* 25*  CREATININE 1.07 0.88 0.70 0.55*  CALCIUM 8.9 8.2* 7.8* 7.8*  MG 2.1  --   --  1.8  PHOS  --   --   --  1.4*   Liver Function Tests: Recent Labs  Lab 12/26/17 1040  AST 14*  ALT 18  ALKPHOS 65  BILITOT 1.4*  PROT 8.4*  ALBUMIN 3.9   No results for input(s): LIPASE, AMYLASE in the last 168 hours. No results for input(s): AMMONIA in the last 168 hours. Coagulation Profile: Recent Labs  Lab 12/26/17 1040  INR 1.10   CBC: Recent Labs  Lab 12/26/17 1040 12/27/17 0445  WBC 10.8* 11.8*  NEUTROABS 8.6*  --   HGB 12.2* 10.1*  HCT 39.0 32.5*  MCV 92.4 92.1  PLT 361 331   Cardiac Enzymes: No results for input(s): CKTOTAL, CKMB, CKMBINDEX, TROPONINI in the last 168 hours. BNP: Invalid input(s): POCBNP CBG: Recent Labs  Lab 12/27/17 0414 12/27/17 0530 12/27/17 0626 12/27/17 0745 12/27/17 0914  GLUCAP 147* 169* 174* 159* 147*   HbA1C: Recent Labs    12/26/17 1040  HGBA1C 11.1*   Urine analysis:    Component Value Date/Time   COLORURINE YELLOW 12/26/2017 1035   APPEARANCEUR CLEAR 12/26/2017 1035   LABSPEC 1.028 12/26/2017 1035   PHURINE 5.0 12/26/2017 1035   GLUCOSEU >=500 (A) 12/26/2017 1035   HGBUR NEGATIVE 12/26/2017 1035   BILIRUBINUR NEGATIVE 12/26/2017 1035   KETONESUR 80 (A) 12/26/2017 1035   PROTEINUR 100 (A) 12/26/2017 1035   NITRITE NEGATIVE 12/26/2017 1035    LEUKOCYTESUR NEGATIVE 12/26/2017 1035   Sepsis Labs: @LABRCNTIP (procalcitonin:4,lacticidven:4) ) Recent Results (from the past 240 hour(s))  Urine culture     Status: None (Preliminary result)   Collection Time: 12/26/17 10:35 AM  Result Value Ref Range Status   Specimen Description   Final    URINE, CATHETERIZED Performed at [facility], [address], [place], [place] [address]    Special Requests   Final    NONE Performed at [facility], [address] [place], [place] [address]    Culture PENDING  Incomplete   Report Status PENDING  Incomplete  Culture, blood (Routine x 2)     Status: None (Preliminary result)   Collection Time: 12/26/17 10:55 AM  Result Value Ref Range Status   Specimen Description BLOOD  Final   Special Requests Normal  Final   Culture   Final    NO GROWTH < 24 HOURS Performed at [facility], [address], [place], [place] [address]    Report Status PENDING  Incomplete  Culture, blood (Routine x 2)     Status: None (Preliminary result)   Collection Time: 12/26/17 10:57 AM  Result Value Ref Range Status   Specimen Description BLOOD  Final   Special Requests Normal  Final   Culture   Final    NO GROWTH < 24 HOURS Performed at [facility] [address], [place], [place] [address]    Report Status PENDING  Incomplete  MRSA PCR Screening     Status: None   Collection Time: 12/26/17  5:52 PM  Result Value Ref Range Status   MRSA by PCR NEGATIVE NEGATIVE Final    Comment:        The GeneXpert MRSA Assay (FDA approved for NASAL specimens only), is one component of a comprehensive MRSA colonization surveillance program. It is not intended to diagnose MRSA infection nor to guide or monitor treatment for MRSA infections. Performed at [facility], [address] [place], [place] [address]      Scheduled Meds: . aspirin EC  81 mg Oral q morning - 10a  . atorvastatin  10 mg Oral QHS  . enoxaparin (LOVENOX) injection  40 mg  Subcutaneous Q24H  . insulin aspart  0-5 Units Subcutaneous QHS  . insulin aspart  0-9 Units Subcutaneous TID WC  . insulin glargine  20 Units Subcutaneous Daily  . sertraline  50 mg Oral BID  . tamsulosin  0.4 mg Oral QPM   Continuous Infusions: . 0.45 % NaCl with KCl 20 mEq / L    . meropenem (MERREM) IV Stopped (12/27/17 0946)  . vancomycin Stopped (12/27/17 0548)    Procedures/Studies: Dg Chest Port 1 View  Result Date: 12/26/2017 CLINICAL DATA:  Altered mental status.  Fever. EXAM: PORTABLE CHEST 1 VIEW COMPARISON:  November 25, 2017 FINDINGS: There is mild left base atelectasis. Lungs elsewhere are clear. Heart size and pulmonary vascularity are normal. No adenopathy. No bone lesions. IMPRESSION: Mild left base atelectasis. No edema or consolidation. Stable cardiac silhouette. Electronically Signed   By: Bretta Bang III M.D.   On: 12/26/2017 11:35    Catarina Hartshorn, DO  [facility] Pager [phone]  If 7PM-7AM, please contact night-coverage [email or web address] Password [email or web address] 12/27/2017, 9:22 AM   LOS: 1 day

## 2017-12-28 ENCOUNTER — Encounter (HOSPITAL_COMMUNITY): Payer: Self-pay

## 2017-12-28 DIAGNOSIS — A414 Sepsis due to anaerobes: Secondary | ICD-10-CM

## 2017-12-28 DIAGNOSIS — N390 Urinary tract infection, site not specified: Secondary | ICD-10-CM

## 2017-12-28 DIAGNOSIS — B9689 Other specified bacterial agents as the cause of diseases classified elsewhere: Secondary | ICD-10-CM

## 2017-12-28 DIAGNOSIS — A4159 Other Gram-negative sepsis: Secondary | ICD-10-CM

## 2017-12-28 LAB — BASIC METABOLIC PANEL
Anion gap: 7 (ref 5–15)
BUN: 15 mg/dL (ref 8–23)
CALCIUM: 7.8 mg/dL — AB (ref 8.9–10.3)
CO2: 28 mmol/L (ref 22–32)
CREATININE: 0.56 mg/dL — AB (ref 0.61–1.24)
Chloride: 104 mmol/L (ref 98–111)
GFR calc non Af Amer: >60 mL/min (ref >60)
GLUCOSE: 261 mg/dL — AB (ref 70–99)
Potassium: 4 mmol/L (ref 3.5–5.1)
Sodium: 139 mmol/L (ref 135–145)

## 2017-12-28 LAB — CBC
HCT: 33.9 % — ABNORMAL LOW (ref 39.0–52.0)
Hemoglobin: 10.6 g/dL — ABNORMAL LOW (ref 13.0–17.0)
MCH: 28.4 pg (ref 26.0–34.0)
MCHC: 31.3 g/dL (ref 30.0–36.0)
MCV: 90.9 fL (ref 78.0–100.0)
Platelets: 309 10*3/uL (ref 150–400)
RBC: 3.73 MIL/uL — AB (ref 4.22–5.81)
RDW: 13.2 % (ref 11.5–15.5)
WBC: 10.5 10*3/uL (ref 4.0–10.5)

## 2017-12-28 LAB — PROCALCITONIN: Procalcitonin: <0.1 ng/mL

## 2017-12-28 LAB — MAGNESIUM: MAGNESIUM: 1.7 mg/dL (ref 1.7–2.4)

## 2017-12-28 LAB — URINE CULTURE

## 2017-12-28 LAB — GLUCOSE, CAPILLARY
GLUCOSE-CAPILLARY: 154 mg/dL — AB (ref 70–99)
Glucose-Capillary: 294 mg/dL — ABNORMAL HIGH (ref 70–99)
Glucose-Capillary: 210 mg/dL — ABNORMAL HIGH (ref 70–99)
Glucose-Capillary: 189 mg/dL — ABNORMAL HIGH (ref 70–99)

## 2017-12-28 LAB — PHOSPHORUS: Phosphorus: 1.6 mg/dL — ABNORMAL LOW (ref 2.5–4.6)

## 2017-12-28 MED ORDER — INSULIN ASPART 100 UNIT/ML ~~LOC~~ SOLN: 0.0000 [IU] | Freq: Every day | SUBCUTANEOUS | Status: DC

## 2017-12-28 MED ORDER — INSULIN GLARGINE 100 UNIT/ML ~~LOC~~ SOLN
30.0000 [IU] | Freq: Every day | SUBCUTANEOUS | Status: DC
  Administered 2017-12-29: 30 [IU] via SUBCUTANEOUS
  Filled 2017-12-28 (×2): qty 0.3

## 2017-12-28 MED ORDER — INSULIN GLARGINE 100 UNIT/ML ~~LOC~~ SOLN
10.0000 [IU] | Freq: Once | SUBCUTANEOUS | Status: AC
  Administered 2017-12-28: 10 [IU] via SUBCUTANEOUS
  Filled 2017-12-28: qty 0.1

## 2017-12-28 MED ORDER — K PHOS MONO-SOD PHOS DI & MONO 155-852-130 MG PO TABS
250.0000 mg | ORAL_TABLET | Freq: Three times a day (TID) | ORAL | Status: DC
  Administered 2017-12-28 – 2017-12-29 (×4): 250 mg via ORAL
  Filled 2017-12-28 (×4): qty 1

## 2017-12-28 MED ORDER — KETOROLAC TROMETHAMINE 15 MG/ML IJ SOLN
15.0000 mg | Freq: Four times a day (QID) | INTRAMUSCULAR | Status: DC | PRN
  Administered 2017-12-29: 15 mg via INTRAVENOUS
  Filled 2017-12-28: qty 1

## 2017-12-28 MED ORDER — MAGNESIUM SULFATE 2 GM/50ML IV SOLN
2.0000 g | Freq: Once | INTRAVENOUS | Status: AC
  Administered 2017-12-28: 2 g via INTRAVENOUS
  Filled 2017-12-28: qty 50

## 2017-12-28 MED ORDER — INSULIN ASPART 100 UNIT/ML ~~LOC~~ SOLN
0.0000 [IU] | Freq: Three times a day (TID) | SUBCUTANEOUS | Status: DC
  Administered 2017-12-28 – 2017-12-29 (×2): 3 [IU] via SUBCUTANEOUS
  Administered 2017-12-29: 8 [IU] via SUBCUTANEOUS

## 2017-12-28 NOTE — Discharge Summary (Signed)
Physician Discharge Summary  Alan SeedsGlenn A Woolever Jr. ZOX:[id number] DOB: Sep 25, 1945 DOA: 12/26/2017  PCP: Miguel AschoffWilliams, Julie Anne, MD  Admit date: 12/26/2017 Discharge date: 12/29/2017  Admitted From: Home Disposition:  Home   Recommendations for Outpatient Follow-up:  1. Follow up with PCP in 1-2 weeks 2. Please continue meropenem through 01/02/18 3. Discontinue PICC line after last dose of meropenem on 01/02/18   Home Health: YES Equipment/Devices: PICC, HHPT  Discharge Condition: Stable CODE STATUS: DNR Diet recommendation: Heart Healthy / Carb Modified    Brief/Interim Summary: 72 y.o.malewith medical history ofcoronary disease, diabetes mellitus, dementia, stroke, and depression presenting with 4-day history of increasing somnolence and decreased oral intake. Apparently, the patient had a Foley catheter placed approximately 2 weeks prior to this admission secondary to urinary retention. He pulled outhis Foley catheter on 12/22/2017.He went back to see urologist on 12/23/2017. Decision was made to keep the catheter out as long as the patient was able to urinate spontaneously. Even with his Foley catheter in place, the patient had complained of continued dysuria. On the morning of 12/24/2017, the patient had had a fever 102.0 F and continued somnolence. On the morning of 12/25/2017, his wife called the [facility],and she was instructed to bring the patient to emergency department for further evaluation. Notably, the patient had been previously on Augmentin for UTI which she finished 1 week prior to this admission. There is been no complaints of headache, neck pain, chest pain, shortness breath, vomiting. He has been having some loose stools. In the emergency department, the patient was febrile up to 103.0 F and tachycardic but remained hemodynamically stable saturating 97% on room air.Pt was noted to be in DKA with serum glucose of 499. He was started on IV insulin, IVF, merrrem and  vancomycin.  Once his urine culture resulted with ESBL Klebsiella, vancomycin was discontinued.     Discharge Diagnoses:   Acute metabolic encephalopathy -Secondary to DKA and infectious process and dehydration -IV fluids and IV antibiotics -improved -now near baseline per spouse at time of discharge  Sepsis -Present at the time of admission with fever tachycardia -continuemeropenem  -d/c vanco -Check procalcitonin<0.10 -Lactic acid peaked 1.7 -IV fluids--add KCl -Follow urine culture--ESBL Klebsiella -Blood culture = contaminant -sepsis physiology resolved  UTI--ESBL Klebsiella -continue merrem through 01/02/18 -place PICC in anticipation of outpt treatment--plan 7 days  Dysphagia -dysphagia 3 diet -pt is edentulous -Continue meropenem as above  DKA, type 2 --patient started on IV insulin with q 1 hour CBG check and q 4 hour BMPs -pt started on aggressive fluid resuscitation -Electrolytes were monitored and repleted -transitioned to [place] insulin once anion gap closed -diet was advanced once anion gap closed -HbA1C--11.1  Diabetes mellitus type 2, uncontrolled with hyperglycemia -Increase Lantus 30 units daily during the hospitalization -novolog sliding scale--increase to moderate scale -resume home dose after discharge  Coronary artery disease -No chest pain presently -Continue aspirin and statin  Loose stools -Check C. Difficile--no further diarrhea to collect -Patient was recently on Augmentin  Hypokalemia/Hypophosphatemia -repleted -check mag     Discharge Instructions   Allergies as of 12/29/2017      Reactions   Penicillins Rash   Has patient had a PCN reaction causing immediate rash, facial/tongue/throat swelling, SOB or lightheadedness with hypotension: Yes Has patient had a PCN reaction causing severe rash involving mucus membranes or skin necrosis: No Has patient had a PCN reaction that required hospitalization No Has patient had  a PCN reaction occurring within the last 10 years:  No If all of the above answers are "NO", then may proceed with Cephalosporin use.      Medication List    STOP taking these medications   amoxicillin-clavulanate 875-125 MG tablet Commonly known as:  AUGMENTIN     TAKE these medications   acetaminophen 650 MG CR tablet Commonly known as:  TYLENOL Take 650 mg by mouth 3 (three) times daily.   alendronate 70 MG tablet Commonly known as:  FOSAMAX Take 70 mg by mouth once a week. Take with a full glass of water on an empty stomach.   aspirin EC 81 MG tablet Take 81 mg by mouth every morning.   atorvastatin 10 MG tablet Commonly known as:  LIPITOR Take 10 mg by mouth at bedtime.   insulin glargine 100 UNIT/ML injection Commonly known as:  LANTUS Inject 50 Units into the skin at bedtime.   LORazepam 1 MG tablet Commonly known as:  ATIVAN Take 0.5 tablets (0.5 mg total) by mouth 2 (two) times daily.   Melatonin 10 MG Tabs Take 10 mg by mouth at bedtime.   meropenem 1 g in sodium chloride 0.9 % 100 mL Inject 1 g into the vein every 8 (eight) hours. Discontinue after 01/02/18 doses   metFORMIN 500 MG tablet Commonly known as:  GLUCOPHAGE Take 1,000 mg by mouth 2 (two) times daily with a meal.   PROBIOTIC FORMULA PO Take 1 capsule by mouth daily.   sertraline 50 MG tablet Commonly known as:  ZOLOFT Take 50 mg by mouth 2 (two) times daily.   tamsulosin 0.4 MG Caps capsule Commonly known as:  FLOMAX Take 0.4 mg by mouth every evening.   traZODone 50 MG tablet Commonly known as:  DESYREL TAKE ONE TABLET (50 MG TOTAL) BY MOUTH AT BEDTIME.   Vitamin D (Ergocalciferol) 50000 units Caps capsule Commonly known as:  DRISDOL Take 50,000 Units by mouth every 30 (thirty) days.       Allergies  Allergen Reactions  . Penicillins Rash    Has patient had a PCN reaction causing immediate rash, facial/tongue/throat swelling, SOB or lightheadedness with hypotension: Yes Has  patient had a PCN reaction causing severe rash involving mucus membranes or skin necrosis: No Has patient had a PCN reaction that required hospitalization No Has patient had a PCN reaction occurring within the last 10 years: No If all of the above answers are "NO", then may proceed with Cephalosporin use.     Consultations:  none   Procedures/Studies: Dg Chest Port 1 View  Result Date: 12/26/2017 CLINICAL DATA:  Altered mental status.  Fever. EXAM: PORTABLE CHEST 1 VIEW COMPARISON:  November 25, 2017 FINDINGS: There is mild left base atelectasis. Lungs elsewhere are clear. Heart size and pulmonary vascularity are normal. No adenopathy. No bone lesions. IMPRESSION: Mild left base atelectasis. No edema or consolidation. Stable cardiac silhouette. Electronically Signed   By: Bretta Bang III M.D.   On: 12/26/2017 11:35        Discharge Exam: Vitals:   12/29/17 0236 12/29/17 0628  BP: 118/68 138/69  Pulse: 73 71  Resp: 17 18  Temp: 97.9 F (36.6 C) 98.4 F (36.9 C)  SpO2: 95%    Vitals:   12/28/17 1844 12/28/17 2229 12/29/17 0236 12/29/17 0628  BP: (!) 145/79 127/82 118/68 138/69  Pulse: 76 72 73 71  Resp: 18 18 17 18   Temp: 98 F (36.7 C) 97.7 F (36.5 C) 97.9 F (36.6 C) 98.4 F (36.9 C)  TempSrc:  Oral  Oral Oral  SpO2: 100% 100% 95%   Weight:      Height:        General: Pt is alert, awake, not in acute distress Cardiovascular: RRR, S1/S2 +, no rubs, no gallops Respiratory: CTA bilaterally, no wheezing, no rhonchi Abdominal: Soft, NT, ND, bowel sounds + Extremities: no edema, no cyanosis   The results of significant diagnostics from this hospitalization (including imaging, microbiology, ancillary and laboratory) are listed below for reference.    Significant Diagnostic Studies: Dg Chest Port 1 View  Result Date: 12/26/2017 CLINICAL DATA:  Altered mental status.  Fever. EXAM: PORTABLE CHEST 1 VIEW COMPARISON:  November 25, 2017 FINDINGS: There is mild left base  atelectasis. Lungs elsewhere are clear. Heart size and pulmonary vascularity are normal. No adenopathy. No bone lesions. IMPRESSION: Mild left base atelectasis. No edema or consolidation. Stable cardiac silhouette. Electronically Signed   By: Bretta Bang III M.D.   On: 12/26/2017 11:35     Microbiology: Recent Results (from the past 240 hour(s))  Urine culture     Status: Abnormal   Collection Time: 12/26/17 10:35 AM  Result Value Ref Range Status   Specimen Description   Final    URINE, CATHETERIZED Performed at [facility], [address] [place], [place] [address]    Special Requests NONE  Final   Culture (A)  Final    80,000 COLONIES/mL KLEBSIELLA PNEUMONIAE Confirmed Extended Spectrum Beta-Lactamase Producer (ESBL).  In bloodstream infections from ESBL organisms, carbapenems are preferred over piperacillin/tazobactam. They are shown to have a lower risk of mortality. Performed at [facility], [address], [place], [place] [address]    Report Status 12/28/2017 FINAL  Final   Organism ID, Bacteria KLEBSIELLA PNEUMONIAE (A)  Final      Susceptibility   Klebsiella pneumoniae - MIC*    AMPICILLIN >=32 RESISTANT Resistant     CEFAZOLIN >=64 RESISTANT Resistant     CEFTRIAXONE >=64 RESISTANT Resistant     CIPROFLOXACIN >=4 RESISTANT Resistant     GENTAMICIN >=16 RESISTANT Resistant     IMIPENEM <=0.25 SENSITIVE Sensitive     NITROFURANTOIN 128 RESISTANT Resistant     TRIMETH/SULFA >=320 RESISTANT Resistant     AMPICILLIN/SULBACTAM >=32 RESISTANT Resistant     PIP/TAZO 16 SENSITIVE Sensitive     Extended ESBL POSITIVE Resistant     * 80,000 COLONIES/mL KLEBSIELLA PNEUMONIAE  Culture, blood (Routine x 2)     Status: None (Preliminary result)   Collection Time: 12/26/17 10:55 AM  Result Value Ref Range Status   Specimen Description BLOOD LEFT HAND  Final   Special Requests   Final    BOTTLES DRAWN AEROBIC AND ANAEROBIC Blood Culture adequate volume   Culture    Final    NO GROWTH 3 DAYS Performed at [facility], [address] [place], [place] [address]    Report Status PENDING  Incomplete  Culture, blood (Routine x 2)     Status: Abnormal   Collection Time: 12/26/17 10:57 AM  Result Value Ref Range Status   Specimen Description   Final    BLOOD RIGHT HAND Performed at [facility], [address] [place], [place] [address]    Special Requests   Final    BOTTLES DRAWN AEROBIC AND ANAEROBIC Blood Culture adequate volume Performed at [facility], [address] [place], [place] [address]    Culture  Setup Time   Final    GRAM POSITIVE COCCI AEROBIC BOTTLE ONLY Gram Stain Report  Called to,Read Back By and Verified With: SMART,M@1022  BY MATTHEWS,B 7.6.19 Performed at [facility] CRITICAL RESULT CALLED TO, READ BACK BY AND VERIFIED WITH: S HURTH,PHARMD AT 1359 12/27/17 BY L BENFIELD    Culture (A)  Final    STAPHYLOCOCCUS SPECIES (COAGULASE NEGATIVE) THE SIGNIFICANCE OF ISOLATING THIS ORGANISM FROM A SINGLE SET OF BLOOD CULTURES WHEN MULTIPLE SETS ARE DRAWN IS UNCERTAIN. PLEASE NOTIFY THE MICROBIOLOGY DEPARTMENT WITHIN ONE WEEK IF SPECIATION AND SENSITIVITIES ARE REQUIRED. Performed at [facility], [address] [place], [place] [address]    Report Status 12/29/2017 FINAL  Final  Blood Culture ID Panel (Reflexed)     Status: Abnormal   Collection Time: 12/26/17 10:57 AM  Result Value Ref Range Status   Enterococcus species NOT DETECTED NOT DETECTED Final   Listeria monocytogenes NOT DETECTED NOT DETECTED Final   Staphylococcus species DETECTED (A) NOT DETECTED Final    Comment: Methicillin (oxacillin) resistant coagulase negative staphylococcus. Possible blood culture contaminant (unless isolated from more than one blood culture draw or clinical case suggests pathogenicity). No antibiotic treatment is indicated for blood  culture contaminants. CRITICAL RESULT CALLED TO, READ BACK BY AND VERIFIED WITH: S HURTH,PHARMD AT  1359 12/27/17 BY L BENFIELD    Staphylococcus aureus NOT DETECTED NOT DETECTED Final   Methicillin resistance DETECTED (A) NOT DETECTED Final    Comment: CRITICAL RESULT CALLED TO, READ BACK BY AND VERIFIED WITH: S HURTH,PHARMD AT 1359 12/27/17 BY L BENFIELD    Streptococcus species NOT DETECTED NOT DETECTED Final   Streptococcus agalactiae NOT DETECTED NOT DETECTED Final   Streptococcus pneumoniae NOT DETECTED NOT DETECTED Final   Streptococcus pyogenes NOT DETECTED NOT DETECTED Final   Acinetobacter baumannii NOT DETECTED NOT DETECTED Final   Enterobacteriaceae species NOT DETECTED NOT DETECTED Final   Enterobacter cloacae complex NOT DETECTED NOT DETECTED Final   Escherichia coli NOT DETECTED NOT DETECTED Final   Klebsiella oxytoca NOT DETECTED NOT DETECTED Final   Klebsiella pneumoniae NOT DETECTED NOT DETECTED Final   Proteus species NOT DETECTED NOT DETECTED Final   Serratia marcescens NOT DETECTED NOT DETECTED Final   Haemophilus influenzae NOT DETECTED NOT DETECTED Final   Neisseria meningitidis NOT DETECTED NOT DETECTED Final   Pseudomonas aeruginosa NOT DETECTED NOT DETECTED Final   Candida albicans NOT DETECTED NOT DETECTED Final   Candida glabrata NOT DETECTED NOT DETECTED Final   Candida krusei NOT DETECTED NOT DETECTED Final   Candida parapsilosis NOT DETECTED NOT DETECTED Final   Candida tropicalis NOT DETECTED NOT DETECTED Final    Comment: Performed at [facility], [address] [place], [place] [address]  MRSA PCR Screening     Status: None   Collection Time: 12/26/17  5:52 PM  Result Value Ref Range Status   MRSA by PCR NEGATIVE NEGATIVE Final    Comment:        The GeneXpert MRSA Assay (FDA approved for NASAL specimens only), is one component of a comprehensive MRSA colonization surveillance program. It is not intended to diagnose MRSA infection nor to guide or monitor treatment for MRSA infections. Performed at [facility], [address],  [place], [place] [address]      Labs: Basic Metabolic Panel: Recent Labs  Lab 12/26/17 1040  12/26/17 2252 12/27/17 0445 12/27/17 0908 12/28/17 0450 12/29/17 0506  NA 145   < > 148* 149* 146* 139 141  K 4.3   < > 2.8* 3.0* 3.1* 4.0 3.4*  CL 105   < >  116* 115* 112* 104 106  CO2 19*   < > 24 27 27 28 27   GLUCOSE 499*   < > 199* 158* 157* 261* 151*  BUN 32*   < > 27* 25* 22 15 10   CREATININE 1.07   < > 0.70 0.55* 0.55* 0.56* 0.44*  CALCIUM 8.9   < > 7.8* 7.8* 7.6* 7.8* 7.8*  MG 2.1  --   --  1.8  --  1.7 2.1  PHOS  --   --   --  1.4*  --  1.6*  --    < > = values in this interval not displayed.   Liver Function Tests: Recent Labs  Lab 12/26/17 1040  AST 14*  ALT 18  ALKPHOS 65  BILITOT 1.4*  PROT 8.4*  ALBUMIN 3.9   No results for input(s): LIPASE, AMYLASE in the last 168 hours. No results for input(s): AMMONIA in the last 168 hours. CBC: Recent Labs  Lab 12/26/17 1040 12/27/17 0445 12/28/17 0450 12/29/17 0506  WBC 10.8* 11.8* 10.5 7.0  NEUTROABS 8.6*  --   --   --   HGB 12.2* 10.1* 10.6* 10.3*  HCT 39.0 32.5* 33.9* 32.6*  MCV 92.4 92.1 90.9 88.8  PLT 361 331 309 277   Cardiac Enzymes: No results for input(s): CKTOTAL, CKMB, CKMBINDEX, TROPONINI in the last 168 hours. BNP: Invalid input(s): POCBNP CBG: Recent Labs  Lab 12/28/17 0735 12/28/17 1204 12/28/17 1613 12/28/17 2127 12/29/17 0716  GLUCAP 294* 210* 189* 154* 166*    Time coordinating discharge:  36 minutes  Signed:  Catarina Hartshorn, DO [facility] Pager: [phone] 12/29/2017, 9:22 AM

## 2017-12-28 NOTE — Progress Notes (Addendum)
PROGRESS NOTE  Alan SeedsGlenn A Ambroise Jr. WUJ:[id number] DOB: 1945-07-15 DOA: 12/26/2017 PCP: Miguel AschoffWilliams, Julie Anne, MD  Brief History:  71 y.o.malewith medical history ofcoronary disease, diabetes mellitus, dementia, stroke, and depression presenting with 4-day history of increasing somnolence and decreased oral intake. Apparently, the patient had a Foley catheter placed approximately 2 weeks prior to this admission secondary to urinary retention. He pulled outhis Foley catheter on 12/22/2017.He went back to see urologist on 12/23/2017. Decision was made to keep the catheter out as long as the patient was able to urinate spontaneously. Even with his Foley catheter in place, the patient had complained of continued dysuria. On the morning of 12/24/2017, the patient had had a fever 102.0 F and continued somnolence. On the morning of 12/25/2017, his wife called the [facility],and she was instructed to bring the patient to emergency department for further evaluation. Notably, the patient had been previously on Augmentin for UTI which she finished 1 week prior to this admission. There is been no complaints of headache, neck pain, chest pain, shortness breath, vomiting. He has been having some loose stools. In the emergency department, the patient was febrile up to 103.0 F and tachycardic but remained hemodynamically stable saturating 97% on room air.  Pt was noted to be in DKA with serum glucose of 499.  He was started on IV insulin, IVF, merrrem and vancomycin.   Assessment/Plan: Acute metabolic encephalopathy -Secondary to DKA and infectious process and dehydration -IV fluids and IV antibiotics -improving  -now near baseline per spouse  Sepsis -Present at the time of admission with fever tachycardia -continue meropenem  -d/c vanco -Check procalcitonin <0.10 -Lactic acid peaked 1.7 -IV fluids--add KCl -Follow urine culture--ESBL Klebsiella -Blood culture =  contaminant  UTI--ESBL Klebsiella -continue merrem -place PICC in anticipation of outpt treatment  Dysphagia -Concerned about aspiration pneumonitis -Speech therapy evaluation -Continue meropenem  DKA, type 2 --patient started on IV insulin with q 1 hour CBG check and q 4 hour BMPs -pt started on aggressive fluid resuscitation -Electrolytes were monitored and repleted -transitioned to [place] insulin once anion gap closed -diet was advanced once anion gap closed -HbA1C--11.1  Diabetes mellitus type 2, uncontrolled with hyperglycemia -Increase Lantus 30 units daily -novolog sliding scale--increase to moderate scale  Coronary artery disease -No chest pain presently -Continue aspirin and statin  Loose stools -Check C. Difficile--no further diarrhea to collect -Patient was recently on Augmentin  Hypokalemia/Hypophosphatemia -replete -check mag    Disposition Plan:   Home 7/8 or 7/9 Family Communication:  spouse udated at bedside  Consultants:  none  Code Status:  DNR  DVT Prophylaxis:  [place] Lovenox   Procedures: As Listed in Progress Note Above  Antibiotics: Merrem 12/26/17>>>      Subjective: Pt is more alert today.  He is tolerating his diet but has a poor appetite.  He denies, cp, sob, n/v/d, abd pain.  Objective: Vitals:   12/28/17 0900 12/28/17 1000 12/28/17 1100 12/28/17 1116  BP: (!) 141/84 (!) 107/37 (!) 147/89   Pulse: 74 67 67 64  Resp: (!) 25 (!) 23 (!) 24 20  Temp:    98.1 F (36.7 C)  TempSrc:    Oral  SpO2: 99% 100% 100% 98%  Weight:      Height:        Intake/Output Summary (Last 24 hours) at 12/28/2017 1315 Last data filed at 12/28/2017 0600 Gross per 24 hour  Intake 2050 ml  Output  950 ml  Net 1100 ml   Weight change: 6.732 kg (14 lb 13.5 oz) Exam:   General:  Pt is alert, follows commands appropriately, not in acute distress  HEENT: No icterus, No thrush, No neck mass, /AT  Cardiovascular: RRR, S1/S2, no rubs,  no gallops  Respiratory: CTA bilaterally, no wheezing, no crackles, no rhonchi  Abdomen: Soft/+BS, non tender, non distended, no guarding  Extremities: No edema, No lymphangitis, No petechiae, No rashes, no synovitis   Data Reviewed: I have personally reviewed following labs and imaging studies Basic Metabolic Panel: Recent Labs  Lab 12/26/17 1040 12/26/17 1917 12/26/17 2252 12/27/17 0445 12/27/17 0908 12/28/17 0450  NA 145 149* 148* 149* 146* 139  K 4.3 3.3* 2.8* 3.0* 3.1* 4.0  CL 105 114* 116* 115* 112* 104  CO2 19* 22 24 27 27 28   GLUCOSE 499* 303* 199* 158* 157* 261*  BUN 32* 30* 27* 25* 22 15  CREATININE 1.07 0.88 0.70 0.55* 0.55* 0.56*  CALCIUM 8.9 8.2* 7.8* 7.8* 7.6* 7.8*  MG 2.1  --   --  1.8  --  1.7  PHOS  --   --   --  1.4*  --  1.6*   Liver Function Tests: Recent Labs  Lab 12/26/17 1040  AST 14*  ALT 18  ALKPHOS 65  BILITOT 1.4*  PROT 8.4*  ALBUMIN 3.9   No results for input(s): LIPASE, AMYLASE in the last 168 hours. No results for input(s): AMMONIA in the last 168 hours. Coagulation Profile: Recent Labs  Lab 12/26/17 1040  INR 1.10   CBC: Recent Labs  Lab 12/26/17 1040 12/27/17 0445 12/28/17 0450  WBC 10.8* 11.8* 10.5  NEUTROABS 8.6*  --   --   HGB 12.2* 10.1* 10.6*  HCT 39.0 32.5* 33.9*  MCV 92.4 92.1 90.9  PLT 361 331 309   Cardiac Enzymes: No results for input(s): CKTOTAL, CKMB, CKMBINDEX, TROPONINI in the last 168 hours. BNP: Invalid input(s): POCBNP CBG: Recent Labs  Lab 12/27/17 1210 12/27/17 1641 12/27/17 2124 12/28/17 0735 12/28/17 1204  GLUCAP 167* 199* 232* 294* 210*   HbA1C: Recent Labs    12/26/17 1040  HGBA1C 11.1*   Urine analysis:    Component Value Date/Time   COLORURINE YELLOW 12/26/2017 1035   APPEARANCEUR CLEAR 12/26/2017 1035   LABSPEC 1.028 12/26/2017 1035   PHURINE 5.0 12/26/2017 1035   GLUCOSEU >=500 (A) 12/26/2017 1035   HGBUR NEGATIVE 12/26/2017 1035   BILIRUBINUR NEGATIVE 12/26/2017 1035    KETONESUR 80 (A) 12/26/2017 1035   PROTEINUR 100 (A) 12/26/2017 1035   NITRITE NEGATIVE 12/26/2017 1035   LEUKOCYTESUR NEGATIVE 12/26/2017 1035   Sepsis Labs: @LABRCNTIP (procalcitonin:4,lacticidven:4) ) Recent Results (from the past 240 hour(s))  Urine culture     Status: Abnormal   Collection Time: 12/26/17 10:35 AM  Result Value Ref Range Status   Specimen Description   Final    URINE, CATHETERIZED Performed at [facility], [address], [place], [place] [address]    Special Requests NONE  Final   Culture (A)  Final    80,000 COLONIES/mL KLEBSIELLA PNEUMONIAE Confirmed Extended Spectrum Beta-Lactamase Producer (ESBL).  In bloodstream infections from ESBL organisms, carbapenems are preferred over piperacillin/tazobactam. They are shown to have a lower risk of mortality. Performed at [facility], [address] [place], [place] [address]    Report Status 12/28/2017 FINAL  Final   Organism ID, Bacteria KLEBSIELLA PNEUMONIAE (A)  Final      Susceptibility  Klebsiella pneumoniae - MIC*    AMPICILLIN >=32 RESISTANT Resistant     CEFAZOLIN >=64 RESISTANT Resistant     CEFTRIAXONE >=64 RESISTANT Resistant     CIPROFLOXACIN >=4 RESISTANT Resistant     GENTAMICIN >=16 RESISTANT Resistant     IMIPENEM <=0.25 SENSITIVE Sensitive     NITROFURANTOIN 128 RESISTANT Resistant     TRIMETH/SULFA >=320 RESISTANT Resistant     AMPICILLIN/SULBACTAM >=32 RESISTANT Resistant     PIP/TAZO 16 SENSITIVE Sensitive     Extended ESBL POSITIVE Resistant     * 80,000 COLONIES/mL KLEBSIELLA PNEUMONIAE  Culture, blood (Routine x 2)     Status: None (Preliminary result)   Collection Time: 12/26/17 10:55 AM  Result Value Ref Range Status   Specimen Description BLOOD  Final   Special Requests Normal  Final   Culture   Final    NO GROWTH 2 DAYS Performed at [facility], [address], [place], [place] [address]    Report Status PENDING  Incomplete  Culture, blood (Routine x 2)     Status:  None (Preliminary result)   Collection Time: 12/26/17 10:57 AM  Result Value Ref Range Status   Specimen Description   Final    BLOOD Performed at [facility], [address], [place], [place] [address]    Special Requests   Final    Normal Performed at [facility], [address] [place], [place] [address]    Culture  Setup Time   Final    GRAM POSITIVE COCCI AEROBIC BOTTLE ONLY Gram Stain Report Called to,Read Back By and Verified With: SMART,M@1022  BY MATTHEWS,B 7.6.19 Performed at [facility] CRITICAL RESULT CALLED TO, READ BACK BY AND VERIFIED WITH: S HURTH,PHARMD AT 1359 12/27/17 BY L BENFIELD Performed at [facility], [address] [place], [place] [address]    Culture GRAM POSITIVE COCCI  Final   Report Status PENDING  Incomplete  Blood Culture ID Panel (Reflexed)     Status: Abnormal   Collection Time: 12/26/17 10:57 AM  Result Value Ref Range Status   Enterococcus species NOT DETECTED NOT DETECTED Final   Listeria monocytogenes NOT DETECTED NOT DETECTED Final   Staphylococcus species DETECTED (A) NOT DETECTED Final    Comment: Methicillin (oxacillin) resistant coagulase negative staphylococcus. Possible blood culture contaminant (unless isolated from more than one blood culture draw or clinical case suggests pathogenicity). No antibiotic treatment is indicated for blood  culture contaminants. CRITICAL RESULT CALLED TO, READ BACK BY AND VERIFIED WITH: S HURTH,PHARMD AT 1359 12/27/17 BY L BENFIELD    Staphylococcus aureus NOT DETECTED NOT DETECTED Final   Methicillin resistance DETECTED (A) NOT DETECTED Final    Comment: CRITICAL RESULT CALLED TO, READ BACK BY AND VERIFIED WITH: S HURTH,PHARMD AT 1359 12/27/17 BY L BENFIELD    Streptococcus species NOT DETECTED NOT DETECTED Final   Streptococcus agalactiae NOT DETECTED NOT DETECTED Final   Streptococcus pneumoniae NOT DETECTED NOT DETECTED Final   Streptococcus pyogenes NOT DETECTED NOT DETECTED Final    Acinetobacter baumannii NOT DETECTED NOT DETECTED Final   Enterobacteriaceae species NOT DETECTED NOT DETECTED Final   Enterobacter cloacae complex NOT DETECTED NOT DETECTED Final   Escherichia coli NOT DETECTED NOT DETECTED Final   Klebsiella oxytoca NOT DETECTED NOT DETECTED Final   Klebsiella pneumoniae NOT DETECTED NOT DETECTED Final   Proteus species NOT DETECTED NOT DETECTED Final   Serratia marcescens NOT DETECTED NOT DETECTED Final   Haemophilus influenzae NOT DETECTED NOT DETECTED Final  Neisseria meningitidis NOT DETECTED NOT DETECTED Final   Pseudomonas aeruginosa NOT DETECTED NOT DETECTED Final   Candida albicans NOT DETECTED NOT DETECTED Final   Candida glabrata NOT DETECTED NOT DETECTED Final   Candida krusei NOT DETECTED NOT DETECTED Final   Candida parapsilosis NOT DETECTED NOT DETECTED Final   Candida tropicalis NOT DETECTED NOT DETECTED Final    Comment: Performed at [facility], [address] [place], [place] [address]  MRSA PCR Screening     Status: None   Collection Time: 12/26/17  5:52 PM  Result Value Ref Range Status   MRSA by PCR NEGATIVE NEGATIVE Final    Comment:        The GeneXpert MRSA Assay (FDA approved for NASAL specimens only), is one component of a comprehensive MRSA colonization surveillance program. It is not intended to diagnose MRSA infection nor to guide or monitor treatment for MRSA infections. Performed at [facility], [address], [place], [place] [address]      Scheduled Meds: . acetaminophen  650 mg Oral Q6H  . aspirin EC  81 mg Oral q morning - 10a  . atorvastatin  10 mg Oral QHS  . enoxaparin (LOVENOX) injection  40 mg Subcutaneous Q24H  . insulin aspart  0-15 Units Subcutaneous TID WC  . insulin aspart  0-5 Units Subcutaneous QHS  . insulin glargine  10 Units Subcutaneous Once  . [START ON 12/29/2017] insulin glargine  30 Units Subcutaneous Daily  . phosphorus  250 mg Oral TID  . sertraline  50 mg Oral BID  .  tamsulosin  0.4 mg Oral QPM   Continuous Infusions: . 0.45 % NaCl with KCl 20 mEq / L 100 mL/hr at 12/28/17 1030  . magnesium sulfate 1 - 4 g bolus IVPB    . meropenem (MERREM) IV Stopped (12/28/17 1140)    Procedures/Studies: Dg Chest Port 1 View  Result Date: 12/26/2017 CLINICAL DATA:  Altered mental status.  Fever. EXAM: PORTABLE CHEST 1 VIEW COMPARISON:  November 25, 2017 FINDINGS: There is mild left base atelectasis. Lungs elsewhere are clear. Heart size and pulmonary vascularity are normal. No adenopathy. No bone lesions. IMPRESSION: Mild left base atelectasis. No edema or consolidation. Stable cardiac silhouette. Electronically Signed   By: Bretta Bang III M.D.   On: 12/26/2017 11:35    Catarina Hartshorn, DO  [facility] Pager [phone]  If 7PM-7AM, please contact night-coverage [email or web address] Password [email or web address] 12/28/2017, 1:15 PM   LOS: 2 days

## 2017-12-28 NOTE — Progress Notes (Signed)
Spoke with patient's nurse concerning urgency to have PICC placed. Informed her that we would not be able to place PICC today, but would be able tomorrow morning. If PICC placement could not wait until that time, [facility] was to be called to place. Nurse spoke with primary, who wanted the PICC placed today, and [facility] is going to place PICC.

## 2017-12-29 LAB — BASIC METABOLIC PANEL
Anion gap: 8 (ref 5–15)
BUN: 10 mg/dL (ref 8–23)
CALCIUM: 7.8 mg/dL — AB (ref 8.9–10.3)
CHLORIDE: 106 mmol/L (ref 98–111)
CO2: 27 mmol/L (ref 22–32)
Creatinine, Ser: 0.44 mg/dL — ABNORMAL LOW (ref 0.61–1.24)
GFR calc non Af Amer: >60 mL/min (ref >60)
Glucose, Bld: 151 mg/dL — ABNORMAL HIGH (ref 70–99)
Potassium: 3.4 mmol/L — ABNORMAL LOW (ref 3.5–5.1)
SODIUM: 141 mmol/L (ref 135–145)

## 2017-12-29 LAB — CBC
HCT: 32.6 % — ABNORMAL LOW (ref 39.0–52.0)
Hemoglobin: 10.3 g/dL — ABNORMAL LOW (ref 13.0–17.0)
MCH: 28.1 pg (ref 26.0–34.0)
MCHC: 31.6 g/dL (ref 30.0–36.0)
MCV: 88.8 fL (ref 78.0–100.0)
PLATELETS: 277 10*3/uL (ref 150–400)
RBC: 3.67 MIL/uL — AB (ref 4.22–5.81)
RDW: 12.9 % (ref 11.5–15.5)
WBC: 7 10*3/uL (ref 4.0–10.5)

## 2017-12-29 LAB — CULTURE, BLOOD (ROUTINE X 2): SPECIAL REQUESTS: ADEQUATE

## 2017-12-29 LAB — GLUCOSE, CAPILLARY
Glucose-Capillary: 166 mg/dL — ABNORMAL HIGH (ref 70–99)
Glucose-Capillary: 276 mg/dL — ABNORMAL HIGH (ref 70–99)

## 2017-12-29 LAB — PHOSPHORUS: Phosphorus: 3 mg/dL (ref 2.5–4.6)

## 2017-12-29 LAB — MAGNESIUM: MAGNESIUM: 2.1 mg/dL (ref 1.7–2.4)

## 2017-12-29 MED ORDER — POTASSIUM CHLORIDE CRYS ER 20 MEQ PO TBCR
20.0000 meq | EXTENDED_RELEASE_TABLET | Freq: Once | ORAL | Status: AC
  Administered 2017-12-29: 20 meq via ORAL
  Filled 2017-12-29: qty 1

## 2017-12-29 MED ORDER — SODIUM CHLORIDE 0.9 % IV SOLN: 1.0000 g | Freq: Three times a day (TID) | INTRAVENOUS | 0 refills | Status: DC

## 2017-12-29 NOTE — [place] FL2 (Signed)
[place] MEDICAID FL2 LEVEL OF CARE SCREENING TOOL     IDENTIFICATION  Patient Name: Alan [name]. Birthdate: 03-02-1946 Sex: male Admission Date (Current Location): 12/26/2017  [facility] and [place] Number:  [employer] and Address:  [facility],  [address], [place] [address]      Provider Number: [id number]  Attending Physician Name and Address:  Catarina Hartshorn, MD  Relative Name and Phone Number:       Current Level of Care: Hospital Recommended Level of Care: Skilled Nursing Facility Prior Approval Number:    Date Approved/Denied:   PASRR Number: [id number])  Discharge Plan: SNF    Current Diagnoses: Patient Active Problem List   Diagnosis Date Noted  . Klebsiella sepsis (HCC) 12/28/2017  . Uncontrolled type 2 diabetes mellitus with hyperglycemia, with long-term current use of insulin (HCC) 12/27/2017  . DKA, type 2 (HCC) 12/26/2017  . Sepsis due to undetermined organism (HCC) 12/26/2017  . 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 Urinary tract infection associated with nephrostomy catheter (HCC) 07/07/2017  . Acute urinary retention 07/07/2017  . Acute metabolic encephalopathy 07/07/2017  . Nephrostomy status (HCC)   . Ureteral calculus, left   . Urinary tract infection due to Klebsiella species   . Sepsis (HCC) 07/02/2017  . Candida tropicalis infection 06/20/2017  . Bilateral nephrolithiasis 06/20/2017  . History of nephrostomy   . Candidemia (HCC) 06/15/2017  . Obstruction of kidney   . 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    Orientation RESPIRATION BLADDER Height & Weight     Self  Normal Incontinent Weight: 172 lb 13.5 oz (78.4 kg) Height:  5\' 10"  (177.8 cm)  BEHAVIORAL SYMPTOMS/MOOD NEUROLOGICAL BOWEL NUTRITION STATUS      Incontinent Diet(heart healthy/carb modified)  AMBULATORY STATUS COMMUNICATION OF  NEEDS Skin   Extensive Assist Verbally Normal                       Personal Care Assistance Level of Assistance  Bathing, Feeding, Dressing Bathing Assistance: Maximum assistance Feeding assistance: Limited assistance Dressing Assistance: Maximum assistance     Functional Limitations Info  Sight, Hearing, Speech Sight Info: Adequate Hearing Info: Adequate Speech Info: Adequate    SPECIAL CARE FACTORS FREQUENCY  PT (By licensed PT)     PT Frequency: 5x/week              Contractures Contractures Info: Not present    Additional Factors Info  Allergies, Psychotropic, Isolation Precautions   Allergies Info: DNR Psychotropic Info: Desyrel   Isolation Precautions Info: ESBL urine culture 7.5.2019     Current Medications (12/29/2017):  This is the current hospital active medication list Current Facility-Administered Medications  Medication Dose Route Frequency Provider Last Rate Last Dose  . 0.45 % NaCl with KCl 20 mEq / L infusion   Intravenous Continuous Tat, David, MD 100 mL/hr at 12/29/17 0001    . acetaminophen (TYLENOL) tablet 650 mg  650 mg Oral Q6H Catarina Hartshorn, MD   650 mg at 12/29/17 0001  . aspirin EC tablet 81 mg  81 mg Oral q morning - 10a Catarina Hartshorn, MD   81 mg at 12/29/17 1026  . atorvastatin (LIPITOR) tablet 10 mg  10 mg Oral Maura Crandall, MD   10 mg at 12/28/17 2154  . enoxaparin (LOVENOX) injection 40 mg  40 mg Subcutaneous Q24H Catarina Hartshorn, MD  40 mg at 12/28/17 1717  . insulin aspart (novoLOG) injection 0-15 Units  0-15 Units Subcutaneous TID WC Catarina Hartshornat, David, MD   8 Units at 12/29/17 1202  . insulin aspart (novoLOG) injection 0-5 Units  0-5 Units Subcutaneous QHS Tat, David, MD      . insulin glargine (LANTUS) injection 30 Units  30 Units Subcutaneous Daily Tat, David, MD   30 Units at 12/29/17 1019  . ketorolac (TORADOL) 15 MG/ML injection 15 mg  15 mg Intravenous Q6H PRN Catarina Hartshornat, David, MD   15 mg at 12/29/17 1222  . meropenem (MERREM) 1 g in sodium  chloride 0.9 % 100 mL IVPB  1 g Intravenous Andres LabrumQ8H Tat, David, MD   Stopped at 12/29/17 1047  . phosphorus (K PHOS NEUTRAL) tablet 250 mg  250 mg Oral TID Catarina Hartshornat, David, MD   250 mg at 12/29/17 1030  . sertraline (ZOLOFT) tablet 50 mg  50 mg Oral BID Tat, Onalee Huaavid, MD   50 mg at 12/29/17 1026  . tamsulosin (FLOMAX) capsule 0.4 mg  0.4 mg Oral QPM Catarina Hartshornat, David, MD   0.4 mg at 12/28/17 1717     Discharge Medications: Please see discharge summary for a list of discharge medications.  Relevant Imaging Results:  Relevant Lab Results:   Additional Information SS# [id number]   Needs IV meropenem 1 g in sodium chloride 0.9 % 100 mL. Inject 1 g into the vein every 8 (eight) hours. Discontinue after 01/02/18 doses  Taisa Deloria, Juleen ChinaHeather D, LCSW

## 2017-12-29 NOTE — Care Management (Signed)
[facility]  Notified Heather, CSW at [facility] with [facility]. CM will sign off.

## 2017-12-29 NOTE — Evaluation (Signed)
Physical Therapy Evaluation Patient Details Name: Alan [name]. MRN: [id number] DOB: April 03, 1946 Today's Date: 12/29/2017   History of Present Illness  Alan [name]. is a 72 y.o. male with medical history of coronary disease, diabetes mellitus, dementia, stroke, and depression presenting with 4-day history of increasing somnolence and decreased oral intake.  The patient is unable to provide any history.  All his history is obtained from review of the medical record and speaking with the patient's wife.  Apparently, the patient had a Foley catheter placed approximately 2 weeks prior to this admission secondary to urinary retention.  He pulled out his Foley catheter on 12/22/2017.  He went back to see urologist on 12/23/2017.  Decision was made to keep the catheter out as long as the patient was able to urinate spontaneously.  Even with his Foley catheter in place, the patient had complained of continued dysuria.  On the morning of 12/24/2017, the patient had had a fever 1 to 2.0 F and continued somnolence.  On the morning of 12/25/2017, his wife called the [facility], and she was instructed to bring the patient to emergency department for further evaluation.  Notably, the patient had been previously on Augmentin for UTI which she finished 1 week prior to this admission.  There is been no complaints of headache, neck pain, chest pain, shortness breath, vomiting.  He has been having some loose stools.    Clinical Impression  Patient presents with Left BKA and unable to maintain standing balance or take steps using RW due to RLE weakness, required Max assist to scoot laterally at bedside and required stand pivot to transfer to chair and tolerated sitting up in chair after therapy - nursing staff notified.  Patient will benefit from continued physical therapy in hospital and recommended venue below to increase strength, balance, endurance for safe ADLs and gait.    Follow Up Recommendations  SNF;Supervision/Assistance - 24 hour    Equipment Recommendations  None recommended by PT    Recommendations for Other Services       Precautions / Restrictions Precautions Precautions: Fall Restrictions Weight Bearing Restrictions: No      Mobility  Bed Mobility Overal bed mobility: Needs Assistance Bed Mobility: Supine to Sit     Supine to sit: Min assist        Transfers Overall transfer level: Needs assistance Equipment used: Rolling walker (2 wheeled);1 person hand held assist Transfers: Sit to/from [employer] Sit to Stand: Max assist Stand pivot transfers: Max assist       General transfer comment: patient unable to take steps or transfer using RW due to weakness  Ambulation/Gait                Stairs            Wheelchair Mobility    Modified Rankin (Stroke Patients Only)       Balance Overall balance assessment: Needs assistance Sitting-balance support: Feet supported;No upper extremity supported Sitting balance-Leahy Scale: Fair     Standing balance support: Bilateral upper extremity supported;During functional activity Standing balance-Leahy Scale: Poor Standing balance comment: using RW                             Pertinent Vitals/Pain Pain Assessment: No/denies pain    Home Living   Living Arrangements: Spouse/significant other Available Help at Discharge: Family;Available 24 hours/day;Other (Comment)(in [facility] program) Type of Home: House Home Access: Ramped  entrance     Home Layout: One level Home Equipment: Walker - 2 wheels;Bedside commode;Tub bench;Wheelchair - Careers advisermanual;Other (comment);Hospital bed Additional Comments: [facility] program participant    Prior Function Level of Independence: Needs assistance   Gait / Transfers Assistance Needed: non ambulatory, assisted for transfers  ADL's / Homemaking Assistance Needed: assisted by spouse and [facility]ACE program staff        Hand Dominance    Dominant Hand: Right    Extremity/Trunk Assessment   Upper Extremity Assessment Upper Extremity Assessment: RUE deficits/detail;LUE deficits/detail RUE Deficits / Details: grossly -4/5 LUE Deficits / Details: grossly 2+/5    Lower Extremity Assessment Lower Extremity Assessment: RLE deficits/detail;LLE deficits/detail RLE Deficits / Details: grossly -3/5 LLE Deficits / Details: grossly -3/5    Cervical / Trunk Assessment Cervical / Trunk Assessment: Normal  Communication   Communication: No difficulties  Cognition Arousal/Alertness: Awake/alert Behavior During Therapy: WFL for tasks assessed/performed Overall Cognitive Status: No family/caregiver present to determine baseline cognitive functioning                                        General Comments      Exercises     Assessment/Plan    PT Assessment Patient needs continued PT services  PT Problem List Decreased strength;Decreased activity tolerance;Decreased balance;Decreased mobility       PT Treatment Interventions Gait training;Functional mobility training;Therapeutic activities;Therapeutic exercise;Patient/family education    PT Goals (Current goals can be found in the Care Plan section)  Acute Rehab PT Goals Patient Stated Goal: return home with assist PT Goal Formulation: With patient Time For Goal Achievement: 01/12/18 Potential to Achieve Goals: Good    Frequency Min 3X/week   Barriers to discharge        Co-evaluation               AM-PAC PT "6 Clicks" Daily Activity  Outcome Measure Difficulty turning over in bed (including adjusting bedclothes, sheets and blankets)?: A Little Difficulty moving from lying on back to sitting on the side of the bed? : A Little Difficulty sitting down on and standing up from a chair with arms (e.g., wheelchair, bedside commode, etc,.)?: A Lot Help needed moving to and from a bed to chair (including a wheelchair)?: A Lot Help needed walking  in hospital room?: Total Help needed climbing 3-5 steps with a railing? : Total 6 Click Score: 12    End of Session   Activity Tolerance: Patient tolerated treatment well;Patient limited by fatigue Patient left: in chair;with call bell/phone within reach;with chair alarm set Nurse Communication: Mobility status;Other (comment)(nursing staff notified patient left up in chair) PT Visit Diagnosis: Unsteadiness on feet (R26.81);Other abnormalities of gait and mobility (R26.89);Muscle weakness (generalized) (M62.81)    Time: [id number] PT Time Calculation (min) (ACUTE ONLY): 33 min   Charges:   PT Evaluation $PT Eval Moderate Complexity: 1 Mod PT Treatments $Therapeutic Activity: 23-37 mins   PT G Codes:        12:02 PM, 12/29/17 Ocie BobJames Tennelle Taflinger, MPT Physical Therapist with [facility] 336 [phone] office [phone] mobile phone

## 2017-12-29 NOTE — Care Management (Signed)
Discussed with [facility] need for IV antibiotics at home.  Patient may need SNF placement.  [facility]

## 2017-12-29 NOTE — Progress Notes (Signed)
Transporter from [facility] her to pick up patient for transport.  Transporter [facility] to [facility] [place].  RN called SW, Herbert SetaHeather, who verified that patient is being [facility] and that papers have been sent electronically.  Also verified that patient's family is aware.  Patient's IV sites (2) to left removed.  Sites WNL.  Packet sent with transported (AVS, prescriptions, DNR, MOST).  PICC line WNL at discharge.  Called report to nurse at [facility], [place] for room 324.  Patient stable at time of discharge.

## 2017-12-29 NOTE — Plan of Care (Signed)
  Problem: Acute Rehab PT Goals(only PT should resolve) Goal: Pt Will Go Supine/Side To Sit Outcome: Progressing Flowsheets (Taken 12/29/2017 1204) Pt will go Supine/Side to Sit: with min guard assist Goal: Patient Will Transfer Sit To/From Stand Outcome: Progressing Flowsheets (Taken 12/29/2017 1204) Patient will transfer sit to/from stand: with moderate assist Goal: Pt Will Transfer Bed To Chair/Chair To Bed Outcome: Progressing Flowsheets (Taken 12/29/2017 1204) Pt will Transfer Bed to Chair/Chair to Bed: with mod assist   12:05 PM, 12/29/17 Ocie BobJames Leondra Cullin, MPT Physical Therapist with [facility] 336 [phone] office [phone] mobile phone

## 2017-12-29 NOTE — Clinical Social Work Placement (Signed)
   CLINICAL SOCIAL WORK PLACEMENT  NOTE  Date:  12/29/2017  Patient Details  Name: Alan SeedsGlenn A Brazzel Jr. MRN: [id number] Date of Birth: Dec 17, 1945  Clinical Social Work is seeking post-discharge placement for this patient at the Skilled  Nursing Facility level of care (*CSW will initial, date and re-position this form in  chart as items are completed):  Yes   Patient/family provided with [facility] Clinical Social Work Department's list of facilities offering this level of care within the geographic area requested by the patient (or if unable, by the patient's family).  Yes   Patient/family informed of their freedom to choose among providers that offer the needed level of care, that participate in Medicare, Medicaid or managed care program needed by the patient, have an available bed and are willing to accept the patient.  Yes   Patient/family informed of [facility]'s ownership interest in [facility] and [facility], as well as of the fact that they are under no obligation to receive care at these facilities.  PASRR submitted to EDS on       PASRR number received on       Existing PASRR number confirmed on 12/29/17     FL2 transmitted to all facilities in geographic area requested by pt/family on 12/29/17     FL2 transmitted to all facilities within larger geographic area on       Patient informed that his/her managed care company has contracts with or will negotiate with certain facilities, including the following:        Yes   Patient/family informed of bed offers received.  [facility]     Physician recommends and patient chooses bed at      Patient to be transferred [facility] on 12/29/17.  [facility] be [facility] to [facility]     Patient family notified on 12/29/17 of transfer.  Name of family member notified:  spouse     PHYSICIAN       Additional Comment:     _______________________________________________ Annice NeedySettle, Lysette Lindenbaum D, LCSW 12/29/2017, 4:17 PM

## 2017-12-31 LAB — CULTURE, BLOOD (ROUTINE X 2)
CULTURE: NO GROWTH
Special Requests: ADEQUATE

## 2018-02-03 ENCOUNTER — Ambulatory Visit (INDEPENDENT_AMBULATORY_CARE_PROVIDER_SITE_OTHER): Payer: Medicare (Managed Care) | Admitting: Family

## 2018-02-03 ENCOUNTER — Ambulatory Visit (HOSPITAL_COMMUNITY)
Admission: RE | Admit: 2018-02-03 | Discharge: 2018-02-03 | Disposition: A | Discharge status: Home / Self Care | Payer: Medicare (Managed Care) | Source: Ambulatory Visit | Attending: Family | Admitting: Family

## 2018-02-03 ENCOUNTER — Encounter: Payer: Self-pay | Admitting: Family

## 2018-02-03 ENCOUNTER — Other Ambulatory Visit: Payer: Self-pay

## 2018-02-03 VITALS — BP 129/84 | HR 79 | Temp 97.8°F | Resp 16 | Ht 70.0 in | Wt 172.0 lb

## 2018-02-03 DIAGNOSIS — E1151 Type 2 diabetes mellitus with diabetic peripheral angiopathy without gangrene: Secondary | ICD-10-CM

## 2018-02-03 DIAGNOSIS — Z89512 Acquired absence of left leg below knee: Secondary | ICD-10-CM

## 2018-02-03 DIAGNOSIS — I70209 Unspecified atherosclerosis of native arteries of extremities, unspecified extremity: Secondary | ICD-10-CM | POA: Diagnosis not present

## 2018-02-03 DIAGNOSIS — Z89431 Acquired absence of right foot: Secondary | ICD-10-CM | POA: Diagnosis not present

## 2018-02-03 DIAGNOSIS — I779 Disorder of arteries and arterioles, unspecified: Secondary | ICD-10-CM

## 2018-02-03 DIAGNOSIS — Z87891 Personal history of nicotine dependence: Secondary | ICD-10-CM | POA: Diagnosis not present

## 2018-02-03 DIAGNOSIS — E1165 Type 2 diabetes mellitus with hyperglycemia: Secondary | ICD-10-CM

## 2018-02-03 DIAGNOSIS — IMO0002 Reserved for concepts with insufficient information to code with codable children: Secondary | ICD-10-CM

## 2018-02-03 NOTE — Progress Notes (Addendum)
[facility] [place]   CC: Follow up peripheral artery occlusive disease  History of Present Illness Alan Pickup. is a 72 y.o. male who is s/p right femoral endarterectomy 10/30/2016 by Dr. Darrick Penna. 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 He also has a left below-knee amputation that was done at [facility] in the remote past.  Dr. Darrick Penna last evaluated pt on 11-28-16. At that time right groin well-healed no drainage no erythema Right transmetatarsal amputation with 2.0 x 1.5 cm open wound in the central section less than 1 mm diameter 100% granulation tissue. Healing right transmetatarsal amputation wound. Doing well status post right femoral endarterectomy.  The patient was to follow-up with our nurse practitioner in 6 months with repeat ABIs at that point.   He was discharged from the wound care center, transmetatarsal wound has completely healed. He is in the [facility] program and receives daily physical therapy. His wife was with him at a previous visit, stated he has dementia and is very hard of hearting.  Pt is bright and alert, responds appropriately and follows the speaker.  He had a urostomy at some point, this has been removed, has a hx of nephrolithiasis.   He had a stroke, has left UE weakness and some muscle atrophy, has a hx of ETOH abuse in his documented past medical history.   Wife reported that he "broke his back about 2015" from falling.     Diabetic: Yes, last A1C result on file was 11.1 on 12-26-17, uncontrolled, worsening, was 9.5 Tobacco use: former smoker, quit October 2017  Pt meds include: Statin :Yes Betablocker: No ASA: Yes Other anticoagulants/antiplatelets: no   Past Medical History:  Diagnosis Date  . Anemia   . Arthritis   . Dementia   . Diabetes mellitus without complication (HCC)   . ETOH abuse   . Myocardial infarction (HCC)   . Peripheral arterial disease (HCC)   . Stroke (HCC)    . Wears dentures   . Wears glasses     Social History Social History   Tobacco Use  . Smoking status: Former Smoker    Types: Cigarettes  . Smokeless tobacco: Never Used  . Tobacco comment: Quit smoking cigarettes 10/ 2017  Substance Use Topics  . Alcohol use: No    Comment: former  . Drug use: No    Family History Family History  Problem Relation Age of Onset  . Diabetes Mother   . Heart disease Father   . Heart disease Brother     Past Surgical History:  Procedure Laterality Date  . ABDOMINAL AORTOGRAM N/A 10/25/2016   Procedure: Abdominal Aortogram;  Surgeon: Sherren Kerns, MD;  Location: [facility] INVASIVE CV LAB;  Service: Cardiovascular;  Laterality: N/A;  . BRAIN SURGERY    . CORONARY STENT PLACEMENT     " about 25 years ago (10/29/16)  . ENDARTERECTOMY FEMORAL Right 10/30/2016   Procedure: Eduardo Osier WITH PATCH GRAFT;  Surgeon: Sherren Kerns, MD;  Location: [facility] OR;  Service: Vascular;  Laterality: Right;  . IR NEPHRO TUBE REMOV/FL  07/04/2017  . IR NEPHRO TUBE REMOV/FL  07/07/2017  . IR NEPHROSTOMY EXCHANGE LEFT  06/20/2017  . IR NEPHROSTOMY EXCHANGE RIGHT  06/20/2017  . IR NEPHROSTOMY PLACEMENT LEFT  06/12/2017  . IR NEPHROSTOMY PLACEMENT RIGHT  06/12/2017  . IR URETERAL STENT PLACEMENT EXISTING ACCESS LEFT  06/20/2017  . IR URETERAL STENT PLACEMENT EXISTING ACCESS RIGHT  06/20/2017  .  LOWER EXTREMITY ANGIOGRAPHY Right 10/25/2016   Procedure: Lower Extremity Angiography;  Surgeon: Sherren Kerns, MD;  Location: [facility] INVASIVE CV LAB;  Service: Cardiovascular;  Laterality: Right;    Allergies  Allergen Reactions  . Penicillins Rash    Has patient had a PCN reaction causing immediate rash, facial/tongue/throat swelling, SOB or lightheadedness with hypotension: Yes Has patient had a PCN reaction causing severe rash involving mucus membranes or skin necrosis: No Has patient had a PCN reaction that required hospitalization No Has patient had a PCN  reaction occurring within the last 10 years: No If all of the above answers are "NO", then may proceed with Cephalosporin use.     Current Outpatient Medications  Medication Sig Dispense Refill  . acetaminophen (TYLENOL) 650 MG CR tablet Take 650 mg by mouth 3 (three) times daily.     Marland Kitchen alendronate (FOSAMAX) 70 MG tablet Take 70 mg by mouth once a week. Take with a full glass of water on an empty stomach.    . ASPERCREME LIDOCAINE EX Apply topically.    Marland Kitchen aspirin EC 81 MG tablet Take 81 mg by mouth every morning.     Marland Kitchen atorvastatin (LIPITOR) 10 MG tablet Take 10 mg by mouth at bedtime.     . food thickener (THICK-IT #2) POWD Take by mouth as needed.    Marland Kitchen GLUCERNA (GLUCERNA) LIQD Take 1 Can by mouth 2 (two) times daily between meals.    . insulin glargine (LANTUS) 100 UNIT/ML injection Inject 50 Units into the skin at bedtime.     Marland Kitchen LORazepam (ATIVAN) 1 MG tablet Take 0.5 tablets (0.5 mg total) by mouth 2 (two) times daily. 2 tablet 0  . Melatonin 10 MG TABS Take 10 mg by mouth at bedtime.    . metFORMIN (GLUCOPHAGE) 500 MG tablet Take 1,000 mg by mouth 2 (two) times daily with a meal.    . sertraline (ZOLOFT) 50 MG tablet Take 50 mg by mouth 2 (two) times daily.     . Skin Protectants, Misc. (EUCERIN) cream Apply topically as needed for dry skin.    . tamsulosin (FLOMAX) 0.4 MG CAPS capsule Take 0.4 mg by mouth every evening.    . traZODone (DESYREL) 50 MG tablet TAKE ONE TABLET (50 MG TOTAL) BY MOUTH AT BEDTIME.  2  . Vitamin D, Ergocalciferol, (DRISDOL) 50000 units CAPS capsule Take 50,000 Units by mouth every 30 (thirty) days.    Marland Kitchen zinc oxide (BALMEX) 11.3 % CREA cream Apply 1 application topically 2 (two) times daily.    . Bacillus Coagulans-Inulin (PROBIOTIC FORMULA PO) Take 1 capsule by mouth daily.    . meropenem 1 g in sodium chloride 0.9 % 100 mL Inject 1 g into the vein every 8 (eight) hours. Discontinue after 01/02/18 doses (Patient not taking: Reported on 02/03/2018) 13 vial 0    No current facility-administered medications for this visit.     ROS: See HPI for pertinent positives and negatives.   Physical Examination  Vitals:   02/03/18 1055  BP: 129/84  Pulse: 79  Resp: 16  Temp: 97.8 F (36.6 C)  TempSrc: Oral  SpO2: 96%  Weight: 172 lb (78 kg)  Height: 5\' 10"  (1.778 m)   Body mass index is 24.68 kg/m.    General: A&O x 3, male. Gait: seated in a wheelchair  HENT: No gross abnormalities.  Eyes: PERRLA. Pulmonary: Respirations are non labored, CTAB, fair air movement in all fields Cardiac: regular rhythm, no detected murmur.  Carotid Bruits Right Left   Negative Negative   Radial pulses are 1+ palpable bilaterally   Adominal aortic pulse is not palpable                         VASCULAR EXAM: Extremities without ischemic changes, without Gangrene; without open wounds. Healed right TMA, no heel ulcers. Healed left BKA.                                                                                                                                                        LE Pulses Right Left       FEMORAL  not palpable seated in w/c  not palpable        POPLITEAL  1+ palpable   1+ palpable       POSTERIOR TIBIAL  not palpable   BKA        DORSALIS PEDIS      ANTERIOR TIBIAL 1+palpable  BKA    Abdomen: soft, NT, no palpable masses. Wearing a diaper.  Skin: no rashes, no cellulitis, no ulcers noted. Musculoskeletal: + muscle wasting and atrophy left UE.             Neurologic: A&O X 3; appropriate affect, Sensation is normal; MOTOR FUNCTION:  moving all extremitiesun equally, motor strength 5/5 at right upper and lower extremities, and left LE, 3/5 in left UE. Speech is edentulous. CN 2-12 intact except is slightly hard of hearing with hearing aids in place.  Psychiatric: Thought content is normal, mood appropriate for clinical situation.      ASSESSMENT: Alan SeedsGlenn A Double Jr. is a 72 y.o. male who is s/p right  femoral endarterectomy 10/30/2016 by Dr. Darrick PennaFields. 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 He also has a left below-knee amputation that was done at [facility] in the remote past.  Right TMA wound has healed completely,  Left BKA stump is well healed.  Pt is confined to w/c, is in the [facility] program.    DATA  ABI (Date: 02/03/2018):  R:   ABI: 1.31 (was 0.83 on 08-08-17),   PT: tri (was mono)  DP: bi (was mono)  TBI:  S/p TMA   L: BKA Improved perfusion of right LE from 83% to 100%. Waveforms improved from mono to tri and biphasic.     PLAN:  Based on the patient's vascular studies and examination, pt will return to clinic in 6 months with right ABI.   I discussed in depth with the patient the nature of atherosclerosis, and emphasized the importance of maximal medical management including strict control of blood pressure, blood glucose, and lipid levels, obtaining regular exercise, and continued cessation of smoking.  The patient is aware that without maximal medical management the underlying atherosclerotic  disease process will progress, limiting the benefit of any interventions.  The patient was given information about PAD including signs, symptoms, treatment, what symptoms should prompt the patient to seek immediate medical care, and risk reduction measures to take.  Charisse MarchSuzanne Nickel, RN, MSN, FNP-C [facility] [place] Office Phone: [phone]  Clinic MD: Early  02/03/18 11:30 AM

## 2018-02-03 NOTE — Patient Instructions (Signed)

## 2018-02-04 ENCOUNTER — Ambulatory Visit: Payer: Medicare (Managed Care) | Admitting: Family

## 2018-02-04 ENCOUNTER — Encounter (HOSPITAL_COMMUNITY): Payer: Medicare (Managed Care)

## 2018-02-25 ENCOUNTER — Other Ambulatory Visit: Payer: Self-pay

## 2018-02-25 DIAGNOSIS — M6281 Muscle weakness (generalized): Secondary | ICD-10-CM | POA: Diagnosis not present

## 2018-02-25 DIAGNOSIS — F0390 Unspecified dementia without behavioral disturbance: Secondary | ICD-10-CM | POA: Diagnosis not present

## 2018-02-25 DIAGNOSIS — I2510 Atherosclerotic heart disease of native coronary artery without angina pectoris: Secondary | ICD-10-CM | POA: Diagnosis not present

## 2018-02-25 DIAGNOSIS — R1312 Dysphagia, oropharyngeal phase: Secondary | ICD-10-CM | POA: Diagnosis not present

## 2018-02-25 DIAGNOSIS — R2689 Other abnormalities of gait and mobility: Secondary | ICD-10-CM | POA: Diagnosis not present

## 2018-02-25 DIAGNOSIS — E11610 Type 2 diabetes mellitus with diabetic neuropathic arthropathy: Secondary | ICD-10-CM | POA: Diagnosis not present

## 2018-02-25 MED ORDER — LORAZEPAM 1 MG PO TABS: 0.5000 mg | ORAL_TABLET | Freq: Two times a day (BID) | ORAL | 0 refills | Status: DC

## 2018-02-25 NOTE — Telephone Encounter (Signed)
Hard script written by Monina Medina-Vargas, NP 

## 2018-02-26 ENCOUNTER — Encounter: Payer: Self-pay | Admitting: Internal Medicine

## 2018-02-26 ENCOUNTER — Non-Acute Institutional Stay (SKILLED_NURSING_FACILITY): Payer: Medicare Other | Admitting: Internal Medicine

## 2018-02-26 DIAGNOSIS — A414 Sepsis due to anaerobes: Secondary | ICD-10-CM | POA: Diagnosis not present

## 2018-02-26 DIAGNOSIS — R1310 Dysphagia, unspecified: Secondary | ICD-10-CM | POA: Diagnosis not present

## 2018-02-26 DIAGNOSIS — R29818 Other symptoms and signs involving the nervous system: Secondary | ICD-10-CM | POA: Diagnosis not present

## 2018-02-26 DIAGNOSIS — E1165 Type 2 diabetes mellitus with hyperglycemia: Secondary | ICD-10-CM | POA: Diagnosis not present

## 2018-02-26 DIAGNOSIS — R4189 Other symptoms and signs involving cognitive functions and awareness: Secondary | ICD-10-CM | POA: Diagnosis not present

## 2018-02-26 DIAGNOSIS — I2510 Atherosclerotic heart disease of native coronary artery without angina pectoris: Secondary | ICD-10-CM | POA: Diagnosis not present

## 2018-02-26 DIAGNOSIS — R338 Other retention of urine: Secondary | ICD-10-CM

## 2018-02-26 DIAGNOSIS — A4159 Other Gram-negative sepsis: Secondary | ICD-10-CM

## 2018-02-26 DIAGNOSIS — M6281 Muscle weakness (generalized): Secondary | ICD-10-CM | POA: Diagnosis not present

## 2018-02-26 DIAGNOSIS — R2689 Other abnormalities of gait and mobility: Secondary | ICD-10-CM | POA: Diagnosis not present

## 2018-02-26 DIAGNOSIS — R1312 Dysphagia, oropharyngeal phase: Secondary | ICD-10-CM | POA: Diagnosis not present

## 2018-02-26 DIAGNOSIS — E11610 Type 2 diabetes mellitus with diabetic neuropathic arthropathy: Secondary | ICD-10-CM | POA: Diagnosis not present

## 2018-02-26 DIAGNOSIS — Z794 Long term (current) use of insulin: Secondary | ICD-10-CM | POA: Diagnosis not present

## 2018-02-26 DIAGNOSIS — F0390 Unspecified dementia without behavioral disturbance: Secondary | ICD-10-CM | POA: Diagnosis not present

## 2018-02-26 NOTE — Progress Notes (Signed)
NURSING HOME LOCATION:  [facility] ROOM NUMBER:  [id number]  CODE STATUS:  DNR  PCP: Pecola Lawless, MD  [address] [place] [address]  This is a nursing facility follow up of chronic medical diagnoses.  The patient has been on the [facility] service, but has transferred to [facility]  Interim medical record and care since last [facility] visit was updated with review of diagnostic studies and change in clinical status since last visit were documented.  HPI: The patient was hospitalized 7/5-12/29/2017 admitted from home with a 4-day history of increasing somnolence and decreased oral intake.  According to the records the patient had a Foley catheter placed approximately 2 weeks prior to his admission because of urinary retention.  He had pulled out his Foley catheter on 7/1.  Urology saw the patient 7/2 and recommended keeping the Foley out as long as he was able to urinate spontaneously.  On the morning of 7/3 the patient had a temperature & was markedly somnolent. His wife notified [facility] and recommendation was that he be seen in the ED. Patient had completed a course of Augmentin 1 week prior to admission.  Loose stool reported but not frank diarrhea apparently. In ED temperature was up to 103 F with tachycardia.  Otherwise vital signs were hemodynamically stable.  DKA was diagnosed, serum glucose was 499.  A1c was 11.1%.  He received IV insulin, IV fluids, meropenem and vancomycin.  Vancomycin was discontinued when urine C&S revealed Klebsiella ESBL. His basal insulin was increased to 30 units.  He received a sliding scale coverage as well. The somnolence was attributed to acute metabolic encephalopathy secondary to DKA.  According to his spouse he was mental status was at baseline (dementia) at discharge. Dysphagia 3 diet was continued for aspiration prevention.  PMH includes coronary artery disease which remained stable during the hospitalization.  He also has history of  stroke, peripheral arterial disease, myocardial infarction, and history of alcohol abuse.  He has had multiple urologic and vascular procedures including coronary artery stenting.  Past surgical history also includes "brain surgery" without further delineation At the SNF the basal insulin has been increased to 50 units daily. At the SNF random glucoses range from a low of 58 fasting up to 158 fasting.  Review of systems: Dementia invalidated responses. Date given as "8, 2000" and president as "your buddy Bush".  He can provide no history related to his urologic diagnoses. PT/OT state that his wife is encouraging them to apply his prosthetic leg and ambulate him.  He has fallen twice here at the SNF because of instability.  They are concerned that he will try to ambulate on his own if the prosthetic leg is left in place & he is unattended.   He has been on thick liquids but his wife has been bringing him thin liquids.  To date there is been no definite aspiration.  Physical exam:  Pertinent or positive findings: He appears somewhat chronically ill.He is slightly hard of hearing.  Pattern alopecia is present.  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  He is edentulous.  His earlobes are fleshy,irregular in contour and enlarged.  He has low-grade diffuse rhonchi.  He has minimal range of motion of the left upper extremity which he describes as a "birth defect".  There is decreased tone in the right calf although strength is good in the right lower extremity as well as the right upper extremity.  Right foot is surgically  dressed.  Pulses in the right foot are decreased.  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  He has scattered bruising and vitiligo over the forearms as well as the right inferior shin.  General appearance:  no acute distress, increased work of breathing is present.   Lymphatic: No lymphadenopathy about the head, neck, axilla. Eyes: No conjunctival inflammation or lid edema is present. There is no scleral  icterus. Ears:  External ear exam shows no significant lesions or deformities.   Nose:  External nasal examination shows no deformity or inflammation. Nasal mucosa are pink and moist without lesions, exudates Oral exam:  Lips and gums are healthy appearing. There is no oropharyngeal erythema or exudate. Neck:  No thyromegaly, masses, tenderness noted.    Heart:  Normal rate and regular rhythm. S1 and S2 normal without gallop, murmur, click, rub .  Lungs:  without wheezes, rales, rubs. Abdomen: Bowel sounds are normal. Abdomen is soft and nontender with no organomegaly, hernias, masses. GU: Deferred  Extremities:  No cyanosis, clubbing, edema  Neurologic exam : Balance, Rhomberg, finger to nose testing could not be completed due to clinical state Skin: Warm & dry w/o tenting. No significant rash.  See summary under each active problem in the Problem List with associated updated therapeutic plan

## 2018-02-26 NOTE — Assessment & Plan Note (Signed)
PT/OT/ST reports spouse providing thin liquids without clinical aspiration to date Speech therapy may request formal swallowing eval

## 2018-02-26 NOTE — Assessment & Plan Note (Addendum)
Urology follow-up indicated because of recurrent UTIs & sepsis

## 2018-02-26 NOTE — Assessment & Plan Note (Addendum)
12/26/2017 A1c 11.1% indicating uncontrolled diabetes; but FBSs low A1c goal with his comorbidities and advanced age would be an A1c of 8% or less as long as there is no hypoglycemia Change basal to 35 u & add 5 u Novolog pre lunch & eve meal

## 2018-02-27 DIAGNOSIS — I2510 Atherosclerotic heart disease of native coronary artery without angina pectoris: Secondary | ICD-10-CM | POA: Diagnosis not present

## 2018-02-27 DIAGNOSIS — M6281 Muscle weakness (generalized): Secondary | ICD-10-CM | POA: Diagnosis not present

## 2018-02-27 DIAGNOSIS — F0390 Unspecified dementia without behavioral disturbance: Secondary | ICD-10-CM | POA: Diagnosis not present

## 2018-02-27 DIAGNOSIS — R2689 Other abnormalities of gait and mobility: Secondary | ICD-10-CM | POA: Diagnosis not present

## 2018-02-27 DIAGNOSIS — E11610 Type 2 diabetes mellitus with diabetic neuropathic arthropathy: Secondary | ICD-10-CM | POA: Diagnosis not present

## 2018-02-27 DIAGNOSIS — R1312 Dysphagia, oropharyngeal phase: Secondary | ICD-10-CM | POA: Diagnosis not present

## 2018-02-27 NOTE — Assessment & Plan Note (Signed)
Formal MS testing with [facility] @ SNF

## 2018-02-27 NOTE — Patient Instructions (Signed)
See assessment and plan under each diagnosis in the problem list and acutely for this visit 

## 2018-02-27 NOTE — Assessment & Plan Note (Signed)
S/P full course of antibiotics; initially empiric dual therapy

## 2018-03-01 DIAGNOSIS — M6281 Muscle weakness (generalized): Secondary | ICD-10-CM | POA: Diagnosis not present

## 2018-03-01 DIAGNOSIS — I2510 Atherosclerotic heart disease of native coronary artery without angina pectoris: Secondary | ICD-10-CM | POA: Diagnosis not present

## 2018-03-01 DIAGNOSIS — E11610 Type 2 diabetes mellitus with diabetic neuropathic arthropathy: Secondary | ICD-10-CM | POA: Diagnosis not present

## 2018-03-01 DIAGNOSIS — F0390 Unspecified dementia without behavioral disturbance: Secondary | ICD-10-CM | POA: Diagnosis not present

## 2018-03-01 DIAGNOSIS — R1312 Dysphagia, oropharyngeal phase: Secondary | ICD-10-CM | POA: Diagnosis not present

## 2018-03-01 DIAGNOSIS — R2689 Other abnormalities of gait and mobility: Secondary | ICD-10-CM | POA: Diagnosis not present

## 2018-03-02 DIAGNOSIS — R1312 Dysphagia, oropharyngeal phase: Secondary | ICD-10-CM | POA: Diagnosis not present

## 2018-03-02 DIAGNOSIS — E11610 Type 2 diabetes mellitus with diabetic neuropathic arthropathy: Secondary | ICD-10-CM | POA: Diagnosis not present

## 2018-03-02 DIAGNOSIS — I2510 Atherosclerotic heart disease of native coronary artery without angina pectoris: Secondary | ICD-10-CM | POA: Diagnosis not present

## 2018-03-02 DIAGNOSIS — R2689 Other abnormalities of gait and mobility: Secondary | ICD-10-CM | POA: Diagnosis not present

## 2018-03-02 DIAGNOSIS — M6281 Muscle weakness (generalized): Secondary | ICD-10-CM | POA: Diagnosis not present

## 2018-03-02 DIAGNOSIS — F0390 Unspecified dementia without behavioral disturbance: Secondary | ICD-10-CM | POA: Diagnosis not present

## 2018-03-03 DIAGNOSIS — I2510 Atherosclerotic heart disease of native coronary artery without angina pectoris: Secondary | ICD-10-CM | POA: Diagnosis not present

## 2018-03-03 DIAGNOSIS — R1312 Dysphagia, oropharyngeal phase: Secondary | ICD-10-CM | POA: Diagnosis not present

## 2018-03-03 DIAGNOSIS — R2689 Other abnormalities of gait and mobility: Secondary | ICD-10-CM | POA: Diagnosis not present

## 2018-03-03 DIAGNOSIS — F0390 Unspecified dementia without behavioral disturbance: Secondary | ICD-10-CM | POA: Diagnosis not present

## 2018-03-03 DIAGNOSIS — E11610 Type 2 diabetes mellitus with diabetic neuropathic arthropathy: Secondary | ICD-10-CM | POA: Diagnosis not present

## 2018-03-03 DIAGNOSIS — M6281 Muscle weakness (generalized): Secondary | ICD-10-CM | POA: Diagnosis not present

## 2018-03-04 DIAGNOSIS — I2510 Atherosclerotic heart disease of native coronary artery without angina pectoris: Secondary | ICD-10-CM | POA: Diagnosis not present

## 2018-03-04 DIAGNOSIS — R1312 Dysphagia, oropharyngeal phase: Secondary | ICD-10-CM | POA: Diagnosis not present

## 2018-03-04 DIAGNOSIS — F0390 Unspecified dementia without behavioral disturbance: Secondary | ICD-10-CM | POA: Diagnosis not present

## 2018-03-04 DIAGNOSIS — M6281 Muscle weakness (generalized): Secondary | ICD-10-CM | POA: Diagnosis not present

## 2018-03-04 DIAGNOSIS — E11610 Type 2 diabetes mellitus with diabetic neuropathic arthropathy: Secondary | ICD-10-CM | POA: Diagnosis not present

## 2018-03-04 DIAGNOSIS — R2689 Other abnormalities of gait and mobility: Secondary | ICD-10-CM | POA: Diagnosis not present

## 2018-03-05 DIAGNOSIS — F0390 Unspecified dementia without behavioral disturbance: Secondary | ICD-10-CM | POA: Diagnosis not present

## 2018-03-05 DIAGNOSIS — I2510 Atherosclerotic heart disease of native coronary artery without angina pectoris: Secondary | ICD-10-CM | POA: Diagnosis not present

## 2018-03-05 DIAGNOSIS — M6281 Muscle weakness (generalized): Secondary | ICD-10-CM | POA: Diagnosis not present

## 2018-03-05 DIAGNOSIS — E11610 Type 2 diabetes mellitus with diabetic neuropathic arthropathy: Secondary | ICD-10-CM | POA: Diagnosis not present

## 2018-03-05 DIAGNOSIS — R1312 Dysphagia, oropharyngeal phase: Secondary | ICD-10-CM | POA: Diagnosis not present

## 2018-03-05 DIAGNOSIS — R2689 Other abnormalities of gait and mobility: Secondary | ICD-10-CM | POA: Diagnosis not present

## 2018-03-06 DIAGNOSIS — R2689 Other abnormalities of gait and mobility: Secondary | ICD-10-CM | POA: Diagnosis not present

## 2018-03-06 DIAGNOSIS — M6281 Muscle weakness (generalized): Secondary | ICD-10-CM | POA: Diagnosis not present

## 2018-03-06 DIAGNOSIS — I2510 Atherosclerotic heart disease of native coronary artery without angina pectoris: Secondary | ICD-10-CM | POA: Diagnosis not present

## 2018-03-06 DIAGNOSIS — E11610 Type 2 diabetes mellitus with diabetic neuropathic arthropathy: Secondary | ICD-10-CM | POA: Diagnosis not present

## 2018-03-06 DIAGNOSIS — F0390 Unspecified dementia without behavioral disturbance: Secondary | ICD-10-CM | POA: Diagnosis not present

## 2018-03-06 DIAGNOSIS — R1312 Dysphagia, oropharyngeal phase: Secondary | ICD-10-CM | POA: Diagnosis not present

## 2018-03-07 DIAGNOSIS — E11610 Type 2 diabetes mellitus with diabetic neuropathic arthropathy: Secondary | ICD-10-CM | POA: Diagnosis not present

## 2018-03-07 DIAGNOSIS — R1312 Dysphagia, oropharyngeal phase: Secondary | ICD-10-CM | POA: Diagnosis not present

## 2018-03-07 DIAGNOSIS — I2510 Atherosclerotic heart disease of native coronary artery without angina pectoris: Secondary | ICD-10-CM | POA: Diagnosis not present

## 2018-03-07 DIAGNOSIS — F0390 Unspecified dementia without behavioral disturbance: Secondary | ICD-10-CM | POA: Diagnosis not present

## 2018-03-07 DIAGNOSIS — R2689 Other abnormalities of gait and mobility: Secondary | ICD-10-CM | POA: Diagnosis not present

## 2018-03-07 DIAGNOSIS — M6281 Muscle weakness (generalized): Secondary | ICD-10-CM | POA: Diagnosis not present

## 2018-03-09 ENCOUNTER — Other Ambulatory Visit (HOSPITAL_COMMUNITY): Payer: Self-pay | Admitting: Internal Medicine

## 2018-03-09 DIAGNOSIS — E11610 Type 2 diabetes mellitus with diabetic neuropathic arthropathy: Secondary | ICD-10-CM | POA: Diagnosis not present

## 2018-03-09 DIAGNOSIS — R2689 Other abnormalities of gait and mobility: Secondary | ICD-10-CM | POA: Diagnosis not present

## 2018-03-09 DIAGNOSIS — M6281 Muscle weakness (generalized): Secondary | ICD-10-CM | POA: Diagnosis not present

## 2018-03-09 DIAGNOSIS — R1312 Dysphagia, oropharyngeal phase: Secondary | ICD-10-CM | POA: Diagnosis not present

## 2018-03-09 DIAGNOSIS — R1310 Dysphagia, unspecified: Secondary | ICD-10-CM

## 2018-03-09 DIAGNOSIS — I2510 Atherosclerotic heart disease of native coronary artery without angina pectoris: Secondary | ICD-10-CM | POA: Diagnosis not present

## 2018-03-09 DIAGNOSIS — F0390 Unspecified dementia without behavioral disturbance: Secondary | ICD-10-CM | POA: Diagnosis not present

## 2018-03-10 DIAGNOSIS — R1312 Dysphagia, oropharyngeal phase: Secondary | ICD-10-CM | POA: Diagnosis not present

## 2018-03-10 DIAGNOSIS — I2510 Atherosclerotic heart disease of native coronary artery without angina pectoris: Secondary | ICD-10-CM | POA: Diagnosis not present

## 2018-03-10 DIAGNOSIS — F0390 Unspecified dementia without behavioral disturbance: Secondary | ICD-10-CM | POA: Diagnosis not present

## 2018-03-10 DIAGNOSIS — M6281 Muscle weakness (generalized): Secondary | ICD-10-CM | POA: Diagnosis not present

## 2018-03-10 DIAGNOSIS — E11610 Type 2 diabetes mellitus with diabetic neuropathic arthropathy: Secondary | ICD-10-CM | POA: Diagnosis not present

## 2018-03-10 DIAGNOSIS — R2689 Other abnormalities of gait and mobility: Secondary | ICD-10-CM | POA: Diagnosis not present

## 2018-03-11 DIAGNOSIS — M6281 Muscle weakness (generalized): Secondary | ICD-10-CM | POA: Diagnosis not present

## 2018-03-11 DIAGNOSIS — F0390 Unspecified dementia without behavioral disturbance: Secondary | ICD-10-CM | POA: Diagnosis not present

## 2018-03-11 DIAGNOSIS — R2689 Other abnormalities of gait and mobility: Secondary | ICD-10-CM | POA: Diagnosis not present

## 2018-03-11 DIAGNOSIS — R1312 Dysphagia, oropharyngeal phase: Secondary | ICD-10-CM | POA: Diagnosis not present

## 2018-03-11 DIAGNOSIS — I2510 Atherosclerotic heart disease of native coronary artery without angina pectoris: Secondary | ICD-10-CM | POA: Diagnosis not present

## 2018-03-11 DIAGNOSIS — E11610 Type 2 diabetes mellitus with diabetic neuropathic arthropathy: Secondary | ICD-10-CM | POA: Diagnosis not present

## 2018-03-12 DIAGNOSIS — R1312 Dysphagia, oropharyngeal phase: Secondary | ICD-10-CM | POA: Diagnosis not present

## 2018-03-12 DIAGNOSIS — R2689 Other abnormalities of gait and mobility: Secondary | ICD-10-CM | POA: Diagnosis not present

## 2018-03-12 DIAGNOSIS — M6281 Muscle weakness (generalized): Secondary | ICD-10-CM | POA: Diagnosis not present

## 2018-03-12 DIAGNOSIS — I2510 Atherosclerotic heart disease of native coronary artery without angina pectoris: Secondary | ICD-10-CM | POA: Diagnosis not present

## 2018-03-12 DIAGNOSIS — F0390 Unspecified dementia without behavioral disturbance: Secondary | ICD-10-CM | POA: Diagnosis not present

## 2018-03-12 DIAGNOSIS — E11610 Type 2 diabetes mellitus with diabetic neuropathic arthropathy: Secondary | ICD-10-CM | POA: Diagnosis not present

## 2018-03-13 DIAGNOSIS — F0390 Unspecified dementia without behavioral disturbance: Secondary | ICD-10-CM | POA: Diagnosis not present

## 2018-03-13 DIAGNOSIS — R1312 Dysphagia, oropharyngeal phase: Secondary | ICD-10-CM | POA: Diagnosis not present

## 2018-03-13 DIAGNOSIS — E11610 Type 2 diabetes mellitus with diabetic neuropathic arthropathy: Secondary | ICD-10-CM | POA: Diagnosis not present

## 2018-03-13 DIAGNOSIS — M6281 Muscle weakness (generalized): Secondary | ICD-10-CM | POA: Diagnosis not present

## 2018-03-13 DIAGNOSIS — R2689 Other abnormalities of gait and mobility: Secondary | ICD-10-CM | POA: Diagnosis not present

## 2018-03-13 DIAGNOSIS — I2510 Atherosclerotic heart disease of native coronary artery without angina pectoris: Secondary | ICD-10-CM | POA: Diagnosis not present

## 2018-03-16 DIAGNOSIS — M6281 Muscle weakness (generalized): Secondary | ICD-10-CM | POA: Diagnosis not present

## 2018-03-16 DIAGNOSIS — E11610 Type 2 diabetes mellitus with diabetic neuropathic arthropathy: Secondary | ICD-10-CM | POA: Diagnosis not present

## 2018-03-16 DIAGNOSIS — R2689 Other abnormalities of gait and mobility: Secondary | ICD-10-CM | POA: Diagnosis not present

## 2018-03-16 DIAGNOSIS — R1312 Dysphagia, oropharyngeal phase: Secondary | ICD-10-CM | POA: Diagnosis not present

## 2018-03-16 DIAGNOSIS — F0390 Unspecified dementia without behavioral disturbance: Secondary | ICD-10-CM | POA: Diagnosis not present

## 2018-03-16 DIAGNOSIS — I2510 Atherosclerotic heart disease of native coronary artery without angina pectoris: Secondary | ICD-10-CM | POA: Diagnosis not present

## 2018-03-17 ENCOUNTER — Ambulatory Visit (HOSPITAL_COMMUNITY)
Admission: RE | Admit: 2018-03-17 | Discharge: 2018-03-17 | Disposition: A | Discharge status: Home / Self Care | Payer: Medicare (Managed Care) | Source: Ambulatory Visit | Attending: Internal Medicine | Admitting: Internal Medicine

## 2018-03-17 DIAGNOSIS — I252 Old myocardial infarction: Secondary | ICD-10-CM | POA: Insufficient documentation

## 2018-03-17 DIAGNOSIS — R1313 Dysphagia, pharyngeal phase: Secondary | ICD-10-CM | POA: Insufficient documentation

## 2018-03-17 DIAGNOSIS — R1310 Dysphagia, unspecified: Secondary | ICD-10-CM

## 2018-03-17 DIAGNOSIS — E11610 Type 2 diabetes mellitus with diabetic neuropathic arthropathy: Secondary | ICD-10-CM | POA: Diagnosis not present

## 2018-03-17 DIAGNOSIS — R2689 Other abnormalities of gait and mobility: Secondary | ICD-10-CM | POA: Diagnosis not present

## 2018-03-17 DIAGNOSIS — R1312 Dysphagia, oropharyngeal phase: Secondary | ICD-10-CM | POA: Diagnosis not present

## 2018-03-17 DIAGNOSIS — F0390 Unspecified dementia without behavioral disturbance: Secondary | ICD-10-CM | POA: Insufficient documentation

## 2018-03-17 DIAGNOSIS — M6281 Muscle weakness (generalized): Secondary | ICD-10-CM | POA: Diagnosis not present

## 2018-03-17 DIAGNOSIS — F1010 Alcohol abuse, uncomplicated: Secondary | ICD-10-CM | POA: Insufficient documentation

## 2018-03-17 DIAGNOSIS — M1990 Unspecified osteoarthritis, unspecified site: Secondary | ICD-10-CM | POA: Insufficient documentation

## 2018-03-17 DIAGNOSIS — E1151 Type 2 diabetes mellitus with diabetic peripheral angiopathy without gangrene: Secondary | ICD-10-CM | POA: Insufficient documentation

## 2018-03-17 DIAGNOSIS — Z955 Presence of coronary angioplasty implant and graft: Secondary | ICD-10-CM | POA: Diagnosis not present

## 2018-03-17 DIAGNOSIS — I2510 Atherosclerotic heart disease of native coronary artery without angina pectoris: Secondary | ICD-10-CM | POA: Diagnosis not present

## 2018-03-17 DIAGNOSIS — Z8673 Personal history of transient ischemic attack (TIA), and cerebral infarction without residual deficits: Secondary | ICD-10-CM | POA: Insufficient documentation

## 2018-03-17 NOTE — Progress Notes (Signed)
Modified Barium Swallow Progress Note  Patient Details  Name: Alan SeedsGlenn A Gutt Jr. MRN: [id number] Date of Birth: 09/03/1945  Today's Date: 03/17/2018  Modified Barium Swallow completed.  Full report located under Chart Review in the Imaging Section.  Brief recommendations include the following:  Clinical Impression  Pt presented with severe pharyngeal dysphagia marked by reduced laryngeal/pharyngeal sensation, timing of swallow initiation and significant penetration and aspiration of thin and nectar thick liquids. Swallow initiated at the valleculae and pyriform sinuses and barium was penetrated into laryngeal vestibule prior to full epiglottic deflection. No aspiration present with honey consistency however one instance of high penetration observed. When attempting chin tuck strategy, nectar penetrated before swallow initiation and was aspirated. Pt demonstrated reduced sensation of aspirates (reflexive cough only 1/6 trials) and cued volitional coughs were ineffective to clear.  Recommend continue to receive ST services at SNF, continue D2 (minced/ground) diet with honey thick liquids using small/slow bites and sips with chin tuck, volitional cough to facilitate safe swallow.    Swallow Evaluation Recommendations       SLP Diet Recommendations: Dysphagia 2 (Fine chop) solids;Honey thick liquids   Liquid Administration via: Cup   Medication Administration: Whole meds with puree   Supervision: Patient able to self feed;Full supervision/cueing for compensatory strategies   Compensations: Minimize environmental distractions;Slow rate;Small sips/bites;Multiple dry swallows after each bite/sip;Hard cough after swallow;Chin tuck   Postural Changes: Seated upright at 90 degrees   Oral Care Recommendations: Oral care BID        Alan [name], Alan [name] 03/17/2018,3:51 PM   Breck CoonsLisa [name] GilbertvilleLitaker M.Ed Nurse, children'sCCC-SLP Speech-Language Pathologist Pager [phone] Office [phone]

## 2018-03-18 DIAGNOSIS — F0390 Unspecified dementia without behavioral disturbance: Secondary | ICD-10-CM | POA: Diagnosis not present

## 2018-03-18 DIAGNOSIS — R1312 Dysphagia, oropharyngeal phase: Secondary | ICD-10-CM | POA: Diagnosis not present

## 2018-03-18 DIAGNOSIS — M6281 Muscle weakness (generalized): Secondary | ICD-10-CM | POA: Diagnosis not present

## 2018-03-18 DIAGNOSIS — R2689 Other abnormalities of gait and mobility: Secondary | ICD-10-CM | POA: Diagnosis not present

## 2018-03-18 DIAGNOSIS — E11610 Type 2 diabetes mellitus with diabetic neuropathic arthropathy: Secondary | ICD-10-CM | POA: Diagnosis not present

## 2018-03-18 DIAGNOSIS — I2510 Atherosclerotic heart disease of native coronary artery without angina pectoris: Secondary | ICD-10-CM | POA: Diagnosis not present

## 2018-03-19 DIAGNOSIS — I2510 Atherosclerotic heart disease of native coronary artery without angina pectoris: Secondary | ICD-10-CM | POA: Diagnosis not present

## 2018-03-19 DIAGNOSIS — F0390 Unspecified dementia without behavioral disturbance: Secondary | ICD-10-CM | POA: Diagnosis not present

## 2018-03-19 DIAGNOSIS — M6281 Muscle weakness (generalized): Secondary | ICD-10-CM | POA: Diagnosis not present

## 2018-03-19 DIAGNOSIS — E11610 Type 2 diabetes mellitus with diabetic neuropathic arthropathy: Secondary | ICD-10-CM | POA: Diagnosis not present

## 2018-03-19 DIAGNOSIS — R1312 Dysphagia, oropharyngeal phase: Secondary | ICD-10-CM | POA: Diagnosis not present

## 2018-03-19 DIAGNOSIS — R2689 Other abnormalities of gait and mobility: Secondary | ICD-10-CM | POA: Diagnosis not present

## 2018-03-21 DIAGNOSIS — R2689 Other abnormalities of gait and mobility: Secondary | ICD-10-CM | POA: Diagnosis not present

## 2018-03-21 DIAGNOSIS — R1312 Dysphagia, oropharyngeal phase: Secondary | ICD-10-CM | POA: Diagnosis not present

## 2018-03-21 DIAGNOSIS — M6281 Muscle weakness (generalized): Secondary | ICD-10-CM | POA: Diagnosis not present

## 2018-03-21 DIAGNOSIS — F0390 Unspecified dementia without behavioral disturbance: Secondary | ICD-10-CM | POA: Diagnosis not present

## 2018-03-21 DIAGNOSIS — I2510 Atherosclerotic heart disease of native coronary artery without angina pectoris: Secondary | ICD-10-CM | POA: Diagnosis not present

## 2018-03-21 DIAGNOSIS — E11610 Type 2 diabetes mellitus with diabetic neuropathic arthropathy: Secondary | ICD-10-CM | POA: Diagnosis not present

## 2018-03-22 DIAGNOSIS — R2689 Other abnormalities of gait and mobility: Secondary | ICD-10-CM | POA: Diagnosis not present

## 2018-03-22 DIAGNOSIS — I2510 Atherosclerotic heart disease of native coronary artery without angina pectoris: Secondary | ICD-10-CM | POA: Diagnosis not present

## 2018-03-22 DIAGNOSIS — F0390 Unspecified dementia without behavioral disturbance: Secondary | ICD-10-CM | POA: Diagnosis not present

## 2018-03-22 DIAGNOSIS — R1312 Dysphagia, oropharyngeal phase: Secondary | ICD-10-CM | POA: Diagnosis not present

## 2018-03-22 DIAGNOSIS — E11610 Type 2 diabetes mellitus with diabetic neuropathic arthropathy: Secondary | ICD-10-CM | POA: Diagnosis not present

## 2018-03-22 DIAGNOSIS — M6281 Muscle weakness (generalized): Secondary | ICD-10-CM | POA: Diagnosis not present

## 2018-03-24 DIAGNOSIS — M6281 Muscle weakness (generalized): Secondary | ICD-10-CM | POA: Diagnosis not present

## 2018-03-24 DIAGNOSIS — I2510 Atherosclerotic heart disease of native coronary artery without angina pectoris: Secondary | ICD-10-CM | POA: Diagnosis not present

## 2018-03-24 DIAGNOSIS — E11610 Type 2 diabetes mellitus with diabetic neuropathic arthropathy: Secondary | ICD-10-CM | POA: Diagnosis not present

## 2018-03-24 DIAGNOSIS — R2689 Other abnormalities of gait and mobility: Secondary | ICD-10-CM | POA: Diagnosis not present

## 2018-03-25 DIAGNOSIS — M6281 Muscle weakness (generalized): Secondary | ICD-10-CM | POA: Diagnosis not present

## 2018-03-25 DIAGNOSIS — I2510 Atherosclerotic heart disease of native coronary artery without angina pectoris: Secondary | ICD-10-CM | POA: Diagnosis not present

## 2018-03-25 DIAGNOSIS — E11610 Type 2 diabetes mellitus with diabetic neuropathic arthropathy: Secondary | ICD-10-CM | POA: Diagnosis not present

## 2018-03-25 DIAGNOSIS — R2689 Other abnormalities of gait and mobility: Secondary | ICD-10-CM | POA: Diagnosis not present

## 2018-03-26 DIAGNOSIS — I2510 Atherosclerotic heart disease of native coronary artery without angina pectoris: Secondary | ICD-10-CM | POA: Diagnosis not present

## 2018-03-26 DIAGNOSIS — E11610 Type 2 diabetes mellitus with diabetic neuropathic arthropathy: Secondary | ICD-10-CM | POA: Diagnosis not present

## 2018-03-26 DIAGNOSIS — R2689 Other abnormalities of gait and mobility: Secondary | ICD-10-CM | POA: Diagnosis not present

## 2018-03-26 DIAGNOSIS — M6281 Muscle weakness (generalized): Secondary | ICD-10-CM | POA: Diagnosis not present

## 2018-03-27 DIAGNOSIS — R2689 Other abnormalities of gait and mobility: Secondary | ICD-10-CM | POA: Diagnosis not present

## 2018-03-27 DIAGNOSIS — M6281 Muscle weakness (generalized): Secondary | ICD-10-CM | POA: Diagnosis not present

## 2018-03-27 DIAGNOSIS — E11610 Type 2 diabetes mellitus with diabetic neuropathic arthropathy: Secondary | ICD-10-CM | POA: Diagnosis not present

## 2018-03-27 DIAGNOSIS — I2510 Atherosclerotic heart disease of native coronary artery without angina pectoris: Secondary | ICD-10-CM | POA: Diagnosis not present

## 2018-03-30 DIAGNOSIS — I2510 Atherosclerotic heart disease of native coronary artery without angina pectoris: Secondary | ICD-10-CM | POA: Diagnosis not present

## 2018-03-30 DIAGNOSIS — E11610 Type 2 diabetes mellitus with diabetic neuropathic arthropathy: Secondary | ICD-10-CM | POA: Diagnosis not present

## 2018-03-30 DIAGNOSIS — R2689 Other abnormalities of gait and mobility: Secondary | ICD-10-CM | POA: Diagnosis not present

## 2018-03-30 DIAGNOSIS — M6281 Muscle weakness (generalized): Secondary | ICD-10-CM | POA: Diagnosis not present

## 2018-03-31 DIAGNOSIS — E11610 Type 2 diabetes mellitus with diabetic neuropathic arthropathy: Secondary | ICD-10-CM | POA: Diagnosis not present

## 2018-03-31 DIAGNOSIS — M6281 Muscle weakness (generalized): Secondary | ICD-10-CM | POA: Diagnosis not present

## 2018-03-31 DIAGNOSIS — R2689 Other abnormalities of gait and mobility: Secondary | ICD-10-CM | POA: Diagnosis not present

## 2018-03-31 DIAGNOSIS — I2510 Atherosclerotic heart disease of native coronary artery without angina pectoris: Secondary | ICD-10-CM | POA: Diagnosis not present

## 2018-04-01 DIAGNOSIS — R2689 Other abnormalities of gait and mobility: Secondary | ICD-10-CM | POA: Diagnosis not present

## 2018-04-01 DIAGNOSIS — E11610 Type 2 diabetes mellitus with diabetic neuropathic arthropathy: Secondary | ICD-10-CM | POA: Diagnosis not present

## 2018-04-01 DIAGNOSIS — I2510 Atherosclerotic heart disease of native coronary artery without angina pectoris: Secondary | ICD-10-CM | POA: Diagnosis not present

## 2018-04-01 DIAGNOSIS — M6281 Muscle weakness (generalized): Secondary | ICD-10-CM | POA: Diagnosis not present

## 2018-04-02 DIAGNOSIS — R2689 Other abnormalities of gait and mobility: Secondary | ICD-10-CM | POA: Diagnosis not present

## 2018-04-02 DIAGNOSIS — M6281 Muscle weakness (generalized): Secondary | ICD-10-CM | POA: Diagnosis not present

## 2018-04-02 DIAGNOSIS — I2510 Atherosclerotic heart disease of native coronary artery without angina pectoris: Secondary | ICD-10-CM | POA: Diagnosis not present

## 2018-04-02 DIAGNOSIS — E11610 Type 2 diabetes mellitus with diabetic neuropathic arthropathy: Secondary | ICD-10-CM | POA: Diagnosis not present

## 2018-04-03 DIAGNOSIS — R2689 Other abnormalities of gait and mobility: Secondary | ICD-10-CM | POA: Diagnosis not present

## 2018-04-03 DIAGNOSIS — E11610 Type 2 diabetes mellitus with diabetic neuropathic arthropathy: Secondary | ICD-10-CM | POA: Diagnosis not present

## 2018-04-03 DIAGNOSIS — M6281 Muscle weakness (generalized): Secondary | ICD-10-CM | POA: Diagnosis not present

## 2018-04-03 DIAGNOSIS — I2510 Atherosclerotic heart disease of native coronary artery without angina pectoris: Secondary | ICD-10-CM | POA: Diagnosis not present

## 2018-04-06 DIAGNOSIS — E11610 Type 2 diabetes mellitus with diabetic neuropathic arthropathy: Secondary | ICD-10-CM | POA: Diagnosis not present

## 2018-04-06 DIAGNOSIS — I2510 Atherosclerotic heart disease of native coronary artery without angina pectoris: Secondary | ICD-10-CM | POA: Diagnosis not present

## 2018-04-06 DIAGNOSIS — M6281 Muscle weakness (generalized): Secondary | ICD-10-CM | POA: Diagnosis not present

## 2018-04-06 DIAGNOSIS — R2689 Other abnormalities of gait and mobility: Secondary | ICD-10-CM | POA: Diagnosis not present

## 2018-04-07 ENCOUNTER — Other Ambulatory Visit: Payer: Self-pay

## 2018-04-07 DIAGNOSIS — R2689 Other abnormalities of gait and mobility: Secondary | ICD-10-CM | POA: Diagnosis not present

## 2018-04-07 DIAGNOSIS — E11610 Type 2 diabetes mellitus with diabetic neuropathic arthropathy: Secondary | ICD-10-CM | POA: Diagnosis not present

## 2018-04-07 DIAGNOSIS — I2510 Atherosclerotic heart disease of native coronary artery without angina pectoris: Secondary | ICD-10-CM | POA: Diagnosis not present

## 2018-04-07 DIAGNOSIS — M6281 Muscle weakness (generalized): Secondary | ICD-10-CM | POA: Diagnosis not present

## 2018-04-07 MED ORDER — LORAZEPAM 1 MG PO TABS: 0.5000 mg | ORAL_TABLET | Freq: Two times a day (BID) | ORAL | 0 refills | Status: DC

## 2018-04-07 NOTE — Telephone Encounter (Signed)
Refill form faxed to [facility] Fax #[phone]   

## 2018-04-09 DIAGNOSIS — Z79899 Other long term (current) drug therapy: Secondary | ICD-10-CM | POA: Diagnosis not present

## 2018-04-09 DIAGNOSIS — A0472 Enterocolitis due to Clostridium difficile, not specified as recurrent: Secondary | ICD-10-CM | POA: Diagnosis not present

## 2018-04-15 ENCOUNTER — Non-Acute Institutional Stay (SKILLED_NURSING_FACILITY): Payer: Medicare Other | Admitting: Adult Health

## 2018-04-15 ENCOUNTER — Encounter: Payer: Self-pay | Admitting: Adult Health

## 2018-04-15 DIAGNOSIS — E785 Hyperlipidemia, unspecified: Secondary | ICD-10-CM

## 2018-04-15 DIAGNOSIS — F339 Major depressive disorder, recurrent, unspecified: Secondary | ICD-10-CM

## 2018-04-15 DIAGNOSIS — G4700 Insomnia, unspecified: Secondary | ICD-10-CM | POA: Diagnosis not present

## 2018-04-15 DIAGNOSIS — E1165 Type 2 diabetes mellitus with hyperglycemia: Secondary | ICD-10-CM | POA: Diagnosis not present

## 2018-04-15 DIAGNOSIS — F419 Anxiety disorder, unspecified: Secondary | ICD-10-CM | POA: Diagnosis not present

## 2018-04-15 DIAGNOSIS — A0472 Enterocolitis due to Clostridium difficile, not specified as recurrent: Secondary | ICD-10-CM | POA: Diagnosis not present

## 2018-04-15 DIAGNOSIS — Z794 Long term (current) use of insulin: Secondary | ICD-10-CM | POA: Diagnosis not present

## 2018-04-15 NOTE — Progress Notes (Signed)
Location:  [facility] Room Number: 211 Place of Service:  SNF (31) Provider:  Kenard Gower, NP  Patient Care Team: Pecola Lawless, MD as PCP - General (Internal Medicine) Medina-Vargas, Margit Banda, NP as Nurse Practitioner (Internal Medicine)  Extended Emergency Contact Information Primary Emergency Contact: Arita [place] Home Phone: [phone] Mobile Phone: [phone] Relation: Spouse Secondary Emergency Contact: Lanae Crumbly Mobile Phone: [phone] Relation: Daughter  Code Status:  DNR  Goals of care: Advanced Directive information Advanced Directives 02/26/2018  Does Patient Have a Medical Advance Directive? Yes  Type of Advance Directive Out of facility DNR (pink MOST or yellow form)  Does patient want to make changes to medical advance directive? No - Patient declined  Copy of Healthcare Power of Attorney in Chart? -  Would patient like information on creating a medical advance directive? -  Pre-existing out of facility DNR order (yellow form or pink MOST form) -     Chief Complaint  Patient presents with  . Medical Management of Chronic Issues    Routine [facility] visit    HPI:  Pt is a 72 y.o. male seen today for medical management of chronic diseases.  He is a long-term care resident of [facility].  He has a PMH of CAD, stroke, peripheral arterial disease, MI, and alcohol abuse. He was seen in his room today. He denies pain. He is currently on Metronidazole for Clostridium difficile + stool.  He has been admitted to [facility] on 02/04/18 from a recent hospitalization due to metabolic encephalopathy secondary to sepsis from UTI , dehydration, and DKA. He was given IV fluids, antibiotics and Insulin drips.    Past Medical History:  Diagnosis Date  . Anemia   . Arthritis   . Dementia (HCC)   . Diabetes mellitus without complication (HCC)   .  ETOH abuse   . Myocardial infarction (HCC)   . Peripheral arterial disease (HCC)   . Stroke (HCC)   . Wears dentures   . Wears glasses    Past Surgical History:  Procedure Laterality Date  . ABDOMINAL AORTOGRAM N/A 10/25/2016   Procedure: Abdominal Aortogram;  Surgeon: Sherren Kerns, MD;  Location: [facility] INVASIVE CV LAB;  Service: Cardiovascular;  Laterality: N/A;  . BRAIN SURGERY    . CORONARY STENT PLACEMENT     " about 25 years ago (10/29/16)  . ENDARTERECTOMY FEMORAL Right 10/30/2016   Procedure: Eduardo Osier WITH PATCH GRAFT;  Surgeon: Sherren Kerns, MD;  Location: [facility] OR;  Service: Vascular;  Laterality: Right;  . IR NEPHRO TUBE REMOV/FL  07/04/2017  . IR NEPHRO TUBE REMOV/FL  07/07/2017  . IR NEPHROSTOMY EXCHANGE LEFT  06/20/2017  . IR NEPHROSTOMY EXCHANGE RIGHT  06/20/2017  . IR NEPHROSTOMY PLACEMENT LEFT  06/12/2017  . IR NEPHROSTOMY PLACEMENT RIGHT  06/12/2017  . IR URETERAL STENT PLACEMENT EXISTING ACCESS LEFT  06/20/2017  . IR URETERAL STENT PLACEMENT EXISTING ACCESS RIGHT  06/20/2017  . LOWER EXTREMITY ANGIOGRAPHY Right 10/25/2016   Procedure: Lower Extremity Angiography;  Surgeon: Sherren Kerns, MD;  Location: [facility] INVASIVE CV LAB;  Service: Cardiovascular;  Laterality: Right;    Allergies  Allergen Reactions  . Penicillins Rash    Has patient had a PCN reaction causing immediate rash, facial/tongue/throat swelling, SOB or lightheadedness with hypotension: Yes Has patient had a PCN reaction causing severe rash involving mucus membranes or skin necrosis: No Has patient had a PCN reaction that  required hospitalization No Has patient had a PCN reaction occurring within the last 10 years: No If all of the above answers are "NO", then may proceed with Cephalosporin use.     Outpatient Encounter Medications as of 04/15/2018  Medication Sig  . acetaminophen (TYLENOL) 650 MG CR tablet Take 650 mg by mouth 3 (three) times daily.   Marland Kitchen alendronate (FOSAMAX) 70  MG tablet Take 70 mg by mouth once a week. Take with a full glass of water on an empty stomach.  . ASPERCREME LIDOCAINE EX Apply 1 application topically 3 (three) times daily as needed. Apply to elbow and wrist for pain  . aspirin EC 81 MG tablet Take 81 mg by mouth every morning.   Marland Kitchen atorvastatin (LIPITOR) 10 MG tablet Take 10 mg by mouth at bedtime.   . Cholecalciferol (QC VITAMIN D3) 2000 units TABS Take 1 tablet by mouth daily.  . insulin aspart (NOVOLOG) 100 UNIT/ML injection Inject 5 Units into the skin 2 (two) times daily. Before lunch and dinner.  . insulin glargine (LANTUS) 100 UNIT/ML injection Inject 35 Units into the skin at bedtime.   Marland Kitchen LORazepam (ATIVAN) 1 MG tablet Take 0.5 tablets (0.5 mg total) by mouth 2 (two) times daily.  . Melatonin 10 MG TABS Take 10 mg by mouth at bedtime.  . metFORMIN (GLUCOPHAGE) 500 MG tablet Take 1,000 mg by mouth 2 (two) times daily with a meal.  . Nutritional Supplements (NUTRITIONAL SUPPLEMENT PO) Take 1 each by mouth 2 (two) times daily. Magic Cup  . sertraline (ZOLOFT) 50 MG tablet Take 50 mg by mouth 2 (two) times daily.   . Skin Protectants, Misc. (EUCERIN) cream Apply topically as needed for dry skin.  . tamsulosin (FLOMAX) 0.4 MG CAPS capsule Take 0.8 mg by mouth every evening. Take 2 capsules to = 0.8 mg  . traZODone (DESYREL) 50 MG tablet TAKE ONE TABLET (50 MG TOTAL) BY MOUTH AT BEDTIME.  . [DISCONTINUED] Bacillus Coagulans-Inulin (PROBIOTIC FORMULA PO) Take 1 capsule by mouth daily.   No facility-administered encounter medications on file as of 04/15/2018.     Review of Systems  GENERAL: No change in appetite, no fatigue, no weight changes, no fever, chills or weakness MOUTH and THROAT: Denies oral discomfort, gingival pain or bleeding, pain from teeth or hoarseness   RESPIRATORY: no cough, SOB, DOE, wheezing, hemoptysis CARDIAC: No chest pain, edema or palpitations GI: No abdominal pain, diarrhea, constipation, heart burn, nausea or  vomiting GU: Denies dysuria, frequency, hematuria, or discharge PSYCHIATRIC: Denies feelings of depression or anxiety. No report of hallucinations, insomnia, paranoia, or agitation    Immunization History  Administered Date(s) Administered  . Influenza, High Dose Seasonal PF 03/21/2015, 08/19/2016  . Pneumococcal Conjugate-13 10/03/2014  . Rotavirus Pentavalent 03/22/1998   Pertinent  Health Maintenance Due  Topic Date Due  . FOOT EXAM  01/20/1958  . OPHTHALMOLOGY EXAM  01/20/1958  . URINE MICROALBUMIN  01/20/1958  . COLONOSCOPY  01/20/1998  . PNA vac Low Risk Adult (2 of 2 - PPSV23) 10/03/2015  . INFLUENZA VACCINE  01/22/2018  . HEMOGLOBIN A1C  06/28/2018      Vitals:   04/15/18 1008  BP: 126/72  Pulse: 74  Resp: 18  Temp: (!) 97.4 F (36.3 C)  TempSrc: Oral  Weight: 191 lb 3.2 oz (86.7 kg)  Height: 5\' 10"  (1.778 m)   Body mass index is 27.43 kg/m.  Physical Exam  GENERAL APPEARANCE: Well nourished. In no acute distress. Normal body habitus SKIN:  Skin is warm and dry.  MOUTH and THROAT: Lips are without lesions. Oral mucosa is moist and without lesions. 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 Affect and behavior are appropriate   Labs reviewed: Recent Labs    12/27/17 0445 12/27/17 0908 12/28/17 0450 12/29/17 0506 12/29/17 0619  NA 149* 146* 139 141  --   K 3.0* 3.1* 4.0 3.4*  --   CL 115* 112* 104 106  --   CO2 27 27 28 27   --   GLUCOSE 158* 157* 261* 151*  --   BUN 25* 22 15 10   --   CREATININE 0.55* 0.55* 0.56* 0.44*  --   CALCIUM 7.8* 7.6* 7.8* 7.8*  --   MG 1.8  --  1.7 2.1  --   PHOS  1.4*  --  1.6*  --  3.0   Recent Labs    06/11/17 1042 07/02/17 1732 12/26/17 1040  AST 27 24 14*  ALT 20 17 18   ALKPHOS 69 89 65  BILITOT 1.0 0.4 1.4*  PROT 8.7* 7.3 8.4*  ALBUMIN 3.6 3.0* 3.9   Recent Labs    06/13/17 0038  07/02/17 1732  12/26/17 1040 12/27/17 0445 12/28/17 0450 12/29/17 0506  WBC 12.8*   < > 6.3   < > 10.8* 11.8* 10.5 7.0  NEUTROABS 9.5*  --  3.8  --  8.6*  --   --   --   HGB 12.4*   < > 10.1*   < > 12.2* 10.1* 10.6* 10.3*  HCT 39.5   < > 30.9*   < > 39.0 32.5* 33.9* 32.6*  MCV 94.0   < > 89.8   < > 92.4 92.1 90.9 88.8  PLT 211   < > 262   < > 361 331 309 277   < > = values in this interval not displayed.    Lab Results  Component Value Date   HGBA1C 11.1 (H) 12/26/2017   Significant Diagnostic Results in last 30 days:  Dg Swallowing Func-speech Pathology  Result Date: 03/17/2018 Objective Swallowing Evaluation: Type of Study: MBS-Modified Barium Swallow Study  Patient Details Name: Zakhari Fogel. MRN: [id number] Date of Birth: April 22, 1946 Today's Date: 03/17/2018 Time: SLP Start Time (ACUTE ONLY): 1149 -SLP Stop Time (ACUTE ONLY): 1215 SLP Time Calculation (min) (ACUTE ONLY): 26 min Past Medical History: Past Medical History: Diagnosis Date . Anemia  . Arthritis  . Dementia  . Diabetes mellitus without complication (HCC)  . ETOH abuse  . Myocardial infarction (HCC)  . Peripheral arterial disease (HCC)  . Stroke (HCC)  . Wears dentures  . Wears glasses  Past Surgical History: Past Surgical History: Procedure Laterality Date . ABDOMINAL AORTOGRAM N/A 10/25/2016  Procedure: Abdominal Aortogram;  Surgeon: Sherren Kerns, MD;  Location: [facility] INVASIVE CV LAB;  Service: Cardiovascular;  Laterality: N/A; . BRAIN SURGERY   . CORONARY STENT PLACEMENT    " about 25 years ago (10/29/16) . ENDARTERECTOMY FEMORAL Right 10/30/2016  Procedure: ENDARTERECTOMY FEMORAL-RIGHT WITH PATCH GRAFT;  Surgeon: Sherren Kerns, MD;  Location: [facility] OR;  Service: Vascular;  Laterality:  Right; . IR NEPHRO TUBE REMOV/FL  07/04/2017 . IR NEPHRO TUBE REMOV/FL  07/07/2017 . IR NEPHROSTOMY EXCHANGE LEFT  06/20/2017 . IR NEPHROSTOMY EXCHANGE RIGHT  06/20/2017 . IR NEPHROSTOMY PLACEMENT LEFT  06/12/2017 . IR NEPHROSTOMY PLACEMENT RIGHT  06/12/2017 . IR URETERAL STENT PLACEMENT EXISTING ACCESS LEFT  06/20/2017 . IR URETERAL STENT PLACEMENT EXISTING ACCESS RIGHT  06/20/2017 . LOWER EXTREMITY ANGIOGRAPHY Right 10/25/2016  Procedure: Lower Extremity Angiography;  Surgeon: Sherren Kerns, MD;  Location: [facility] INVASIVE CV LAB;  Service: Cardiovascular;  Laterality: Right; HPI: Pt is a 72 y.o. male with medical history of coronary disease, diabetes mellitus, dementia, stroke, and severe oropharyngeal dysphagia. He presents from SNF to [facility] for outpatient MBS to assess swallow function and potential for diet advancement.  No data recorded Assessment / Plan / Recommendation CHL IP CLINICAL IMPRESSIONS 03/17/2018 Clinical Impression Pt presented with severe pharyngeal dysphagia marked by reduced laryngeal/pharyngeal sensation, timing of swallow initiation and significant penetration and aspiration of thin and nectar thick liquids. Swallow initiated at the valleculae and pyriform sinuses and barium was penetrated into laryngeal vestibule prior to full epiglottic deflection. No aspiration present with honey consistency however one instance of high penetration observed. When attempting chin tuck strategy, nectar penetrated before swallow initiation and was aspirated. Pt demonstrated reduced sensation of aspirates (reflexive cough only 1/6 trials) and cued volitional coughs were ineffective to clear.  Recommend continue to receive ST services at SNF, continue D2 (minced/ground) diet with honey thick liquids using small/slow bites and sips with chin tuck, volitional cough to facilitate safe swallow.  SLP Visit Diagnosis Dysphagia, pharyngeal phase (R13.13) Attention and concentration deficit following -- Frontal lobe and  executive function deficit following -- Impact on safety and function Severe aspiration risk   CHL IP TREATMENT RECOMMENDATION 03/17/2018 Treatment Recommendations Defer treatment plan to f/u with SLP   Prognosis 03/17/2018 Prognosis for Safe Diet Advancement Fair Barriers to Reach Goals Severity of deficits Barriers/Prognosis Comment -- CHL IP DIET RECOMMENDATION 03/17/2018 SLP Diet Recommendations Dysphagia 2 (Fine chop) solids;Honey thick liquids Liquid Administration via Cup Medication Administration Whole meds with puree Compensations Minimize environmental distractions;Slow rate;Small sips/bites;Multiple dry swallows after each bite/sip;Hard cough after swallow;Chin tuck Postural Changes Seated upright at 90 degrees   CHL IP OTHER RECOMMENDATIONS 03/17/2018 Recommended Consults -- Oral Care Recommendations Oral care BID Other Recommendations --   CHL IP FOLLOW UP RECOMMENDATIONS 03/17/2018 Follow up Recommendations Skilled Nursing facility   [facility] IP FREQUENCY AND DURATION 06/23/2017 Speech Therapy Frequency (ACUTE ONLY) min 2x/week Treatment Duration 2 weeks      CHL IP ORAL PHASE 03/17/2018 Oral Phase WFL Oral - Pudding Teaspoon -- Oral - Pudding Cup -- Oral - Honey Teaspoon -- Oral - Honey Cup -- Oral - Nectar Teaspoon -- Oral - Nectar Cup -- Oral - Nectar Straw -- Oral - Thin Teaspoon -- Oral - Thin Cup -- Oral - Thin Straw -- Oral - Puree -- Oral - Mech Soft -- Oral - Regular -- Oral - Multi-Consistency -- Oral - Pill -- Oral Phase - Comment --  CHL IP PHARYNGEAL PHASE 03/17/2018 Pharyngeal Phase Impaired Pharyngeal- Pudding Teaspoon -- Pharyngeal -- Pharyngeal- Pudding Cup -- Pharyngeal -- Pharyngeal- Honey Teaspoon -- Pharyngeal -- Pharyngeal- Honey Cup Delayed swallow initiation-vallecula;Penetration/Aspiration during swallow;Pharyngeal residue - valleculae;Compensatory strategies attempted (with notebox);Reduced airway/laryngeal closure Pharyngeal Material enters airway, remains ABOVE vocal cords and not  ejected out Pharyngeal- Nectar Teaspoon -- Pharyngeal -- Pharyngeal- Nectar Cup Delayed swallow initiation-pyriform sinuses;Penetration/Aspiration during swallow;Significant aspiration (Amount);Compensatory strategies attempted (with notebox);Pharyngeal residue - valleculae;Delayed swallow initiation-vallecula;Reduced  airway/laryngeal closure Pharyngeal Material enters airway, remains ABOVE vocal cords and not ejected out;Material enters airway, passes BELOW cords without attempt by patient to eject out (silent aspiration) Pharyngeal- Nectar Straw -- Pharyngeal -- Pharyngeal- Thin Teaspoon -- Pharyngeal -- Pharyngeal- Thin Cup Delayed swallow initiation-pyriform sinuses;Penetration/Aspiration during swallow;Reduced airway/laryngeal closure Pharyngeal Material enters airway, remains ABOVE vocal cords and not ejected out;Material enters airway, passes BELOW cords and not ejected out despite cough attempt by patient Pharyngeal- Thin Straw -- Pharyngeal -- Pharyngeal- Puree -- Pharyngeal -- Pharyngeal- Mechanical Soft -- Pharyngeal -- Pharyngeal- Regular -- Pharyngeal -- Pharyngeal- Multi-consistency -- Pharyngeal -- Pharyngeal- Pill -- Pharyngeal -- Pharyngeal Comment --  CHL IP CERVICAL ESOPHAGEAL PHASE 03/17/2018 Cervical Esophageal Phase WFL Pudding Teaspoon -- Pudding Cup -- Honey Teaspoon -- Honey Cup -- Nectar Teaspoon -- Nectar Cup -- Nectar Straw -- Thin Teaspoon -- Thin Cup -- Thin Straw -- Puree -- Mechanical Soft -- Regular -- Multi-consistency -- Pill -- Cervical Esophageal Comment -- Royce Macadamia 03/17/2018, 3:50 PM Breck Coons [employer] administrator Pager [phone] Office [phone]               Assessment/Plan  1. C. difficile colitis -stool culture was positive for C. difficile, continue metronidazole 500 mg 1 tab 3 times a day (stop date 04/19/18)   2. Uncontrolled type 2 diabetes mellitus with hyperglycemia, with long-term current use of insulin (HCC) -continue  Lantus 100 units/mL inject 35 units subcu nightly, metformin 1000 mg 1 tab twice a day, NovoLog 100 units/mL inject 5 units subcutaneous before lunch and dinner, will re-check hgbA1c Lab Results  Component Value Date   HGBA1C 11.1 (H) 12/26/2017     3. Depression, recurrent (HCC) - mood is stable, continue sertraline 50 mg 1 tab twice a day   4. Anxiety - mood is stable, continue Ativan 0.5 mg 1 tab twice a day   5. Insomnia, unspecified type -  will continue melatonin 10 mg 1 tab nightly and trazodone 50 mg 1 tab nightly   6. Hyperlipidemia, unspecified hyperlipidemia type -continue atorvastatin 10 mg 1 tab nightly    Family/ staff Communication: Discussed plan of care with resident.  Labs/tests ordered:  HgbA1c, CBC  Goals of care:   Long-term care.   Kenard Gower, NP [facility] [phone] (Monday-Friday 8:00 a.m. - 5:00 p.m.) [phone] (after hours)

## 2018-04-16 DIAGNOSIS — E119 Type 2 diabetes mellitus without complications: Secondary | ICD-10-CM | POA: Diagnosis not present

## 2018-04-16 DIAGNOSIS — D649 Anemia, unspecified: Secondary | ICD-10-CM | POA: Diagnosis not present

## 2018-04-16 DIAGNOSIS — Z79899 Other long term (current) drug therapy: Secondary | ICD-10-CM | POA: Diagnosis not present

## 2018-04-16 LAB — CBC AND DIFFERENTIAL
HCT: 35 — AB (ref 41–53)
Hemoglobin: 11.8 — AB (ref 13.5–17.5)
Neutrophils Absolute: 4
PLATELETS: 254 (ref 150–399)
WBC: 7.6

## 2018-04-16 LAB — HEMOGLOBIN A1C: HEMOGLOBIN A1C: 7.9 — AB (ref 4.0–6.0)

## 2018-04-20 ENCOUNTER — Other Ambulatory Visit: Payer: Self-pay

## 2018-04-20 MED ORDER — LORAZEPAM 1 MG PO TABS: 0.5000 mg | ORAL_TABLET | Freq: Two times a day (BID) | ORAL | 0 refills | Status: DC

## 2018-04-20 NOTE — Telephone Encounter (Signed)
Pharmacy form faxed to [facility] Fax #[phone]

## 2018-04-23 ENCOUNTER — Non-Acute Institutional Stay (SKILLED_NURSING_FACILITY): Payer: Medicare Other

## 2018-04-23 DIAGNOSIS — Z0000 Encounter for general adult medical examination without abnormal findings: Secondary | ICD-10-CM | POA: Diagnosis not present

## 2018-04-23 NOTE — Progress Notes (Addendum)
Subjective:   Matai Carpenito. is a 72 y.o. male who presents for an Initial Medicare Annual Wellness Visit at [facility]      Objective:    Today's Vitals   04/23/18 1059  BP: 128/70  Pulse: 75  Temp: 98 F (36.7 C)  TempSrc: Oral  Weight: 191 lb (86.6 kg)  Height: 5\' 10"  (1.778 m)   Body mass index is 27.41 kg/m.  Advanced Directives 04/23/2018 02/26/2018 02/03/2018 12/27/2017 12/26/2017 11/26/2017 08/08/2017  Does Patient Have a Medical Advance Directive? Yes Yes No - Yes No Yes  Type of Advance Directive Out of facility DNR (pink MOST or yellow form) Out of facility DNR (pink MOST or yellow form) - Out of facility DNR (pink MOST or yellow form) Out of facility DNR (pink MOST or yellow form) - Midwife;Living will  Does patient want to make changes to medical advance directive? No - Patient declined No - Patient declined - No - Patient declined - - -  Copy of Healthcare Power of Attorney in Chart? - - - - - - -  Would patient like information on creating a medical advance directive? - - - - - No - Patient declined -  Pre-existing out of facility DNR order (yellow form or pink MOST form) Yellow form placed in chart (order not valid for inpatient use) - - Yellow form placed in chart (order not valid for inpatient use) - - -    Current Medications (verified) Outpatient Encounter Medications as of 04/23/2018  Medication Sig  . acetaminophen (TYLENOL) 650 MG CR tablet Take 650 mg by mouth 3 (three) times daily.   Marland Kitchen alendronate (FOSAMAX) 70 MG tablet Take 70 mg by mouth once a week. Take with a full glass of water on an empty stomach.  . ASPERCREME LIDOCAINE EX Apply 1 application topically 3 (three) times daily as needed. Apply to elbow and wrist for pain  . aspirin EC 81 MG tablet Take 81 mg by mouth every morning.   Marland Kitchen atorvastatin (LIPITOR) 10 MG tablet Take 10 mg by mouth at bedtime.   . Cholecalciferol (QC VITAMIN D3) 2000 units TABS Take 1 tablet  by mouth daily.  . insulin aspart (NOVOLOG) 100 UNIT/ML injection Inject 5 Units into the skin 2 (two) times daily. Before lunch and dinner.  . insulin glargine (LANTUS) 100 UNIT/ML injection Inject 35 Units into the skin at bedtime.   Marland Kitchen LORazepam (ATIVAN) 1 MG tablet Take 0.5 tablets (0.5 mg total) by mouth 2 (two) times daily.  . Melatonin 10 MG TABS Take 10 mg by mouth at bedtime.  . metFORMIN (GLUCOPHAGE) 500 MG tablet Take 1,000 mg by mouth 2 (two) times daily with a meal.  . Nutritional Supplements (NUTRITIONAL SUPPLEMENT PO) Take 1 each by mouth 2 (two) times daily. Magic Cup  . sertraline (ZOLOFT) 50 MG tablet Take 50 mg by mouth 2 (two) times daily.   . Skin Protectants, Misc. (EUCERIN) cream Apply topically as needed for dry skin.  . tamsulosin (FLOMAX) 0.4 MG CAPS capsule Take 0.8 mg by mouth every evening. Take 2 capsules to = 0.8 mg  . traZODone (DESYREL) 50 MG tablet TAKE ONE TABLET (50 MG TOTAL) BY MOUTH AT BEDTIME.   No facility-administered encounter medications on file as of 04/23/2018.     Allergies (verified) Penicillins   History: Past Medical History:  Diagnosis Date  . Anemia   . Arthritis   . Dementia (HCC)   .  Diabetes mellitus without complication (HCC)   . ETOH abuse   . Myocardial infarction (HCC)   . Peripheral arterial disease (HCC)   . Stroke (HCC)   . Wears dentures   . Wears glasses    Past Surgical History:  Procedure Laterality Date  . ABDOMINAL AORTOGRAM N/A 10/25/2016   Procedure: Abdominal Aortogram;  Surgeon: Sherren Kerns, MD;  Location: [facility] INVASIVE CV LAB;  Service: Cardiovascular;  Laterality: N/A;  . BRAIN SURGERY    . CORONARY STENT PLACEMENT     " about 25 years ago (10/29/16)  . ENDARTERECTOMY FEMORAL Right 10/30/2016   Procedure: Eduardo Osier WITH PATCH GRAFT;  Surgeon: Sherren Kerns, MD;  Location: [facility] OR;  Service: Vascular;  Laterality: Right;  . IR NEPHRO TUBE REMOV/FL  07/04/2017  . IR NEPHRO TUBE REMOV/FL   07/07/2017  . IR NEPHROSTOMY EXCHANGE LEFT  06/20/2017  . IR NEPHROSTOMY EXCHANGE RIGHT  06/20/2017  . IR NEPHROSTOMY PLACEMENT LEFT  06/12/2017  . IR NEPHROSTOMY PLACEMENT RIGHT  06/12/2017  . IR URETERAL STENT PLACEMENT EXISTING ACCESS LEFT  06/20/2017  . IR URETERAL STENT PLACEMENT EXISTING ACCESS RIGHT  06/20/2017  . LOWER EXTREMITY ANGIOGRAPHY Right 10/25/2016   Procedure: Lower Extremity Angiography;  Surgeon: Sherren Kerns, MD;  Location: [facility] INVASIVE CV LAB;  Service: Cardiovascular;  Laterality: Right;   Family History  Problem Relation Age of Onset  . Diabetes Mother   . Heart disease Father   . Heart disease Brother    Social History   Socioeconomic History  . Marital status: Married    Spouse name: Not on file  . Number of children: Not on file  . Years of education: Not on file  . Highest education level: Not on file  Occupational History  . Not on file  Social Needs  . Financial resource strain: Not hard at all  . Food insecurity:    Worry: Never true    Inability: Never true  . Transportation needs:    Medical: No    Non-medical: No  Tobacco Use  . Smoking status: Former Smoker    Types: Cigarettes  . Smokeless tobacco: Never Used  . Tobacco comment: Quit smoking cigarettes 10/ 2017  Substance and Sexual Activity  . Alcohol use: No    Comment: Alcohol abuse PMH  . Drug use: No  . Sexual activity: Not on file  Lifestyle  . Physical activity:    Days per week: 0 days    Minutes per session: 0 min  . Stress: Not at all  Relationships  . Social connections:    Talks on phone: Never    Gets together: Twice a week    Attends religious service: Never    Active member of club or organization: No    Attends meetings of clubs or organizations: Never    Relationship status: Married  Other Topics Concern  . Not on file  Social History Narrative  . Not on file   Tobacco Counseling Counseling given: Not Answered Comment: Quit smoking cigarettes 10/  2017   Clinical Intake:  Pre-visit preparation completed: No  Pain : No/denies pain     Diabetes: Yes CBG done?: No Did pt. bring in CBG monitor from home?: No  How often do you need to have someone help you when you read instructions, pamphlets, or other written materials from your doctor or pharmacy?: 3 - Sometimes What is the last grade level you completed in school?: High School  Interpreter Needed?:  No  Information entered by :: Tyron Russell, RN  Activities of Daily Living In your present state of health, do you have any difficulty performing the following activities: 04/23/2018 12/26/2017  Hearing? Y N  Vision? N N  Difficulty concentrating or making decisions? Malvin Johns  Walking or climbing stairs? Y Y  Dressing or bathing? Y Y  Doing errands, shopping? Malvin Johns  Preparing Food and eating ? Y -  Using the Toilet? Y -  In the past six months, have you accidently leaked urine? Y -  Do you have problems with loss of bowel control? Y -  Managing your Medications? Y -  Managing your Finances? Y -  Housekeeping or managing your Housekeeping? Y -  Some recent data might be hidden     Immunizations and Health Maintenance Immunization History  Administered Date(s) Administered  . Influenza, High Dose Seasonal PF 03/21/2015, 08/19/2016  . Pneumococcal Conjugate-13 10/03/2014  . Rotavirus Pentavalent 03/22/1998   Health Maintenance Due  Topic Date Due  . Hepatitis C Screening  11/14/1945  . FOOT EXAM  01/20/1958  . OPHTHALMOLOGY EXAM  01/20/1958  . URINE MICROALBUMIN  01/20/1958  . TETANUS/TDAP  01/21/1967  . COLONOSCOPY  01/20/1998  . PNA vac Low Risk Adult (2 of 2 - PPSV23) 10/03/2015  . INFLUENZA VACCINE  01/22/2018    Patient Care Team: Pecola Lawless, MD as PCP - General (Internal Medicine) Medina-Vargas, Margit Banda, NP as Nurse Practitioner (Internal Medicine)  Indicate any recent Medical Services you may have received from other than [facility] providers in the past  year (date may be approximate).    Assessment:   This is a routine wellness examination for [facility].  Hearing/Vision screen No exam data present  Dietary issues and exercise activities discussed: Current Exercise Habits: The patient does not participate in regular exercise at present, Exercise limited by: orthopedic condition(s)  Goals   None    Depression Screen PHQ 2/9 Scores 04/23/2018  PHQ - 2 Score 1    Fall Risk Fall Risk  04/23/2018  Falls in the past year? No    Is the patient's home free of loose throw rugs in walkways, pet beds, electrical cords, etc?   yes      Grab bars in the bathroom? yes      Handrails on the stairs?   yes      Adequate lighting?   yes  Cognitive Function:     6CIT Screen 04/23/2018  What Year? 4 points  What month? 3 points  What time? 0 points  Count back from 20 4 points  Months in reverse 4 points  Repeat phrase 10 points  Total Score 25    Screening Tests Health Maintenance  Topic Date Due  . Hepatitis C Screening  January 14, 1946  . FOOT EXAM  01/20/1958  . OPHTHALMOLOGY EXAM  01/20/1958  . URINE MICROALBUMIN  01/20/1958  . TETANUS/TDAP  01/21/1967  . COLONOSCOPY  01/20/1998  . PNA vac Low Risk Adult (2 of 2 - PPSV23) 10/03/2015  . INFLUENZA VACCINE  01/22/2018  . HEMOGLOBIN A1C  10/16/2018    Qualifies for Shingles Vaccine? Not in past records  Cancer Screenings: Lung: Low Dose CT Chest recommended if Age 55-80 years, 30 pack-year currently smoking OR have quit w/in 15years. Patient does not qualify. Colorectal: due, ordered. Confirmed with HCPOA  Additional Screenings:  Hepatitis C Screening: declined Flu vaccine due: will receive at [facility] Pneumovax due: ordered TDAP due: ordered Diabetic eye exam due:  ordered     Plan:    I have personally reviewed and addressed the Medicare Annual Wellness questionnaire and have noted the following in the patient's chart:  A. Medical and social history B. Use of alcohol,  tobacco or illicit drugs  C. Current medications and supplements D. Functional ability and status E.  Nutritional status F.  Physical activity G. Advance directives H. List of other physicians I.  Hospitalizations, surgeries, and ER visits in previous 12 months J.  Vitals K. Screenings to include hearing, vision, cognitive, depression L. Referrals and appointments - none  In addition, I have reviewed and discussed with patient certain preventive protocols, quality metrics, and best practice recommendations. A written personalized care plan for preventive services as well as general preventive health recommendations were provided to patient.  See attached scanned questionnaire for additional information.   Signed,   Tyron Russell, RN Nurse Health Advisor  Patient Concerns: None I have personally reviewed the health advisor's clinical note, was available for consultation, and agree with the assessment and plan as written. Pecola Lawless M.D., FACP, [facility]

## 2018-04-23 NOTE — Patient Instructions (Addendum)
Alan [name] , I have completed the annual wellness visit as per Medicare guidelines. The attached information is provided for the patient's family, care providers, and facility of residence.  Screening recommendations/referrals: Colonoscopy due, ordered (confirmed with HCPOA) Recommended yearly ophthalmology/optometry visit for glaucoma screening and checkup Recommended yearly dental visit for hygiene and checkup  Vaccinations: Influenza vaccine due, will receive at [facility] Pneumococcal vaccine 23 due, ordered Tdap vaccine due, ordered Shingles vaccine not in past records    Advanced directives: in chart  Conditions/risks identified: Fall Risk  Next appointment: Dr. Alwyn Ren makes rounds  Preventive Care 65 Years and Older, Male Preventive care refers to lifestyle choices and visits with your health care provider that can promote health and wellness. What does preventive care include?  A yearly physical exam. This is also called an annual well check.  Dental exams once or twice a year.  Routine eye exams. Ask your health care provider how often you should have your eyes checked.  Personal lifestyle choices, including:  Daily care of your teeth and gums.  Regular physical activity.  Eating a healthy diet.  Avoiding tobacco and drug use.  Limiting alcohol use.  Taking vitamin and mineral supplements as recommended by your health care provider. What happens during an annual well check? The services and screenings done by your health care provider during your annual well check will depend on your age, overall health, lifestyle risk factors, and family history of disease. Counseling  Your health care provider may ask you questions about your:  Alcohol use.  Tobacco use.  Drug use.  Emotional well-being.  Home and relationship well-being.  Sexual activity.  Eating habits.  History of falls.  Memory and ability to understand (cognition).  Work and work  Astronomer. Screening  You may have the following tests or measurements:  Height, weight, and BMI.  Blood pressure.  Lipid and cholesterol levels. These may be checked every 5 years, or more frequently if you are over 25 years old.  Skin check.  Lung cancer screening. You may have this screening every year starting at age 64 if you have a 30-pack-year history of smoking and currently smoke or have quit within the past 15 years.  Fecal occult blood test (FOBT) of the stool. You may have this test every year starting at age 46.  Flexible sigmoidoscopy or colonoscopy. You may have a sigmoidoscopy every 5 years or a colonoscopy every 10 years starting at age 61.  Prostate cancer screening. Recommendations will vary depending on your family history and other risks.  Hepatitis C blood test.  Hepatitis B blood test.  Diabetes screening. This is done by checking your blood sugar (glucose) after you have not eaten for a while (fasting). You may have this done every 1-3 years.  Abdominal aortic aneurysm (AAA) screening. You may need this if you are a current or former smoker.  Osteoporosis. You may be screened starting at age 49 if you are at high risk. Talk with your health care provider about your test results, treatment options, and if necessary, the need for more tests. Vaccines  Your health care provider may recommend certain vaccines, such as:  Influenza vaccine. This is recommended every year.  Tetanus, diphtheria, and acellular pertussis (Tdap, Td) vaccine. You may need a Td booster every 10 years.  Zoster vaccine. You may need this after age 78.  Pneumococcal 13-valent conjugate (PCV13) vaccine. One dose is recommended after age 26.  Pneumococcal polysaccharide (PPSV23) vaccine. One dose is recommended  after age 81. Talk to your health care provider about which screenings and vaccines you need and how often you need them. This information is not intended to replace advice  given to you by your health care provider. Make sure you discuss any questions you have with your health care provider. Document Released: 07/07/2015 Document Revised: 02/28/2016 Document Reviewed: 04/11/2015 Elsevier Interactive Patient Education  2017 [employer].  Fall Prevention Falls can cause injuries. They can happen to people of all ages. There are many things you can do to make your home safe and to help prevent falls.  What can I do in the bathroom?  Use night lights.  Install grab bars by the toilet and in the tub and shower. Do not use towel bars as grab bars.  Use non-skid mats or decals in the tub or shower.  If you need to sit down in the shower, use a plastic, non-slip stool.  Keep the floor dry. Clean up any water that spills on the floor as soon as it happens.  Remove soap buildup in the tub or shower regularly.  Attach bath mats securely with double-sided non-slip rug tape.  Do not have throw rugs and other things on the floor that can make you trip. What can I do in the bedroom?  Use night lights.  Make sure that you have a light by your bed that is easy to reach.  Do not use any sheets or blankets that are too big for your bed. They should not hang down onto the floor.  Have a firm chair that has side arms. You can use this for support while you get dressed.  Do not have throw rugs and other things on the floor that can make you trip. What else can I do to help prevent falls?  Wear shoes that:  Do not have high heels.  Have rubber bottoms.  Are comfortable and fit you well.  Are closed at the toe. Do not wear sandals.  If you use a stepladder:  Make sure that it is fully opened. Do not climb a closed stepladder.  Make sure that both sides of the stepladder are locked into place.  Ask someone to hold it for you, if possible.  Clearly mark and make sure that you can see:  Any grab bars or handrails.  First and last steps.  Where the  edge of each step is.  Use tools that help you move around (mobility aids) if they are needed. These include:  Canes.  Walkers.  Scooters.  Crutches.  Turn on the lights when you go into a dark area. Replace any light bulbs as soon as they burn out.  Set up your furniture so you have a clear path. Avoid moving your furniture around.  If any of your floors are uneven, fix them.  Review your medicines with your doctor. Some medicines can make you feel dizzy. This can increase your chance of falling. Ask your doctor what other things that you can do to help prevent falls. This information is not intended to replace advice given to you by your health care provider. Make sure you discuss any questions you have with your health care provider. Document Released: 04/06/2009 Document Revised: 11/16/2015 Document Reviewed: 07/15/2014 Elsevier Interactive Patient Education  2017 [employer].

## 2018-04-24 DIAGNOSIS — Z23 Encounter for immunization: Secondary | ICD-10-CM | POA: Diagnosis not present

## 2018-04-29 DIAGNOSIS — A0472 Enterocolitis due to Clostridium difficile, not specified as recurrent: Secondary | ICD-10-CM | POA: Diagnosis not present

## 2018-04-29 DIAGNOSIS — Z79899 Other long term (current) drug therapy: Secondary | ICD-10-CM | POA: Diagnosis not present

## 2018-05-01 ENCOUNTER — Other Ambulatory Visit: Payer: Self-pay

## 2018-05-01 MED ORDER — LORAZEPAM 1 MG PO TABS: 0.5000 mg | ORAL_TABLET | Freq: Two times a day (BID) | ORAL | 0 refills | Status: DC

## 2018-05-01 NOTE — Telephone Encounter (Signed)
Hard script written by Kenard Gower, NP.  Gave to nurse.

## 2018-05-06 DIAGNOSIS — E1142 Type 2 diabetes mellitus with diabetic polyneuropathy: Secondary | ICD-10-CM | POA: Diagnosis not present

## 2018-05-06 DIAGNOSIS — A4189 Other specified sepsis: Secondary | ICD-10-CM | POA: Diagnosis not present

## 2018-05-06 DIAGNOSIS — D649 Anemia, unspecified: Secondary | ICD-10-CM | POA: Diagnosis not present

## 2018-05-06 DIAGNOSIS — B961 Klebsiella pneumoniae [K. pneumoniae] as the cause of diseases classified elsewhere: Secondary | ICD-10-CM | POA: Diagnosis not present

## 2018-05-06 LAB — BASIC METABOLIC PANEL
BUN: 12 (ref 4–21)
Creatinine: 0.6 (ref 0.6–1.3)
GLUCOSE: 226
Potassium: 2.9 — AB (ref 3.4–5.3)
SODIUM: 146 (ref 137–147)

## 2018-05-07 ENCOUNTER — Encounter: Payer: Self-pay | Admitting: Internal Medicine

## 2018-05-07 ENCOUNTER — Non-Acute Institutional Stay (SKILLED_NURSING_FACILITY): Payer: Medicare Other | Admitting: Internal Medicine

## 2018-05-07 DIAGNOSIS — E876 Hypokalemia: Secondary | ICD-10-CM | POA: Diagnosis not present

## 2018-05-07 DIAGNOSIS — A0472 Enterocolitis due to Clostridium difficile, not specified as recurrent: Secondary | ICD-10-CM | POA: Insufficient documentation

## 2018-05-07 DIAGNOSIS — E1165 Type 2 diabetes mellitus with hyperglycemia: Secondary | ICD-10-CM

## 2018-05-07 DIAGNOSIS — Z794 Long term (current) use of insulin: Secondary | ICD-10-CM | POA: Diagnosis not present

## 2018-05-07 NOTE — Assessment & Plan Note (Addendum)
C. difficile +04/29/2018  completing second course of metronidazole as of 01/06/2018 Initiate vancomycin PO GI referral

## 2018-05-07 NOTE — Assessment & Plan Note (Addendum)
Fasting glucoses adequately controlled but mealtime glucoses high intermittently, especially with the evening meal Short acting insulin with meals will be adjusted with dose increased to 10 units before lunch and evening meal  Decrease of basal insulin to 30 units

## 2018-05-07 NOTE — Progress Notes (Signed)
    NURSING HOME LOCATION:  [facility] ROOM NUMBER:  [id number]  CODE STATUS:  DNR  PCP:  Pecola LawlessHopper, William F, MD  [address] [place] [id number]  This is a nursing facility follow up for specific acute issue of persistant C. difficile with associated hypokalemia.  Interim medical record and care since last [facility] visit was updated with review of diagnostic studies and change in clinical status since last visit were documented.  HPI: He has had 2 courses of Flagyl, he is to complete the second course 05/09/18.  He has not been on vancomycin.  Potassium is 2.9 ;glucose was 226.  Here at the SNF the glucoses have ranged from a low of 93 up to a high of 353.  Fasting glucoses have been well controlled and the highest glucoses relate to the evening meal. He is on NovoLog 5 units before lunch and dinner as well as 35 units of basal insulin at bedtime.  Review of systems: Dementia invalidated responses. Date given as September.  His most recent mental status test revealed a score 13 out of 30 compatible with significant dementia.   He could not validate how many bowel movements he is having a day but simply responded "all day".  His wife states that the stools are yellow and liquid to pasty.  The staff validates this description.  She states that he also complains of abdominal pain but cannot quantitate or characterize the pain. She is bringing in Pedialyte as his appetite has been poor His wife has been instructed as to isolation procedures repeatedly but continues to take him out of the room.  In fact today I could not find him for the interview and exam as she was wheeling him down the halls. 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  Physical exam:  Pertinent or positive findings: He tends to have a blank expression to his face with mouth agape.  Pattern  alopecia is present.  He has a IT consultantgoatee.  The right nasolabial fold is decreased.  He is edentulous.  Breath sounds are decreased.  Minor rales were noted at the bases.  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  The right pedal pulses are decreased.  Minimal tenting is present.  General appearance: no acute distress, increased work of breathing is present.   Lymphatic: No lymphadenopathy about the head, neck, axilla. Eyes: No conjunctival inflammation or lid edema is present. There is no scleral icterus. Ears:  External ear exam shows no significant lesions or deformities.   Nose:  External nasal examination shows no deformity or inflammation. Nasal mucosa are pink and moist without lesions, exudates Oral exam:  Lips and gums are healthy appearing. There is no oropharyngeal erythema or exudate. Neck:  No thyromegaly, masses, tenderness noted.    Heart:  No gallop, murmur, click, rub .  Lungs: without wheezes, rhonchi, rubs. Abdomen: Bowel sounds are normal. Abdomen is soft and nontender with no organomegaly, hernias, masses. GU: Deferred  Extremities:  No cyanosis, clubbing, edema  Skin: Warm & dry. No significant lesions or rash.  See summary under each active problem in the Problem List with associated updated therapeutic plan

## 2018-05-07 NOTE — Patient Instructions (Addendum)
See assessment and plan under each diagnosis in the problem list and acutely for this visit Total time 43  minutes; greater than 50% of the visit spent counseling patient's wife as to pathophysiology of C difficile infection and potential risk to his health and life as well as to staff & other residents of C. difficile and coordinating care for problems addressed at this encounter. GI referral will be made locally.  [place] and logistically it would be extremely difficult to get [place]

## 2018-05-11 ENCOUNTER — Encounter: Payer: Self-pay | Admitting: Adult Health

## 2018-05-11 ENCOUNTER — Non-Acute Institutional Stay (SKILLED_NURSING_FACILITY): Payer: Medicare Other | Admitting: Adult Health

## 2018-05-11 DIAGNOSIS — Z794 Long term (current) use of insulin: Secondary | ICD-10-CM | POA: Diagnosis not present

## 2018-05-11 DIAGNOSIS — Z79899 Other long term (current) drug therapy: Secondary | ICD-10-CM | POA: Diagnosis not present

## 2018-05-11 DIAGNOSIS — A0472 Enterocolitis due to Clostridium difficile, not specified as recurrent: Secondary | ICD-10-CM | POA: Diagnosis not present

## 2018-05-11 DIAGNOSIS — R630 Anorexia: Secondary | ICD-10-CM

## 2018-05-11 DIAGNOSIS — R1310 Dysphagia, unspecified: Secondary | ICD-10-CM | POA: Diagnosis not present

## 2018-05-11 DIAGNOSIS — R339 Retention of urine, unspecified: Secondary | ICD-10-CM | POA: Diagnosis not present

## 2018-05-11 DIAGNOSIS — D649 Anemia, unspecified: Secondary | ICD-10-CM | POA: Diagnosis not present

## 2018-05-11 DIAGNOSIS — F339 Major depressive disorder, recurrent, unspecified: Secondary | ICD-10-CM | POA: Diagnosis not present

## 2018-05-11 DIAGNOSIS — G4700 Insomnia, unspecified: Secondary | ICD-10-CM

## 2018-05-11 DIAGNOSIS — F419 Anxiety disorder, unspecified: Secondary | ICD-10-CM | POA: Diagnosis not present

## 2018-05-11 DIAGNOSIS — E876 Hypokalemia: Secondary | ICD-10-CM | POA: Diagnosis not present

## 2018-05-11 DIAGNOSIS — E1165 Type 2 diabetes mellitus with hyperglycemia: Secondary | ICD-10-CM | POA: Diagnosis not present

## 2018-05-11 LAB — BASIC METABOLIC PANEL
BUN: 10 (ref 4–21)
Creatinine: 0.5 — AB (ref 0.6–1.3)
GLUCOSE: 113
Potassium: 2.9 — AB (ref 3.4–5.3)
Sodium: 144 (ref 137–147)

## 2018-05-11 NOTE — Progress Notes (Signed)
Location:  [facility] Room Number: 211-A Place of Service:  SNF (31) Provider:  Kenard Gower, NP  Patient Care Team: Pecola Lawless, MD as PCP - General (Internal Medicine) Gillis Santa, NP as Nurse Practitioner (Internal Medicine)  Extended Emergency Contact Information Primary Emergency Contact: Arita [place] Home Phone: [phone] Mobile Phone: [phone] Relation: Spouse Secondary Emergency Contact: Lanae Crumbly Mobile Phone: [phone] Relation: Daughter  Code Status:  DNR  Goals of care: Advanced Directive information Advanced Directives 05/07/2018  Does Patient Have a Medical Advance Directive? Yes  Type of Advance Directive Out of facility DNR (pink MOST or yellow form)  Does patient want to make changes to medical advance directive? No - Patient declined  Copy of Healthcare Power of Attorney in Chart? -  Would patient like information on creating a medical advance directive? -  Pre-existing out of facility DNR order (yellow form or pink MOST form) -     Chief Complaint  Patient presents with  . Medical Management of Chronic Issues    Routine [facility] visit    HPI:  Pt is a 72 y.o. Alan [name] seen today for medical management of chronic diseases.  He is a long-term care resident of [facility].  He has a PMH of CAD, stroke, PAD, MI, and alcohol abuse. He recently finished Metronidazole treatment for C-difficile colitis. He was noted to continue having diarrhea and a repeat stool culture showed positive for C. Difficile. He was restarted on Metronidazole and then shifted to oral Vancomycin solution. He was recently supplemented with Potassium due to hypokalemia. A repeat BMP was done today and awaiting result.   Past Medical History:  Diagnosis Date  . Anemia   . Arthritis   . Dementia (HCC)   . Diabetes mellitus without complication (HCC)   . ETOH abuse   .  Myocardial infarction (HCC)   . Peripheral arterial disease (HCC)   . Stroke (HCC)   . Wears dentures   . Wears glasses    Past Surgical History:  Procedure Laterality Date  . ABDOMINAL AORTOGRAM N/A 10/25/2016   Procedure: Abdominal Aortogram;  Surgeon: Sherren Kerns, MD;  Location: [facility] INVASIVE CV LAB;  Service: Cardiovascular;  Laterality: N/A;  . BRAIN SURGERY    . COLONOSCOPY  2015   Negative, performed by GI in [place]  . CORONARY STENT PLACEMENT     " about 25 years ago (10/29/16)  . ENDARTERECTOMY FEMORAL Right 10/30/2016   Procedure: Eduardo Osier WITH PATCH GRAFT;  Surgeon: Sherren Kerns, MD;  Location: [facility] OR;  Service: Vascular;  Laterality: Right;  . IR NEPHRO TUBE REMOV/FL  07/04/2017  . IR NEPHRO TUBE REMOV/FL  07/07/2017  . IR NEPHROSTOMY EXCHANGE LEFT  06/20/2017  . IR NEPHROSTOMY EXCHANGE RIGHT  06/20/2017  . IR NEPHROSTOMY PLACEMENT LEFT  06/12/2017  . IR NEPHROSTOMY PLACEMENT RIGHT  06/12/2017  . IR URETERAL STENT PLACEMENT EXISTING ACCESS LEFT  06/20/2017  . IR URETERAL STENT PLACEMENT EXISTING ACCESS RIGHT  06/20/2017  . LOWER EXTREMITY ANGIOGRAPHY Right 10/25/2016   Procedure: Lower Extremity Angiography;  Surgeon: Sherren Kerns, MD;  Location: [facility] INVASIVE CV LAB;  Service: Cardiovascular;  Laterality: Right;    Allergies  Allergen Reactions  . Penicillins Rash    Has patient had a PCN reaction causing immediate rash, facial/tongue/throat swelling, SOB or lightheadedness with hypotension: Yes Has patient had a PCN reaction causing severe rash involving mucus membranes or skin necrosis: No  Has patient had a PCN reaction that required hospitalization No Has patient had a PCN reaction occurring within the last 10 years: No If all of the above answers are "NO", then may proceed with Cephalosporin use.     Outpatient Encounter Medications as of 05/11/2018  Medication Sig  . acetaminophen (TYLENOL) 650 MG CR tablet Take 650 mg by mouth 3  (three) times daily.   Marland Kitchen. alendronate (FOSAMAX) 70 MG tablet Take 70 mg by mouth once a week. Take with a full glass of water on an empty stomach.  . ASPERCREME LIDOCAINE EX Apply 1 application topically 3 (three) times daily as needed. Apply to elbow and wrist for pain  . aspirin EC 81 MG tablet Take 81 mg by mouth every morning.   Marland Kitchen. atorvastatin (LIPITOR) 10 MG tablet Take 10 mg by mouth at bedtime.   . Cholecalciferol (QC VITAMIN D3) 2000 units TABS Take 1 tablet by mouth daily.  Marland Kitchen. geriatric multivitamins-minerals (ELDERTONIC/GEVRABON) LIQD Take 15 mLs by mouth 2 (two) times daily.  . insulin aspart (NOVOLOG) 100 UNIT/ML injection Inject 10 Units into the skin 2 (two) times daily. Before lunch and dinner.   . insulin glargine (LANTUS) 100 UNIT/ML injection Inject 30 Units into the skin at bedtime.   Marland Kitchen. LORazepam (ATIVAN) 1 MG tablet Take 0.5 tablets (0.5 mg total) by mouth 2 (two) times daily.  . Melatonin 10 MG TABS Take 10 mg by mouth at bedtime.  . metFORMIN (GLUCOPHAGE) 1000 MG tablet Take 1,000 mg by mouth 2 (two) times daily with a meal.  . Nutritional Supplements (NUTRITIONAL SUPPLEMENT PO) Take 1 each by mouth 2 (two) times daily. Magic Cup  . potassium chloride SA (K-DUR,KLOR-CON) 20 MEQ tablet Take 20 mEq by mouth daily.  Marland Kitchen. saccharomyces boulardii (FLORASTOR) 250 MG capsule Take 250 mg by mouth 2 (two) times daily.  . sertraline (ZOLOFT) 50 MG tablet Take 50 mg by mouth 2 (two) times daily.   . Skin Protectants, Misc. (EUCERIN) cream Apply topically as needed for dry skin.  . tamsulosin (FLOMAX) 0.4 MG CAPS capsule Take 0.8 mg by mouth every evening. Take 2 capsules to = 0.8 mg  . traZODone (DESYREL) 50 MG tablet TAKE ONE TABLET (50 MG TOTAL) BY MOUTH AT BEDTIME.  Marland Kitchen. trolamine salicylate (ASPERCREME) 10 % cream Apply 1 application topically 2 (two) times daily. Apply to lower back  . vancomycin (VANCOCIN) 125 MG capsule Take 125 mg by mouth 4 (four) times daily.  . [DISCONTINUED]  metFORMIN (GLUCOPHAGE) 500 MG tablet Take 1,000 mg by mouth 2 (two) times daily with a meal.   No facility-administered encounter medications on file as of 05/11/2018.     Review of Systems  GENERAL: No change in appetite, no fatigue, no weight changes, no fever, chills or weakness MOUTH and THROAT: Denies oral discomfort, gingival pain or bleeding RESPIRATORY: no cough, SOB, DOE, wheezing, hemoptysis CARDIAC: No chest pain, edema or palpitations GI: +diarrhea GU: Denies dysuria, frequency, hematuria, incontinence, or discharge PSYCHIATRIC: Denies feelings of depression or anxiety. No report of hallucinations, insomnia, paranoia, or agitation   Immunization History  Administered Date(s) Administered  . Influenza, High Dose Seasonal PF 03/21/2015, 08/19/2016  . Influenza-Unspecified 04/24/2018  . Pneumococcal Conjugate-13 10/03/2014  . Rotavirus Pentavalent 03/22/1998   Pertinent  Health Maintenance Due  Topic Date Due  . FOOT EXAM  01/20/1956  . OPHTHALMOLOGY EXAM  01[birth year]20/1956  . URINE MICROALBUMIN  01/20/1958  . COLONOSCOPY  01/20/1998  . PNA vac Low Risk Adult (  2 of 2 - PPSV23) 10/03/2015  . HEMOGLOBIN A1C  10/16/2018  . INFLUENZA VACCINE  Completed   Fall Risk  04/23/2018  Falls in the past year? No    Vitals:   05/11/18 0936  BP: 138/74  Pulse: (!) 52  Resp: 16  Temp: 98 F (36.7 C)  TempSrc: Oral  SpO2: 96%  Weight: 188 lb (85.3 kg)  Height: 5\' 10"  (1.778 m)   Body mass index is 26.98 kg/m.  Physical Exam  GENERAL APPEARANCE: Well nourished. In no acute distress. Normal body habitus MOUTH and THROAT: Lips are without lesions. Oral mucosa is moist and without lesions. 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 Speech is clear. Disoriented to time and place, alert to self. PSYCHIATRIC:  Affect and behavior are appropriate   Labs reviewed: Recent Labs    12/27/17 0445 12/27/17 0908 12/28/17 0450 12/29/17 0506 12/29/17 0619 05/06/18  NA 149* 146* 139 141  --  146  K 3.0* 3.1* 4.0 3.4*  --  2.9*  CL 115* 112* 104 106  --   --   CO2 27 27 28 27   --   --   GLUCOSE 158* 157* 261* 151*  --   --   BUN 25* 22 15 10   --  12  CREATININE 0.55* 0.55* 0.56* 0.44*  --  0.6  CALCIUM 7.8* 7.6* 7.8* 7.8*  --   --   MG 1.8  --  1.7 2.1  --   --   PHOS 1.4*  --  1.6*  --  3.0  --    Recent Labs    06/11/17 1042 07/02/17 1732 12/26/17 1040  AST 27 24 14*  ALT 20 17 18   ALKPHOS 69 89 65  BILITOT 1.0 0.4 1.4*  PROT 8.7* 7.3 8.4*  ALBUMIN 3.6 3.0* 3.9   Recent Labs    07/02/17 1732  12/26/17 1040 12/27/17 0445 12/28/17 0450 12/29/17 0506 04/16/18  WBC 6.3   < > 10.8* 11.8* 10.5 7.0 7.6  NEUTROABS 3.8  --  8.6*  --   --   --  4  HGB 10.1*   < > 12.2* 10.1* 10.6* 10.3* 11.8*  HCT 30.9*   < > 39.0 32.5* 33.9* 32.6* 35*  MCV 89.8   < > 92.4 92.1 90.9 88.8  --   PLT 262   < > 361 331 309 277 254   < > = values in this interval not displayed.    Lab Results  Component Value Date   HGBA1C 7.9 (A) 04/16/2018    Assessment/Plan  1. C. difficile colitis -  He verbalized having diarrhea this morning, continue vancomycin 125 mg 4 times daily (stop date 05/17/18)   2. Hypokalemia - was recently supplemented wit K, awaiting result of repeat BMP Lab Results  Component Value Date   K 2.9 (A) 05/06/2018    3. Dysphagia, unspecified type - occasional coughing noted during breakfast, continue mechanical chopped CCD with honey thick liquids, aspiration precautions    4. Urinary retention - continue Tamsulosin 0.4 mg 1 capsule Q evening   5. Uncontrolled type 2 diabetes mellitus with hyperglycemia, with long-term current use of insulin (HCC) -  well-controlled, continue Lantus  100 units/ml inject 30 units SQ Q HS and Metformin 1,000 mg BID, Novolo 100 units/ml 10 units SQ with lunch and dinner Lab Results  Component Value Date   HGBA1C 7.9 (A) 04/16/2018    6. Depression, recurrent (HCC) - mood is stable, continue Sertraline 50 mg 1 tab BID   7. Poor appetite -  Body mass index is 26.98 kg/m. continue Eldertonic 15 ml BID Last Weight  Most recent update: 05/11/2018  9:36 AM   Weight  85.3 kg (188 lb)            8. Insomnia, unspecified type  - continue trazodone 50 mg 1 tab nightly and melatonin 10 mg 1 tab nightly  9. Anxiety - mood is stable, continue Ativan 0.5 mg 1 tab BID   Family/ staff Communication: Discussed plan of care with resident.  Labs/tests ordered:  None  Goals of care:   Long-term care.   Kenard Gower, NP [facility] [phone] (Monday-Friday 8:00 a.m. - 5:00 p.m.) [phone] (after hours)

## 2018-05-12 DIAGNOSIS — Z79899 Other long term (current) drug therapy: Secondary | ICD-10-CM | POA: Diagnosis not present

## 2018-05-12 DIAGNOSIS — D649 Anemia, unspecified: Secondary | ICD-10-CM | POA: Diagnosis not present

## 2018-05-12 DIAGNOSIS — E1142 Type 2 diabetes mellitus with diabetic polyneuropathy: Secondary | ICD-10-CM | POA: Diagnosis not present

## 2018-05-12 DIAGNOSIS — A4189 Other specified sepsis: Secondary | ICD-10-CM | POA: Diagnosis not present

## 2018-05-12 LAB — BASIC METABOLIC PANEL
BUN: 10 (ref 4–21)
CREATININE: 0.4 — AB (ref 0.6–1.3)
Glucose: 138
POTASSIUM: 3.2 — AB (ref 3.4–5.3)
Sodium: 147 (ref 137–147)

## 2018-05-20 ENCOUNTER — Other Ambulatory Visit: Payer: Self-pay

## 2018-05-20 DIAGNOSIS — Z79899 Other long term (current) drug therapy: Secondary | ICD-10-CM | POA: Diagnosis not present

## 2018-05-20 DIAGNOSIS — D649 Anemia, unspecified: Secondary | ICD-10-CM | POA: Diagnosis not present

## 2018-05-20 DIAGNOSIS — A4189 Other specified sepsis: Secondary | ICD-10-CM | POA: Diagnosis not present

## 2018-05-20 LAB — BASIC METABOLIC PANEL
BUN: 9 (ref 4–21)
Creatinine: 0.6 (ref 0.6–1.3)
Glucose: 165
POTASSIUM: 4.2 (ref 3.4–5.3)
SODIUM: 153 — AB (ref 137–147)

## 2018-05-20 MED ORDER — LORAZEPAM 1 MG PO TABS: 0.5000 mg | ORAL_TABLET | Freq: Two times a day (BID) | ORAL | 0 refills | Status: DC

## 2018-05-20 NOTE — Telephone Encounter (Signed)
Pharmacy form completed and faxed to [facility].  Gave to AshertonJoann to put in pharmacy tote after faxing.

## 2018-05-26 DIAGNOSIS — Z79899 Other long term (current) drug therapy: Secondary | ICD-10-CM | POA: Diagnosis not present

## 2018-05-26 DIAGNOSIS — D649 Anemia, unspecified: Secondary | ICD-10-CM | POA: Diagnosis not present

## 2018-05-26 LAB — BASIC METABOLIC PANEL
BUN: 12 (ref 4–21)
CREATININE: 0.5 — AB (ref 0.6–1.3)
Glucose: 241
Potassium: 4.1 (ref 3.4–5.3)
Sodium: 144 (ref 137–147)

## 2018-05-27 DIAGNOSIS — Z792 Long term (current) use of antibiotics: Secondary | ICD-10-CM | POA: Diagnosis not present

## 2018-05-27 DIAGNOSIS — B961 Klebsiella pneumoniae [K. pneumoniae] as the cause of diseases classified elsewhere: Secondary | ICD-10-CM | POA: Diagnosis not present

## 2018-05-27 DIAGNOSIS — A0472 Enterocolitis due to Clostridium difficile, not specified as recurrent: Secondary | ICD-10-CM | POA: Diagnosis not present

## 2018-05-27 DIAGNOSIS — A419 Sepsis, unspecified organism: Secondary | ICD-10-CM | POA: Diagnosis not present

## 2018-06-01 ENCOUNTER — Other Ambulatory Visit: Payer: Self-pay

## 2018-06-01 MED ORDER — LORAZEPAM 1 MG PO TABS: 0.5000 mg | ORAL_TABLET | Freq: Two times a day (BID) | ORAL | 0 refills | Status: DC

## 2018-06-01 NOTE — Telephone Encounter (Signed)
Hard script written by Monina Medina-Vargas, NP and given to nurse. 

## 2018-06-04 ENCOUNTER — Non-Acute Institutional Stay (SKILLED_NURSING_FACILITY): Payer: Medicare Other | Admitting: Adult Health

## 2018-06-04 ENCOUNTER — Encounter: Payer: Self-pay | Admitting: Adult Health

## 2018-06-04 DIAGNOSIS — Z794 Long term (current) use of insulin: Secondary | ICD-10-CM

## 2018-06-04 DIAGNOSIS — E876 Hypokalemia: Secondary | ICD-10-CM | POA: Diagnosis not present

## 2018-06-04 DIAGNOSIS — F5102 Adjustment insomnia: Secondary | ICD-10-CM | POA: Diagnosis not present

## 2018-06-04 DIAGNOSIS — R630 Anorexia: Secondary | ICD-10-CM | POA: Diagnosis not present

## 2018-06-04 DIAGNOSIS — R339 Retention of urine, unspecified: Secondary | ICD-10-CM | POA: Diagnosis not present

## 2018-06-04 DIAGNOSIS — F339 Major depressive disorder, recurrent, unspecified: Secondary | ICD-10-CM | POA: Diagnosis not present

## 2018-06-04 DIAGNOSIS — E1169 Type 2 diabetes mellitus with other specified complication: Secondary | ICD-10-CM

## 2018-06-04 DIAGNOSIS — F419 Anxiety disorder, unspecified: Secondary | ICD-10-CM | POA: Diagnosis not present

## 2018-06-04 NOTE — Progress Notes (Signed)
Location:  [facility] Room Number: 211-A Place of Service:  SNF (31) Provider:  Kenard Gower, NP  Patient Care Team: Pecola Lawless, MD as PCP - General (Internal Medicine) Gillis Santa, NP as Nurse Practitioner (Internal Medicine)  Extended Emergency Contact Information Primary Emergency Contact: Arita [place] Home Phone: [phone] Mobile Phone: [phone] Relation: Spouse Secondary Emergency Contact: Lanae Crumbly Mobile Phone: [phone] Relation: Daughter  Code Status:  DNR  Goals of care: Advanced Directive information Advanced Directives 06/04/2018  Does Patient Have a Medical Advance Directive? Yes  Type of Advance Directive Out of facility DNR (pink MOST or yellow form)  Does patient want to make changes to medical advance directive? No - Patient declined  Copy of Healthcare Power of Attorney in Chart? No - copy requested  Would patient like information on creating a medical advance directive? -  Pre-existing out of facility DNR order (yellow form or pink MOST form) -     Chief Complaint  Patient presents with  . Medical Management of Chronic Issues    Routine [facility] visit    HPI:  Pt is a 72 y.o. male seen today for medical management of chronic diseases, as well as a care plan meeting.  He is a long-term care resident of [facility].  He has a PMH of CAD, stroke, PAD, MI, and alcohol abuse. He was seen in the room today. He` is smiling and verbalized "feeling better." No reported diarrhea. He is currently having restorative therapy 2X/week. AROM BLE exercises with 3 lbs weight for knee extension and hip flexion. He participates in group exercises. He is supposed to have care plan meeting today but hope did not show up so care plan meeting will be re-scheduled.   Past Medical History:  Diagnosis Date  . Anemia   . Arthritis   . Dementia (HCC)   .  Diabetes mellitus without complication (HCC)   . ETOH abuse   . Myocardial infarction (HCC)   . Peripheral arterial disease (HCC)   . Stroke (HCC)   . Wears dentures   . Wears glasses    Past Surgical History:  Procedure Laterality Date  . ABDOMINAL AORTOGRAM N/A 10/25/2016   Procedure: Abdominal Aortogram;  Surgeon: Sherren Kerns, MD;  Location: [facility] INVASIVE CV LAB;  Service: Cardiovascular;  Laterality: N/A;  . BRAIN SURGERY    . COLONOSCOPY  2015   Negative, performed by GI in [place]  . CORONARY STENT PLACEMENT     " about 25 years ago (10/29/16)  . ENDARTERECTOMY FEMORAL Right 10/30/2016   Procedure: Eduardo Osier WITH PATCH GRAFT;  Surgeon: Sherren Kerns, MD;  Location: [facility] OR;  Service: Vascular;  Laterality: Right;  . IR NEPHRO TUBE REMOV/FL  07/04/2017  . IR NEPHRO TUBE REMOV/FL  07/07/2017  . IR NEPHROSTOMY EXCHANGE LEFT  06/20/2017  . IR NEPHROSTOMY EXCHANGE RIGHT  06/20/2017  . IR NEPHROSTOMY PLACEMENT LEFT  06/12/2017  . IR NEPHROSTOMY PLACEMENT RIGHT  06/12/2017  . IR URETERAL STENT PLACEMENT EXISTING ACCESS LEFT  06/20/2017  . IR URETERAL STENT PLACEMENT EXISTING ACCESS RIGHT  06/20/2017  . LOWER EXTREMITY ANGIOGRAPHY Right 10/25/2016   Procedure: Lower Extremity Angiography;  Surgeon: Sherren Kerns, MD;  Location: [facility] INVASIVE CV LAB;  Service: Cardiovascular;  Laterality: Right;    Allergies  Allergen Reactions  . Penicillins Rash    Has patient had a PCN reaction causing immediate rash, facial/tongue/throat swelling, SOB or lightheadedness  with hypotension: Yes Has patient had a PCN reaction causing severe rash involving mucus membranes or skin necrosis: No Has patient had a PCN reaction that required hospitalization No Has patient had a PCN reaction occurring within the last 10 years: No If all of the above answers are "NO", then may proceed with Cephalosporin use.     Outpatient Encounter Medications as of 06/04/2018  Medication Sig    . acetaminophen (TYLENOL) 650 MG CR tablet Take 650 mg by mouth 3 (three) times daily.   Marland Kitchen alendronate (FOSAMAX) 70 MG tablet Take 70 mg by mouth once a week. Take with a full glass of water on an empty stomach.  . ASPERCREME LIDOCAINE EX Apply 1 application topically 3 (three) times daily as needed. Apply to elbow and wrist for pain  . aspirin EC 81 MG tablet Take 81 mg by mouth every morning.   Marland Kitchen atorvastatin (LIPITOR) 10 MG tablet Take 10 mg by mouth at bedtime.   . Cholecalciferol (QC VITAMIN D3) 2000 units TABS Take 1 tablet by mouth daily.  Marland Kitchen geriatric multivitamins-minerals (ELDERTONIC/GEVRABON) LIQD Take 15 mLs by mouth 2 (two) times daily.  . insulin aspart (NOVOLOG) 100 UNIT/ML injection Inject 10 Units into the skin 2 (two) times daily. Before lunch and dinner.   . insulin glargine (LANTUS) 100 UNIT/ML injection Inject 30 Units into the skin at bedtime.   Marland Kitchen LORazepam (ATIVAN) 1 MG tablet Take 0.5 tablets (0.5 mg total) by mouth 2 (two) times daily.  . Melatonin 10 MG TABS Take 10 mg by mouth at bedtime.  . metFORMIN (GLUCOPHAGE) 1000 MG tablet Take 1,000 mg by mouth 2 (two) times daily with a meal.  . Nutritional Supplements (NUTRITIONAL SUPPLEMENT PO) Take 1 each by mouth 2 (two) times daily. Magic Cup  . potassium chloride SA (K-DUR,KLOR-CON) 20 MEQ tablet Take 20 mEq by mouth daily.  . sertraline (ZOLOFT) 50 MG tablet Take 50 mg by mouth 2 (two) times daily.   . Skin Protectants, Misc. (EUCERIN) cream Apply topically as needed for dry skin.  . tamsulosin (FLOMAX) 0.4 MG CAPS capsule Take 0.8 mg by mouth every evening. Take 2 capsules to = 0.8 mg  . traZODone (DESYREL) 50 MG tablet TAKE ONE TABLET (50 MG TOTAL) BY MOUTH AT BEDTIME.  Marland Kitchen trolamine salicylate (ASPERCREME) 10 % cream Apply 1 application topically 2 (two) times daily. Apply to lower back   No facility-administered encounter medications on file as of 06/04/2018.     Review of Systems  GENERAL: No change in appetite,  no fatigue, no fever, chills or weakness MOUTH and THROAT: Denies oral discomfort, gingival pain or bleeding, pain from teeth or hoarseness   RESPIRATORY: no cough, SOB, DOE, wheezing, hemoptysis CARDIAC: No chest pain, edema or palpitations GI: No abdominal pain, diarrhea, constipation, heart burn, nausea or vomiting GU: Denies dysuria, frequency, hematuria, incontinence, or discharge PSYCHIATRIC: Denies feelings of depression or anxiety. No report of hallucinations, insomnia, paranoia, or agitation    Immunization History  Administered Date(s) Administered  . Influenza, High Dose Seasonal PF 03/21/2015, 08/19/2016  . Influenza-Unspecified 04/24/2018  . Pneumococcal Conjugate-13 10/03/2014  . Rotavirus Pentavalent 03/22/1998   Pertinent  Health Maintenance Due  Topic Date Due  . FOOT EXAM  01/20/1956  . OPHTHALMOLOGY EXAM  01[birth year]20/1958  . URINE MICROALBUMIN  01[birth year]20/1958  . COLONOSCOPY  01/20/1998  . PNA vac Low Risk Adult (2 of 2 - PPSV23) 10/03/2015  . HEMOGLOBIN A1C  10/16/2018  . INFLUENZA VACCINE  Completed  Fall Risk  04/23/2018  Falls in the past year? No     Vitals:   06/04/18 1006  BP: 103/70  Pulse: 81  Resp: 20  Temp: (!) 96.7 F (35.9 C)  TempSrc: Oral  Weight: 179 lb 12.8 oz (81.6 kg)  Height: 5\' 10"  (1.778 m)   Body mass index is 25.8 kg/m.  Physical Exam  GENERAL APPEARANCE: Well nourished. In no acute distress. Normal body habitus SKIN:  Skin is warm and dry.  RESPIRATORY: Breathing is even & unlabored, BS CTAB CARDIAC: RRR, no murmur,no extra heart sounds, no edema GI: Abdomen soft, normal BS, no masses EXTREMITIES:  Able to move X 4 extremities, left BKA, LUE with limited ROM NEUROLOGICAL: There is no tremor. Speech is clear. Alert to self, disoriented to time and place PSYCHIATRIC:  Affect and behavior are appropriate   Labs reviewed: Recent Labs    12/27/17 0445 12/27/17 0908 12/28/17 0450 12/29/17 0506 12/29/17 0619  05/12/18  05/20/18 05/26/18  NA 149* 146* 139 141  --    < > 147 153* 144  K 3.0* 3.1* 4.0 3.4*  --    < > 3.2* 4.2 4.1  CL 115* 112* 104 106  --   --   --   --   --   CO2 27 27 28 27   --   --   --   --   --   GLUCOSE 158* 157* 261* 151*  --   --   --   --   --   BUN 25* 22 15 10   --    < > 10 9 12   CREATININE 0.55* 0.55* 0.56* 0.44*  --    < > 0.4* 0.6 0.5*  CALCIUM 7.8* 7.6* 7.8* 7.8*  --   --   --   --   --   MG 1.8  --  1.7 2.1  --   --   --   --   --   PHOS 1.4*  --  1.6*  --  3.0  --   --   --   --    < > = values in this interval not displayed.   Recent Labs    06/11/17 1042 07/02/17 1732 12/26/17 1040  AST 27 24 14*  ALT 20 17 18   ALKPHOS 69 89 65  BILITOT 1.0 0.4 1.4*  PROT 8.7* 7.3 8.4*  ALBUMIN 3.6 3.0* 3.9   Recent Labs    07/02/17 1732  12/26/17 1040 12/27/17 0445 12/28/17 0450 12/29/17 0506 04/16/18  WBC 6.3   < > 10.8* 11.8* 10.5 7.0 7.6  NEUTROABS 3.8  --  8.6*  --   --   --  4  HGB 10.1*   < > 12.2* 10.1* 10.6* 10.3* 11.8*  HCT 30.9*   < > 39.0 32.5* 33.9* 32.6* 35*  MCV 89.8   < > 92.4 92.1 90.9 88.8  --   PLT 262   < > 361 331 309 277 254   < > = values in this interval not displayed.    Lab Results  Component Value Date   HGBA1C 7.9 (A) 04/16/2018    Assessment/Plan  1. Type 2 diabetes mellitus with other specified complication, with long-term current use of insulin (HCC) - well-controlled, continue Lantus 100 units/ml inject 30 units SQ Q HS and Metformin 1,000 mg BID Lab Results  Component Value Date   HGBA1C 7.9 (A) 04/16/2018    2. Urinary retention - will continue  Tamsulosin 0.4 mg 1 capsule Q evening   3. Depression, recurrent (HCC) - denies depressed feelings, continue Sertraline 50 mg 1 tab BID   4. Hypokalemia -  Continue KCL ER 20 meq 1 tab daily Lab Results  Component Value Date   K 4.1 05/26/2018    5. Poor appetite - Body mass index is 25.8 kg/m.  he had 8.2 lbs weight loss in a month, will continue Eldertonic 15 ml BID, Magic  cup BID Last Weight  Most recent update: 06/04/2018 10:07 AM   Weight  81.6 kg (179 lb 12.8 oz)            6. Anxiety -  mood is stable, continues to need Ativan 0.5 mg 1 tab BID   7. Adjustment insomnia - continue Trazodone 50 mg 1 tab Q HS and Melatonin 10 mg 1 tab Q HS    Family/ staff Communication: Discussed plan of care with resident.  Labs/tests ordered:  None  Goals of care:   Long-term care.   Kenard Gower, NP [facility] [phone] (Monday-Friday 8:00 a.m. - 5:00 p.m.) [phone] (after hours)

## 2018-06-05 DIAGNOSIS — M6281 Muscle weakness (generalized): Secondary | ICD-10-CM | POA: Diagnosis not present

## 2018-06-05 DIAGNOSIS — R2689 Other abnormalities of gait and mobility: Secondary | ICD-10-CM | POA: Diagnosis not present

## 2018-06-08 DIAGNOSIS — M6281 Muscle weakness (generalized): Secondary | ICD-10-CM | POA: Diagnosis not present

## 2018-06-08 DIAGNOSIS — R2689 Other abnormalities of gait and mobility: Secondary | ICD-10-CM | POA: Diagnosis not present

## 2018-06-09 DIAGNOSIS — R2689 Other abnormalities of gait and mobility: Secondary | ICD-10-CM | POA: Diagnosis not present

## 2018-06-09 DIAGNOSIS — M6281 Muscle weakness (generalized): Secondary | ICD-10-CM | POA: Diagnosis not present

## 2018-06-10 DIAGNOSIS — R2689 Other abnormalities of gait and mobility: Secondary | ICD-10-CM | POA: Diagnosis not present

## 2018-06-10 DIAGNOSIS — M6281 Muscle weakness (generalized): Secondary | ICD-10-CM | POA: Diagnosis not present

## 2018-06-11 DIAGNOSIS — M6281 Muscle weakness (generalized): Secondary | ICD-10-CM | POA: Diagnosis not present

## 2018-06-11 DIAGNOSIS — R2689 Other abnormalities of gait and mobility: Secondary | ICD-10-CM | POA: Diagnosis not present

## 2018-06-12 DIAGNOSIS — R2689 Other abnormalities of gait and mobility: Secondary | ICD-10-CM | POA: Diagnosis not present

## 2018-06-12 DIAGNOSIS — M6281 Muscle weakness (generalized): Secondary | ICD-10-CM | POA: Diagnosis not present

## 2018-06-15 DIAGNOSIS — R2689 Other abnormalities of gait and mobility: Secondary | ICD-10-CM | POA: Diagnosis not present

## 2018-06-15 DIAGNOSIS — M6281 Muscle weakness (generalized): Secondary | ICD-10-CM | POA: Diagnosis not present

## 2018-06-16 DIAGNOSIS — M6281 Muscle weakness (generalized): Secondary | ICD-10-CM | POA: Diagnosis not present

## 2018-06-16 DIAGNOSIS — R2689 Other abnormalities of gait and mobility: Secondary | ICD-10-CM | POA: Diagnosis not present

## 2018-06-18 DIAGNOSIS — M6281 Muscle weakness (generalized): Secondary | ICD-10-CM | POA: Diagnosis not present

## 2018-06-18 DIAGNOSIS — R2689 Other abnormalities of gait and mobility: Secondary | ICD-10-CM | POA: Diagnosis not present

## 2018-06-19 DIAGNOSIS — M6281 Muscle weakness (generalized): Secondary | ICD-10-CM | POA: Diagnosis not present

## 2018-06-19 DIAGNOSIS — R2689 Other abnormalities of gait and mobility: Secondary | ICD-10-CM | POA: Diagnosis not present

## 2018-06-22 DIAGNOSIS — R2689 Other abnormalities of gait and mobility: Secondary | ICD-10-CM | POA: Diagnosis not present

## 2018-06-22 DIAGNOSIS — M6281 Muscle weakness (generalized): Secondary | ICD-10-CM | POA: Diagnosis not present

## 2018-06-23 DIAGNOSIS — R2689 Other abnormalities of gait and mobility: Secondary | ICD-10-CM | POA: Diagnosis not present

## 2018-06-23 DIAGNOSIS — M6281 Muscle weakness (generalized): Secondary | ICD-10-CM | POA: Diagnosis not present

## 2018-06-24 DIAGNOSIS — M6281 Muscle weakness (generalized): Secondary | ICD-10-CM | POA: Diagnosis not present

## 2018-06-24 DIAGNOSIS — R2689 Other abnormalities of gait and mobility: Secondary | ICD-10-CM | POA: Diagnosis not present

## 2018-06-24 DIAGNOSIS — R2681 Unsteadiness on feet: Secondary | ICD-10-CM | POA: Diagnosis not present

## 2018-06-25 DIAGNOSIS — R2681 Unsteadiness on feet: Secondary | ICD-10-CM | POA: Diagnosis not present

## 2018-06-25 DIAGNOSIS — M6281 Muscle weakness (generalized): Secondary | ICD-10-CM | POA: Diagnosis not present

## 2018-06-25 DIAGNOSIS — R2689 Other abnormalities of gait and mobility: Secondary | ICD-10-CM | POA: Diagnosis not present

## 2018-06-26 DIAGNOSIS — M6281 Muscle weakness (generalized): Secondary | ICD-10-CM | POA: Diagnosis not present

## 2018-06-26 DIAGNOSIS — R2681 Unsteadiness on feet: Secondary | ICD-10-CM | POA: Diagnosis not present

## 2018-06-26 DIAGNOSIS — R2689 Other abnormalities of gait and mobility: Secondary | ICD-10-CM | POA: Diagnosis not present

## 2018-06-29 ENCOUNTER — Non-Acute Institutional Stay (SKILLED_NURSING_FACILITY): Payer: Medicare Other | Admitting: Adult Health

## 2018-06-29 ENCOUNTER — Encounter: Payer: Self-pay | Admitting: Adult Health

## 2018-06-29 DIAGNOSIS — F339 Major depressive disorder, recurrent, unspecified: Secondary | ICD-10-CM | POA: Diagnosis not present

## 2018-06-29 DIAGNOSIS — M6281 Muscle weakness (generalized): Secondary | ICD-10-CM | POA: Diagnosis not present

## 2018-06-29 DIAGNOSIS — E1169 Type 2 diabetes mellitus with other specified complication: Secondary | ICD-10-CM

## 2018-06-29 DIAGNOSIS — R2689 Other abnormalities of gait and mobility: Secondary | ICD-10-CM | POA: Diagnosis not present

## 2018-06-29 DIAGNOSIS — Z794 Long term (current) use of insulin: Secondary | ICD-10-CM

## 2018-06-29 DIAGNOSIS — R630 Anorexia: Secondary | ICD-10-CM | POA: Diagnosis not present

## 2018-06-29 DIAGNOSIS — R1319 Other dysphagia: Secondary | ICD-10-CM

## 2018-06-29 DIAGNOSIS — R339 Retention of urine, unspecified: Secondary | ICD-10-CM

## 2018-06-29 DIAGNOSIS — R2681 Unsteadiness on feet: Secondary | ICD-10-CM | POA: Diagnosis not present

## 2018-06-29 NOTE — Progress Notes (Signed)
Location:  [facility] Room Number: 211-A Place of Service:  SNF (31) Provider:  Kenard GowerMedina-Vargas, Monina, NP  Patient Care Team: Pecola LawlessHopper, William F, MD as PCP - General (Internal Medicine) Gillis SantaMedina-Vargas, Monina C, NP as Nurse Practitioner (Internal Medicine)  Extended Emergency Contact Information Primary Emergency Contact: Arita MissBennett,Carrie  [place] Home Phone: [phone] Mobile Phone: [phone] Relation: Spouse Secondary Emergency Contact: Lanae CrumblyStafford, Sheila Mobile Phone: [phone] Relation: Daughter  Code Status:  DNR  Goals of care: Advanced Directive information Advanced Directives 06/04/2018  Does Patient Have a Medical Advance Directive? Yes  Type of Advance Directive Out of facility DNR (pink MOST or yellow form)  Does patient want to make changes to medical advance directive? No - Patient declined  Copy of Healthcare Power of Attorney in Chart? No - copy requested  Would patient like information on creating a medical advance directive? -  Pre-existing out of facility DNR order (yellow form or pink MOST form) -     Chief Complaint  Patient presents with  . Medical Management of Chronic Issues    Routine [facility] visit    HPI:  Pt is a 73 y.o. male seen today for medical management of chronic diseases.  He is a long-term care resident of [facility].  He has a PMH of CAD, stroke, PAD, MI, and alcohol abuse. He was seen in his room today. CBGs were noted to be stable -172, 227, 181, 212, 258, 98.    Past Medical History:  Diagnosis Date  . Anemia   . Arthritis   . Dementia (HCC)   . Diabetes mellitus without complication (HCC)   . ETOH abuse   . Myocardial infarction (HCC)   . Peripheral arterial disease (HCC)   . Stroke (HCC)   . Wears dentures   . Wears glasses    Past Surgical History:  Procedure Laterality Date  . ABDOMINAL AORTOGRAM N/A 10/25/2016   Procedure: Abdominal Aortogram;   Surgeon: Sherren KernsFields, Charles E, MD;  [facility];  Service: Cardiovascular;  Laterality: N/A;  . BRAIN SURGERY    . COLONOSCOPY  2015   [place]  . CORONARY STENT PLACEMENT     " about 25 years ago (10/29/16)  . ENDARTERECTOMY FEMORAL Right 10/30/2016   Procedure: Eduardo OsierENDARTERECTOMY FEMORAL-RIGHT WITH PATCH GRAFT;  Surgeon: Sherren KernsFields, Charles E, MD;  [facility];  Service: Vascular;  Laterality: Right;  . IR NEPHRO TUBE REMOV/FL  07/04/2017  . IR NEPHRO TUBE REMOV/FL  07/07/2017  . IR NEPHROSTOMY EXCHANGE LEFT  06/20/2017  . IR NEPHROSTOMY EXCHANGE RIGHT  06/20/2017  . IR NEPHROSTOMY PLACEMENT LEFT  06/12/2017  . IR NEPHROSTOMY PLACEMENT RIGHT  06/12/2017  . IR URETERAL STENT PLACEMENT EXISTING ACCESS LEFT  06/20/2017  . IR URETERAL STENT PLACEMENT EXISTING ACCESS RIGHT  06/20/2017  . LOWER EXTREMITY ANGIOGRAPHY Right 10/25/2016   Procedure: Lower Extremity Angiography;  Surgeon: Sherren KernsFields, Charles E, MD;  [facility];  Service: Cardiovascular;  Laterality: Right;    Allergies  Allergen Reactions  . Penicillins Rash    Has patient had a PCN reaction causing immediate rash, facial/tongue/throat swelling, SOB or lightheadedness with hypotension: Yes Has patient had a PCN reaction causing severe rash involving mucus membranes or skin necrosis: No Has patient had a PCN reaction that required hospitalization No Has patient had a PCN reaction occurring within the last 10 years: No If all of the above answers are "NO", then  may proceed with Cephalosporin use.     Outpatient Encounter Medications as of 06/29/2018  Medication Sig  . acetaminophen (TYLENOL) 650 MG CR tablet Take 650 mg by mouth 3 (three) times daily.   Marland Kitchen alendronate (FOSAMAX) 70 MG tablet Take 70 mg by mouth once a week. Take with a full glass of water on an empty stomach.  . ASPERCREME LIDOCAINE EX Apply 1 application topically 3 (three) times daily as needed. Apply to elbow  and wrist for pain  . aspirin EC 81 MG tablet Take 81 mg by mouth every morning.   Marland Kitchen atorvastatin (LIPITOR) 10 MG tablet Take 10 mg by mouth at bedtime.   . Cholecalciferol (QC VITAMIN D3) 2000 units TABS Take 1 tablet by mouth daily.  Marland Kitchen geriatric multivitamins-minerals (ELDERTONIC/GEVRABON) LIQD Take 15 mLs by mouth 2 (two) times daily.  . insulin aspart (NOVOLOG) 100 UNIT/ML injection Inject 10 Units into the skin 2 (two) times daily. Before lunch and dinner.   . insulin glargine (LANTUS) 100 UNIT/ML injection Inject 30 Units into the skin at bedtime.   Marland Kitchen LORazepam (ATIVAN) 1 MG tablet Take 0.5 tablets (0.5 mg total) by mouth 2 (two) times daily.  . Melatonin 10 MG TABS Take 10 mg by mouth at bedtime.  . metFORMIN (GLUCOPHAGE) 1000 MG tablet Take 1,000 mg by mouth 2 (two) times daily with a meal.  . Nutritional Supplements (NUTRITIONAL SUPPLEMENT PO) Take 1 each by mouth 2 (two) times daily. Magic Cup  . potassium chloride SA (K-DUR,KLOR-CON) 20 MEQ tablet Take 20 mEq by mouth daily.  . sertraline (ZOLOFT) 50 MG tablet Take 50 mg by mouth 2 (two) times daily.   . Skin Protectants, Misc. (EUCERIN) cream Apply topically as needed for dry skin.  . tamsulosin (FLOMAX) 0.4 MG CAPS capsule Take 0.8 mg by mouth every evening. Take 2 capsules to = 0.8 mg  . traZODone (DESYREL) 50 MG tablet TAKE ONE TABLET (50 MG TOTAL) BY MOUTH AT BEDTIME.  Marland Kitchen trolamine salicylate (ASPERCREME) 10 % cream Apply 1 application topically 2 (two) times daily. Apply to lower back.   No facility-administered encounter medications on file as of 06/29/2018.     Review of Systems  GENERAL: No fatigue, no fever, chills or weakness MOUTH and THROAT: Denies oral discomfort, gingival pain or bleeding RESPIRATORY: no cough, SOB, DOE, wheezing, hemoptysis CARDIAC: No chest pain, edema or palpitations GI: No abdominal pain, diarrhea, constipation, heart burn, nausea or vomiting GU: Denies dysuria, frequency, hematuria, or  discharge PSYCHIATRIC: Denies feelings of depression or anxiety. No report of hallucinations, insomnia, paranoia, or agitation   Immunization History  Administered Date(s) Administered  . Influenza, High Dose Seasonal PF 03/21/2015, 08/19/2016  . Influenza-Unspecified 04/24/2018  . Pneumococcal Conjugate-13 10/03/2014  . Rotavirus Pentavalent 03/22/1998   Pertinent  Health Maintenance Due  Topic Date Due  . FOOT EXAM  01/20/1956  . OPHTHALMOLOGY EXAM  [birth year]20/1956  . URINE MICROALBUMIN  01[birth year]20/1958  . COLONOSCOPY  01/20/1998  . PNA vac Low Risk Adult (2 of 2 - PPSV23) 10/03/2015  . HEMOGLOBIN A1C  10/16/2018  . INFLUENZA VACCINE  Completed   Fall Risk  04/23/2018  Falls in the past year? No      Vitals:   06/29/18 0900  BP: 119/68  Pulse: 81  Resp: 20  Temp: 98.4 F (36.9 C)  TempSrc: Oral  SpO2: 96%  Weight: 179 lb 12.8 oz (81.6 kg)  Height: 6\' 1"  (1.854 m)   Body mass index is 23.72 kg/m.  Physical Exam  GENERAL APPEARANCE: Well nourished. In no acute distress. Normal body habitus SKIN:  Skin is warm and dry. MOUTH and THROAT: Lips are without lesions. Oral mucosa is moist and without lesions.  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 Speech is clear. Alert to self, disoriented to time and place PSYCHIATRIC:  Affect and behavior are appropriate   Labs reviewed: Recent Labs    12/27/17 0445 12/27/17 0908 12/28/17 0450 12/29/17 0506 12/29/17 0619  05/12/18 05/20/18 05/26/18  NA 149* 146* 139 141  --    < > 147 153* 144  K 3.0* 3.1* 4.0 3.4*  --    < > 3.2* 4.2 4.1  CL 115* 112* 104 106  --   --   --   --   --   CO2 27 27 28 27   --   --   --   --   --   GLUCOSE 158* 157* 261* 151*  --   --   --   --   --   BUN 25* 22 15 10   --    < > 10 9 12     CREATININE 0.55* 0.55* 0.56* 0.44*  --    < > 0.4* 0.6 0.5*  CALCIUM 7.8* 7.6* 7.8* 7.8*  --   --   --   --   --   MG 1.8  --  1.7 2.1  --   --   --   --   --   PHOS 1.4*  --  1.6*  --  3.0  --   --   --   --    < > = values in this interval not displayed.   Recent Labs    07/02/17 1732 12/26/17 1040  AST 24 14*  ALT 17 18  ALKPHOS 89 65  BILITOT 0.4 1.4*  PROT 7.3 8.4*  ALBUMIN 3.0* 3.9   Recent Labs    07/02/17 1732  12/26/17 1040 12/27/17 0445 12/28/17 0450 12/29/17 0506 04/16/18  WBC 6.3   < > 10.8* 11.8* 10.5 7.0 7.6  NEUTROABS 3.8  --  8.6*  --   --   --  4  HGB 10.1*   < > 12.2* 10.1* 10.6* 10.3* 11.8*  HCT 30.9*   < > 39.0 32.5* 33.9* 32.6* 35*  MCV 89.8   < > 92.4 92.1 90.9 88.8  --   PLT 262   < > 361 331 309 277 254   < > = values in this interval not displayed.    Lab Results  Component Value Date   HGBA1C 7.9 (A) 04/16/2018    Assessment/Plan  1. Type 2 diabetes mellitus with other specified complication, with long-term current use of insulin (HCC) -stable, continue Lantus 100 unit/mL inject 30 units subcutaneously nightly, metformin 1000 mg 1 tab twice a day, NovoLog 100 units/mL inject 10 units subcutaneously pre-lunch and dinner Lab Results  Component Value Date   HGBA1C 7.9 (A) 04/16/2018    2. Other dysphagia -continue mechanical chopped CCD with honey thick liquids precautions     3. Urinary retention - denies retention, continue tamsulosin 0.4 mg 1 capsule q. evening   4. Poor appetite - Body mass index is 23.72 kg/m.  Continue Eldertonic 15 mL twice daily and Magic cup twice  a day    5. Depression, recurrent (HCC) -mood is stable, continue sertraline 50 mg 1 tab twice a day    Family/ staff Communication: Discussed plan of care with resident.  Labs/tests ordered: None  Goals of care:   Long-term care.   Kenard GowerMonina Medina-Vargas, NP [facility] [phone] (Monday-Friday 8:00 a.m. - 5:00  p.m.) [phone] (after hours)

## 2018-06-30 DIAGNOSIS — M6281 Muscle weakness (generalized): Secondary | ICD-10-CM | POA: Diagnosis not present

## 2018-06-30 DIAGNOSIS — R2681 Unsteadiness on feet: Secondary | ICD-10-CM | POA: Diagnosis not present

## 2018-06-30 DIAGNOSIS — R2689 Other abnormalities of gait and mobility: Secondary | ICD-10-CM | POA: Diagnosis not present

## 2018-06-30 MED ORDER — LORAZEPAM 1 MG PO TABS: 0.5000 mg | ORAL_TABLET | Freq: Two times a day (BID) | ORAL | 0 refills | Status: DC

## 2018-07-01 ENCOUNTER — Non-Acute Institutional Stay (SKILLED_NURSING_FACILITY): Payer: Medicare Other | Admitting: Adult Health

## 2018-07-01 ENCOUNTER — Encounter: Payer: Self-pay | Admitting: Adult Health

## 2018-07-01 DIAGNOSIS — M6281 Muscle weakness (generalized): Secondary | ICD-10-CM | POA: Diagnosis not present

## 2018-07-01 DIAGNOSIS — Z7189 Other specified counseling: Secondary | ICD-10-CM

## 2018-07-01 DIAGNOSIS — R197 Diarrhea, unspecified: Secondary | ICD-10-CM | POA: Diagnosis not present

## 2018-07-01 DIAGNOSIS — F419 Anxiety disorder, unspecified: Secondary | ICD-10-CM

## 2018-07-01 DIAGNOSIS — G8929 Other chronic pain: Secondary | ICD-10-CM

## 2018-07-01 DIAGNOSIS — Z794 Long term (current) use of insulin: Secondary | ICD-10-CM

## 2018-07-01 DIAGNOSIS — E876 Hypokalemia: Secondary | ICD-10-CM

## 2018-07-01 DIAGNOSIS — R2681 Unsteadiness on feet: Secondary | ICD-10-CM | POA: Diagnosis not present

## 2018-07-01 DIAGNOSIS — R339 Retention of urine, unspecified: Secondary | ICD-10-CM | POA: Diagnosis not present

## 2018-07-01 DIAGNOSIS — G4709 Other insomnia: Secondary | ICD-10-CM | POA: Diagnosis not present

## 2018-07-01 DIAGNOSIS — E1169 Type 2 diabetes mellitus with other specified complication: Secondary | ICD-10-CM | POA: Diagnosis not present

## 2018-07-01 DIAGNOSIS — M545 Low back pain, unspecified: Secondary | ICD-10-CM

## 2018-07-01 DIAGNOSIS — R2689 Other abnormalities of gait and mobility: Secondary | ICD-10-CM | POA: Diagnosis not present

## 2018-07-01 DIAGNOSIS — F339 Major depressive disorder, recurrent, unspecified: Secondary | ICD-10-CM | POA: Diagnosis not present

## 2018-07-01 NOTE — Progress Notes (Addendum)
Location:  [facility] Room Number: 211-A Place of Service:  SNF (31) Provider:  Kenard Gower, NP  Patient Care Team: Pecola Lawless, MD as PCP - General (Internal Medicine) Gillis Santa, NP as Nurse Practitioner (Internal Medicine)  Extended Emergency Contact Information Primary Emergency Contact: Arita [place] Home Phone: [phone] Mobile Phone: [phone] Relation: Spouse Secondary Emergency Contact: Lanae Crumbly Mobile Phone: [phone] Relation: Daughter  Code Status:  DNR  Goals of care: Advanced Directive information Advanced Directives 06/29/2018  Does Patient Have a Medical Advance Directive? -  Type of Advance Directive Out of facility DNR (pink MOST or yellow form)  Does patient want to make changes to medical advance directive? No - Patient declined  Copy of Healthcare Power of Attorney in Chart? No - copy requested  Would patient like information on creating a medical advance directive? -  Pre-existing out of facility DNR order (yellow form or pink MOST form) -     Chief Complaint  Patient presents with  . Advanced Directive    Care Plan Meeting    HPI:  Pt is a 73 y.o. male seen today for advance care planning.  He is a long-term care resident of [facility].  He has a PMH of CAD, stroke, PAD, MI, and alcohol abuse. Care plan meeting was attended by NP, resident, wife, social worker, MDS coordinator and PT. Wife mentioned that he has been having diarrhea again. He was recently treated for C-Dificille colitis. Wife will bring his hearing aids from home but requested for it to be put on in am and off at HS. PT said that he is moderate to maximum assistance in transfer. The goal is for him to be better with transfer. He is now a long-term care resident. BIMS score was 5/15, severe cognitive deficit. The care plan meeting lasted for 30 minutes   Past Medical  History:  Diagnosis Date  . Anemia   . Arthritis   . Dementia (HCC)   . Diabetes mellitus without complication (HCC)   . ETOH abuse   . Myocardial infarction (HCC)   . Peripheral arterial disease (HCC)   . Stroke (HCC)   . Wears dentures   . Wears glasses    Past Surgical History:  Procedure Laterality Date  . ABDOMINAL AORTOGRAM N/A 10/25/2016   Procedure: Abdominal Aortogram;  Surgeon: Sherren Kerns, MD;  Location: [facility] INVASIVE CV LAB;  Service: Cardiovascular;  Laterality: N/A;  . BRAIN SURGERY    . COLONOSCOPY  2015   Negative, performed by GI in [place]  . CORONARY STENT PLACEMENT     " about 25 years ago (10/29/16)  . ENDARTERECTOMY FEMORAL Right 10/30/2016   Procedure: Eduardo Osier WITH PATCH GRAFT;  Surgeon: Sherren Kerns, MD;  Location: [facility] OR;  Service: Vascular;  Laterality: Right;  . IR NEPHRO TUBE REMOV/FL  07/04/2017  . IR NEPHRO TUBE REMOV/FL  07/07/2017  . IR NEPHROSTOMY EXCHANGE LEFT  06/20/2017  . IR NEPHROSTOMY EXCHANGE RIGHT  06/20/2017  . IR NEPHROSTOMY PLACEMENT LEFT  06/12/2017  . IR NEPHROSTOMY PLACEMENT RIGHT  06/12/2017  . IR URETERAL STENT PLACEMENT EXISTING ACCESS LEFT  06/20/2017  . IR URETERAL STENT PLACEMENT EXISTING ACCESS RIGHT  06/20/2017  . LOWER EXTREMITY ANGIOGRAPHY Right 10/25/2016   Procedure: Lower Extremity Angiography;  Surgeon: Sherren Kerns, MD;  Location[facility] INVASIVE CV LAB;  Service: Cardiovascular;  Laterality: Right;    Allergies  Allergen Reactions  .  Penicillins Rash    Has patient had a PCN reaction causing immediate rash, facial/tongue/throat swelling, SOB or lightheadedness with hypotension: Yes Has patient had a PCN reaction causing severe rash involving mucus membranes or skin necrosis: No Has patient had a PCN reaction that required hospitalization No Has patient had a PCN reaction occurring within the last 10 years: No If all of the above answers are "NO", then may proceed with Cephalosporin  use.     Outpatient Encounter Medications as of 07/01/2018  Medication Sig  . acetaminophen (TYLENOL) 650 MG CR tablet Take 650 mg by mouth 3 (three) times daily.   Marland Kitchen alendronate (FOSAMAX) 70 MG tablet Take 70 mg by mouth once a week. Take with a full glass of water on an empty stomach.  . ASPERCREME LIDOCAINE EX Apply 1 application topically 3 (three) times daily as needed. Apply to elbow and wrist for pain  . aspirin EC 81 MG tablet Take 81 mg by mouth every morning.   Marland Kitchen atorvastatin (LIPITOR) 10 MG tablet Take 10 mg by mouth at bedtime.   . Cholecalciferol (QC VITAMIN D3) 2000 units TABS Take 1 tablet by mouth daily.  Marland Kitchen geriatric multivitamins-minerals (ELDERTONIC/GEVRABON) LIQD Take 15 mLs by mouth 2 (two) times daily.  . insulin aspart (NOVOLOG) 100 UNIT/ML injection Inject 10 Units into the skin 2 (two) times daily. Before lunch and dinner.   . insulin glargine (LANTUS) 100 UNIT/ML injection Inject 30 Units into the skin at bedtime.   Marland Kitchen LORazepam (ATIVAN) 1 MG tablet Take 0.5 tablets (0.5 mg total) by mouth 2 (two) times daily.  . Melatonin 10 MG TABS Take 10 mg by mouth at bedtime.  . metFORMIN (GLUCOPHAGE) 1000 MG tablet Take 1,000 mg by mouth 2 (two) times daily with a meal.  . Nutritional Supplements (NUTRITIONAL SUPPLEMENT PO) Take 1 each by mouth 2 (two) times daily. Magic Cup  . potassium chloride SA (K-DUR,KLOR-CON) 20 MEQ tablet Take 20 mEq by mouth daily.  . sertraline (ZOLOFT) 50 MG tablet Take 50 mg by mouth 2 (two) times daily.   . Skin Protectants, Misc. (EUCERIN) cream Apply topically as needed for dry skin.  . tamsulosin (FLOMAX) 0.4 MG CAPS capsule Take 0.8 mg by mouth every evening. Take 2 capsules to = 0.8 mg  . traZODone (DESYREL) 50 MG tablet   . trolamine salicylate (ASPERCREME) 10 % cream Apply 1 application topically 2 (two) times daily. Apply to lower back.   No facility-administered encounter medications on file as of 07/01/2018.     Review of  Systems  GENERAL: No change in appetite, no fatigue, no weight changes, no fever, chills or weakness MOUTH and THROAT: Denies oral discomfort, gingival pain or bleeding RESPIRATORY: no cough, SOB, DOE, wheezing, hemoptysis CARDIAC: No chest pain, edema or palpitations GI: +diarrhea GU: Denies dysuria, frequency, hematuria, or discharge PSYCHIATRIC: Denies feelings of depression or anxiety. No report of hallucinations, insomnia, paranoia, or agitation    Immunization History  Administered Date(s) Administered  . Influenza, High Dose Seasonal PF 03/21/2015, 08/19/2016  . Influenza-Unspecified 04/24/2018  . Pneumococcal Conjugate-13 10/03/2014  . Rotavirus Pentavalent 03/22/1998   Pertinent  Health Maintenance Due  Topic Date Due  . FOOT EXAM  01/20/1956  . OPHTHALMOLOGY EXAM  01/20/1956  . URINE MICROALBUMIN  [birth year]20/1956  . COLONOSCOPY  01/20/1998  . PNA vac Low Risk Adult (2 of 2 - PPSV23) 10/03/2015  . HEMOGLOBIN A1C  10/16/2018  . INFLUENZA VACCINE  Completed   Fall Risk  04/23/2018  Falls in the past year? No    Vitals:   07/01/18 1251  BP: 94/76  Pulse: 91  Resp: 16  Temp: 99.2 F (37.3 C)  TempSrc: Oral  SpO2: 96%  Weight: 169 lb 3.2 oz (76.7 kg)  Height: 6\' 1"  (1.854 m)   Body mass index is 22.32 kg/m.  Physical Exam  GENERAL APPEARANCE: Well nourished. In no acute distress. Normal body habitus SKIN:  Skin is warm and dry.  MOUTH and THROAT: Lips are without lesions. Oral mucosa is moist and without lesions.  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 Speech is clear. Alert to self, disoriented to time and place. PSYCHIATRIC:  Affect and behavior are appropriate   Labs reviewed: Recent Labs    12/27/17 0445 12/27/17 0908 12/28/17 0450  12/29/17 0506 12/29/17 0619  05/12/18 05/20/18 05/26/18  NA 149* 146* 139 141  --    < > 147 153* 144  K 3.0* 3.1* 4.0 3.4*  --    < > 3.2* 4.2 4.1  CL 115* 112* 104 106  --   --   --   --   --   CO2 27 27 28 27   --   --   --   --   --   GLUCOSE 158* 157* 261* 151*  --   --   --   --   --   BUN 25* 22 15 10   --    < > 10 9 12   CREATININE 0.55* 0.55* 0.56* 0.44*  --    < > 0.4* 0.6 0.5*  CALCIUM 7.8* 7.6* 7.8* 7.8*  --   --   --   --   --   MG 1.8  --  1.7 2.1  --   --   --   --   --   PHOS 1.4*  --  1.6*  --  3.0  --   --   --   --    < > = values in this interval not displayed.   Recent Labs    07/02/17 1732 12/26/17 1040  AST 24 14*  ALT 17 18  ALKPHOS 89 65  BILITOT 0.4 1.4*  PROT 7.3 8.4*  ALBUMIN 3.0* 3.9   Recent Labs    07/02/17 1732  12/26/17 1040 12/27/17 0445 12/28/17 0450 12/29/17 0506 04/16/18  WBC 6.3   < > 10.8* 11.8* 10.5 7.0 7.6  NEUTROABS 3.8  --  8.6*  --   --   --  4  HGB 10.1*   < > 12.2* 10.1* 10.6* 10.3* 11.8*  HCT 30.9*   < > 39.0 32.5* 33.9* 32.6* 35*  MCV 89.8   < > 92.4 92.1 90.9 88.8  --   PLT 262   < > 361 331 309 277 254   < > = values in this interval not displayed.    Lab Results  Component Value Date   HGBA1C 7.9 (A) 04/16/2018      Assessment/Plan  1. Hypokalemia -continue KCl ER 20 meq 1 tab daily Lab Results  Component Value Date   K 4.1 05/26/2018    2. Type 2 diabetes mellitus with other specified complication, with long-term current use of insulin (HCC) - continue Lantus 100 units/ml inject 30 units SQ Q HS, metformin  1000 mg 1 tab twice a day and NovoLog 100 units/mL inject 10 units subcu pre-lunch and dinner Lab Results  Component Value Date   HGBA1C 7.9 (A) 04/16/2018    3. Urinary retention - denies concerns for retention, continue tamsulosin 0.4 mg 2 capsules at evening   4. Other insomnia - continue Melatonin 10 mg 1 tab Q HS and Trazodone 50 mg Q HS   5. Chronic midline low back pain without sciatica  -well-controlled will decrease arthritis pain ER 650 mg from 3 times a day to twice a day and continue Aspercreme lidocaine 4% to lower back twice daily   6. Anxiety - mood is stable, continue Ativan 0.5 mg 1 tab twice a day   7. Advance care planning discussion -  Discussed plan of care with IDT   8.  Depression, recurrent -  Continue sertraline 50 mg 1 tab twice a day, followed up by team health psych NP  9. Diarrhea of presumed infectious origin -  Will order for stool culture for C-difficile.      Family/ staff Communication: Discussed plan of care with IDT.  Labs/tests ordered:   Stool culture for C. Difficile and BMP  Goals of care:   Long-term care.   Kenard GowerMonina Medina-Vargas, NP [facility] [phone] (Monday-Friday 8:00 a.m. - 5:00 p.m.) [phone] (after hours)

## 2018-07-06 DIAGNOSIS — A0472 Enterocolitis due to Clostridium difficile, not specified as recurrent: Secondary | ICD-10-CM | POA: Diagnosis not present

## 2018-07-06 DIAGNOSIS — E785 Hyperlipidemia, unspecified: Secondary | ICD-10-CM | POA: Diagnosis not present

## 2018-07-06 DIAGNOSIS — M6281 Muscle weakness (generalized): Secondary | ICD-10-CM | POA: Diagnosis not present

## 2018-07-06 DIAGNOSIS — R2689 Other abnormalities of gait and mobility: Secondary | ICD-10-CM | POA: Diagnosis not present

## 2018-07-06 DIAGNOSIS — R2681 Unsteadiness on feet: Secondary | ICD-10-CM | POA: Diagnosis not present

## 2018-07-06 DIAGNOSIS — D649 Anemia, unspecified: Secondary | ICD-10-CM | POA: Diagnosis not present

## 2018-07-06 DIAGNOSIS — E1142 Type 2 diabetes mellitus with diabetic polyneuropathy: Secondary | ICD-10-CM | POA: Diagnosis not present

## 2018-07-06 DIAGNOSIS — K591 Functional diarrhea: Secondary | ICD-10-CM | POA: Diagnosis not present

## 2018-07-06 LAB — BASIC METABOLIC PANEL
BUN: 16 (ref 4–21)
Creatinine: 0.5 — AB (ref 0.6–1.3)
Glucose: 199
Potassium: 4.4 (ref 3.4–5.3)
Sodium: 138 (ref 137–147)

## 2018-07-07 ENCOUNTER — Non-Acute Institutional Stay (SKILLED_NURSING_FACILITY): Payer: Medicare Other | Admitting: Adult Health

## 2018-07-07 ENCOUNTER — Encounter: Payer: Self-pay | Admitting: Adult Health

## 2018-07-07 DIAGNOSIS — A0472 Enterocolitis due to Clostridium difficile, not specified as recurrent: Secondary | ICD-10-CM | POA: Diagnosis not present

## 2018-07-07 DIAGNOSIS — R2689 Other abnormalities of gait and mobility: Secondary | ICD-10-CM | POA: Diagnosis not present

## 2018-07-07 DIAGNOSIS — D72828 Other elevated white blood cell count: Secondary | ICD-10-CM

## 2018-07-07 DIAGNOSIS — M6281 Muscle weakness (generalized): Secondary | ICD-10-CM | POA: Diagnosis not present

## 2018-07-07 DIAGNOSIS — R2681 Unsteadiness on feet: Secondary | ICD-10-CM | POA: Diagnosis not present

## 2018-07-07 NOTE — Progress Notes (Signed)
Location:  [facility] Room Number: 211-A Place of Service:  SNF (31) Provider:  Kenard GowerMedina-Vargas, Brittny Spangle, NP  Patient Care Team: Pecola LawlessHopper, William F, MD as PCP - General (Internal Medicine) Gillis SantaMedina-Vargas, Gem Conkle C, NP as Nurse Practitioner (Internal Medicine)  Extended Emergency Contact Information Primary Emergency Contact: Arita MissBennett,Carrie  [place] Home Phone: [phone] Mobile Phone: [phone] Relation: Spouse Secondary Emergency Contact: Lanae CrumblyStafford, Sheila Mobile Phone: [phone] Relation: Daughter  Code Status:  DNR  Goals of care: Advanced Directive information Advanced Directives 06/29/2018  Does Patient Have a Medical Advance Directive? -  Type of Advance Directive Out of facility DNR (pink MOST or yellow form)  Does patient want to make changes to medical advance directive? No - Patient declined  Copy of Healthcare Power of Attorney in Chart? No - copy requested  Would patient like information on creating a medical advance directive? -  Pre-existing out of facility DNR order (yellow form or pink MOST form) -     Chief Complaint  Patient presents with  . Acute Visit    Patient is C. difficile positive    HPI:  Pt is a 73 y.o. male seen today for an acute visit secondary to a positive C. difficile.  He is a long-term care resident of [facility].  He has a PMH of CAD, stroke, PAD, alcohol abuse, and MI. He was recently treated for C. Difficile colitis with Metronidazole and Vancomycin. He was reported to have diarrhea and repeat stool culture for C. Difficile was positive. He denies having abdominal pain. Wife is concerned that he might get dehydrated. He gets so tired when he does exercises in therapy. Latest creatinine is 0.50, BUN 16.3, Na 138, K 4.4, wbc 14.0 hgb 11.8. No reported fever. Wife reported that he had 3 diarrhea today.    Past Medical History:  Diagnosis Date  . Anemia   . Arthritis   .  Dementia (HCC)   . Diabetes mellitus without complication (HCC)   . ETOH abuse   . Myocardial infarction (HCC)   . Peripheral arterial disease (HCC)   . Stroke (HCC)   . Wears dentures   . Wears glasses    Past Surgical History:  Procedure Laterality Date  . ABDOMINAL AORTOGRAM N/A 10/25/2016   Procedure: Abdominal Aortogram;  Surgeon: Sherren KernsFields, Charles E, MD;  [facility];  Service: Cardiovascular;  Laterality: N/A;  . BRAIN SURGERY    . COLONOSCOPY  2015   [place]  . CORONARY STENT PLACEMENT     " about 25 years ago (10/29/16)  . ENDARTERECTOMY FEMORAL Right 10/30/2016   Procedure: Eduardo OsierENDARTERECTOMY FEMORAL-RIGHT WITH PATCH GRAFT;  Surgeon: Sherren KernsFields, Charles E, MD;  [facility];  Service: Vascular;  Laterality: Right;  . IR NEPHRO TUBE REMOV/FL  07/04/2017  . IR NEPHRO TUBE REMOV/FL  07/07/2017  . IR NEPHROSTOMY EXCHANGE LEFT  06/20/2017  . IR NEPHROSTOMY EXCHANGE RIGHT  06/20/2017  . IR NEPHROSTOMY PLACEMENT LEFT  06/12/2017  . IR NEPHROSTOMY PLACEMENT RIGHT  06/12/2017  . IR URETERAL STENT PLACEMENT EXISTING ACCESS LEFT  06/20/2017  . IR URETERAL STENT PLACEMENT EXISTING ACCESS RIGHT  06/20/2017  . LOWER EXTREMITY ANGIOGRAPHY Right 10/25/2016   Procedure: Lower Extremity Angiography;  Surgeon: Sherren KernsFields, Charles E, MD;  [facility];  Service: Cardiovascular;  Laterality: Right;    Allergies  Allergen Reactions  . Penicillins Rash    Has patient had a PCN reaction causing  immediate rash, facial/tongue/throat swelling, SOB or lightheadedness with hypotension: Yes Has patient had a PCN reaction causing severe rash involving mucus membranes or skin necrosis: No Has patient had a PCN reaction that required hospitalization No Has patient had a PCN reaction occurring within the last 10 years: No If all of the above answers are "NO", then may proceed with Cephalosporin use.     Outpatient Encounter Medications as of 07/07/2018   Medication Sig  . acetaminophen (TYLENOL) 650 MG CR tablet Take 650 mg by mouth 2 (two) times daily.   Marland Kitchen. alendronate (FOSAMAX) 70 MG tablet Take 70 mg by mouth once a week. Take with a full glass of water on an empty stomach.  . ASPERCREME LIDOCAINE EX Apply 1 application topically See admin instructions. Apply to lower back pain BID, apply to elbow and wrist TID.  Marland Kitchen. aspirin EC 81 MG tablet Take 81 mg by mouth every morning.   Marland Kitchen. atorvastatin (LIPITOR) 10 MG tablet Take 10 mg by mouth at bedtime.   . Cholecalciferol (QC VITAMIN D3) 2000 units TABS Take 1 tablet by mouth daily.  . fluticasone (FLONASE) 50 MCG/ACT nasal spray Place 2 sprays into both nostrils daily.  Marland Kitchen. geriatric multivitamins-minerals (ELDERTONIC/GEVRABON) LIQD Take 15 mLs by mouth 2 (two) times daily.  . insulin aspart (NOVOLOG) 100 UNIT/ML injection Inject 10 Units into the skin 2 (two) times daily. Before lunch and dinner.   . insulin glargine (LANTUS) 100 UNIT/ML injection Inject 30 Units into the skin at bedtime.   Marland Kitchen. LORazepam (ATIVAN) 1 MG tablet Take 0.5 tablets (0.5 mg total) by mouth 2 (two) times daily.  . Melatonin 10 MG TABS Take 10 mg by mouth at bedtime.  . metFORMIN (GLUCOPHAGE) 1000 MG tablet Take 1,000 mg by mouth 2 (two) times daily with a meal.  . Nutritional Supplements (NUTRITIONAL SUPPLEMENT PO) Take 1 each by mouth 3 (three) times daily. Magic Cup  . potassium chloride SA (K-DUR,KLOR-CON) 20 MEQ tablet Take 20 mEq by mouth daily.  . sertraline (ZOLOFT) 50 MG tablet Take 50 mg by mouth 2 (two) times daily.   . Skin Protectants, Misc. (EUCERIN) cream Apply topically as needed for dry skin.  . tamsulosin (FLOMAX) 0.4 MG CAPS capsule Take 0.8 mg by mouth every evening. Take 2 capsules to = 0.8 mg  . traZODone (DESYREL) 50 MG tablet   . [DISCONTINUED] trolamine salicylate (ASPERCREME) 10 % cream Apply 1 application topically 2 (two) times daily. Apply to lower back.   No facility-administered encounter  medications on file as of 07/07/2018.     Review of Systems  GENERAL: No change in appetite, no fatigue, no weight changes, no fever, chills or weakness MOUTH and THROAT: Denies oral discomfort, gingival pain or bleeding RESPIRATORY: no cough, SOB, DOE, wheezing, hemoptysis CARDIAC: No chest pain, edema or palpitations GI: No abdominal pain, +diarrhea GU: Denies dysuria, frequency, hematuria, incontinence, or discharge PSYCHIATRIC: Denies feelings of depression or anxiety. No report of hallucinations, insomnia, paranoia, or agitation   Immunization History  Administered Date(s) Administered  . Influenza, High Dose Seasonal PF 03/21/2015, 08/19/2016  . Influenza-Unspecified 04/24/2018  . Pneumococcal Conjugate-13 10/03/2014  . Rotavirus Pentavalent 03/22/1998   Pertinent  Health Maintenance Due  Topic Date Due  . FOOT EXAM  01/20/1956  . OPHTHALMOLOGY EXAM  [birth year]20/1956  . URINE MICROALBUMIN  01[birth year]20/1958  . COLONOSCOPY  01/20/1998  . PNA vac Low Risk Adult (2 of 2 - PPSV23) 10/03/2015  . HEMOGLOBIN A1C  10/16/2018  . INFLUENZA  VACCINE  Completed   Fall Risk  04/23/2018  Falls in the past year? No    Vitals:   07/07/18 1221  Weight: 169 lb 3.2 oz (76.7 kg)  Height: 6\' 1"  (1.854 m)   Body mass index is 22.32 kg/m.  Physical Exam  GENERAL APPEARANCE: Well nourished. In no acute distress. Normal body habitus SKIN:  Skin is warm and dry.  MOUTH and THROAT: Lips are without lesions. Oral mucosa is moist and without lesions.  RESPIRATORY: Breathing is even & unlabored, BS CTAB CARDIAC: RRR, no murmur,no extra heart sounds, no edema GI: Abdomen soft, normal BS, no masses, no tenderness EXTREMITIES:  Able to move X 4 extremities NEUROLOGICAL: There is no tremor. Speech is clear. Alert to self, disoriented to time and place PSYCHIATRIC:  Affect and behavior are appropriate   Labs reviewed: Recent Labs    12/27/17 0445 12/27/17 0908 12/28/17 0450 12/29/17 0506  12/29/17 0619  05/12/18 05/20/18 05/26/18  NA 149* 146* 139 141  --    < > 147 153* 144  K 3.0* 3.1* 4.0 3.4*  --    < > 3.2* 4.2 4.1  CL 115* 112* 104 106  --   --   --   --   --   CO2 27 27 28 27   --   --   --   --   --   GLUCOSE 158* 157* 261* 151*  --   --   --   --   --   BUN 25* 22 15 10   --    < > 10 9 12   CREATININE 0.55* 0.55* 0.56* 0.44*  --    < > 0.4* 0.6 0.5*  CALCIUM 7.8* 7.6* 7.8* 7.8*  --   --   --   --   --   MG 1.8  --  1.7 2.1  --   --   --   --   --   PHOS 1.4*  --  1.6*  --  3.0  --   --   --   --    < > = values in this interval not displayed.   Recent Labs    12/26/17 1040  AST 14*  ALT 18  ALKPHOS 65  BILITOT 1.4*  PROT 8.4*  ALBUMIN 3.9   Recent Labs    12/26/17 1040 12/27/17 0445 12/28/17 0450 12/29/17 0506 04/16/18  WBC 10.8* 11.8* 10.5 7.0 7.6  NEUTROABS 8.6*  --   --   --  4  HGB 12.2* 10.1* 10.6* 10.3* 11.8*  HCT 39.0 32.5* 33.9* 32.6* 35*  MCV 92.4 92.1 90.9 88.8  --   PLT 361 331 309 277 254    Lab Results  Component Value Date   HGBA1C 7.9 (A) 04/16/2018    Assessment/Plan  1. C. difficile colitis - recurrent, will start Vancomycin 125 mg/2.5 ml QID X 10 days, will refer to gastroenterology, encourage oral fluid intake   2. Other elevated white blood cell (WBC) count  - wbc 14.0, no reported fever, will monitor    Family/ staff Communication: Discussed plan of care with resident.   Labs/tests ordered:  None  Goals of care:   Long-term care.   Kenard Gower, NP [facility] [phone] (Monday-Friday 8:00 a.m. - 5:00 p.m.) [phone] (after hours)

## 2018-07-08 DIAGNOSIS — R2689 Other abnormalities of gait and mobility: Secondary | ICD-10-CM | POA: Diagnosis not present

## 2018-07-08 DIAGNOSIS — R2681 Unsteadiness on feet: Secondary | ICD-10-CM | POA: Diagnosis not present

## 2018-07-08 DIAGNOSIS — M6281 Muscle weakness (generalized): Secondary | ICD-10-CM | POA: Diagnosis not present

## 2018-07-09 DIAGNOSIS — R2689 Other abnormalities of gait and mobility: Secondary | ICD-10-CM | POA: Diagnosis not present

## 2018-07-09 DIAGNOSIS — R2681 Unsteadiness on feet: Secondary | ICD-10-CM | POA: Diagnosis not present

## 2018-07-09 DIAGNOSIS — M6281 Muscle weakness (generalized): Secondary | ICD-10-CM | POA: Diagnosis not present

## 2018-07-10 DIAGNOSIS — R2689 Other abnormalities of gait and mobility: Secondary | ICD-10-CM | POA: Diagnosis not present

## 2018-07-10 DIAGNOSIS — M6281 Muscle weakness (generalized): Secondary | ICD-10-CM | POA: Diagnosis not present

## 2018-07-10 DIAGNOSIS — R2681 Unsteadiness on feet: Secondary | ICD-10-CM | POA: Diagnosis not present

## 2018-07-13 DIAGNOSIS — R2689 Other abnormalities of gait and mobility: Secondary | ICD-10-CM | POA: Diagnosis not present

## 2018-07-13 DIAGNOSIS — M6281 Muscle weakness (generalized): Secondary | ICD-10-CM | POA: Diagnosis not present

## 2018-07-13 DIAGNOSIS — R2681 Unsteadiness on feet: Secondary | ICD-10-CM | POA: Diagnosis not present

## 2018-07-14 DIAGNOSIS — R2681 Unsteadiness on feet: Secondary | ICD-10-CM | POA: Diagnosis not present

## 2018-07-14 DIAGNOSIS — R2689 Other abnormalities of gait and mobility: Secondary | ICD-10-CM | POA: Diagnosis not present

## 2018-07-14 DIAGNOSIS — M6281 Muscle weakness (generalized): Secondary | ICD-10-CM | POA: Diagnosis not present

## 2018-07-15 DIAGNOSIS — R2681 Unsteadiness on feet: Secondary | ICD-10-CM | POA: Diagnosis not present

## 2018-07-15 DIAGNOSIS — R2689 Other abnormalities of gait and mobility: Secondary | ICD-10-CM | POA: Diagnosis not present

## 2018-07-15 DIAGNOSIS — M6281 Muscle weakness (generalized): Secondary | ICD-10-CM | POA: Diagnosis not present

## 2018-07-16 DIAGNOSIS — R2681 Unsteadiness on feet: Secondary | ICD-10-CM | POA: Diagnosis not present

## 2018-07-16 DIAGNOSIS — M6281 Muscle weakness (generalized): Secondary | ICD-10-CM | POA: Diagnosis not present

## 2018-07-16 DIAGNOSIS — R2689 Other abnormalities of gait and mobility: Secondary | ICD-10-CM | POA: Diagnosis not present

## 2018-07-17 DIAGNOSIS — M6281 Muscle weakness (generalized): Secondary | ICD-10-CM | POA: Diagnosis not present

## 2018-07-17 DIAGNOSIS — R2681 Unsteadiness on feet: Secondary | ICD-10-CM | POA: Diagnosis not present

## 2018-07-17 DIAGNOSIS — R2689 Other abnormalities of gait and mobility: Secondary | ICD-10-CM | POA: Diagnosis not present

## 2018-07-21 DIAGNOSIS — R2681 Unsteadiness on feet: Secondary | ICD-10-CM | POA: Diagnosis not present

## 2018-07-21 DIAGNOSIS — A09 Infectious gastroenteritis and colitis, unspecified: Secondary | ICD-10-CM | POA: Diagnosis not present

## 2018-07-21 DIAGNOSIS — R2689 Other abnormalities of gait and mobility: Secondary | ICD-10-CM | POA: Diagnosis not present

## 2018-07-21 DIAGNOSIS — R1314 Dysphagia, pharyngoesophageal phase: Secondary | ICD-10-CM | POA: Diagnosis not present

## 2018-07-21 DIAGNOSIS — A0472 Enterocolitis due to Clostridium difficile, not specified as recurrent: Secondary | ICD-10-CM | POA: Diagnosis not present

## 2018-07-21 DIAGNOSIS — M6281 Muscle weakness (generalized): Secondary | ICD-10-CM | POA: Diagnosis not present

## 2018-07-22 DIAGNOSIS — R2689 Other abnormalities of gait and mobility: Secondary | ICD-10-CM | POA: Diagnosis not present

## 2018-07-22 DIAGNOSIS — M6281 Muscle weakness (generalized): Secondary | ICD-10-CM | POA: Diagnosis not present

## 2018-07-22 DIAGNOSIS — R2681 Unsteadiness on feet: Secondary | ICD-10-CM | POA: Diagnosis not present

## 2018-07-23 DIAGNOSIS — M6281 Muscle weakness (generalized): Secondary | ICD-10-CM | POA: Diagnosis not present

## 2018-07-23 DIAGNOSIS — R2689 Other abnormalities of gait and mobility: Secondary | ICD-10-CM | POA: Diagnosis not present

## 2018-07-23 DIAGNOSIS — R2681 Unsteadiness on feet: Secondary | ICD-10-CM | POA: Diagnosis not present

## 2018-07-24 DIAGNOSIS — R2689 Other abnormalities of gait and mobility: Secondary | ICD-10-CM | POA: Diagnosis not present

## 2018-07-24 DIAGNOSIS — M6281 Muscle weakness (generalized): Secondary | ICD-10-CM | POA: Diagnosis not present

## 2018-07-24 DIAGNOSIS — R2681 Unsteadiness on feet: Secondary | ICD-10-CM | POA: Diagnosis not present

## 2018-07-27 DIAGNOSIS — R2681 Unsteadiness on feet: Secondary | ICD-10-CM | POA: Diagnosis not present

## 2018-07-27 DIAGNOSIS — E1142 Type 2 diabetes mellitus with diabetic polyneuropathy: Secondary | ICD-10-CM | POA: Diagnosis not present

## 2018-07-27 DIAGNOSIS — R2689 Other abnormalities of gait and mobility: Secondary | ICD-10-CM | POA: Diagnosis not present

## 2018-07-27 DIAGNOSIS — M6281 Muscle weakness (generalized): Secondary | ICD-10-CM | POA: Diagnosis not present

## 2018-07-28 ENCOUNTER — Non-Acute Institutional Stay (SKILLED_NURSING_FACILITY): Payer: Medicare Other | Admitting: Internal Medicine

## 2018-07-28 ENCOUNTER — Encounter: Payer: Self-pay | Admitting: Internal Medicine

## 2018-07-28 DIAGNOSIS — A0472 Enterocolitis due to Clostridium difficile, not specified as recurrent: Secondary | ICD-10-CM | POA: Diagnosis not present

## 2018-07-28 DIAGNOSIS — R4189 Other symptoms and signs involving cognitive functions and awareness: Secondary | ICD-10-CM | POA: Diagnosis not present

## 2018-07-28 DIAGNOSIS — R29818 Other symptoms and signs involving the nervous system: Secondary | ICD-10-CM | POA: Diagnosis not present

## 2018-07-28 DIAGNOSIS — M6281 Muscle weakness (generalized): Secondary | ICD-10-CM | POA: Diagnosis not present

## 2018-07-28 DIAGNOSIS — R2681 Unsteadiness on feet: Secondary | ICD-10-CM | POA: Diagnosis not present

## 2018-07-28 DIAGNOSIS — Z794 Long term (current) use of insulin: Secondary | ICD-10-CM

## 2018-07-28 DIAGNOSIS — E1165 Type 2 diabetes mellitus with hyperglycemia: Secondary | ICD-10-CM

## 2018-07-28 DIAGNOSIS — E1142 Type 2 diabetes mellitus with diabetic polyneuropathy: Secondary | ICD-10-CM | POA: Diagnosis not present

## 2018-07-28 DIAGNOSIS — R2689 Other abnormalities of gait and mobility: Secondary | ICD-10-CM | POA: Diagnosis not present

## 2018-07-28 NOTE — Assessment & Plan Note (Signed)
04/16/2018 A1c at goal with a value of 7.9% A1c should be updated this month

## 2018-07-28 NOTE — Progress Notes (Signed)
  NURSING HOME LOCATION:  [facility] ROOM NUMBER:  211  CODE STATUS:  DNR  PCP:  Douglass Rivers MD  This is a nursing facility follow up for chronic medical diagnoses  Interim medical record and care since last [facility] visit was updated with review of diagnostic studies and change in clinical status since last visit were documented.  HPI: The patient has been a resident of this facility since 12/29/2017 after being hospitalized with DKA and Klebsiella ESBL UTI associated with acute metabolic encephalopathy.  He has baseline dementia according to his spouse.  Other diagnoses include coronary artery disease, history of stroke, peripheral arterial disease, myocardial infarction, and history of alcohol abuse.  His history includes "brain surgery" without specific description. Fasting glucoses have ranged from 140-198.  A1c indicated his DM was adequately controlled on 04/16/2018 with a value of 7.9%.  At the time of his admission to the hospital for DKA 7/5 his A1c was 11.1%. He has had persistent C. difficile colitis.  [facility] saw the patient 07/21/2018 and prescribed vancomycin 500 mg 4 times daily for 21 days.  They will follow-up in 2 weeks.  Enteric precautions were to be continued.  His wife states that the Gastroenterologist plans to admit him and perform an enteric stool transplant if he fails to respond to this dose of Vanc.  Review of systems: Dementia invalidated responses. Date given as 2021.  He could not name the president. His wife believes he is having some abdominal discomfort as well as flank discomfort as he is "restless & arching his back".  She states he has a history of recurrent UTIs related to kidney stones.  She states that he does have a high pain threshold and is not taking pain medicines on a regular basis.  She believes the stool is becoming more solid.  Physical exam:  Pertinent or positive findings: He is thin and suboptimally nourished.  Hair is thin and he  has pattern alopecia.  Elbert Ewings is present.  Ptosis is present on the left.  He is completely edentulous.  Breath sounds are decreased.  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  Pedal pulses are decreased on the right.He had great difficulty following commands such as raising extremity to test strength.  General appearance: no acute distress, increased work of breathing is present.   Lymphatic: No lymphadenopathy about the head, neck, axilla. Eyes: No conjunctival inflammation or lid edema is present. There is no scleral icterus. Ears:  External ear exam shows no significant lesions or deformities.   Nose:  External nasal examination shows no deformity or inflammation. Nasal mucosa are pink and moist without lesions, exudates Oral exam:  Lips and gums are healthy appearing. There is no oropharyngeal erythema or exudate. Neck:  No thyromegaly, masses, tenderness noted.    Heart:  Normal rate and regular rhythm. S1 and S2 normal without gallop, murmur, click, rub .  Lungs:  without wheezes, rhonchi, rales, rubs. Abdomen: Bowel sounds are normal. Abdomen is soft and nontender with no organomegaly, hernias, masses. GU: Deferred  Extremities:  No cyanosis, clubbing, edema  Neurologic exam : Balance, Rhomberg, finger to nose testing could not be completed due to clinical state Skin: Warm & dry w/o tenting. No significant lesions or rash.  See summary under each active problem in the Problem List with associated updated therapeutic plan

## 2018-07-28 NOTE — Patient Instructions (Signed)
See assessment and plan under each diagnosis in the problem list and acutely for this visit 

## 2018-07-28 NOTE — Assessment & Plan Note (Addendum)
Severe dementia persists without behavioral abnormalities Dementia most likely related to history of strokes and alcohol abuse

## 2018-07-29 DIAGNOSIS — M6281 Muscle weakness (generalized): Secondary | ICD-10-CM | POA: Diagnosis not present

## 2018-07-29 DIAGNOSIS — E1142 Type 2 diabetes mellitus with diabetic polyneuropathy: Secondary | ICD-10-CM | POA: Diagnosis not present

## 2018-07-29 DIAGNOSIS — R2681 Unsteadiness on feet: Secondary | ICD-10-CM | POA: Diagnosis not present

## 2018-07-29 DIAGNOSIS — R2689 Other abnormalities of gait and mobility: Secondary | ICD-10-CM | POA: Diagnosis not present

## 2018-07-29 LAB — HEMOGLOBIN A1C: Hemoglobin A1C: 7.7

## 2018-07-29 NOTE — Assessment & Plan Note (Signed)
Enteric stool transplant if C dif colitis fails to resolve

## 2018-07-30 ENCOUNTER — Non-Acute Institutional Stay (SKILLED_NURSING_FACILITY): Payer: Medicare Other | Admitting: Adult Health

## 2018-07-30 ENCOUNTER — Encounter: Payer: Self-pay | Admitting: Adult Health

## 2018-07-30 DIAGNOSIS — A0472 Enterocolitis due to Clostridium difficile, not specified as recurrent: Secondary | ICD-10-CM | POA: Diagnosis not present

## 2018-07-30 DIAGNOSIS — E1142 Type 2 diabetes mellitus with diabetic polyneuropathy: Secondary | ICD-10-CM | POA: Diagnosis not present

## 2018-07-30 DIAGNOSIS — F419 Anxiety disorder, unspecified: Secondary | ICD-10-CM | POA: Diagnosis not present

## 2018-07-30 DIAGNOSIS — Z20828 Contact with and (suspected) exposure to other viral communicable diseases: Secondary | ICD-10-CM | POA: Diagnosis not present

## 2018-07-30 DIAGNOSIS — R2681 Unsteadiness on feet: Secondary | ICD-10-CM | POA: Diagnosis not present

## 2018-07-30 DIAGNOSIS — F329 Major depressive disorder, single episode, unspecified: Secondary | ICD-10-CM | POA: Diagnosis not present

## 2018-07-30 DIAGNOSIS — E785 Hyperlipidemia, unspecified: Secondary | ICD-10-CM | POA: Diagnosis not present

## 2018-07-30 DIAGNOSIS — R2689 Other abnormalities of gait and mobility: Secondary | ICD-10-CM | POA: Diagnosis not present

## 2018-07-30 DIAGNOSIS — E1169 Type 2 diabetes mellitus with other specified complication: Secondary | ICD-10-CM

## 2018-07-30 DIAGNOSIS — E876 Hypokalemia: Secondary | ICD-10-CM

## 2018-07-30 DIAGNOSIS — M6281 Muscle weakness (generalized): Secondary | ICD-10-CM | POA: Diagnosis not present

## 2018-07-30 MED ORDER — LORAZEPAM 1 MG PO TABS: 0.5000 mg | ORAL_TABLET | Freq: Two times a day (BID) | ORAL | 0 refills | Status: DC

## 2018-07-30 NOTE — Progress Notes (Signed)
Location:  [facility] Room Number: 211-A Place of Service:  SNF (31) Provider:  Kenard Gower, NP  Patient Care Team: Pecola Lawless, MD as PCP - General (Internal Medicine) Gillis Santa, NP as Nurse Practitioner (Internal Medicine)  Extended Emergency Contact Information Primary Emergency Contact: Arita Miss [place] Home Phone: [phone] Mobile Phone: [phone] Relation: Spouse Secondary Emergency Contact: Lanae Crumbly Mobile Phone: [phone] Relation: Daughter  Code Status:  DNR  Goals of care: Advanced Directive information Advanced Directives 07/28/2018  Does Patient Have a Medical Advance Directive? Yes  Type of Advance Directive Out of facility DNR (pink MOST or yellow form)  Does patient want to make changes to medical advance directive? No - Patient declined  Copy of Healthcare Power of Attorney in Chart? No - copy requested  Would patient like information on creating a medical advance directive? -  Pre-existing out of facility DNR order (yellow form or pink MOST form) -     Chief Complaint  Patient presents with  . Acute Visit    Patient seen to assess the continued need for Ativan    HPI:  Pt is a 73 y.o. Alan [name] seen today for an acute visit to determine the continued need of Ativan.  He is a long-term care resident of [facility].  He has a PMH of CAD, stroke, PAD, alcohol abuse, and MI.   He was seen in the room today. He recently had a GI consult and was ordered to continue Vancomycin orally X 21 days. He verbalized having 2 watery stools today. His mood is stable and verbalized wanting to go home. He was told that he needs to have his C-difficile treated completely so he can go home with wife.    Past Medical History:  Diagnosis Date  . Anemia   . Arthritis   . Dementia (HCC)   . DM (diabetes mellitus), type 2 with peripheral vascular complications (HCC)     . ETOH abuse   . Myocardial infarction (HCC)   . Peripheral arterial disease (HCC)   . Stroke [facility])    Past Surgical History:  Procedure Laterality Date  . ABDOMINAL AORTOGRAM N/A 10/25/2016   Procedure: Abdominal Aortogram;  Surgeon: Sherren Kerns, MD;  Location: [facility] INVASIVE CV LAB;  Service: Cardiovascular;  Laterality: N/A;  . BRAIN SURGERY    . COLONOSCOPY  2015   Negative, performed by GI in [place]  . CORONARY STENT PLACEMENT     " about 25 years ago (10/29/16)  . ENDARTERECTOMY FEMORAL Right 10/30/2016   Procedure: Eduardo Osier WITH PATCH GRAFT;  Surgeon: Sherren Kerns, MD;  Location: [facility] OR;  Service: Vascular;  Laterality: Right;  . IR NEPHRO TUBE REMOV/FL  07/04/2017  . IR NEPHRO TUBE REMOV/FL  07/07/2017  . IR NEPHROSTOMY EXCHANGE LEFT  06/20/2017  . IR NEPHROSTOMY EXCHANGE RIGHT  06/20/2017  . IR NEPHROSTOMY PLACEMENT LEFT  06/12/2017  . IR NEPHROSTOMY PLACEMENT RIGHT  06/12/2017  . IR URETERAL STENT PLACEMENT EXISTING ACCESS LEFT  06/20/2017  . IR URETERAL STENT PLACEMENT EXISTING ACCESS RIGHT  06/20/2017  . LOWER EXTREMITY ANGIOGRAPHY Right 10/25/2016   Procedure: Lower Extremity Angiography;  Surgeon: Sherren Kerns, MD;  Location: [facility] INVASIVE CV LAB;  Service: Cardiovascular;  Laterality: Right;    Allergies  Allergen Reactions  . Penicillins Rash    Has patient had a PCN reaction causing immediate rash, facial/tongue/throat swelling, SOB or lightheadedness with hypotension: Yes Has patient  had a PCN reaction causing severe rash involving mucus membranes or skin necrosis: No Has patient had a PCN reaction that required hospitalization No Has patient had a PCN reaction occurring within the last Alan years: No If all of the above answers are "NO", then may proceed with Cephalosporin use.     Outpatient Encounter Medications as of 07/30/2018  Medication Sig  . acetaminophen (TYLENOL) 500 MG tablet Take 500 mg by mouth every 6 (six) hours as  needed for mild pain.  Marland Kitchen. alendronate (FOSAMAX) 70 MG tablet Take 70 mg by mouth once a week. Take with a full glass of water on an empty stomach.  . ASPERCREME LIDOCAINE EX Apply 1 application topically See admin instructions. Apply to lower back pain BID, apply to elbow and wrist TID.  Marland Kitchen. aspirin EC 81 MG tablet Take 81 mg by mouth every morning.   Marland Kitchen. atorvastatin (LIPITOR) Alan MG tablet Take Alan mg by mouth at bedtime.   . Cholecalciferol (QC VITAMIN D3) 2000 units TABS Take 1 tablet by mouth daily.  . fluticasone (FLONASE) 50 MCG/ACT nasal spray Place 2 sprays into both nostrils daily.  Marland Kitchen. geriatric multivitamins-minerals (ELDERTONIC/GEVRABON) LIQD Take 15 mLs by mouth 2 (two) times daily.  . insulin aspart (NOVOLOG) 100 UNIT/ML injection Inject Alan Units into the skin 2 (two) times daily. Before lunch and dinner.   . insulin glargine (LANTUS) 100 UNIT/ML injection Inject 30 Units into the skin at bedtime.   Marland Kitchen. LORazepam (ATIVAN) 1 MG tablet Take 0.5 tablets (0.5 mg total) by mouth 2 (two) times daily.  . Melatonin Alan MG TABS Take Alan mg by mouth at bedtime.  . metFORMIN (GLUCOPHAGE) 1000 MG tablet Take 1,000 mg by mouth 2 (two) times daily with a meal.  . Nutritional Supplements (NUTRITIONAL SUPPLEMENT PO) Take 1 each by mouth 3 (three) times daily. Magic Cup  . oseltamivir (TAMIFLU) 75 MG capsule Take 75 mg by mouth daily.  . potassium chloride SA (K-DUR,KLOR-CON) 20 MEQ tablet Take 20 mEq by mouth daily.  . sertraline (ZOLOFT) 50 MG tablet Take 50 mg by mouth 2 (two) times daily.   . Skin Protectants, Misc. (EUCERIN) cream Apply topically as needed for dry skin.  . tamsulosin (FLOMAX) 0.4 MG CAPS capsule Take 0.8 mg by mouth every evening. Take 2 capsules to = 0.8 mg  . traZODone (DESYREL) 50 MG tablet Take 50 mg by mouth at bedtime.   . Vancomycin HCl 250 MG/5ML SOLR Take Alan mLs by mouth every 6 (six) hours.  . [DISCONTINUED] acetaminophen (TYLENOL) 650 MG CR tablet Take 650 mg by mouth 2 (two)  times daily.    No facility-administered encounter medications on file as of 07/30/2018.     Review of Systems  GENERAL: No change in appetite, no fatigue, no weight changes, no fever, chills or weakness MOUTH and THROAT: Denies oral discomfort, gingival pain or bleeding RESPIRATORY: no cough, SOB, DOE, wheezing, hemoptysis CARDIAC: No chest pain, edema or palpitations GI: + diarrhea GU: Denies dysuria, frequency, hematuria, incontinence, or discharge NEUROLOGICAL: Denies dizziness, syncope, numbness, or headache PSYCHIATRIC: Denies feelings of depression or anxiety. No report of hallucinations, insomnia, paranoia, or agitation    Immunization History  Administered Date(s) Administered  . Influenza, High Dose Seasonal PF 03/21/2015, 08/19/2016  . Influenza-Unspecified 04/24/2018  . Pneumococcal Conjugate-13 10/03/2014  . Rotavirus Pentavalent 03/22/1998   Pertinent  Health Maintenance Due  Topic Date Due  . FOOT EXAM  01/20/1956  . OPHTHALMOLOGY EXAM  01/20/1956  .  URINE MICROALBUMIN  [birth year]20/1956  . COLONOSCOPY  01/20/1998  . PNA vac Low Risk Adult (2 of 2 - PPSV23) 10/03/2015  . HEMOGLOBIN A1C  10/16/2018  . INFLUENZA VACCINE  Completed   Fall Risk  Alan/31/2019  Falls in the past year? No     Vitals:   07/30/18 1419  Weight: 172 lb 3.2 oz (78.1 kg)  Height: 6\' 1"  (1.854 m)   Body mass index is 22.72 kg/m.  Physical Exam  GENERAL APPEARANCE: Well nourished. In no acute distress. Normal body habitus SKIN:  Skin is warm and dry.  MOUTH and THROAT: Lips are without lesions. Oral mucosa is moist and without lesions. Tongue is normal in shape, size, and color and without lesions RESPIRATORY: Breathing is even & unlabored, BS CTAB CARDIAC: RRR, no murmur,no extra heart sounds, no edema GI: Abdomen soft, normal BS, no masses, no tenderness EXTREMITIES:  Left BKA,  NEUROLOGICAL: There is no tremor. Speech is clear. Alert and oriented X 3. PSYCHIATRIC:  Affect and  behavior are appropriate   Labs reviewed: Recent Labs    12/27/17 0445 12/27/17 0908 12/28/17 0450 12/29/17 0506 12/29/17 0619  05/20/18 05/26/18 07/06/18  NA 149* 146* 139 141  --    < > 153* 144 138  K 3.0* 3.1* 4.0 3.4*  --    < > 4.2 4.1 4.4  CL 115* 112* 104 106  --   --   --   --   --   CO2 27 27 28 27   --   --   --   --   --   GLUCOSE 158* 157* 261* 151*  --   --   --   --   --   BUN 25* 22 15 Alan   --    < > 9 12 16   CREATININE 0.55* 0.55* 0.56* 0.44*  --    < > 0.6 0.5* 0.5*  CALCIUM 7.8* 7.6* 7.8* 7.8*  --   --   --   --   --   MG 1.8  --  1.7 2.1  --   --   --   --   --   PHOS 1.4*  --  1.6*  --  3.0  --   --   --   --    < > = values in this interval not displayed.   Recent Labs    12/26/17 1040  AST 14*  ALT 18  ALKPHOS 65  BILITOT 1.4*  PROT 8.4*  ALBUMIN 3.9   Recent Labs    12/26/17 1040 12/27/17 0445 12/28/17 0450 12/29/17 0506 Alan/24/19  WBC Alan.8* 11.8* Alan.5 7.0 7.6  NEUTROABS 8.6*  --   --   --  4  HGB 12.2* Alan.1* Alan.6* Alan.3* 11.8*  HCT 39.0 32.5* 33.9* 32.6* 35*  MCV 92.4 92.1 90.9 88.8  --   PLT 361 331 309 277 254    Lab Results  Component Value Date   HGBA1C 7.9 (A) Alan/24/2019    Assessment/Plan   1. C. difficile colitis -Continue vancomycin 250 mg / 5 mL give Alan mL p.o. x21 days total, observe enteric precautions  2. Hyperlipidemia associated with type 2 diabetes mellitus (HCC) -Continue atorvastatin Alan mg 1 tab at bedtime, metformin 1000 mg 1 tab twice a day, Lantus 100 unit/mL inject 30 units subcutaneously at bedtime and NovoLog 100 unit/mL inject Alan units pre-lunch and dinner, will check lipid panel Lab Results  Component Value Date   HGBA1C 7.9 (  A) Alan/24/2019    3. Hypokalemia Lab Results  Component Value Date   K 4.4 07/06/2018  -Continue KCl ER 20 meq 1 tab daily   4. Major depressive disorder with single episode, remission status unspecified -Mood is stable, continue sertraline 50 mg 1 tab twice a day and trazodone  50 mg 1 tab nightly  5. Exposure to the flu -Continue Tamiflu  6.  Anxiety -Mood is a stable, continue Ativan 0.5 mg 1 tab twice daily   Family/ staff Communication: Discussed plan of care with resident.  Labs/tests ordered:  Lipid Panel  Goals of care:   Long-term care.   Kenard Gower, NP [facility] [phone] (Monday-Friday 8:00 a.m. - 5:00 p.m.) [phone] (after hours)

## 2018-07-31 DIAGNOSIS — R2689 Other abnormalities of gait and mobility: Secondary | ICD-10-CM | POA: Diagnosis not present

## 2018-07-31 DIAGNOSIS — M6281 Muscle weakness (generalized): Secondary | ICD-10-CM | POA: Diagnosis not present

## 2018-07-31 DIAGNOSIS — E1142 Type 2 diabetes mellitus with diabetic polyneuropathy: Secondary | ICD-10-CM | POA: Diagnosis not present

## 2018-07-31 DIAGNOSIS — R2681 Unsteadiness on feet: Secondary | ICD-10-CM | POA: Diagnosis not present

## 2018-08-03 DIAGNOSIS — E1142 Type 2 diabetes mellitus with diabetic polyneuropathy: Secondary | ICD-10-CM | POA: Diagnosis not present

## 2018-08-03 DIAGNOSIS — R2681 Unsteadiness on feet: Secondary | ICD-10-CM | POA: Diagnosis not present

## 2018-08-03 DIAGNOSIS — M6281 Muscle weakness (generalized): Secondary | ICD-10-CM | POA: Diagnosis not present

## 2018-08-03 DIAGNOSIS — R2689 Other abnormalities of gait and mobility: Secondary | ICD-10-CM | POA: Diagnosis not present

## 2018-08-04 DIAGNOSIS — E785 Hyperlipidemia, unspecified: Secondary | ICD-10-CM | POA: Diagnosis not present

## 2018-08-04 DIAGNOSIS — R2681 Unsteadiness on feet: Secondary | ICD-10-CM | POA: Diagnosis not present

## 2018-08-04 DIAGNOSIS — R2689 Other abnormalities of gait and mobility: Secondary | ICD-10-CM | POA: Diagnosis not present

## 2018-08-04 DIAGNOSIS — E1142 Type 2 diabetes mellitus with diabetic polyneuropathy: Secondary | ICD-10-CM | POA: Diagnosis not present

## 2018-08-04 DIAGNOSIS — Z112 Encounter for screening for other bacterial diseases: Secondary | ICD-10-CM | POA: Diagnosis not present

## 2018-08-04 DIAGNOSIS — M6281 Muscle weakness (generalized): Secondary | ICD-10-CM | POA: Diagnosis not present

## 2018-08-04 LAB — LIPID PANEL
CHOLESTEROL: 91 (ref 0–200)
HDL: 37 (ref 35–70)
LDL Cholesterol: 15
LDL/HDL RATIO: 2.5
Triglycerides: 197 — AB (ref 40–160)

## 2018-08-06 ENCOUNTER — Ambulatory Visit: Payer: Medicare (Managed Care) | Admitting: Family

## 2018-08-06 ENCOUNTER — Encounter (HOSPITAL_COMMUNITY): Payer: Medicare (Managed Care)

## 2018-08-06 DIAGNOSIS — A4189 Other specified sepsis: Secondary | ICD-10-CM | POA: Diagnosis not present

## 2018-08-06 DIAGNOSIS — R319 Hematuria, unspecified: Secondary | ICD-10-CM | POA: Diagnosis not present

## 2018-08-06 DIAGNOSIS — E1142 Type 2 diabetes mellitus with diabetic polyneuropathy: Secondary | ICD-10-CM | POA: Diagnosis not present

## 2018-08-06 DIAGNOSIS — B961 Klebsiella pneumoniae [K. pneumoniae] as the cause of diseases classified elsewhere: Secondary | ICD-10-CM | POA: Diagnosis not present

## 2018-08-06 DIAGNOSIS — N390 Urinary tract infection, site not specified: Secondary | ICD-10-CM | POA: Diagnosis not present

## 2018-08-10 ENCOUNTER — Other Ambulatory Visit: Payer: Self-pay | Admitting: Gastroenterology

## 2018-08-10 DIAGNOSIS — R1030 Lower abdominal pain, unspecified: Secondary | ICD-10-CM

## 2018-08-10 DIAGNOSIS — R197 Diarrhea, unspecified: Secondary | ICD-10-CM | POA: Diagnosis not present

## 2018-08-10 DIAGNOSIS — Z8619 Personal history of other infectious and parasitic diseases: Secondary | ICD-10-CM | POA: Diagnosis not present

## 2018-08-17 ENCOUNTER — Ambulatory Visit (HOSPITAL_COMMUNITY)
Admission: RE | Admit: 2018-08-17 | Discharge: 2018-08-17 | Disposition: A | Discharge status: Home / Self Care | Payer: Medicare Other | Source: Ambulatory Visit | Attending: Gastroenterology | Admitting: Gastroenterology

## 2018-08-17 ENCOUNTER — Encounter (HOSPITAL_COMMUNITY): Payer: Self-pay

## 2018-08-17 DIAGNOSIS — R1030 Lower abdominal pain, unspecified: Secondary | ICD-10-CM | POA: Insufficient documentation

## 2018-08-17 DIAGNOSIS — R197 Diarrhea, unspecified: Secondary | ICD-10-CM

## 2018-08-17 LAB — GLUCOSE, CAPILLARY: Glucose-Capillary: 95 mg/dL (ref 70–99)

## 2018-08-17 LAB — POCT I-STAT CREATININE: CREATININE: 0.7 mg/dL (ref 0.61–1.24)

## 2018-08-17 MED ORDER — IOHEXOL 300 MG/ML  SOLN
100.0000 mL | Freq: Once | INTRAMUSCULAR | Status: AC | PRN
  Administered 2018-08-17: 100 mL via INTRAVENOUS

## 2018-08-17 MED ORDER — IOHEXOL 300 MG/ML  SOLN: 100.0000 mL | Freq: Once | INTRAMUSCULAR | Status: DC | PRN

## 2018-08-24 ENCOUNTER — Non-Acute Institutional Stay (SKILLED_NURSING_FACILITY): Payer: Medicare Other | Admitting: Internal Medicine

## 2018-08-24 ENCOUNTER — Encounter: Payer: Self-pay | Admitting: Internal Medicine

## 2018-08-24 DIAGNOSIS — Z8673 Personal history of transient ischemic attack (TIA), and cerebral infarction without residual deficits: Secondary | ICD-10-CM | POA: Diagnosis not present

## 2018-08-24 DIAGNOSIS — E785 Hyperlipidemia, unspecified: Secondary | ICD-10-CM | POA: Diagnosis not present

## 2018-08-24 DIAGNOSIS — R4189 Other symptoms and signs involving cognitive functions and awareness: Secondary | ICD-10-CM

## 2018-08-24 DIAGNOSIS — E1169 Type 2 diabetes mellitus with other specified complication: Secondary | ICD-10-CM

## 2018-08-24 DIAGNOSIS — R29818 Other symptoms and signs involving the nervous system: Secondary | ICD-10-CM | POA: Diagnosis not present

## 2018-08-24 DIAGNOSIS — E876 Hypokalemia: Secondary | ICD-10-CM

## 2018-08-24 DIAGNOSIS — E1165 Type 2 diabetes mellitus with hyperglycemia: Secondary | ICD-10-CM

## 2018-08-24 DIAGNOSIS — Z794 Long term (current) use of insulin: Secondary | ICD-10-CM

## 2018-08-24 DIAGNOSIS — A0472 Enterocolitis due to Clostridium difficile, not specified as recurrent: Secondary | ICD-10-CM | POA: Diagnosis not present

## 2018-08-24 MED ORDER — LORAZEPAM 1 MG PO TABS: 0.5000 mg | ORAL_TABLET | Freq: Two times a day (BID) | ORAL | 0 refills | Status: DC

## 2018-08-24 NOTE — Progress Notes (Signed)
Location:    [facility] Room Number: 219/A Place of Service:  SNF [id number]) Provider:  Edmon CrapeArlo Vi Whitesel PA-C  Pecola LawlessHopper, William F, MD  Patient Care Team: Pecola LawlessHopper, William F, MD as PCP - General (Internal Medicine) Gillis SantaMedina-Vargas, Monina C, NP as Nurse Practitioner (Internal Medicine)  Extended Emergency Contact Information Primary Emergency Contact: Arita MissBennett,Carrie  [place] Home Phone: [phone] Mobile Phone: [phone] Relation: Spouse Secondary Emergency Contact: Lanae CrumblyStafford, Sheila Mobile Phone: [phone] Relation: Daughter  Code Status:  DNR Goals of care: Advanced Directive information Advanced Directives 08/24/2018  Does Patient Have a Medical Advance Directive? -  Type of Advance Directive Out of facility DNR (pink MOST or yellow form)  Does patient want to make changes to medical advance directive? No - Patient declined  Copy of Healthcare Power of Attorney in Chart? No - copy requested  Would patient like information on creating a medical advance directive? -  Pre-existing out of facility DNR order (yellow form or pink MOST form) -     Chief Complaint  Patient presents with  . Medical Management of Chronic Issues    Routine visit of medical management   Medical management of chronic medical conditions including history of recurrent CDF- dementia with depression- history of type 2 diabetes- coronary artery disease history of CVA hyperlipidemia  HPI:  Pt is a 73 y.o. male seen today for medical management of chronic diseases.  As noted above. Most acute issue recently has been recurrent C. difficile he has been followed by GI and has been on a prolonged course of vancomycin most recent order was to continue this for 14 additional days which will end later this week-apparently he does have GI follow-up later this week as well.  According his wife the stools are getting a little bit more consistently solid although still  has some element of diarrhea.  Apparently complained of some abdominal discomfort over the weekend according his wife but when I saw him today he was denying any discomfort.  Currently he is sitting in his wheelchair comfortably and does not really have any complaints he is somewhat of a poor historian secondary to dementia.  Regards to his other medical conditions he does have a history of type 2 diabetes he is on Glucophage thousand milligrams twice daily as well as Lantus 30 units daily.  He is also on NovoLog 10 units before lunch and dinner.  Hemoglobin A1c was satisfactory at 7.7 approximately a month ago in early February--- CBGs have been able to access are largely in the lower mid 100s occasionally spike in the 200s but this is fairly rare.  He continues on Lantus 30 units a day as well as Glucophage thousand milligrams twice daily in addition to NovoLog 10 units before lunch and dinner.  It appears at one point he had lost about 5 to 6 pounds but according to his wife and nursing staff he is now eating better the last few days and optimistic he will be gaining this back he is on supplements including Magic cup and Eldertonic.  In regards to dementia with depression-she does have some history of alcohol abuse in the past but he appears to be doing well with supportive care he continues on Zoloft for depression and trazodone for insomnia and does have Ativan 0.5 mg twice daily with anxiety which per nursing appears to help significantly.  Regards to hyperlipidemia he is on atorvastatin LDL was 15 cholesterol was 91 on  lab done last month.  He does have a history of coronary artery disease as well as CVA he continues on aspirin as well as a statin he does have some left-sided weakness according to his wife this is been chronic even since childhood.  Currently he is sitting in his wheelchair comfortably he does have a very supportive wife- does not have any complaints he is very pleasant  and does follow simple verbal commands    Past Medical History:  Diagnosis Date  . Anemia   . Arthritis   . Dementia (HCC)   . DM (diabetes mellitus), type 2 with peripheral vascular complications (HCC)   . ETOH abuse   . Myocardial infarction (HCC)   . Peripheral arterial disease (HCC)   . Stroke [facility])    Past Surgical History:  Procedure Laterality Date  . ABDOMINAL AORTOGRAM N/A 10/25/2016   Procedure: Abdominal Aortogram;  Surgeon: Sherren Kerns, MD;  Location: [facility] INVASIVE CV LAB;  Service: Cardiovascular;  Laterality: N/A;  . BRAIN SURGERY    . COLONOSCOPY  2015   Negative, performed by GI in [place]  . CORONARY STENT PLACEMENT     " about 25 years ago (10/29/16)  . ENDARTERECTOMY FEMORAL Right 10/30/2016   Procedure: Eduardo Osier WITH PATCH GRAFT;  Surgeon: Sherren Kerns, MD;  Location: [facility] OR;  Service: Vascular;  Laterality: Right;  . IR NEPHRO TUBE REMOV/FL  07/04/2017  . IR NEPHRO TUBE REMOV/FL  07/07/2017  . IR NEPHROSTOMY EXCHANGE LEFT  06/20/2017  . IR NEPHROSTOMY EXCHANGE RIGHT  06/20/2017  . IR NEPHROSTOMY PLACEMENT LEFT  06/12/2017  . IR NEPHROSTOMY PLACEMENT RIGHT  06/12/2017  . IR URETERAL STENT PLACEMENT EXISTING ACCESS LEFT  06/20/2017  . IR URETERAL STENT PLACEMENT EXISTING ACCESS RIGHT  06/20/2017  . LOWER EXTREMITY ANGIOGRAPHY Right 10/25/2016   Procedure: Lower Extremity Angiography;  Surgeon: Sherren Kerns, MD;  Location: [facility] INVASIVE CV LAB;  Service: Cardiovascular;  Laterality: Right;    Allergies  Allergen Reactions  . Penicillins Rash    Has patient had a PCN reaction causing immediate rash, facial/tongue/throat swelling, SOB or lightheadedness with hypotension: Yes Has patient had a PCN reaction causing severe rash involving mucus membranes or skin necrosis: No Has patient had a PCN reaction that required hospitalization No Has patient had a PCN reaction occurring within the last 10 years: No If all of the above answers  are "NO", then may proceed with Cephalosporin use.     Allergies as of 08/24/2018      Reactions   Penicillins Rash   Has patient had a PCN reaction causing immediate rash, facial/tongue/throat swelling, SOB or lightheadedness with hypotension: Yes Has patient had a PCN reaction causing severe rash involving mucus membranes or skin necrosis: No Has patient had a PCN reaction that required hospitalization No Has patient had a PCN reaction occurring within the last 10 years: No If all of the above answers are "NO", then may proceed with Cephalosporin use.      Medication List       Accurate as of August 24, 2018 12:19 PM. Always use your most recent med list.        acetaminophen 500 MG tablet Commonly known as:  TYLENOL Take 500 mg by mouth every 6 (six) hours as needed for mild pain.   alendronate 70 MG tablet Commonly known as:  FOSAMAX Take 70 mg by mouth once a week. Take with a full glass of water on an empty stomach.  ASPERCREME LIDOCAINE EX Apply 1 application topically See admin instructions. Apply to lower back pain BID, apply to elbow and wrist TID.   aspirin EC 81 MG tablet Take 81 mg by mouth every morning.   atorvastatin 10 MG tablet Commonly known as:  LIPITOR Take 10 mg by mouth at bedtime.   eucerin cream Apply topically as needed for dry skin.   fluticasone 50 MCG/ACT nasal spray Commonly known as:  FLONASE Place 2 sprays into both nostrils daily.   geriatric multivitamins-minerals Liqd Take 15 mLs by mouth 2 (two) times daily.   insulin glargine 100 UNIT/ML injection Commonly known as:  LANTUS Inject 30 Units into the skin at bedtime.   LORazepam 1 MG tablet Commonly known as:  ATIVAN Take 0.5 tablets (0.5 mg total) by mouth 2 (two) times daily.   Melatonin 10 MG Tabs Take 10 mg by mouth at bedtime.   metFORMIN 1000 MG tablet Commonly known as:  GLUCOPHAGE Take 1,000 mg by mouth 2 (two) times daily with a meal.   NOVOLOG 100 UNIT/ML  injection Generic drug:  insulin aspart Inject 10 Units into the skin 2 (two) times daily. Before lunch and dinner.   NUTRITIONAL SUPPLEMENT PO Take 1 each by mouth 3 (three) times daily. Magic Cup   potassium chloride SA 20 MEQ tablet Commonly known as:  K-DUR,KLOR-CON Take 20 mEq by mouth daily.   QC VITAMIN D3 50 MCG (2000 UT) Tabs Generic drug:  Cholecalciferol Take 1 tablet by mouth daily.   sertraline 50 MG tablet Commonly known as:  ZOLOFT Take 50 mg by mouth 2 (two) times daily.   tamsulosin 0.4 MG Caps capsule Commonly known as:  FLOMAX Take 0.8 mg by mouth every evening. Take 2 capsules to = 0.8 mg   traZODone 50 MG tablet Commonly known as:  DESYREL Take 50 mg by mouth at bedtime.       Review of Systems   This is limited secondary to dementia.  Provided by his wife as well.  General no complaints of fever chills he is lost a small amount of weight but apparently his appetite has improved recently.  Skin is not complain of rashes or itching.  Head ears eyes nose mouth and throat does not complain of visual changes or sore throat.  Respiratory no complaints of shortness of breath or cough.  Cardiac does not complain of chest pain or edema.  GI does have a history of C. difficile with diarrhea but according to his wife stools are gradually becoming a bit more solid he is not complaining of abdominal pain today.  GU is not complaining of dysuria.  Musculoskeletal is not complaining of joint pain currently apparently per his wife he has a high pain threshold.  Neurologic does not complain of dizziness headache numbness appears to have some left-sided weakness per his wife this is chronic.  Psych appears to be in good spirits pleasant smiling does have a history of depression anxiety  Immunization History  Administered Date(s) Administered  . Influenza, High Dose Seasonal PF 03/21/2015, 08/19/2016  . Influenza-Unspecified 04/24/2018  . Pneumococcal  Conjugate-13 10/03/2014  . Rotavirus Pentavalent 03/22/1998   Pertinent  Health Maintenance Due  Topic Date Due  . FOOT EXAM  09/24/2018 (Originally 01/20/1956)  . OPHTHALMOLOGY EXAM  09/24/2018 (Originally 01/20/1956)  . URINE MICROALBUMIN  09/24/2018 (Originally 01/20/1956)  . COLONOSCOPY  09/24/2018 (Originally 01/20/1998)  . PNA vac Low Risk Adult (2 of 2 - PPSV23) 09/24/2018 (Originally 10/03/2015)  .  HEMOGLOBIN A1C  01/27/2019  . INFLUENZA VACCINE  Completed   Fall Risk  04/23/2018  Falls in the past year? No   Functional Status Survey:    Temp-97.5   pulse is 84 respirations of 18 blood pressure 128/62- weight is 172.5 pounds update weight is pending.    Physical Exam In general this is a pleasant elderly male in no distress he is smiling and cooperative.  His skin is warm and dry he does have alopecia.  Eyes visual acuity appears grossly intact he has slight left eyelid proptosis.  Oropharynx he is edentulous mucous membranes are moist oropharynx is clear.  Chest is clear to auscultation with somewhat shallow air entry there is no labored breathing.  Heart is regular rate and rhythm without murmur gallop or rub he does not have significant lower extremity edema.  Abdomen is soft nontender with active bowel sounds.  Musculoskeletal does have weakness especially lower extremities left upper extremity appears somewhat more weak than the right--limited exam secondary patient had some difficulty obeying commands to raise his arms or move his limbs.  Neurologic cannot really appreciate overt lateralizing findings he does have some left-sided upper extremity weakness compared to the right according to his wife this is not new.  Psych he is oriented self he is pleasant and appropriate follows most commands with prompting Labs reviewed: Recent Labs    12/27/17 0445 12/27/17 0908 12/28/17 0450 12/29/17 0506 12/29/17 0619  05/20/18 05/26/18 07/06/18 08/17/18 1601  NA 149*  146* 139 141  --    < > 153* 144 138  --   K 3.0* 3.1* 4.0 3.4*  --    < > 4.2 4.1 4.4  --   CL 115* 112* 104 106  --   --   --   --   --   --   CO2 27 27 28 27   --   --   --   --   --   --   GLUCOSE 158* 157* 261* 151*  --   --   --   --   --   --   BUN 25* 22 15 10   --    < > 9 12 16   --   CREATININE 0.55* 0.55* 0.56* 0.44*  --    < > 0.6 0.5* 0.5* 0.70  CALCIUM 7.8* 7.6* 7.8* 7.8*  --   --   --   --   --   --   MG 1.8  --  1.7 2.1  --   --   --   --   --   --   PHOS 1.4*  --  1.6*  --  3.0  --   --   --   --   --    < > = values in this interval not displayed.   Recent Labs    12/26/17 1040  AST 14*  ALT 18  ALKPHOS 65  BILITOT 1.4*  PROT 8.4*  ALBUMIN 3.9   Recent Labs    12/26/17 1040 12/27/17 0445 12/28/17 0450 12/29/17 0506 04/16/18  WBC 10.8* 11.8* 10.5 7.0 7.6  NEUTROABS 8.6*  --   --   --  4  HGB 12.2* 10.1* 10.6* 10.3* 11.8*  HCT 39.0 32.5* 33.9* 32.6* 35*  MCV 92.4 92.1 90.9 88.8  --   PLT 361 331 309 277 254   No results found for: TSH Lab Results  Component Value Date   HGBA1C 7.7 07/29/2018  Lab Results  Component Value Date   CHOL 91 08/04/2018   HDL 37 08/04/2018   LDLCALC 15 08/04/2018   TRIG 197 (A) 08/04/2018    Significant Diagnostic Results in last 30 days:  Ct Abdomen Pelvis W Contrast  Result Date: 08/18/2018 CLINICAL DATA:  Lower abdominal pain and diarrhea. EXAM: CT ABDOMEN AND PELVIS WITH CONTRAST TECHNIQUE: Multidetector CT imaging of the abdomen and pelvis was performed using the standard protocol following bolus administration of intravenous contrast. CONTRAST:  OMNIPAQUE IOHEXOL 300 MG/ML  SOLN COMPARISON:  CT abdomen 11/25/2017 FINDINGS: Despite efforts by the technologist and patient, motion artifact is present on today's exam and could not be eliminated. This reduces exam sensitivity and specificity. Lower chest: Peripheral interstitial accentuation in the lung bases, especially in the posterior basal segments of the lower  lobes, likely from fibrosis given the roughly similar appearance on the prior exam. There is likely a component abut the shin ule dependent atelectasis on today's exam. Mild thickening of the distal esophageal wall. Coronary and aortic atherosclerotic vascular calcification. Lower left axillary node 0.7 cm in short axis. Hepatobiliary: Mildly contracted gallbladder is obscured by motion artifact. No focal liver lesion is identified. Pancreas: Unremarkable Spleen: Unremarkable Adrenals/Urinary Tract: 5 mm hypodense lesion of the right mid kidney is technically too small to characterize. Adrenal glands unremarkable. No significant additional renal parenchymal lesions. Small amount of gas in the urinary bladder, query recent catheterization. There is also a trace amount of gas along portions of the left urinary bladder wall for example on image 88/3. Stomach/Bowel: Prominence of frothy stool in the rectum with mild rectal wall thickening, stercoral colitis is not readily excluded. Scattered sigmoid colon diverticula without active diverticulitis. Air-levels in the descending colon compatible with diarrheal process. There is some fat deposition in the submucosa of the colon, which can have a weak association with inflammatory bowel disease. The appendix is filled with contrast and gas and does not appear particularly inflamed. Terminal ileum unremarkable. There is gas along the periphery of the cecum and ascending colon, but we do not demonstrate gas in the wall of the colon above the air-fluid levels, which typically indicates that the marginal gas is due to pseudo pneumatosis intestinalis rather than true pneumatosis. There is no appreciable portal venous gas. Vascular/Lymphatic: Aortoiliac atherosclerotic vascular disease. Mural thrombus in the abdominal aorta. Stranding around the right common femoral artery and vein, query recent catheterization. Scattered small and potentially reactive lymph nodes in the right  inguinal region. Reproductive: The prostate gland measures 5.0 by 3.8 by 4.3 cm (volume = 43 cm^3). Other: No supplemental non-categorized findings. Musculoskeletal: Mild compression fracture at L1 with prior vertebral augmentation. Degenerative disc disease at L5-S1 with mild right foraminal stenosis primarily from spurring. IMPRESSION: 1. Air-fluid levels in the descending colon compatible with diarrheal process. There is prominence of frothy stool material in the rectum with mild wall thickening which could be from low-grade stercoral colitis. 2. Peripheral gas in the right colon does not extend above the air-fluid levels along the colon wall, and based on this probably represents pseudo-pneumatosis intestinalis rather than true pneumatosis intestinalis. There is significant atherosclerosis but the celiac trunk, SMA, and IMA appear patent. 3. In addition a gas within the urinary bladder, there is a small amount of gas within or along the left posterior bladder wall, query mild emphysematous cystitis versus recent instrumentation with gas introduction into the bladder wall. 4. Mild peripheral fibrosis at the lung bases. 5.  Aortic Atherosclerosis (ICD10-I70.0).  Coronary atherosclerosis. 6. Upper normal prostate size. 7. Mildly thickened distal esophagus. This is nonspecific but the most common cause would be esophagitis. Electronically Signed   By: Gaylyn Rong M.D.   On: 08/18/2018 08:27    Assessment/Plan  #1- history of recurrent C. difficile- he is followed closely by GI he is completing yet another course of vancomycin which should end apparently on March 4 apparently has follow-up with GI on March 5--apparently GI has discussed possibly a fecal transplant if C. difficile persists-again this will be followed by GI it appears later this week.  Apparently his bowel movements are getting a bit more solid and he is not complaining of abdominal pain today.  2.  History of diabetes this appears  under relatively decent control on Lantus 30 units a day as well as Glucophage thousand milligrams twice daily and NovoLog at lunch and dinner-hemoglobin A1c of 7.7 done last month appears to be satisfactory.  3.  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  He is on trazodone at night to help him sleep.  Apparently he has tolerated these medications well.  4.  History of CVA again he does appear to have some mild left-sided weakness per his wife this is chronic he continues on aspirin as well as a statin LDL was only 15 on recent lab total cholesterol 91.  5.  History of coronary artery disease again this appears to be relatively asymptomatic currently he is on aspirin and a statin as noted above.  6.-  History of weight loss-he is on supplements including Magic cup and Eldertonic according to his wife and nursing staff he is eating quite a bit better now-I suspect with the C. difficile improving this is helping as well-at this point will monitor  #7- history of hypokalemia he is on low-dose potassium supplementation with history of diarrhea again would put him at risk of hypokalemia but last potassium showed stability at 4.4 will have this updated to ensure stability.  8.  History of osteoporosis he continues on Fosamax.  9.  History of chronic low back pain- per his wife he has a high pain tolerance he does have an order for PRN Tylenol and apparently this does help.  Also for wrist and elbow discomfort he has an Aspercreme order.  Again will update a metabolic panel to keep an eye on his renal function and potassium and also will update a CBC with differential for updated values he does appear to have some history of anemia with a hemoglobin of 11.8 on lab done on October 2019 will update  this  [id number]-of note greater than 40 minutes spent assessing patient-reviewing his chart and labs- coordinating and formulating a plan of care for numerous diagnoses- of note greater than 50% of time spent coordinating a plan of care with input as noted above

## 2018-08-25 ENCOUNTER — Encounter: Payer: Self-pay | Admitting: Internal Medicine

## 2018-08-26 DIAGNOSIS — Z8619 Personal history of other infectious and parasitic diseases: Secondary | ICD-10-CM | POA: Diagnosis not present

## 2018-08-26 DIAGNOSIS — R935 Abnormal findings on diagnostic imaging of other abdominal regions, including retroperitoneum: Secondary | ICD-10-CM | POA: Diagnosis not present

## 2018-08-31 ENCOUNTER — Encounter: Payer: Self-pay | Admitting: Internal Medicine

## 2018-08-31 ENCOUNTER — Non-Acute Institutional Stay (SKILLED_NURSING_FACILITY): Payer: Medicare Other | Admitting: Internal Medicine

## 2018-08-31 DIAGNOSIS — M810 Age-related osteoporosis without current pathological fracture: Secondary | ICD-10-CM | POA: Diagnosis not present

## 2018-08-31 DIAGNOSIS — R338 Other retention of urine: Secondary | ICD-10-CM

## 2018-08-31 DIAGNOSIS — A0472 Enterocolitis due to Clostridium difficile, not specified as recurrent: Secondary | ICD-10-CM | POA: Diagnosis not present

## 2018-08-31 DIAGNOSIS — R4189 Other symptoms and signs involving cognitive functions and awareness: Secondary | ICD-10-CM | POA: Diagnosis not present

## 2018-08-31 DIAGNOSIS — E1110 Type 2 diabetes mellitus with ketoacidosis without coma: Secondary | ICD-10-CM | POA: Diagnosis not present

## 2018-08-31 DIAGNOSIS — R29818 Other symptoms and signs involving the nervous system: Secondary | ICD-10-CM

## 2018-08-31 DIAGNOSIS — Z794 Long term (current) use of insulin: Secondary | ICD-10-CM | POA: Diagnosis not present

## 2018-08-31 NOTE — Progress Notes (Signed)
Location:    [facility] Room Number: 219/A Place of Service:  SNF [phone]  Provider: Edmon CrapeArlo Kamauri Kathol PA-C  PCP: Pecola LawlessHopper, William F, MD Patient Care Team: Pecola LawlessHopper, William F, MD as PCP - General (Internal Medicine) Gillis SantaMedina-Vargas, Monina C, NP as Nurse Practitioner (Internal Medicine)  Extended Emergency Contact Information Primary Emergency Contact: Arita MissBennett,Carrie  [place] Home Phone: [phone] Mobile Phone: [phone] Relation: Spouse Secondary Emergency Contact: Lanae CrumblyStafford, Sheila Mobile Phone: [phone] Relation: Daughter  Code Status: DNR Goals of care:  Advanced Directive information Advanced Directives 08/31/2018  Does Patient Have a Medical Advance Directive? Yes  Type of Advance Directive Out of facility DNR (pink MOST or yellow form)  Does patient want to make changes to medical advance directive? No - Patient declined  Copy of Healthcare Power of Attorney in Chart? No - copy requested  Would patient like information on creating a medical advance directive? -  Pre-existing out of facility DNR order (yellow form or pink MOST form) -     Allergies  Allergen Reactions  . Penicillins Rash    Has patient had a PCN reaction causing immediate rash, facial/tongue/throat swelling, SOB or lightheadedness with hypotension: Yes Has patient had a PCN reaction causing severe rash involving mucus membranes or skin necrosis: No Has patient had a PCN reaction that required hospitalization No Has patient had a PCN reaction occurring within the last 10 years: No If all of the above answers are "NO", then may proceed with Cephalosporin use.     Chief Complaint  Patient presents with  . Discharge Note    Discharge Visit    HPI:  73 y.o. male seen today for discharge from facility-he will actually be going to another facility which is closer to where he lives.  He has been here for rehab with a history of recurrent C.  difficile- has completed a prolonged course of vancomycin and has seen GI recently and thought to be doing well his diarrhea has moderated and now appears to be more well formed.  Vital signs appear to be stable he is lying in bed comfortably preparing to leave the facility.  He also has a history of type 2 diabetes is on Glucophage thousand milligrams twice daily as well as Lantus 30 units a day.  He also receives NovoLog with lunch and dinner  Blood sugars in the morning appear to range from the 80s to lower 100s generally later in the day runs in the 800s occasionally above 200 but does not appear to be consistently above this-since he is about to leave facility will defer any aggressive changes to his primary care provider.  At one point he had lost about 5 to 6 pounds but appears he is eating better I suspect dealing with the C. difficile contributed to this.  Does have history of dementia with depression as well as alcohol abuse in the past he has done well with supportive care here nursing is not reported any issues he is on Zoloft he is also on trazodone for insomnia and has Ativan twice daily for anxiety.  Regards coronary artery disease he does have a history of CVA as well-he is on aspirin as well as a statin continues with some left-sided weakness according his wife this is been chronic since his childhood.      Past Medical History:  Diagnosis Date  . Anemia   . Arthritis   . Dementia (HCC)   . DM (  diabetes mellitus), type 2 with peripheral vascular complications (HCC)   . ETOH abuse   . Myocardial infarction (HCC)   . Peripheral arterial disease (HCC)   . Stroke [facility])     Past Surgical History:  Procedure Laterality Date  . ABDOMINAL AORTOGRAM N/A 10/25/2016   Procedure: Abdominal Aortogram;  Surgeon: Sherren Kerns, MD;  Location: [facility] INVASIVE CV LAB;  Service: Cardiovascular;  Laterality: N/A;  . BRAIN SURGERY    . COLONOSCOPY  2015   Negative, performed by GI in  [place]  . CORONARY STENT PLACEMENT     " about 25 years ago (10/29/16)  . ENDARTERECTOMY FEMORAL Right 10/30/2016   Procedure: Eduardo Osier WITH PATCH GRAFT;  Surgeon: Sherren Kerns, MD;  Location: [facility] OR;  Service: Vascular;  Laterality: Right;  . IR NEPHRO TUBE REMOV/FL  07/04/2017  . IR NEPHRO TUBE REMOV/FL  07/07/2017  . IR NEPHROSTOMY EXCHANGE LEFT  06/20/2017  . IR NEPHROSTOMY EXCHANGE RIGHT  06/20/2017  . IR NEPHROSTOMY PLACEMENT LEFT  06/12/2017  . IR NEPHROSTOMY PLACEMENT RIGHT  06/12/2017  . IR URETERAL STENT PLACEMENT EXISTING ACCESS LEFT  06/20/2017  . IR URETERAL STENT PLACEMENT EXISTING ACCESS RIGHT  06/20/2017  . LOWER EXTREMITY ANGIOGRAPHY Right 10/25/2016   Procedure: Lower Extremity Angiography;  Surgeon: Sherren Kerns, MD;  Location: [facility] INVASIVE CV LAB;  Service: Cardiovascular;  Laterality: Right;      reports that he has quit smoking. His smoking use included cigarettes. He has never used smokeless tobacco. He reports that he does not drink alcohol or use drugs. Social History   Socioeconomic History  . Marital status: Married    Spouse name: Not on file  . Number of children: Not on file  . Years of education: Not on file  . Highest education level: Not on file  Occupational History  . Not on file  Social Needs  . Financial resource strain: Not hard at all  . Food insecurity:    Worry: Never true    Inability: Never true  . Transportation needs:    Medical: No    Non-medical: No  Tobacco Use  . Smoking status: Former Smoker    Types: Cigarettes  . Smokeless tobacco: Never Used  . Tobacco comment: Quit smoking cigarettes 10/ 2017  Substance and Sexual Activity  . Alcohol use: No    Comment: Alcohol abuse PMH  . Drug use: No  . Sexual activity: Not on file  Lifestyle  . Physical activity:    Days per week: 0 days    Minutes per session: 0 min  . Stress: Not at all  Relationships  . Social connections:    Talks on phone:  Never    Gets together: Twice a week    Attends religious service: Never    Active member of club or organization: No    Attends meetings of clubs or organizations: Never    Relationship status: Married  . Intimate partner violence:    Fear of current or ex partner: No    Emotionally abused: No    Physically abused: No    Forced sexual activity: No  Other Topics Concern  . Not on file  Social History Narrative  . Not on file   Functional Status Survey:    Allergies  Allergen Reactions  . Penicillins Rash    Has patient had a PCN reaction causing immediate rash, facial/tongue/throat swelling, SOB or lightheadedness with hypotension: Yes Has patient had a PCN reaction causing  severe rash involving mucus membranes or skin necrosis: No Has patient had a PCN reaction that required hospitalization No Has patient had a PCN reaction occurring within the last 10 years: No If all of the above answers are "NO", then may proceed with Cephalosporin use.     Pertinent  Health Maintenance Due  Topic Date Due  . FOOT EXAM  09/24/2018 (Originally 01/20/1956)  . OPHTHALMOLOGY EXAM  09/24/2018 (Originally 01/20/1956)  . URINE MICROALBUMIN  09/24/2018 (Originally 01/20/1956)  . COLONOSCOPY  09/24/2018 (Originally 01/20/1998)  . PNA vac Low Risk Adult (2 of 2 - PPSV23) 09/24/2018 (Originally 10/03/2015)  . HEMOGLOBIN A1C  01/27/2019  . INFLUENZA VACCINE  Completed    Medications: Allergies as of 08/31/2018      Reactions   Penicillins Rash   Has patient had a PCN reaction causing immediate rash, facial/tongue/throat swelling, SOB or lightheadedness with hypotension: Yes Has patient had a PCN reaction causing severe rash involving mucus membranes or skin necrosis: No Has patient had a PCN reaction that required hospitalization No Has patient had a PCN reaction occurring within the last 10 years: No If all of the above answers are "NO", then may proceed with Cephalosporin use.      Medication  List       Accurate as of August 31, 2018  4:34 PM. Always use your most recent med list.        acetaminophen 500 MG tablet Commonly known as:  TYLENOL Take 500 mg by mouth every 6 (six) hours as needed for mild pain.   alendronate 70 MG tablet Commonly known as:  FOSAMAX Take 70 mg by mouth once a week. Take with a full glass of water on an empty stomach.   ASPERCREME LIDOCAINE EX Apply 1 application topically See admin instructions. Apply to lower back pain BID, apply to elbow and wrist TID.   aspirin EC 81 MG tablet Take 81 mg by mouth every morning.   atorvastatin 10 MG tablet Commonly known as:  LIPITOR Take 10 mg by mouth at bedtime.   dicyclomine 20 MG tablet Commonly known as:  BENTYL Take 20 mg by mouth daily. For 2 weeks   eucerin cream Apply topically as needed for dry skin.   fluticasone 50 MCG/ACT nasal spray Commonly known as:  FLONASE Place 2 sprays into both nostrils daily.   geriatric multivitamins-minerals Liqd Take 15 mLs by mouth 2 (two) times daily.   insulin glargine 100 UNIT/ML injection Commonly known as:  LANTUS Inject 30 Units into the skin at bedtime.   LORazepam 1 MG tablet Commonly known as:  ATIVAN Take 0.5 tablets (0.5 mg total) by mouth 2 (two) times daily.   Melatonin 10 MG Tabs Take 10 mg by mouth at bedtime.   metFORMIN 1000 MG tablet Commonly known as:  GLUCOPHAGE Take 1,000 mg by mouth 2 (two) times daily with a meal.   NovoLOG 100 UNIT/ML injection Generic drug:  insulin aspart Inject 10 Units into the skin 2 (two) times daily. Before lunch and dinner.   NUTRITIONAL SUPPLEMENT PO Take 1 each by mouth 3 (three) times daily. Magic Cup   potassium chloride SA 20 MEQ tablet Commonly known as:  K-DUR,KLOR-CON Take 20 mEq by mouth daily.   QC Vitamin D3 50 MCG (2000 UT) Tabs Generic drug:  Cholecalciferol Take 1 tablet by mouth daily.   sertraline 50 MG tablet Commonly known as:  ZOLOFT Take 50 mg by mouth 2 (two)  times daily.   tamsulosin  0.4 MG Caps capsule Commonly known as:  FLOMAX Take 0.8 mg by mouth every evening. Take 2 capsules to = 0.8 mg   traZODone 50 MG tablet Commonly known as:  DESYREL Take 50 mg by mouth at bedtime.       Review of Systems   This is limited secondary to dementia provided by his wife as well.  In general there is no complaint of fever chills.  Skin does not complain of diaphoresis or itching.  Head ears eyes nose mouth and throat does not complain of visual changes or sore throat.  Respiratory is not complaining of shortness of breath or cough.  Cardiac does not complain of chest pain or significant lower extremity edema.  GI again diarrhea has improved as noted above now he does have some formation to his stool is not complaining of abdominal pain nausea vomiting or constipation.  GU is not complaining of dysuria.  Musculoskeletal does not complain of joint pain at this time does have weakness more so on his left side.  Neurologic as noted above no complaints of syncope or dizziness or headache.  Psych again does have a history of some dementia depression and anxiety but this appears well controlled  Temperature is 97.9 pulse 78 respirations 20 blood pressure 133/74 O2 saturation is 98% on room air  Physical Exam General this is a pleasant elderly male in no distress lying comfortably in bed.  His skin is warm and dry.  Eyes visual acuity appears intact.  Oropharynx he is edentulous mucous membranes continue to be moist.  Chest is clear to auscultation there is no labored breathing.  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  Abdomen is soft nontender with positive bowel sounds.  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  Neurologic appears grossly intact his speech is clear he does not speak much however.  Psych he is oriented to self pleasant and appropriate Labs reviewed: Basic Metabolic Panel: Recent Labs    12/27/17 0445 12/27/17 0908 12/28/17 0450 12/29/17 0506 12/29/17 0619  05/20/18 05/26/18 07/06/18 08/17/18 1601  NA 149* 146* 139 141  --    < > 153* 144 138  --   K 3.0* 3.1* 4.0 3.4*  --    < > 4.2 4.1 4.4  --   CL 115* 112* 104 106  --   --   --   --   --   --   CO2 --   --   --   --   --   --   GLUCOSE 158* 157* 261* 151*  --   --   --   --   --   --   BUN 25* --    < > --   CREATININE 0.55* 0.55* 0.56* 0.44*  --    < > 0.6 0.5* 0.5* 0.70  CALCIUM 7.8* 7.6* 7.8* 7.8*  --   --   --   --   --   --   MG 1.8  --  1.7 2.1  --   --   --   --   --   --  PHOS 1.4*  --  1.6*  --  3.0  --   --   --   --   --    < > = values in this interval not displayed.   Liver Function Tests: Recent Labs    12/26/17 1040  AST 14*  ALT 18  ALKPHOS 65  BILITOT 1.4*  PROT 8.4*  ALBUMIN 3.9   No results for input(s): LIPASE, AMYLASE in the last 8760 hours. No results for input(s): AMMONIA in the last 8760 hours. CBC: Recent Labs    12/26/17 1040 12/27/17 0445 12/28/17 0450 12/29/17 0506 04/16/18  WBC 10.8* 11.8* 10.5 7.0 7.6  NEUTROABS 8.6*  --   --   --  4  HGB 12.2* 10.1* 10.6* 10.3* 11.8*  HCT 39.0 32.5* 33.9* 32.6* 35*  MCV 92.4 92.1 90.9 88.8  --   PLT 361 331 309 277 254   Cardiac Enzymes: No results for input(s): CKTOTAL, CKMB, CKMBINDEX, TROPONINI in the last 8760 hours. BNP: Invalid input(s): POCBNP CBG: Recent Labs    12/29/17 0716 12/29/17 1105 08/17/18 1558  GLUCAP 166* 276* 95    Procedures and Imaging Studies During Stay: Ct Abdomen Pelvis W Contrast  Result Date: 08/18/2018 CLINICAL DATA:  Lower abdominal pain and diarrhea. EXAM: CT ABDOMEN AND PELVIS WITH CONTRAST TECHNIQUE:  Multidetector CT imaging of the abdomen and pelvis was performed using the standard protocol following bolus administration of intravenous contrast. CONTRAST:  OMNIPAQUE IOHEXOL 300 MG/ML  SOLN COMPARISON:  CT abdomen 11/25/2017 FINDINGS: Despite efforts by the technologist and patient, motion artifact is present on today's exam and could not be eliminated. This reduces exam sensitivity and specificity. Lower chest: Peripheral interstitial accentuation in the lung bases, especially in the posterior basal segments of the lower lobes, likely from fibrosis given the roughly similar appearance on the prior exam. There is likely a component abut the shin ule dependent atelectasis on today's exam. Mild thickening of the distal esophageal wall. Coronary and aortic atherosclerotic vascular calcification. Lower left axillary node 0.7 cm in short axis. Hepatobiliary: Mildly contracted gallbladder is obscured by motion artifact. No focal liver lesion is identified. Pancreas: Unremarkable Spleen: Unremarkable Adrenals/Urinary Tract: 5 mm hypodense lesion of the right mid kidney is technically too small to characterize. Adrenal glands unremarkable. No significant additional renal parenchymal lesions. Small amount of gas in the urinary bladder, query recent catheterization. There is also a trace amount of gas along portions of the left urinary bladder wall for example on image 88/3. Stomach/Bowel: Prominence of frothy stool in the rectum with mild rectal wall thickening, stercoral colitis is not readily excluded. Scattered sigmoid colon diverticula without active diverticulitis. Air-levels in the descending colon compatible with diarrheal process. There is some fat deposition in the submucosa of the colon, which can have a weak association with inflammatory bowel disease. The appendix is filled with contrast and gas and does not appear particularly inflamed. Terminal ileum unremarkable. There is gas along the periphery of  the cecum and ascending colon, but we do not demonstrate gas in the wall of the colon above the air-fluid levels, which typically indicates that the marginal gas is due to pseudo pneumatosis intestinalis rather than true pneumatosis. There is no appreciable portal venous gas. Vascular/Lymphatic: Aortoiliac atherosclerotic vascular disease. Mural thrombus in the abdominal aorta. Stranding around the right common femoral artery and vein, query recent catheterization. Scattered small and potentially reactive lymph nodes in the right inguinal region. Reproductive: The prostate gland measures 5.0 by 3.8  by 4.3 cm (volume = 43 cm^3). Other: No supplemental non-categorized findings. Musculoskeletal: Mild compression fracture at L1 with prior vertebral augmentation. Degenerative disc disease at L5-S1 with mild right foraminal stenosis primarily from spurring. IMPRESSION: 1. Air-fluid levels in the descending colon compatible with diarrheal process. There is prominence of frothy stool material in the rectum with mild wall thickening which could be from low-grade stercoral colitis. 2. Peripheral gas in the right colon does not extend above the air-fluid levels along the colon wall, and based on this probably represents pseudo-pneumatosis intestinalis rather than true pneumatosis intestinalis. There is significant atherosclerosis but the celiac trunk, SMA, and IMA appear patent. 3. In addition a gas within the urinary bladder, there is a small amount of gas within or along the left posterior bladder wall, query mild emphysematous cystitis versus recent instrumentation with gas introduction into the bladder wall. 4. Mild peripheral fibrosis at the lung bases. 5.  Aortic Atherosclerosis (ICD10-I70.0).  Coronary atherosclerosis. 6. Upper normal prostate size. 7. Mildly thickened distal esophagus. This is nonspecific but the most common cause would be esophagitis. Electronically Signed   By: Gaylyn Rong M.D.   On:  08/18/2018 08:27    Assessment/Plan:    #1 history of C. difficile he is followed closely by GI and appears his been started on Bentyl for apparently a 2-week course-the diarrhea apparently has improved significantly he is completed an extended course of vancomycin.  There is consideration apparently for fecal transplant if C. difficile consistently reoccurs.  This will have follow-up by GI as well his primary care provider.  2.  History of type 2 diabetes appears under fairly decent control on Lantus 30 units a day and Glucophage thousand milligrams twice daiy-and NovoLog twice daily with lunch and dinner-hemoglobin A1c was 7.7 last month which is in acceptable range considering his age and comorbidities.  3.-History of dementia with history of CVA and neurocognitive deficits- he is done well with supportive measures here he has an order for Ativan as well as Zoloft for depression.  He continues on trazodone for insomnia.  4.-History of CVA this appears stable he does ambulate in a wheelchair he does have a very supportive wife.  He is on aspirin as well as a statin-.  5.-  #5-history of coronary artery disease again he is on an aspirin as well as statin.  6.  History of weight loss he is on supplements includes Magic cup and Eldertonic apparently he has been eating better I suspect he will gain weight but this will need follow-up at the new facility.  7.  History of hypokalemia in the past he continues on low-dose potassium-his lab will need to be updated I did speak with the DON about contacting new facility to draw this so we can ensure stability of his electrolytes  #8 history of osteoporosis he continues on Fosamax.  9.  History of chronic back pain- he does have PRN Tylenol and this appears to be effective speaking with his wife recently apparently has a fairly high pain tolerance  #10 suspected history of allergic rhinitis he does have an order for Flonase.   #11- history of  urinary retention continues on Flomax .  [id number] note greater than 30 minutes spent on this discharge summary- greater than 50% of time spent coordinating a plan of care for numerous diagnoses

## 2018-09-01 ENCOUNTER — Encounter: Payer: Self-pay | Admitting: Internal Medicine

## 2018-09-01 DIAGNOSIS — M6281 Muscle weakness (generalized): Secondary | ICD-10-CM | POA: Diagnosis not present

## 2018-09-01 DIAGNOSIS — R1312 Dysphagia, oropharyngeal phase: Secondary | ICD-10-CM | POA: Diagnosis not present

## 2018-09-01 DIAGNOSIS — Z89512 Acquired absence of left leg below knee: Secondary | ICD-10-CM | POA: Diagnosis not present

## 2018-09-01 DIAGNOSIS — E785 Hyperlipidemia, unspecified: Secondary | ICD-10-CM | POA: Diagnosis not present

## 2018-09-01 DIAGNOSIS — M810 Age-related osteoporosis without current pathological fracture: Secondary | ICD-10-CM | POA: Diagnosis not present

## 2018-09-01 DIAGNOSIS — G4700 Insomnia, unspecified: Secondary | ICD-10-CM | POA: Diagnosis not present

## 2018-09-01 DIAGNOSIS — E1142 Type 2 diabetes mellitus with diabetic polyneuropathy: Secondary | ICD-10-CM | POA: Diagnosis not present

## 2018-09-02 DIAGNOSIS — E1142 Type 2 diabetes mellitus with diabetic polyneuropathy: Secondary | ICD-10-CM | POA: Diagnosis not present

## 2018-09-02 DIAGNOSIS — M6281 Muscle weakness (generalized): Secondary | ICD-10-CM | POA: Diagnosis not present

## 2018-09-02 DIAGNOSIS — Z89512 Acquired absence of left leg below knee: Secondary | ICD-10-CM | POA: Diagnosis not present

## 2018-09-02 DIAGNOSIS — I739 Peripheral vascular disease, unspecified: Secondary | ICD-10-CM | POA: Diagnosis not present

## 2018-09-02 DIAGNOSIS — D649 Anemia, unspecified: Secondary | ICD-10-CM | POA: Diagnosis not present

## 2018-09-02 DIAGNOSIS — E119 Type 2 diabetes mellitus without complications: Secondary | ICD-10-CM | POA: Diagnosis not present

## 2018-09-02 DIAGNOSIS — R1312 Dysphagia, oropharyngeal phase: Secondary | ICD-10-CM | POA: Diagnosis not present

## 2018-09-02 DIAGNOSIS — I2510 Atherosclerotic heart disease of native coronary artery without angina pectoris: Secondary | ICD-10-CM | POA: Diagnosis not present

## 2018-09-02 DIAGNOSIS — Z7982 Long term (current) use of aspirin: Secondary | ICD-10-CM | POA: Diagnosis not present

## 2018-09-03 DIAGNOSIS — Z89512 Acquired absence of left leg below knee: Secondary | ICD-10-CM | POA: Diagnosis not present

## 2018-09-03 DIAGNOSIS — E1142 Type 2 diabetes mellitus with diabetic polyneuropathy: Secondary | ICD-10-CM | POA: Diagnosis not present

## 2018-09-03 DIAGNOSIS — M810 Age-related osteoporosis without current pathological fracture: Secondary | ICD-10-CM | POA: Diagnosis not present

## 2018-09-03 DIAGNOSIS — E785 Hyperlipidemia, unspecified: Secondary | ICD-10-CM | POA: Diagnosis not present

## 2018-09-03 DIAGNOSIS — M6281 Muscle weakness (generalized): Secondary | ICD-10-CM | POA: Diagnosis not present

## 2018-09-03 DIAGNOSIS — G4700 Insomnia, unspecified: Secondary | ICD-10-CM | POA: Diagnosis not present

## 2018-09-03 DIAGNOSIS — R1312 Dysphagia, oropharyngeal phase: Secondary | ICD-10-CM | POA: Diagnosis not present

## 2018-09-04 DIAGNOSIS — Z89512 Acquired absence of left leg below knee: Secondary | ICD-10-CM | POA: Diagnosis not present

## 2018-09-04 DIAGNOSIS — M6281 Muscle weakness (generalized): Secondary | ICD-10-CM | POA: Diagnosis not present

## 2018-09-04 DIAGNOSIS — R1312 Dysphagia, oropharyngeal phase: Secondary | ICD-10-CM | POA: Diagnosis not present

## 2018-09-04 DIAGNOSIS — E1142 Type 2 diabetes mellitus with diabetic polyneuropathy: Secondary | ICD-10-CM | POA: Diagnosis not present

## 2018-09-05 DIAGNOSIS — E1142 Type 2 diabetes mellitus with diabetic polyneuropathy: Secondary | ICD-10-CM | POA: Diagnosis not present

## 2018-09-05 DIAGNOSIS — Z89512 Acquired absence of left leg below knee: Secondary | ICD-10-CM | POA: Diagnosis not present

## 2018-09-05 DIAGNOSIS — R1312 Dysphagia, oropharyngeal phase: Secondary | ICD-10-CM | POA: Diagnosis not present

## 2018-09-05 DIAGNOSIS — M6281 Muscle weakness (generalized): Secondary | ICD-10-CM | POA: Diagnosis not present

## 2018-09-06 DIAGNOSIS — E1142 Type 2 diabetes mellitus with diabetic polyneuropathy: Secondary | ICD-10-CM | POA: Diagnosis not present

## 2018-09-06 DIAGNOSIS — Z89512 Acquired absence of left leg below knee: Secondary | ICD-10-CM | POA: Diagnosis not present

## 2018-09-06 DIAGNOSIS — M6281 Muscle weakness (generalized): Secondary | ICD-10-CM | POA: Diagnosis not present

## 2018-09-06 DIAGNOSIS — R1312 Dysphagia, oropharyngeal phase: Secondary | ICD-10-CM | POA: Diagnosis not present

## 2018-09-07 DIAGNOSIS — E1142 Type 2 diabetes mellitus with diabetic polyneuropathy: Secondary | ICD-10-CM | POA: Diagnosis not present

## 2018-09-07 DIAGNOSIS — Z89512 Acquired absence of left leg below knee: Secondary | ICD-10-CM | POA: Diagnosis not present

## 2018-09-07 DIAGNOSIS — M6281 Muscle weakness (generalized): Secondary | ICD-10-CM | POA: Diagnosis not present

## 2018-09-07 DIAGNOSIS — R1312 Dysphagia, oropharyngeal phase: Secondary | ICD-10-CM | POA: Diagnosis not present

## 2018-09-08 DIAGNOSIS — R1312 Dysphagia, oropharyngeal phase: Secondary | ICD-10-CM | POA: Diagnosis not present

## 2018-09-08 DIAGNOSIS — M6281 Muscle weakness (generalized): Secondary | ICD-10-CM | POA: Diagnosis not present

## 2018-09-08 DIAGNOSIS — Z89512 Acquired absence of left leg below knee: Secondary | ICD-10-CM | POA: Diagnosis not present

## 2018-09-08 DIAGNOSIS — G4700 Insomnia, unspecified: Secondary | ICD-10-CM | POA: Diagnosis not present

## 2018-09-08 DIAGNOSIS — E1142 Type 2 diabetes mellitus with diabetic polyneuropathy: Secondary | ICD-10-CM | POA: Diagnosis not present

## 2018-09-08 DIAGNOSIS — E785 Hyperlipidemia, unspecified: Secondary | ICD-10-CM | POA: Diagnosis not present

## 2018-09-08 DIAGNOSIS — M810 Age-related osteoporosis without current pathological fracture: Secondary | ICD-10-CM | POA: Diagnosis not present

## 2018-09-09 DIAGNOSIS — M6281 Muscle weakness (generalized): Secondary | ICD-10-CM | POA: Diagnosis not present

## 2018-09-09 DIAGNOSIS — R1312 Dysphagia, oropharyngeal phase: Secondary | ICD-10-CM | POA: Diagnosis not present

## 2018-09-09 DIAGNOSIS — Z89512 Acquired absence of left leg below knee: Secondary | ICD-10-CM | POA: Diagnosis not present

## 2018-09-09 DIAGNOSIS — E1142 Type 2 diabetes mellitus with diabetic polyneuropathy: Secondary | ICD-10-CM | POA: Diagnosis not present

## 2018-09-10 DIAGNOSIS — R1312 Dysphagia, oropharyngeal phase: Secondary | ICD-10-CM | POA: Diagnosis not present

## 2018-09-10 DIAGNOSIS — Z89512 Acquired absence of left leg below knee: Secondary | ICD-10-CM | POA: Diagnosis not present

## 2018-09-10 DIAGNOSIS — E1142 Type 2 diabetes mellitus with diabetic polyneuropathy: Secondary | ICD-10-CM | POA: Diagnosis not present

## 2018-09-10 DIAGNOSIS — M6281 Muscle weakness (generalized): Secondary | ICD-10-CM | POA: Diagnosis not present

## 2018-09-11 DIAGNOSIS — M6281 Muscle weakness (generalized): Secondary | ICD-10-CM | POA: Diagnosis not present

## 2018-09-11 DIAGNOSIS — R1312 Dysphagia, oropharyngeal phase: Secondary | ICD-10-CM | POA: Diagnosis not present

## 2018-09-11 DIAGNOSIS — Z89512 Acquired absence of left leg below knee: Secondary | ICD-10-CM | POA: Diagnosis not present

## 2018-09-11 DIAGNOSIS — E1142 Type 2 diabetes mellitus with diabetic polyneuropathy: Secondary | ICD-10-CM | POA: Diagnosis not present

## 2018-09-12 DIAGNOSIS — Z89512 Acquired absence of left leg below knee: Secondary | ICD-10-CM | POA: Diagnosis not present

## 2018-09-12 DIAGNOSIS — M6281 Muscle weakness (generalized): Secondary | ICD-10-CM | POA: Diagnosis not present

## 2018-09-12 DIAGNOSIS — E1142 Type 2 diabetes mellitus with diabetic polyneuropathy: Secondary | ICD-10-CM | POA: Diagnosis not present

## 2018-09-12 DIAGNOSIS — R1312 Dysphagia, oropharyngeal phase: Secondary | ICD-10-CM | POA: Diagnosis not present

## 2018-09-13 DIAGNOSIS — E1142 Type 2 diabetes mellitus with diabetic polyneuropathy: Secondary | ICD-10-CM | POA: Diagnosis not present

## 2018-09-13 DIAGNOSIS — M6281 Muscle weakness (generalized): Secondary | ICD-10-CM | POA: Diagnosis not present

## 2018-09-13 DIAGNOSIS — R1312 Dysphagia, oropharyngeal phase: Secondary | ICD-10-CM | POA: Diagnosis not present

## 2018-09-13 DIAGNOSIS — Z89512 Acquired absence of left leg below knee: Secondary | ICD-10-CM | POA: Diagnosis not present

## 2018-09-14 DIAGNOSIS — R1312 Dysphagia, oropharyngeal phase: Secondary | ICD-10-CM | POA: Diagnosis not present

## 2018-09-14 DIAGNOSIS — M6281 Muscle weakness (generalized): Secondary | ICD-10-CM | POA: Diagnosis not present

## 2018-09-14 DIAGNOSIS — Z89512 Acquired absence of left leg below knee: Secondary | ICD-10-CM | POA: Diagnosis not present

## 2018-09-14 DIAGNOSIS — E1142 Type 2 diabetes mellitus with diabetic polyneuropathy: Secondary | ICD-10-CM | POA: Diagnosis not present

## 2018-09-15 DIAGNOSIS — R1312 Dysphagia, oropharyngeal phase: Secondary | ICD-10-CM | POA: Diagnosis not present

## 2018-09-15 DIAGNOSIS — Z89512 Acquired absence of left leg below knee: Secondary | ICD-10-CM | POA: Diagnosis not present

## 2018-09-15 DIAGNOSIS — E1142 Type 2 diabetes mellitus with diabetic polyneuropathy: Secondary | ICD-10-CM | POA: Diagnosis not present

## 2018-09-15 DIAGNOSIS — M6281 Muscle weakness (generalized): Secondary | ICD-10-CM | POA: Diagnosis not present

## 2018-09-16 DIAGNOSIS — M6281 Muscle weakness (generalized): Secondary | ICD-10-CM | POA: Diagnosis not present

## 2018-09-16 DIAGNOSIS — E1142 Type 2 diabetes mellitus with diabetic polyneuropathy: Secondary | ICD-10-CM | POA: Diagnosis not present

## 2018-09-16 DIAGNOSIS — Z89512 Acquired absence of left leg below knee: Secondary | ICD-10-CM | POA: Diagnosis not present

## 2018-09-16 DIAGNOSIS — R1312 Dysphagia, oropharyngeal phase: Secondary | ICD-10-CM | POA: Diagnosis not present

## 2018-09-17 DIAGNOSIS — M6281 Muscle weakness (generalized): Secondary | ICD-10-CM | POA: Diagnosis not present

## 2018-09-17 DIAGNOSIS — Z89512 Acquired absence of left leg below knee: Secondary | ICD-10-CM | POA: Diagnosis not present

## 2018-09-17 DIAGNOSIS — E1142 Type 2 diabetes mellitus with diabetic polyneuropathy: Secondary | ICD-10-CM | POA: Diagnosis not present

## 2018-09-17 DIAGNOSIS — R1312 Dysphagia, oropharyngeal phase: Secondary | ICD-10-CM | POA: Diagnosis not present

## 2018-09-18 DIAGNOSIS — R1312 Dysphagia, oropharyngeal phase: Secondary | ICD-10-CM | POA: Diagnosis not present

## 2018-09-18 DIAGNOSIS — Z89512 Acquired absence of left leg below knee: Secondary | ICD-10-CM | POA: Diagnosis not present

## 2018-09-18 DIAGNOSIS — M6281 Muscle weakness (generalized): Secondary | ICD-10-CM | POA: Diagnosis not present

## 2018-09-18 DIAGNOSIS — L853 Xerosis cutis: Secondary | ICD-10-CM | POA: Diagnosis not present

## 2018-09-18 DIAGNOSIS — M810 Age-related osteoporosis without current pathological fracture: Secondary | ICD-10-CM | POA: Diagnosis not present

## 2018-09-18 DIAGNOSIS — E1142 Type 2 diabetes mellitus with diabetic polyneuropathy: Secondary | ICD-10-CM | POA: Diagnosis not present

## 2018-09-18 DIAGNOSIS — L299 Pruritus, unspecified: Secondary | ICD-10-CM | POA: Diagnosis not present

## 2018-09-19 DIAGNOSIS — E1142 Type 2 diabetes mellitus with diabetic polyneuropathy: Secondary | ICD-10-CM | POA: Diagnosis not present

## 2018-09-19 DIAGNOSIS — Z89512 Acquired absence of left leg below knee: Secondary | ICD-10-CM | POA: Diagnosis not present

## 2018-09-19 DIAGNOSIS — M6281 Muscle weakness (generalized): Secondary | ICD-10-CM | POA: Diagnosis not present

## 2018-09-19 DIAGNOSIS — R1312 Dysphagia, oropharyngeal phase: Secondary | ICD-10-CM | POA: Diagnosis not present

## 2018-09-21 DIAGNOSIS — E1142 Type 2 diabetes mellitus with diabetic polyneuropathy: Secondary | ICD-10-CM | POA: Diagnosis not present

## 2018-09-21 DIAGNOSIS — M6281 Muscle weakness (generalized): Secondary | ICD-10-CM | POA: Diagnosis not present

## 2018-09-21 DIAGNOSIS — Z89512 Acquired absence of left leg below knee: Secondary | ICD-10-CM | POA: Diagnosis not present

## 2018-09-21 DIAGNOSIS — R1312 Dysphagia, oropharyngeal phase: Secondary | ICD-10-CM | POA: Diagnosis not present

## 2018-09-22 DIAGNOSIS — E1142 Type 2 diabetes mellitus with diabetic polyneuropathy: Secondary | ICD-10-CM | POA: Diagnosis not present

## 2018-09-22 DIAGNOSIS — Z89512 Acquired absence of left leg below knee: Secondary | ICD-10-CM | POA: Diagnosis not present

## 2018-09-22 DIAGNOSIS — M6281 Muscle weakness (generalized): Secondary | ICD-10-CM | POA: Diagnosis not present

## 2018-09-22 DIAGNOSIS — R1312 Dysphagia, oropharyngeal phase: Secondary | ICD-10-CM | POA: Diagnosis not present

## 2018-09-23 DIAGNOSIS — Z89512 Acquired absence of left leg below knee: Secondary | ICD-10-CM | POA: Diagnosis not present

## 2018-09-23 DIAGNOSIS — R1312 Dysphagia, oropharyngeal phase: Secondary | ICD-10-CM | POA: Diagnosis not present

## 2018-09-23 DIAGNOSIS — M6281 Muscle weakness (generalized): Secondary | ICD-10-CM | POA: Diagnosis not present

## 2018-09-23 DIAGNOSIS — E1142 Type 2 diabetes mellitus with diabetic polyneuropathy: Secondary | ICD-10-CM | POA: Diagnosis not present

## 2018-09-24 DIAGNOSIS — M6281 Muscle weakness (generalized): Secondary | ICD-10-CM | POA: Diagnosis not present

## 2018-09-24 DIAGNOSIS — R1312 Dysphagia, oropharyngeal phase: Secondary | ICD-10-CM | POA: Diagnosis not present

## 2018-09-24 DIAGNOSIS — Z89512 Acquired absence of left leg below knee: Secondary | ICD-10-CM | POA: Diagnosis not present

## 2018-09-24 DIAGNOSIS — E1142 Type 2 diabetes mellitus with diabetic polyneuropathy: Secondary | ICD-10-CM | POA: Diagnosis not present

## 2018-09-25 DIAGNOSIS — M6281 Muscle weakness (generalized): Secondary | ICD-10-CM | POA: Diagnosis not present

## 2018-09-25 DIAGNOSIS — E1142 Type 2 diabetes mellitus with diabetic polyneuropathy: Secondary | ICD-10-CM | POA: Diagnosis not present

## 2018-09-25 DIAGNOSIS — Z89512 Acquired absence of left leg below knee: Secondary | ICD-10-CM | POA: Diagnosis not present

## 2018-09-25 DIAGNOSIS — R1312 Dysphagia, oropharyngeal phase: Secondary | ICD-10-CM | POA: Diagnosis not present

## 2018-09-26 DIAGNOSIS — R1312 Dysphagia, oropharyngeal phase: Secondary | ICD-10-CM | POA: Diagnosis not present

## 2018-09-26 DIAGNOSIS — E1142 Type 2 diabetes mellitus with diabetic polyneuropathy: Secondary | ICD-10-CM | POA: Diagnosis not present

## 2018-09-26 DIAGNOSIS — M6281 Muscle weakness (generalized): Secondary | ICD-10-CM | POA: Diagnosis not present

## 2018-09-26 DIAGNOSIS — Z89512 Acquired absence of left leg below knee: Secondary | ICD-10-CM | POA: Diagnosis not present

## 2018-09-27 DIAGNOSIS — E1142 Type 2 diabetes mellitus with diabetic polyneuropathy: Secondary | ICD-10-CM | POA: Diagnosis not present

## 2018-09-27 DIAGNOSIS — Z89512 Acquired absence of left leg below knee: Secondary | ICD-10-CM | POA: Diagnosis not present

## 2018-09-27 DIAGNOSIS — M6281 Muscle weakness (generalized): Secondary | ICD-10-CM | POA: Diagnosis not present

## 2018-09-27 DIAGNOSIS — R1312 Dysphagia, oropharyngeal phase: Secondary | ICD-10-CM | POA: Diagnosis not present

## 2018-09-28 DIAGNOSIS — R1312 Dysphagia, oropharyngeal phase: Secondary | ICD-10-CM | POA: Diagnosis not present

## 2018-09-28 DIAGNOSIS — Z89512 Acquired absence of left leg below knee: Secondary | ICD-10-CM | POA: Diagnosis not present

## 2018-09-28 DIAGNOSIS — M6281 Muscle weakness (generalized): Secondary | ICD-10-CM | POA: Diagnosis not present

## 2018-09-28 DIAGNOSIS — E1142 Type 2 diabetes mellitus with diabetic polyneuropathy: Secondary | ICD-10-CM | POA: Diagnosis not present

## 2018-09-29 DIAGNOSIS — E1142 Type 2 diabetes mellitus with diabetic polyneuropathy: Secondary | ICD-10-CM | POA: Diagnosis not present

## 2018-09-29 DIAGNOSIS — R1312 Dysphagia, oropharyngeal phase: Secondary | ICD-10-CM | POA: Diagnosis not present

## 2018-09-29 DIAGNOSIS — M6281 Muscle weakness (generalized): Secondary | ICD-10-CM | POA: Diagnosis not present

## 2018-09-29 DIAGNOSIS — Z89512 Acquired absence of left leg below knee: Secondary | ICD-10-CM | POA: Diagnosis not present

## 2018-09-29 DIAGNOSIS — M810 Age-related osteoporosis without current pathological fracture: Secondary | ICD-10-CM | POA: Diagnosis not present

## 2018-09-29 DIAGNOSIS — E785 Hyperlipidemia, unspecified: Secondary | ICD-10-CM | POA: Diagnosis not present

## 2018-09-29 DIAGNOSIS — L853 Xerosis cutis: Secondary | ICD-10-CM | POA: Diagnosis not present

## 2018-09-30 DIAGNOSIS — N390 Urinary tract infection, site not specified: Secondary | ICD-10-CM | POA: Diagnosis not present

## 2018-09-30 DIAGNOSIS — E1142 Type 2 diabetes mellitus with diabetic polyneuropathy: Secondary | ICD-10-CM | POA: Diagnosis not present

## 2018-09-30 DIAGNOSIS — Z89512 Acquired absence of left leg below knee: Secondary | ICD-10-CM | POA: Diagnosis not present

## 2018-09-30 DIAGNOSIS — R319 Hematuria, unspecified: Secondary | ICD-10-CM | POA: Diagnosis not present

## 2018-09-30 DIAGNOSIS — R1312 Dysphagia, oropharyngeal phase: Secondary | ICD-10-CM | POA: Diagnosis not present

## 2018-09-30 DIAGNOSIS — M6281 Muscle weakness (generalized): Secondary | ICD-10-CM | POA: Diagnosis not present

## 2018-10-01 DIAGNOSIS — R1312 Dysphagia, oropharyngeal phase: Secondary | ICD-10-CM | POA: Diagnosis not present

## 2018-10-01 DIAGNOSIS — M6281 Muscle weakness (generalized): Secondary | ICD-10-CM | POA: Diagnosis not present

## 2018-10-01 DIAGNOSIS — M810 Age-related osteoporosis without current pathological fracture: Secondary | ICD-10-CM | POA: Diagnosis not present

## 2018-10-01 DIAGNOSIS — E785 Hyperlipidemia, unspecified: Secondary | ICD-10-CM | POA: Diagnosis not present

## 2018-10-01 DIAGNOSIS — Z89512 Acquired absence of left leg below knee: Secondary | ICD-10-CM | POA: Diagnosis not present

## 2018-10-01 DIAGNOSIS — E1142 Type 2 diabetes mellitus with diabetic polyneuropathy: Secondary | ICD-10-CM | POA: Diagnosis not present

## 2018-10-01 DIAGNOSIS — L853 Xerosis cutis: Secondary | ICD-10-CM | POA: Diagnosis not present

## 2018-10-02 DIAGNOSIS — R1312 Dysphagia, oropharyngeal phase: Secondary | ICD-10-CM | POA: Diagnosis not present

## 2018-10-02 DIAGNOSIS — E1142 Type 2 diabetes mellitus with diabetic polyneuropathy: Secondary | ICD-10-CM | POA: Diagnosis not present

## 2018-10-02 DIAGNOSIS — Z89512 Acquired absence of left leg below knee: Secondary | ICD-10-CM | POA: Diagnosis not present

## 2018-10-02 DIAGNOSIS — M6281 Muscle weakness (generalized): Secondary | ICD-10-CM | POA: Diagnosis not present

## 2018-10-03 DIAGNOSIS — D649 Anemia, unspecified: Secondary | ICD-10-CM | POA: Diagnosis not present

## 2018-10-03 DIAGNOSIS — E1110 Type 2 diabetes mellitus with ketoacidosis without coma: Secondary | ICD-10-CM | POA: Diagnosis not present

## 2018-10-05 DIAGNOSIS — R1312 Dysphagia, oropharyngeal phase: Secondary | ICD-10-CM | POA: Diagnosis not present

## 2018-10-05 DIAGNOSIS — Z89512 Acquired absence of left leg below knee: Secondary | ICD-10-CM | POA: Diagnosis not present

## 2018-10-05 DIAGNOSIS — E1142 Type 2 diabetes mellitus with diabetic polyneuropathy: Secondary | ICD-10-CM | POA: Diagnosis not present

## 2018-10-05 DIAGNOSIS — M6281 Muscle weakness (generalized): Secondary | ICD-10-CM | POA: Diagnosis not present

## 2018-10-06 DIAGNOSIS — R1312 Dysphagia, oropharyngeal phase: Secondary | ICD-10-CM | POA: Diagnosis not present

## 2018-10-06 DIAGNOSIS — Z89512 Acquired absence of left leg below knee: Secondary | ICD-10-CM | POA: Diagnosis not present

## 2018-10-06 DIAGNOSIS — M6281 Muscle weakness (generalized): Secondary | ICD-10-CM | POA: Diagnosis not present

## 2018-10-06 DIAGNOSIS — E1142 Type 2 diabetes mellitus with diabetic polyneuropathy: Secondary | ICD-10-CM | POA: Diagnosis not present

## 2018-10-07 DIAGNOSIS — E1142 Type 2 diabetes mellitus with diabetic polyneuropathy: Secondary | ICD-10-CM | POA: Diagnosis not present

## 2018-10-07 DIAGNOSIS — R1312 Dysphagia, oropharyngeal phase: Secondary | ICD-10-CM | POA: Diagnosis not present

## 2018-10-07 DIAGNOSIS — Z89512 Acquired absence of left leg below knee: Secondary | ICD-10-CM | POA: Diagnosis not present

## 2018-10-07 DIAGNOSIS — M6281 Muscle weakness (generalized): Secondary | ICD-10-CM | POA: Diagnosis not present

## 2018-10-08 DIAGNOSIS — Z89512 Acquired absence of left leg below knee: Secondary | ICD-10-CM | POA: Diagnosis not present

## 2018-10-08 DIAGNOSIS — R1312 Dysphagia, oropharyngeal phase: Secondary | ICD-10-CM | POA: Diagnosis not present

## 2018-10-08 DIAGNOSIS — E1142 Type 2 diabetes mellitus with diabetic polyneuropathy: Secondary | ICD-10-CM | POA: Diagnosis not present

## 2018-10-08 DIAGNOSIS — M6281 Muscle weakness (generalized): Secondary | ICD-10-CM | POA: Diagnosis not present

## 2018-10-09 DIAGNOSIS — E1142 Type 2 diabetes mellitus with diabetic polyneuropathy: Secondary | ICD-10-CM | POA: Diagnosis not present

## 2018-10-09 DIAGNOSIS — Z89512 Acquired absence of left leg below knee: Secondary | ICD-10-CM | POA: Diagnosis not present

## 2018-10-09 DIAGNOSIS — M6281 Muscle weakness (generalized): Secondary | ICD-10-CM | POA: Diagnosis not present

## 2018-10-09 DIAGNOSIS — R1312 Dysphagia, oropharyngeal phase: Secondary | ICD-10-CM | POA: Diagnosis not present

## 2018-10-10 DIAGNOSIS — R1312 Dysphagia, oropharyngeal phase: Secondary | ICD-10-CM | POA: Diagnosis not present

## 2018-10-10 DIAGNOSIS — E1142 Type 2 diabetes mellitus with diabetic polyneuropathy: Secondary | ICD-10-CM | POA: Diagnosis not present

## 2018-10-10 DIAGNOSIS — Z89512 Acquired absence of left leg below knee: Secondary | ICD-10-CM | POA: Diagnosis not present

## 2018-10-10 DIAGNOSIS — M6281 Muscle weakness (generalized): Secondary | ICD-10-CM | POA: Diagnosis not present

## 2018-10-11 DIAGNOSIS — Z89512 Acquired absence of left leg below knee: Secondary | ICD-10-CM | POA: Diagnosis not present

## 2018-10-11 DIAGNOSIS — M6281 Muscle weakness (generalized): Secondary | ICD-10-CM | POA: Diagnosis not present

## 2018-10-11 DIAGNOSIS — R1312 Dysphagia, oropharyngeal phase: Secondary | ICD-10-CM | POA: Diagnosis not present

## 2018-10-11 DIAGNOSIS — E1142 Type 2 diabetes mellitus with diabetic polyneuropathy: Secondary | ICD-10-CM | POA: Diagnosis not present

## 2018-10-12 DIAGNOSIS — Z89512 Acquired absence of left leg below knee: Secondary | ICD-10-CM | POA: Diagnosis not present

## 2018-10-12 DIAGNOSIS — R1312 Dysphagia, oropharyngeal phase: Secondary | ICD-10-CM | POA: Diagnosis not present

## 2018-10-12 DIAGNOSIS — E1142 Type 2 diabetes mellitus with diabetic polyneuropathy: Secondary | ICD-10-CM | POA: Diagnosis not present

## 2018-10-12 DIAGNOSIS — M6281 Muscle weakness (generalized): Secondary | ICD-10-CM | POA: Diagnosis not present

## 2018-10-13 DIAGNOSIS — E1142 Type 2 diabetes mellitus with diabetic polyneuropathy: Secondary | ICD-10-CM | POA: Diagnosis not present

## 2018-10-13 DIAGNOSIS — R1312 Dysphagia, oropharyngeal phase: Secondary | ICD-10-CM | POA: Diagnosis not present

## 2018-10-13 DIAGNOSIS — Z89512 Acquired absence of left leg below knee: Secondary | ICD-10-CM | POA: Diagnosis not present

## 2018-10-13 DIAGNOSIS — M6281 Muscle weakness (generalized): Secondary | ICD-10-CM | POA: Diagnosis not present

## 2018-10-14 DIAGNOSIS — M6281 Muscle weakness (generalized): Secondary | ICD-10-CM | POA: Diagnosis not present

## 2018-10-14 DIAGNOSIS — E1142 Type 2 diabetes mellitus with diabetic polyneuropathy: Secondary | ICD-10-CM | POA: Diagnosis not present

## 2018-10-14 DIAGNOSIS — R1312 Dysphagia, oropharyngeal phase: Secondary | ICD-10-CM | POA: Diagnosis not present

## 2018-10-14 DIAGNOSIS — Z89512 Acquired absence of left leg below knee: Secondary | ICD-10-CM | POA: Diagnosis not present

## 2018-10-15 DIAGNOSIS — R1312 Dysphagia, oropharyngeal phase: Secondary | ICD-10-CM | POA: Diagnosis not present

## 2018-10-15 DIAGNOSIS — M6281 Muscle weakness (generalized): Secondary | ICD-10-CM | POA: Diagnosis not present

## 2018-10-15 DIAGNOSIS — E1142 Type 2 diabetes mellitus with diabetic polyneuropathy: Secondary | ICD-10-CM | POA: Diagnosis not present

## 2018-10-15 DIAGNOSIS — Z89512 Acquired absence of left leg below knee: Secondary | ICD-10-CM | POA: Diagnosis not present

## 2018-10-16 DIAGNOSIS — Z89512 Acquired absence of left leg below knee: Secondary | ICD-10-CM | POA: Diagnosis not present

## 2018-10-16 DIAGNOSIS — E1142 Type 2 diabetes mellitus with diabetic polyneuropathy: Secondary | ICD-10-CM | POA: Diagnosis not present

## 2018-10-16 DIAGNOSIS — M6281 Muscle weakness (generalized): Secondary | ICD-10-CM | POA: Diagnosis not present

## 2018-10-16 DIAGNOSIS — R1312 Dysphagia, oropharyngeal phase: Secondary | ICD-10-CM | POA: Diagnosis not present

## 2018-10-17 DIAGNOSIS — R1312 Dysphagia, oropharyngeal phase: Secondary | ICD-10-CM | POA: Diagnosis not present

## 2018-10-17 DIAGNOSIS — Z89512 Acquired absence of left leg below knee: Secondary | ICD-10-CM | POA: Diagnosis not present

## 2018-10-17 DIAGNOSIS — E1142 Type 2 diabetes mellitus with diabetic polyneuropathy: Secondary | ICD-10-CM | POA: Diagnosis not present

## 2018-10-17 DIAGNOSIS — M6281 Muscle weakness (generalized): Secondary | ICD-10-CM | POA: Diagnosis not present

## 2018-10-18 DIAGNOSIS — E1142 Type 2 diabetes mellitus with diabetic polyneuropathy: Secondary | ICD-10-CM | POA: Diagnosis not present

## 2018-10-18 DIAGNOSIS — Z89512 Acquired absence of left leg below knee: Secondary | ICD-10-CM | POA: Diagnosis not present

## 2018-10-18 DIAGNOSIS — R1312 Dysphagia, oropharyngeal phase: Secondary | ICD-10-CM | POA: Diagnosis not present

## 2018-10-18 DIAGNOSIS — M6281 Muscle weakness (generalized): Secondary | ICD-10-CM | POA: Diagnosis not present

## 2018-10-19 DIAGNOSIS — Z89512 Acquired absence of left leg below knee: Secondary | ICD-10-CM | POA: Diagnosis not present

## 2018-10-19 DIAGNOSIS — R1312 Dysphagia, oropharyngeal phase: Secondary | ICD-10-CM | POA: Diagnosis not present

## 2018-10-19 DIAGNOSIS — M6281 Muscle weakness (generalized): Secondary | ICD-10-CM | POA: Diagnosis not present

## 2018-10-19 DIAGNOSIS — E1142 Type 2 diabetes mellitus with diabetic polyneuropathy: Secondary | ICD-10-CM | POA: Diagnosis not present

## 2018-10-20 DIAGNOSIS — M6281 Muscle weakness (generalized): Secondary | ICD-10-CM | POA: Diagnosis not present

## 2018-10-20 DIAGNOSIS — Z89512 Acquired absence of left leg below knee: Secondary | ICD-10-CM | POA: Diagnosis not present

## 2018-10-20 DIAGNOSIS — E1142 Type 2 diabetes mellitus with diabetic polyneuropathy: Secondary | ICD-10-CM | POA: Diagnosis not present

## 2018-10-20 DIAGNOSIS — R1312 Dysphagia, oropharyngeal phase: Secondary | ICD-10-CM | POA: Diagnosis not present

## 2018-10-21 DIAGNOSIS — R1312 Dysphagia, oropharyngeal phase: Secondary | ICD-10-CM | POA: Diagnosis not present

## 2018-10-21 DIAGNOSIS — Z89512 Acquired absence of left leg below knee: Secondary | ICD-10-CM | POA: Diagnosis not present

## 2018-10-21 DIAGNOSIS — E1142 Type 2 diabetes mellitus with diabetic polyneuropathy: Secondary | ICD-10-CM | POA: Diagnosis not present

## 2018-10-21 DIAGNOSIS — M6281 Muscle weakness (generalized): Secondary | ICD-10-CM | POA: Diagnosis not present

## 2018-10-22 DIAGNOSIS — R1312 Dysphagia, oropharyngeal phase: Secondary | ICD-10-CM | POA: Diagnosis not present

## 2018-10-22 DIAGNOSIS — M6281 Muscle weakness (generalized): Secondary | ICD-10-CM | POA: Diagnosis not present

## 2018-10-22 DIAGNOSIS — E1142 Type 2 diabetes mellitus with diabetic polyneuropathy: Secondary | ICD-10-CM | POA: Diagnosis not present

## 2018-10-22 DIAGNOSIS — Z89512 Acquired absence of left leg below knee: Secondary | ICD-10-CM | POA: Diagnosis not present

## 2018-10-23 DIAGNOSIS — J302 Other seasonal allergic rhinitis: Secondary | ICD-10-CM | POA: Diagnosis not present

## 2018-10-23 DIAGNOSIS — E1142 Type 2 diabetes mellitus with diabetic polyneuropathy: Secondary | ICD-10-CM | POA: Diagnosis not present

## 2018-10-23 DIAGNOSIS — N390 Urinary tract infection, site not specified: Secondary | ICD-10-CM | POA: Diagnosis not present

## 2018-10-23 DIAGNOSIS — L853 Xerosis cutis: Secondary | ICD-10-CM | POA: Diagnosis not present

## 2018-10-23 DIAGNOSIS — R1312 Dysphagia, oropharyngeal phase: Secondary | ICD-10-CM | POA: Diagnosis not present

## 2018-10-23 DIAGNOSIS — M6281 Muscle weakness (generalized): Secondary | ICD-10-CM | POA: Diagnosis not present

## 2018-10-24 DIAGNOSIS — R1312 Dysphagia, oropharyngeal phase: Secondary | ICD-10-CM | POA: Diagnosis not present

## 2018-10-24 DIAGNOSIS — M6281 Muscle weakness (generalized): Secondary | ICD-10-CM | POA: Diagnosis not present

## 2018-10-25 DIAGNOSIS — R1312 Dysphagia, oropharyngeal phase: Secondary | ICD-10-CM | POA: Diagnosis not present

## 2018-10-25 DIAGNOSIS — M6281 Muscle weakness (generalized): Secondary | ICD-10-CM | POA: Diagnosis not present

## 2018-10-26 DIAGNOSIS — M6281 Muscle weakness (generalized): Secondary | ICD-10-CM | POA: Diagnosis not present

## 2018-10-26 DIAGNOSIS — R1312 Dysphagia, oropharyngeal phase: Secondary | ICD-10-CM | POA: Diagnosis not present

## 2018-10-27 DIAGNOSIS — M6281 Muscle weakness (generalized): Secondary | ICD-10-CM | POA: Diagnosis not present

## 2018-10-27 DIAGNOSIS — R1312 Dysphagia, oropharyngeal phase: Secondary | ICD-10-CM | POA: Diagnosis not present

## 2018-10-28 DIAGNOSIS — M6281 Muscle weakness (generalized): Secondary | ICD-10-CM | POA: Diagnosis not present

## 2018-10-28 DIAGNOSIS — R1312 Dysphagia, oropharyngeal phase: Secondary | ICD-10-CM | POA: Diagnosis not present

## 2018-10-29 DIAGNOSIS — N390 Urinary tract infection, site not specified: Secondary | ICD-10-CM | POA: Diagnosis not present

## 2018-10-29 DIAGNOSIS — E1142 Type 2 diabetes mellitus with diabetic polyneuropathy: Secondary | ICD-10-CM | POA: Diagnosis not present

## 2018-10-29 DIAGNOSIS — J302 Other seasonal allergic rhinitis: Secondary | ICD-10-CM | POA: Diagnosis not present

## 2018-10-29 DIAGNOSIS — G4700 Insomnia, unspecified: Secondary | ICD-10-CM | POA: Diagnosis not present

## 2018-10-29 DIAGNOSIS — R1312 Dysphagia, oropharyngeal phase: Secondary | ICD-10-CM | POA: Diagnosis not present

## 2018-10-29 DIAGNOSIS — M6281 Muscle weakness (generalized): Secondary | ICD-10-CM | POA: Diagnosis not present

## 2018-10-30 DIAGNOSIS — M6281 Muscle weakness (generalized): Secondary | ICD-10-CM | POA: Diagnosis not present

## 2018-10-30 DIAGNOSIS — R1312 Dysphagia, oropharyngeal phase: Secondary | ICD-10-CM | POA: Diagnosis not present

## 2018-11-01 DIAGNOSIS — M6281 Muscle weakness (generalized): Secondary | ICD-10-CM | POA: Diagnosis not present

## 2018-11-01 DIAGNOSIS — R1312 Dysphagia, oropharyngeal phase: Secondary | ICD-10-CM | POA: Diagnosis not present

## 2018-11-02 DIAGNOSIS — R1312 Dysphagia, oropharyngeal phase: Secondary | ICD-10-CM | POA: Diagnosis not present

## 2018-11-02 DIAGNOSIS — M6281 Muscle weakness (generalized): Secondary | ICD-10-CM | POA: Diagnosis not present

## 2018-11-03 DIAGNOSIS — M6281 Muscle weakness (generalized): Secondary | ICD-10-CM | POA: Diagnosis not present

## 2018-11-03 DIAGNOSIS — R1312 Dysphagia, oropharyngeal phase: Secondary | ICD-10-CM | POA: Diagnosis not present

## 2018-11-04 DIAGNOSIS — R1312 Dysphagia, oropharyngeal phase: Secondary | ICD-10-CM | POA: Diagnosis not present

## 2018-11-04 DIAGNOSIS — M6281 Muscle weakness (generalized): Secondary | ICD-10-CM | POA: Diagnosis not present

## 2018-11-05 DIAGNOSIS — R1312 Dysphagia, oropharyngeal phase: Secondary | ICD-10-CM | POA: Diagnosis not present

## 2018-11-05 DIAGNOSIS — M6281 Muscle weakness (generalized): Secondary | ICD-10-CM | POA: Diagnosis not present

## 2018-11-06 DIAGNOSIS — M6281 Muscle weakness (generalized): Secondary | ICD-10-CM | POA: Diagnosis not present

## 2018-11-06 DIAGNOSIS — R1312 Dysphagia, oropharyngeal phase: Secondary | ICD-10-CM | POA: Diagnosis not present

## 2018-11-08 DIAGNOSIS — M6281 Muscle weakness (generalized): Secondary | ICD-10-CM | POA: Diagnosis not present

## 2018-11-08 DIAGNOSIS — R1312 Dysphagia, oropharyngeal phase: Secondary | ICD-10-CM | POA: Diagnosis not present

## 2018-11-09 DIAGNOSIS — R1312 Dysphagia, oropharyngeal phase: Secondary | ICD-10-CM | POA: Diagnosis not present

## 2018-11-09 DIAGNOSIS — M6281 Muscle weakness (generalized): Secondary | ICD-10-CM | POA: Diagnosis not present

## 2018-11-10 DIAGNOSIS — R1312 Dysphagia, oropharyngeal phase: Secondary | ICD-10-CM | POA: Diagnosis not present

## 2018-11-10 DIAGNOSIS — M6281 Muscle weakness (generalized): Secondary | ICD-10-CM | POA: Diagnosis not present

## 2018-11-11 DIAGNOSIS — R1312 Dysphagia, oropharyngeal phase: Secondary | ICD-10-CM | POA: Diagnosis not present

## 2018-11-11 DIAGNOSIS — M6281 Muscle weakness (generalized): Secondary | ICD-10-CM | POA: Diagnosis not present

## 2018-11-12 DIAGNOSIS — R1312 Dysphagia, oropharyngeal phase: Secondary | ICD-10-CM | POA: Diagnosis not present

## 2018-11-12 DIAGNOSIS — M6281 Muscle weakness (generalized): Secondary | ICD-10-CM | POA: Diagnosis not present

## 2018-11-13 DIAGNOSIS — R1312 Dysphagia, oropharyngeal phase: Secondary | ICD-10-CM | POA: Diagnosis not present

## 2018-11-13 DIAGNOSIS — M6281 Muscle weakness (generalized): Secondary | ICD-10-CM | POA: Diagnosis not present

## 2018-11-15 IMAGING — RF DG SWALLOWING FUNCTION - NRPT [facility]
1 series · 18 of 24 positions shown · non-contrast
Comparison: none

[Series 1: run · 33 acquisitions, 18 frames shown]
[im 1/33]
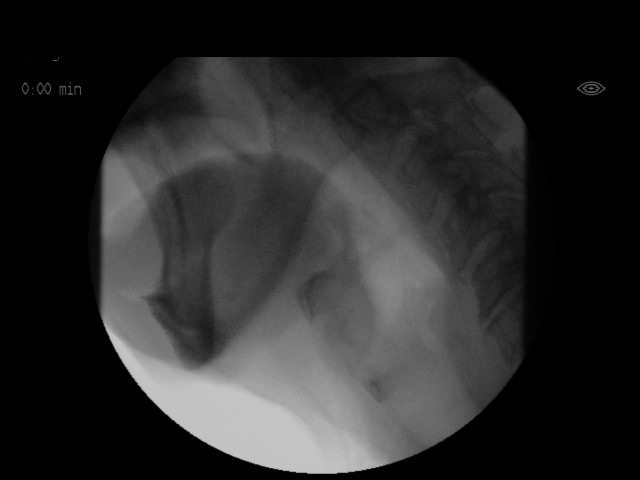
[im 3/33]
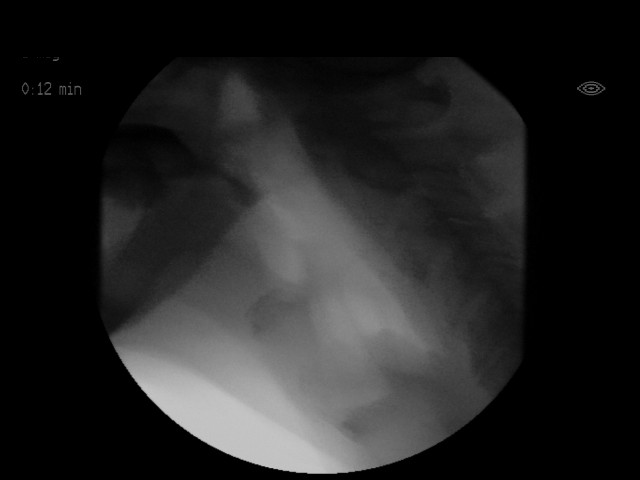
[im 5/33]
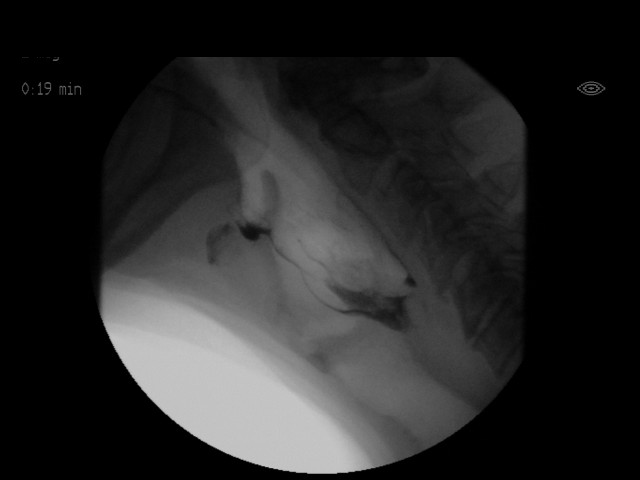
[im 6/33]
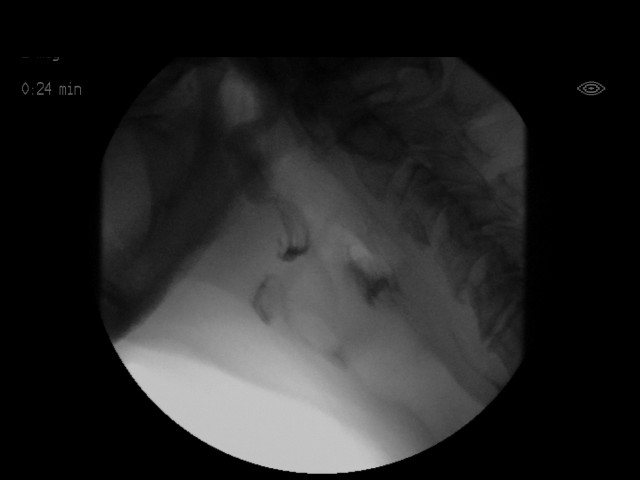
[im 9/33]
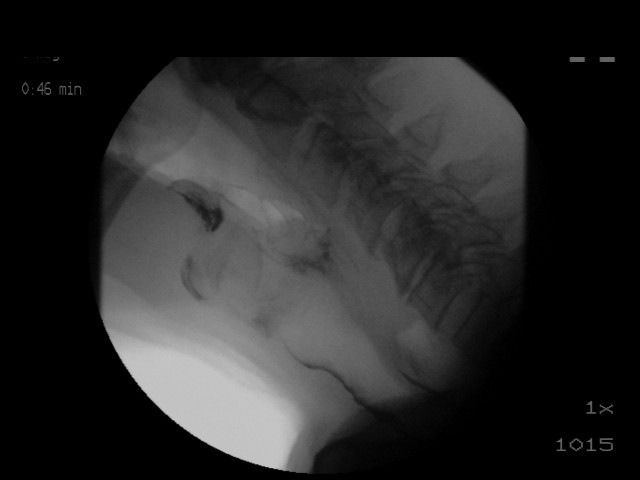
[im 10/33]
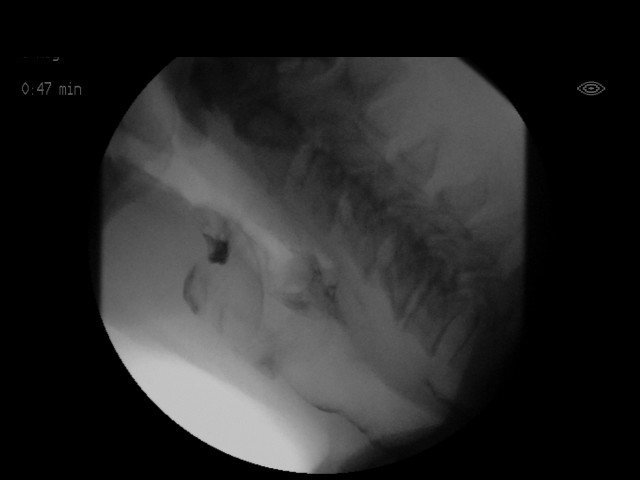
[im 12/33]
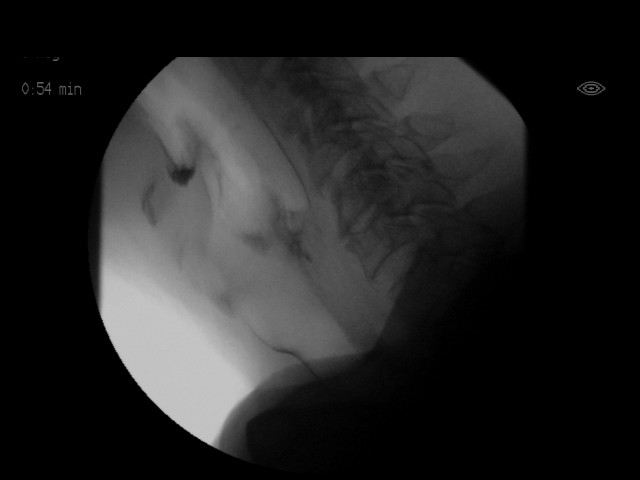
[im 14/33]
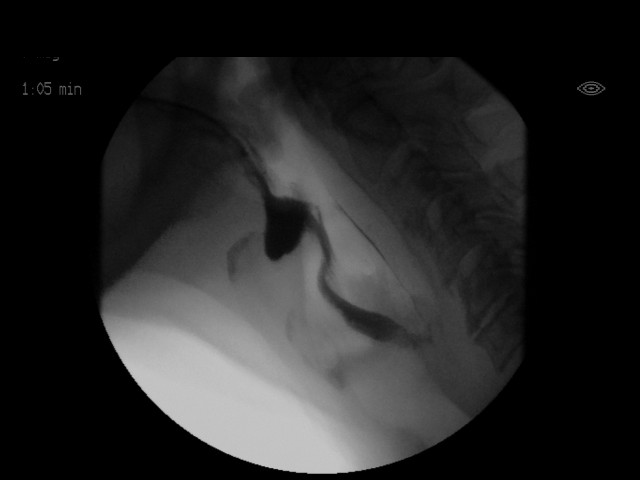
[im 16/33]
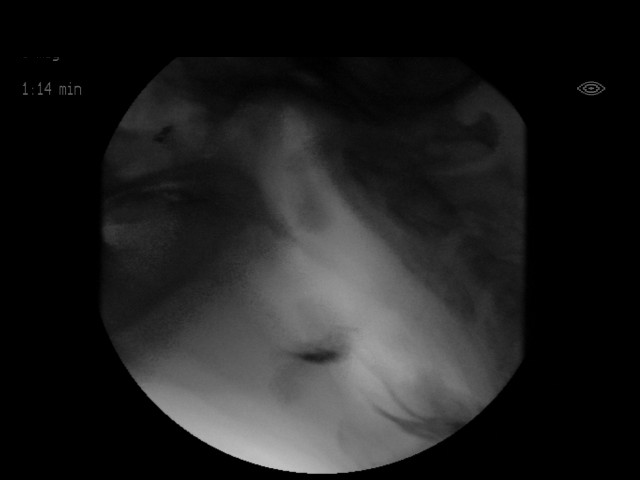
[im 17/33]
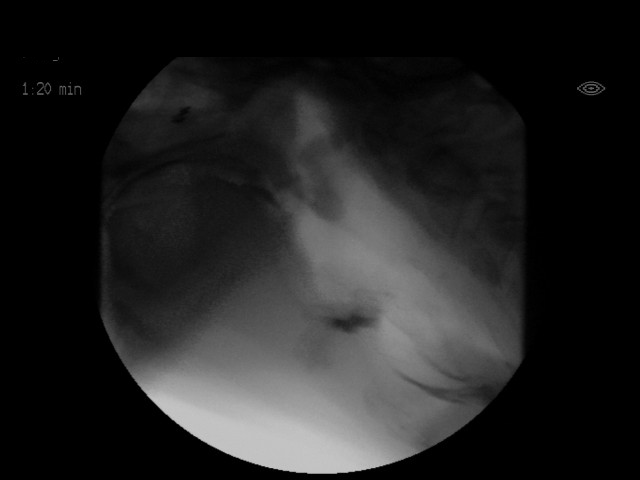
[im 20/33]
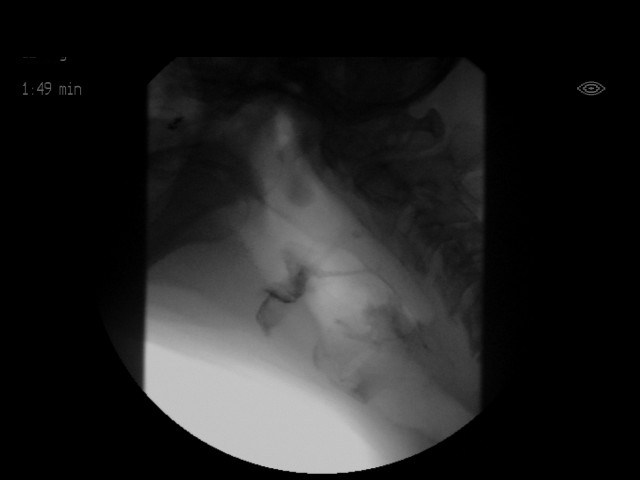
[im 21/33]
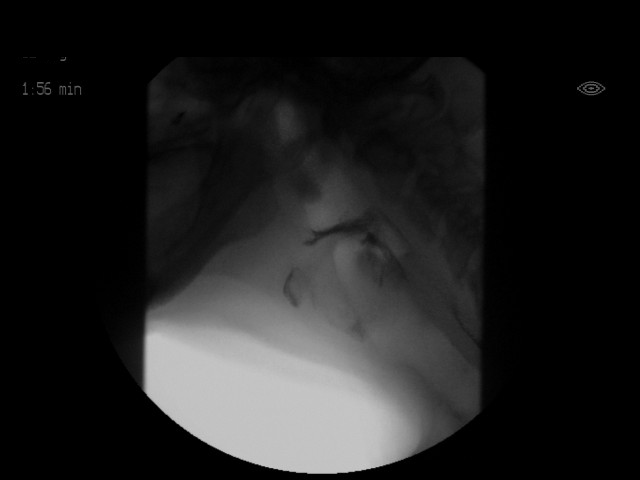
[im 23/33]
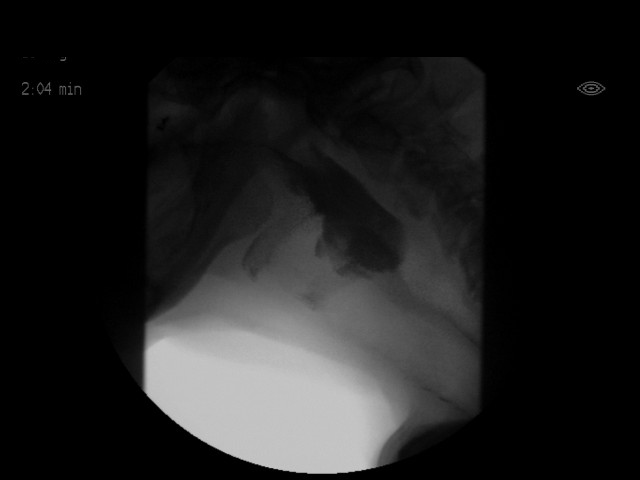
[im 26/33]
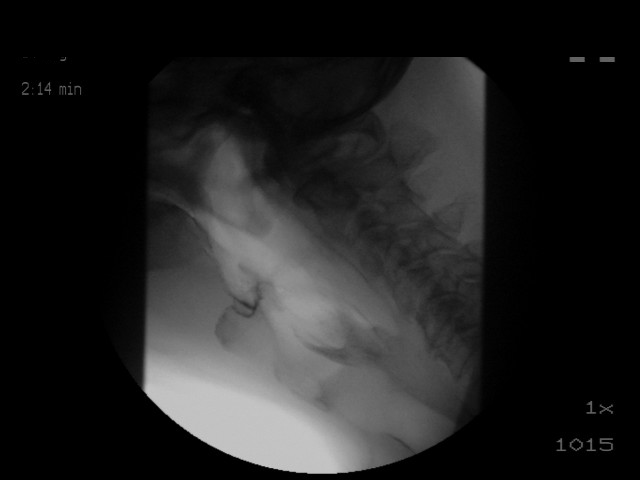
[im 27/33]
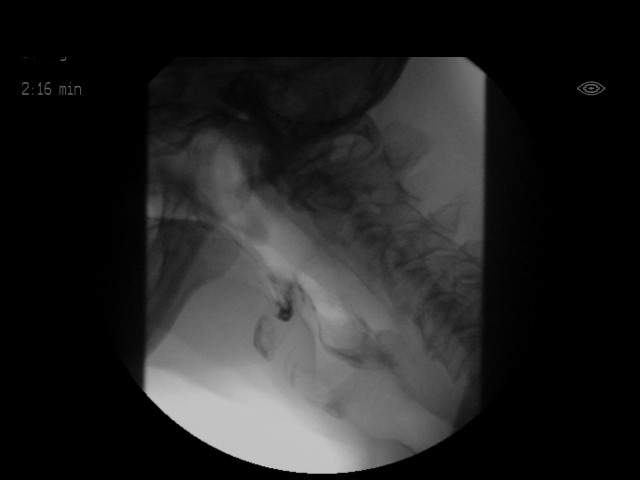
[im 28/33]
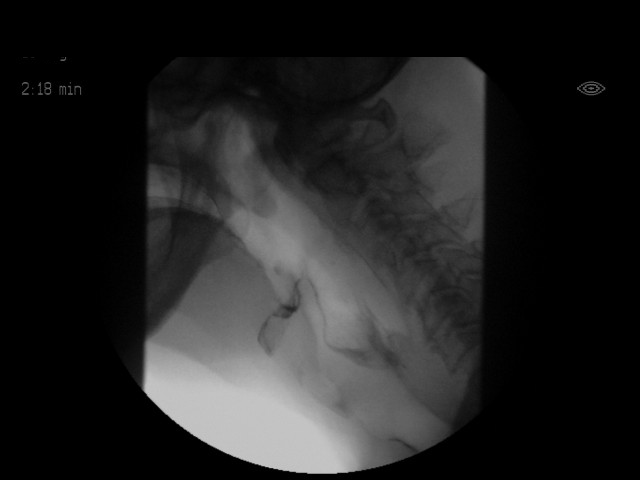
[im 31/33]
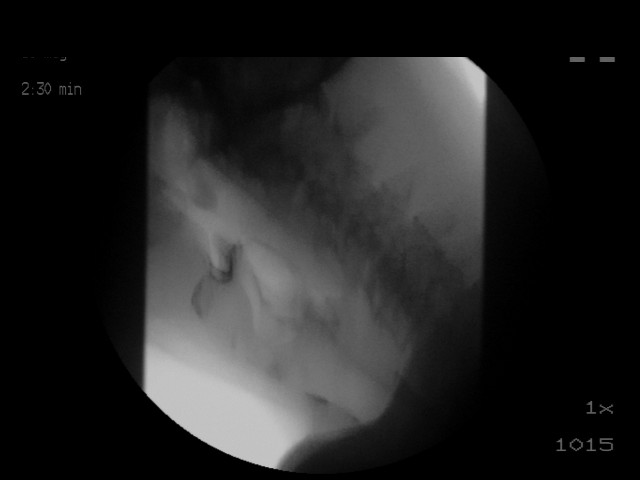
[im 33/33]
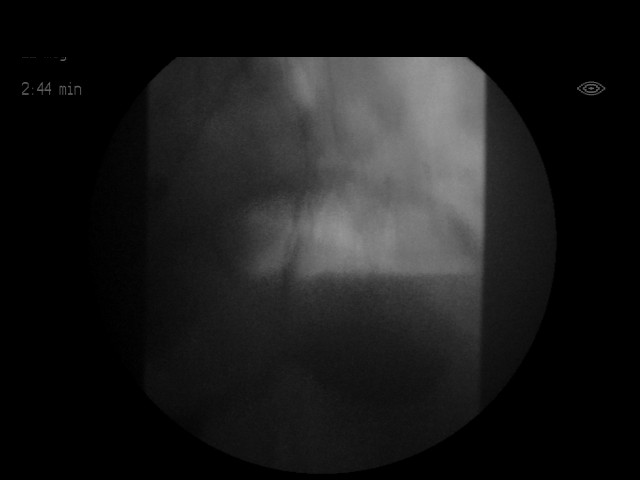

[18 of 24 positions shown; findings below may reference images not displayed]

FLUOROSCOPY FOR SWALLOWING FUNCTION STUDY:
Fluoroscopy was provided for swallowing function study, which was administered by a speech pathologist.  Final results and recommendations from this study are contained within the speech pathology report.

## 2018-11-16 DIAGNOSIS — M6281 Muscle weakness (generalized): Secondary | ICD-10-CM | POA: Diagnosis not present

## 2018-11-16 DIAGNOSIS — R1312 Dysphagia, oropharyngeal phase: Secondary | ICD-10-CM | POA: Diagnosis not present

## 2018-11-17 DIAGNOSIS — J302 Other seasonal allergic rhinitis: Secondary | ICD-10-CM | POA: Diagnosis not present

## 2018-11-17 DIAGNOSIS — M6281 Muscle weakness (generalized): Secondary | ICD-10-CM | POA: Diagnosis not present

## 2018-11-17 DIAGNOSIS — R1312 Dysphagia, oropharyngeal phase: Secondary | ICD-10-CM | POA: Diagnosis not present

## 2018-11-17 DIAGNOSIS — E1142 Type 2 diabetes mellitus with diabetic polyneuropathy: Secondary | ICD-10-CM | POA: Diagnosis not present

## 2018-11-17 DIAGNOSIS — L853 Xerosis cutis: Secondary | ICD-10-CM | POA: Diagnosis not present

## 2018-11-17 DIAGNOSIS — R197 Diarrhea, unspecified: Secondary | ICD-10-CM | POA: Diagnosis not present

## 2018-11-18 DIAGNOSIS — M6281 Muscle weakness (generalized): Secondary | ICD-10-CM | POA: Diagnosis not present

## 2018-11-18 DIAGNOSIS — R1312 Dysphagia, oropharyngeal phase: Secondary | ICD-10-CM | POA: Diagnosis not present

## 2018-11-19 DIAGNOSIS — M6281 Muscle weakness (generalized): Secondary | ICD-10-CM | POA: Diagnosis not present

## 2018-11-19 DIAGNOSIS — R1312 Dysphagia, oropharyngeal phase: Secondary | ICD-10-CM | POA: Diagnosis not present

## 2018-11-20 DIAGNOSIS — M810 Age-related osteoporosis without current pathological fracture: Secondary | ICD-10-CM | POA: Diagnosis not present

## 2018-11-20 DIAGNOSIS — L853 Xerosis cutis: Secondary | ICD-10-CM | POA: Diagnosis not present

## 2018-11-20 DIAGNOSIS — J302 Other seasonal allergic rhinitis: Secondary | ICD-10-CM | POA: Diagnosis not present

## 2018-11-20 DIAGNOSIS — E1142 Type 2 diabetes mellitus with diabetic polyneuropathy: Secondary | ICD-10-CM | POA: Diagnosis not present

## 2018-11-24 DIAGNOSIS — Z89512 Acquired absence of left leg below knee: Secondary | ICD-10-CM | POA: Diagnosis not present

## 2018-11-24 DIAGNOSIS — M6281 Muscle weakness (generalized): Secondary | ICD-10-CM | POA: Diagnosis not present

## 2018-11-25 DIAGNOSIS — R197 Diarrhea, unspecified: Secondary | ICD-10-CM | POA: Diagnosis not present

## 2018-11-25 DIAGNOSIS — M6281 Muscle weakness (generalized): Secondary | ICD-10-CM | POA: Diagnosis not present

## 2018-11-25 DIAGNOSIS — Z89512 Acquired absence of left leg below knee: Secondary | ICD-10-CM | POA: Diagnosis not present

## 2018-11-25 DIAGNOSIS — Z8619 Personal history of other infectious and parasitic diseases: Secondary | ICD-10-CM | POA: Diagnosis not present

## 2018-11-27 DIAGNOSIS — R197 Diarrhea, unspecified: Secondary | ICD-10-CM | POA: Diagnosis not present

## 2018-11-27 DIAGNOSIS — A0472 Enterocolitis due to Clostridium difficile, not specified as recurrent: Secondary | ICD-10-CM | POA: Diagnosis not present

## 2018-11-27 DIAGNOSIS — Z89512 Acquired absence of left leg below knee: Secondary | ICD-10-CM | POA: Diagnosis not present

## 2018-11-27 DIAGNOSIS — M6281 Muscle weakness (generalized): Secondary | ICD-10-CM | POA: Diagnosis not present

## 2018-11-29 IMAGING — XA IR NEPHRO TUBE REMOV/FLUORO
2 series · 7 of 7 positions shown · non-contrast
Comparison: None.

INDICATION: History of hydronephrosis from obstructing ureter stones. History of
bilateral percutaneous nephrostomy tubes and bilateral ureter
stents. Right percutaneous nephrostomy tube was recently removed.
Left nephrostomy tube has been capped and plan for tube removal.

EXAM:
REMOVAL OF LEFT NEPHROSTOMY TUBE WITH FLUOROSCOPIC GUIDANCE

[Series 3: care single · 1 of 1 slices shown]
[im 1/1]
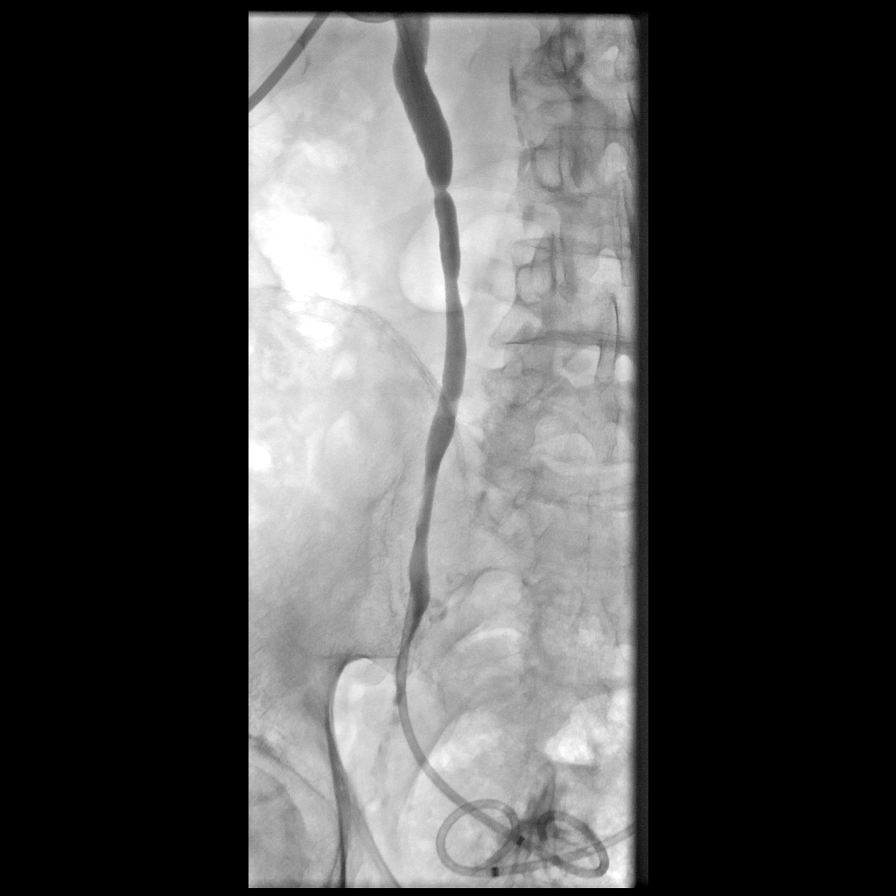

[Series 300: ir nephro tube remov/fl · 6 of 6 slices shown]
[im 1/6]
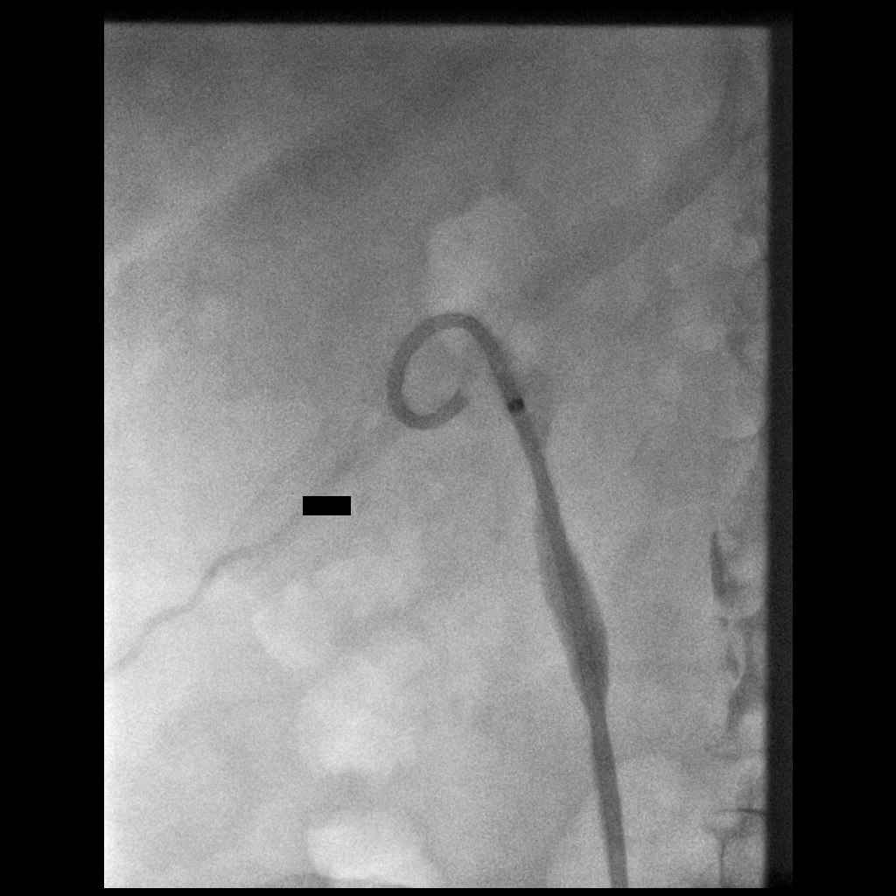
[im 2/6]
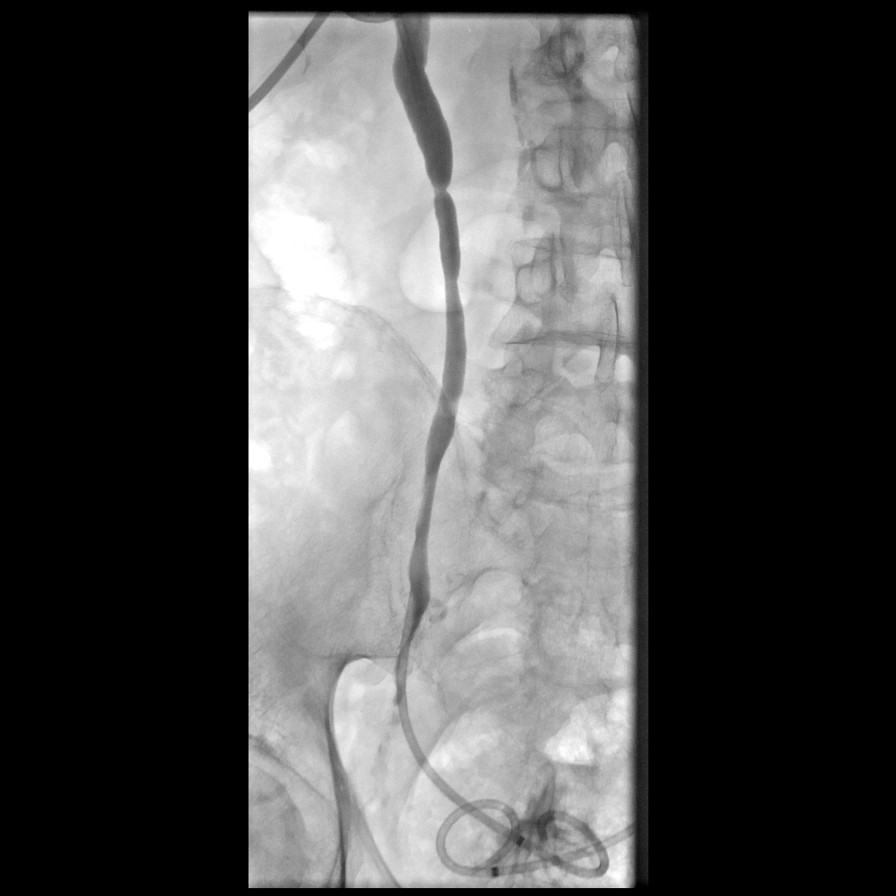
[im 3/6]
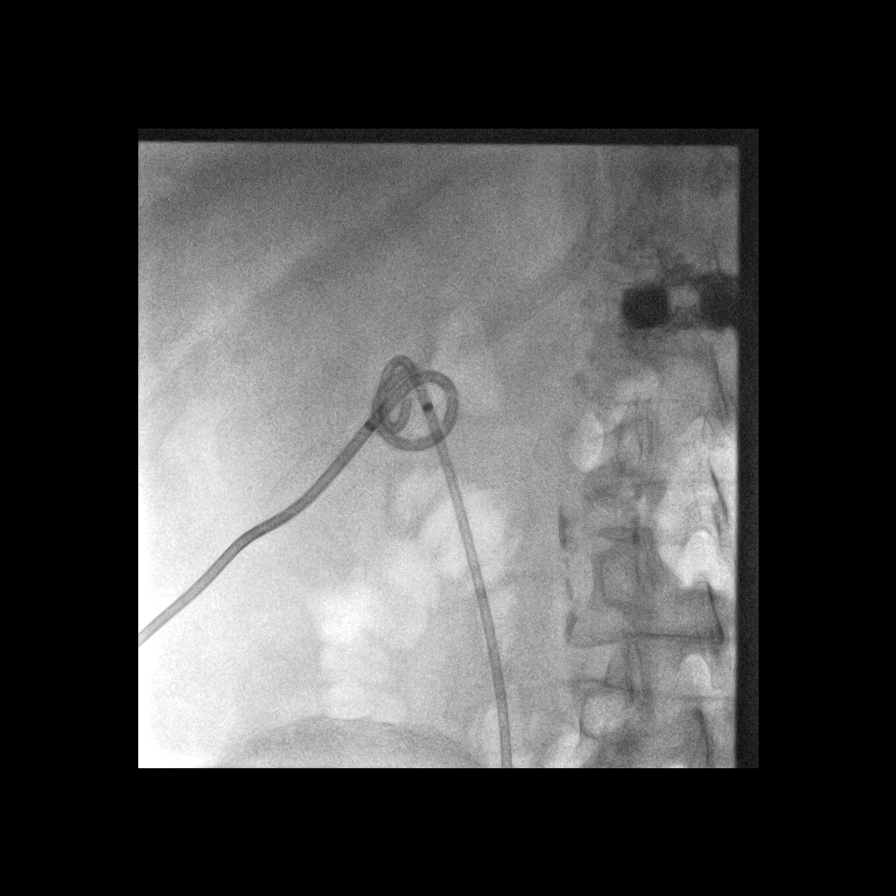
[im 4/6]
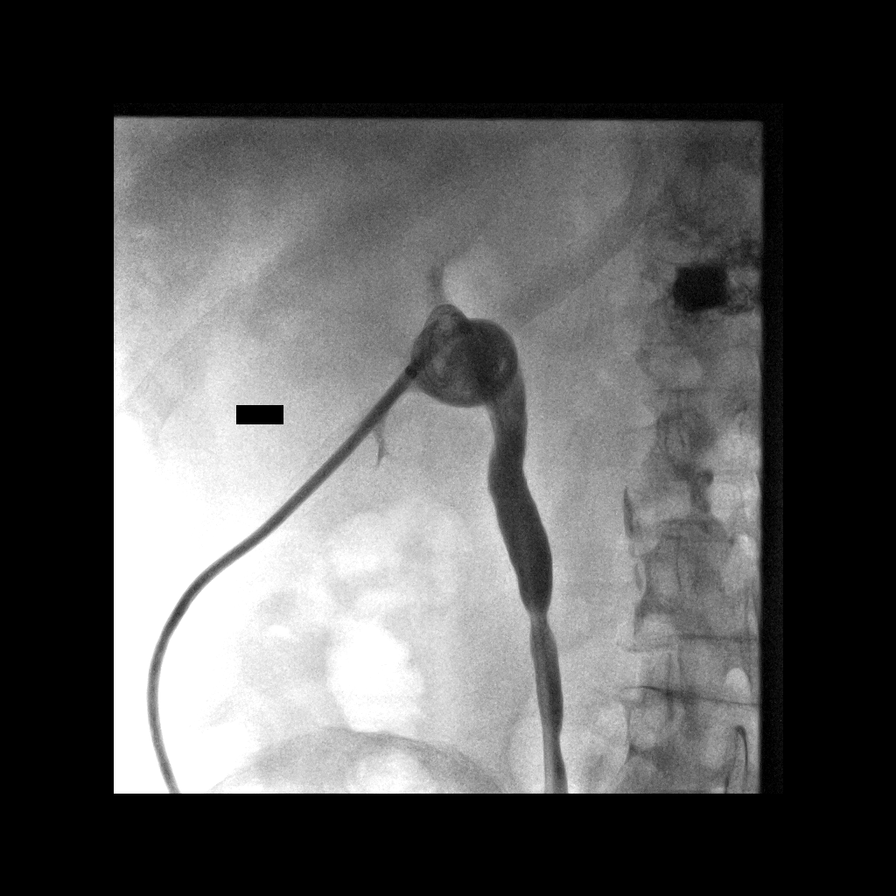
[im 5/6]
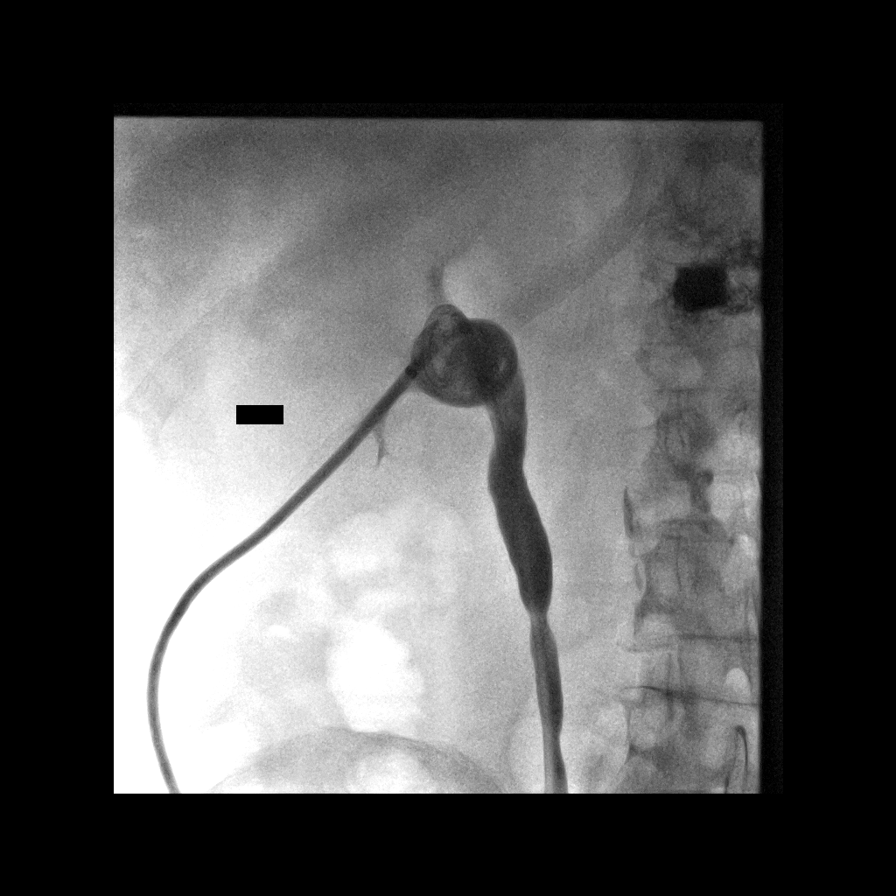
[im 6/6]
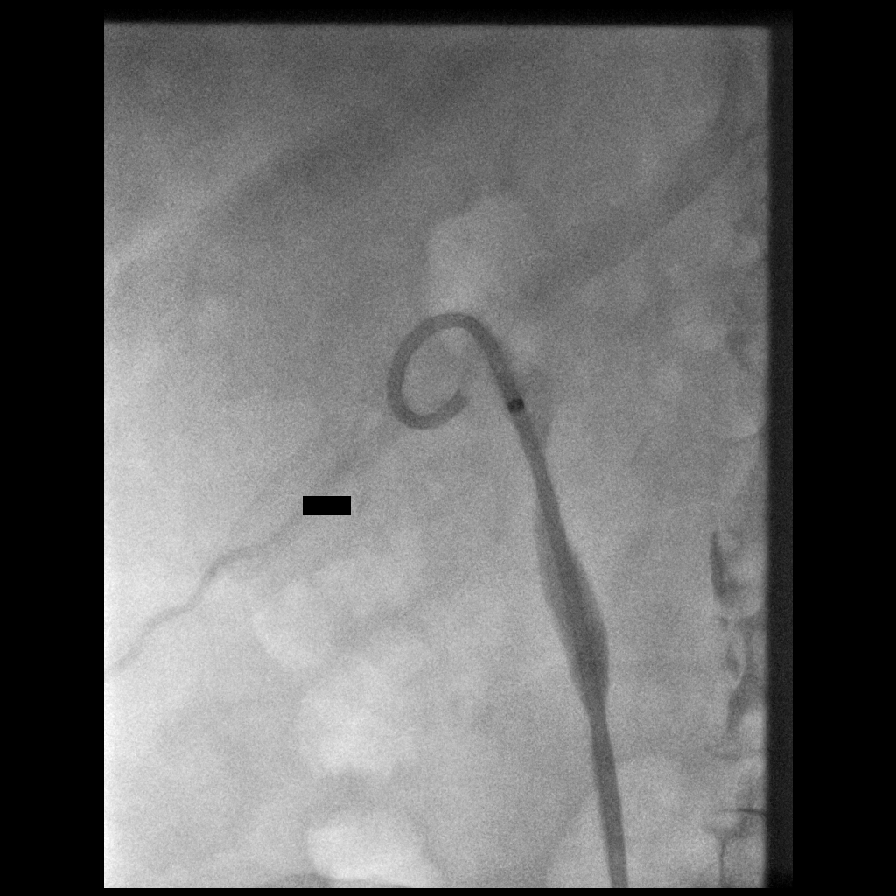

[7 of 7 positions shown; findings below may reference images not displayed]

MEDICATIONS:
None

ANESTHESIA/SEDATION:
None

:
10 mL-administered into the collecting system(s)

FLUOROSCOPY TIME:  Fluoroscopy Time: 36 seconds, 9 mGy

COMPLICATIONS:
None immediate.

PROCEDURE:
Patient was placed prone on the interventional table. The left
nephrostomy tube was prepped and draped in a sterile fashion.
Maximal barrier sterile technique was utilized including caps, mask,
sterile gowns, sterile gloves, sterile drape, hand hygiene and skin
antiseptic.

Contrast was injected through the nephrostomy tube. The nephrostomy
tube was cut and removed using a Bentson wire with fluoroscopic
guidance. Dressing was placed at the old tube site.

Fluoroscopic images were taken and saved for this procedure.
FINDINGS: Nephrostomy tube was well positioned in the renal pelvis. No
significant dilatation of the left renal collecting system. Mild
fullness of the left ureter. Contrast fills the left ureter stent
and drains into the bladder. Nephrostomy tube was easily removed
without dislodging the ureter stent.
IMPRESSION: Successful removal of left percutaneous nephrostomy tube with
fluoroscopic guidance.

Left ureter stent is patent.

## 2018-12-01 DIAGNOSIS — Z89512 Acquired absence of left leg below knee: Secondary | ICD-10-CM | POA: Diagnosis not present

## 2018-12-01 DIAGNOSIS — M6281 Muscle weakness (generalized): Secondary | ICD-10-CM | POA: Diagnosis not present

## 2018-12-10 DIAGNOSIS — A0472 Enterocolitis due to Clostridium difficile, not specified as recurrent: Secondary | ICD-10-CM | POA: Diagnosis not present

## 2018-12-10 DIAGNOSIS — E1142 Type 2 diabetes mellitus with diabetic polyneuropathy: Secondary | ICD-10-CM | POA: Diagnosis not present

## 2018-12-10 DIAGNOSIS — F329 Major depressive disorder, single episode, unspecified: Secondary | ICD-10-CM | POA: Diagnosis not present

## 2018-12-10 DIAGNOSIS — R531 Weakness: Secondary | ICD-10-CM | POA: Diagnosis not present

## 2018-12-18 DIAGNOSIS — E559 Vitamin D deficiency, unspecified: Secondary | ICD-10-CM | POA: Diagnosis not present

## 2018-12-18 DIAGNOSIS — E1142 Type 2 diabetes mellitus with diabetic polyneuropathy: Secondary | ICD-10-CM | POA: Diagnosis not present

## 2018-12-18 DIAGNOSIS — N400 Enlarged prostate without lower urinary tract symptoms: Secondary | ICD-10-CM | POA: Diagnosis not present

## 2018-12-18 DIAGNOSIS — R531 Weakness: Secondary | ICD-10-CM | POA: Diagnosis not present

## 2018-12-28 DIAGNOSIS — A0472 Enterocolitis due to Clostridium difficile, not specified as recurrent: Secondary | ICD-10-CM | POA: Diagnosis not present

## 2018-12-31 DIAGNOSIS — M545 Low back pain: Secondary | ICD-10-CM | POA: Diagnosis not present

## 2018-12-31 DIAGNOSIS — F0390 Unspecified dementia without behavioral disturbance: Secondary | ICD-10-CM | POA: Diagnosis not present

## 2018-12-31 DIAGNOSIS — G4700 Insomnia, unspecified: Secondary | ICD-10-CM | POA: Diagnosis not present

## 2018-12-31 DIAGNOSIS — E1142 Type 2 diabetes mellitus with diabetic polyneuropathy: Secondary | ICD-10-CM | POA: Diagnosis not present

## 2019-01-09 DIAGNOSIS — R6889 Other general symptoms and signs: Secondary | ICD-10-CM | POA: Diagnosis not present

## 2019-01-11 DIAGNOSIS — E1142 Type 2 diabetes mellitus with diabetic polyneuropathy: Secondary | ICD-10-CM | POA: Diagnosis not present

## 2019-01-11 DIAGNOSIS — R531 Weakness: Secondary | ICD-10-CM | POA: Diagnosis not present

## 2019-01-11 DIAGNOSIS — L853 Xerosis cutis: Secondary | ICD-10-CM | POA: Diagnosis not present

## 2019-01-11 DIAGNOSIS — M810 Age-related osteoporosis without current pathological fracture: Secondary | ICD-10-CM | POA: Diagnosis not present

## 2019-01-12 DIAGNOSIS — A0472 Enterocolitis due to Clostridium difficile, not specified as recurrent: Secondary | ICD-10-CM | POA: Diagnosis not present

## 2019-01-19 DIAGNOSIS — R197 Diarrhea, unspecified: Secondary | ICD-10-CM | POA: Diagnosis not present

## 2019-01-20 DIAGNOSIS — R6889 Other general symptoms and signs: Secondary | ICD-10-CM | POA: Diagnosis not present

## 2019-01-20 DIAGNOSIS — D8989 Other specified disorders involving the immune mechanism, not elsewhere classified: Secondary | ICD-10-CM | POA: Diagnosis not present

## 2019-01-20 DIAGNOSIS — D649 Anemia, unspecified: Secondary | ICD-10-CM | POA: Diagnosis not present

## 2019-01-20 DIAGNOSIS — A0472 Enterocolitis due to Clostridium difficile, not specified as recurrent: Secondary | ICD-10-CM | POA: Diagnosis not present

## 2019-01-22 DIAGNOSIS — E785 Hyperlipidemia, unspecified: Secondary | ICD-10-CM | POA: Diagnosis not present

## 2019-01-22 DIAGNOSIS — E1142 Type 2 diabetes mellitus with diabetic polyneuropathy: Secondary | ICD-10-CM | POA: Diagnosis not present

## 2019-01-28 DIAGNOSIS — H1010 Acute atopic conjunctivitis, unspecified eye: Secondary | ICD-10-CM | POA: Diagnosis not present

## 2019-01-28 DIAGNOSIS — R197 Diarrhea, unspecified: Secondary | ICD-10-CM | POA: Diagnosis not present

## 2019-01-28 DIAGNOSIS — E1140 Type 2 diabetes mellitus with diabetic neuropathy, unspecified: Secondary | ICD-10-CM | POA: Diagnosis not present

## 2019-01-28 DIAGNOSIS — E1142 Type 2 diabetes mellitus with diabetic polyneuropathy: Secondary | ICD-10-CM | POA: Diagnosis not present

## 2019-02-10 DIAGNOSIS — F329 Major depressive disorder, single episode, unspecified: Secondary | ICD-10-CM | POA: Diagnosis not present

## 2019-02-10 DIAGNOSIS — H04123 Dry eye syndrome of bilateral lacrimal glands: Secondary | ICD-10-CM | POA: Diagnosis not present

## 2019-02-10 DIAGNOSIS — E1142 Type 2 diabetes mellitus with diabetic polyneuropathy: Secondary | ICD-10-CM | POA: Diagnosis not present

## 2019-02-10 DIAGNOSIS — L219 Seborrheic dermatitis, unspecified: Secondary | ICD-10-CM | POA: Diagnosis not present

## 2019-02-17 DIAGNOSIS — A0472 Enterocolitis due to Clostridium difficile, not specified as recurrent: Secondary | ICD-10-CM | POA: Diagnosis not present

## 2019-02-22 DIAGNOSIS — E785 Hyperlipidemia, unspecified: Secondary | ICD-10-CM | POA: Diagnosis not present

## 2019-02-22 DIAGNOSIS — E1142 Type 2 diabetes mellitus with diabetic polyneuropathy: Secondary | ICD-10-CM | POA: Diagnosis not present

## 2019-02-25 DIAGNOSIS — E1142 Type 2 diabetes mellitus with diabetic polyneuropathy: Secondary | ICD-10-CM | POA: Diagnosis not present

## 2019-02-25 DIAGNOSIS — L219 Seborrheic dermatitis, unspecified: Secondary | ICD-10-CM | POA: Diagnosis not present

## 2019-02-25 DIAGNOSIS — H04123 Dry eye syndrome of bilateral lacrimal glands: Secondary | ICD-10-CM | POA: Diagnosis not present

## 2019-02-25 DIAGNOSIS — A0472 Enterocolitis due to Clostridium difficile, not specified as recurrent: Secondary | ICD-10-CM | POA: Diagnosis not present

## 2019-03-08 DIAGNOSIS — A0472 Enterocolitis due to Clostridium difficile, not specified as recurrent: Secondary | ICD-10-CM | POA: Diagnosis not present

## 2019-03-08 DIAGNOSIS — E1142 Type 2 diabetes mellitus with diabetic polyneuropathy: Secondary | ICD-10-CM | POA: Diagnosis not present

## 2019-03-08 DIAGNOSIS — M810 Age-related osteoporosis without current pathological fracture: Secondary | ICD-10-CM | POA: Diagnosis not present

## 2019-03-08 DIAGNOSIS — E785 Hyperlipidemia, unspecified: Secondary | ICD-10-CM | POA: Diagnosis not present

## 2019-03-15 DIAGNOSIS — U071 COVID-19: Secondary | ICD-10-CM | POA: Diagnosis not present

## 2019-03-23 DIAGNOSIS — E785 Hyperlipidemia, unspecified: Secondary | ICD-10-CM | POA: Diagnosis not present

## 2019-03-23 DIAGNOSIS — E1142 Type 2 diabetes mellitus with diabetic polyneuropathy: Secondary | ICD-10-CM | POA: Diagnosis not present

## 2019-03-25 DIAGNOSIS — U071 COVID-19: Secondary | ICD-10-CM | POA: Diagnosis not present

## 2019-03-27 DIAGNOSIS — K920 Hematemesis: Secondary | ICD-10-CM | POA: Diagnosis not present

## 2019-03-27 DIAGNOSIS — I10 Essential (primary) hypertension: Secondary | ICD-10-CM | POA: Diagnosis not present

## 2019-03-27 DIAGNOSIS — R1110 Vomiting, unspecified: Secondary | ICD-10-CM | POA: Diagnosis not present

## 2019-03-27 DIAGNOSIS — R000 Tachycardia, unspecified: Secondary | ICD-10-CM | POA: Diagnosis not present

## 2019-03-27 DIAGNOSIS — R58 Hemorrhage, not elsewhere classified: Secondary | ICD-10-CM | POA: Diagnosis not present

## 2019-03-27 DIAGNOSIS — E1165 Type 2 diabetes mellitus with hyperglycemia: Secondary | ICD-10-CM | POA: Diagnosis not present

## 2019-03-27 DIAGNOSIS — R112 Nausea with vomiting, unspecified: Secondary | ICD-10-CM | POA: Diagnosis not present

## 2019-03-28 ENCOUNTER — Encounter (HOSPITAL_COMMUNITY): Payer: Self-pay

## 2019-03-28 ENCOUNTER — Emergency Department (HOSPITAL_COMMUNITY): Payer: Medicare Other

## 2019-03-28 ENCOUNTER — Other Ambulatory Visit: Payer: Self-pay

## 2019-03-28 ENCOUNTER — Emergency Department (HOSPITAL_COMMUNITY)
Admission: EM | Admit: 2019-03-28 | Discharge: 2019-03-28 | Disposition: A | Discharge status: Home / Self Care | Payer: Medicare Other | Attending: Emergency Medicine | Admitting: Emergency Medicine

## 2019-03-28 DIAGNOSIS — A0472 Enterocolitis due to Clostridium difficile, not specified as recurrent: Secondary | ICD-10-CM | POA: Diagnosis not present

## 2019-03-28 DIAGNOSIS — E119 Type 2 diabetes mellitus without complications: Secondary | ICD-10-CM | POA: Insufficient documentation

## 2019-03-28 DIAGNOSIS — R109 Unspecified abdominal pain: Secondary | ICD-10-CM | POA: Insufficient documentation

## 2019-03-28 DIAGNOSIS — R112 Nausea with vomiting, unspecified: Secondary | ICD-10-CM | POA: Diagnosis not present

## 2019-03-28 DIAGNOSIS — Z8673 Personal history of transient ischemic attack (TIA), and cerebral infarction without residual deficits: Secondary | ICD-10-CM | POA: Insufficient documentation

## 2019-03-28 DIAGNOSIS — Z743 Need for continuous supervision: Secondary | ICD-10-CM | POA: Diagnosis not present

## 2019-03-28 DIAGNOSIS — Z794 Long term (current) use of insulin: Secondary | ICD-10-CM | POA: Insufficient documentation

## 2019-03-28 DIAGNOSIS — I252 Old myocardial infarction: Secondary | ICD-10-CM | POA: Diagnosis not present

## 2019-03-28 DIAGNOSIS — R279 Unspecified lack of coordination: Secondary | ICD-10-CM | POA: Diagnosis not present

## 2019-03-28 DIAGNOSIS — K529 Noninfective gastroenteritis and colitis, unspecified: Secondary | ICD-10-CM | POA: Diagnosis not present

## 2019-03-28 DIAGNOSIS — Z955 Presence of coronary angioplasty implant and graft: Secondary | ICD-10-CM | POA: Diagnosis not present

## 2019-03-28 DIAGNOSIS — Z7982 Long term (current) use of aspirin: Secondary | ICD-10-CM | POA: Diagnosis not present

## 2019-03-28 DIAGNOSIS — F0390 Unspecified dementia without behavioral disturbance: Secondary | ICD-10-CM | POA: Diagnosis not present

## 2019-03-28 DIAGNOSIS — Z79899 Other long term (current) drug therapy: Secondary | ICD-10-CM | POA: Insufficient documentation

## 2019-03-28 DIAGNOSIS — K922 Gastrointestinal hemorrhage, unspecified: Secondary | ICD-10-CM | POA: Diagnosis not present

## 2019-03-28 DIAGNOSIS — R410 Disorientation, unspecified: Secondary | ICD-10-CM | POA: Diagnosis not present

## 2019-03-28 DIAGNOSIS — Z87891 Personal history of nicotine dependence: Secondary | ICD-10-CM | POA: Insufficient documentation

## 2019-03-28 DIAGNOSIS — R1110 Vomiting, unspecified: Secondary | ICD-10-CM | POA: Diagnosis not present

## 2019-03-28 LAB — COMPREHENSIVE METABOLIC PANEL
ALT: 28 U/L (ref 0–44)
AST: 29 U/L (ref 15–41)
Albumin: 3.9 g/dL (ref 3.5–5.0)
Alkaline Phosphatase: 73 U/L (ref 38–126)
Anion gap: 8 (ref 5–15)
BUN: 27 mg/dL — ABNORMAL HIGH (ref 8–23)
CO2: 21 mmol/L — ABNORMAL LOW (ref 22–32)
Calcium: 8.3 mg/dL — ABNORMAL LOW (ref 8.9–10.3)
Chloride: 108 mmol/L (ref 98–111)
Creatinine, Ser: 0.83 mg/dL (ref 0.61–1.24)
GFR calc Af Amer: >60 mL/min (ref >60)
GFR calc non Af Amer: >60 mL/min (ref >60)
Glucose, Bld: 280 mg/dL — ABNORMAL HIGH (ref 70–99)
Potassium: 3.7 mmol/L (ref 3.5–5.1)
Sodium: 137 mmol/L (ref 135–145)
Total Bilirubin: 0.6 mg/dL (ref 0.3–1.2)
Total Protein: 7.9 g/dL (ref 6.5–8.1)

## 2019-03-28 LAB — CBC WITH DIFFERENTIAL/PLATELET
Abs Immature Granulocytes: 0.06 10*3/uL (ref 0.00–0.07)
Basophils Absolute: 0.1 10*3/uL (ref 0.0–0.1)
Basophils Relative: 0 %
Eosinophils Absolute: 0.1 10*3/uL (ref 0.0–0.5)
Eosinophils Relative: 1 %
HCT: 45.9 % (ref 39.0–52.0)
Hemoglobin: 13.9 g/dL (ref 13.0–17.0)
Immature Granulocytes: 0 %
Lymphocytes Relative: 12 %
Lymphs Abs: 1.8 10*3/uL (ref 0.7–4.0)
MCH: 28.3 pg (ref 26.0–34.0)
MCHC: 30.3 g/dL (ref 30.0–36.0)
MCV: 93.5 fL (ref 80.0–100.0)
Monocytes Absolute: 0.6 10*3/uL (ref 0.1–1.0)
Monocytes Relative: 4 %
Neutro Abs: 11.9 10*3/uL — ABNORMAL HIGH (ref 1.7–7.7)
Neutrophils Relative %: 83 %
Platelets: 230 10*3/uL (ref 150–400)
RBC: 4.91 MIL/uL (ref 4.22–5.81)
RDW: 13.9 % (ref 11.5–15.5)
WBC: 14.5 10*3/uL — ABNORMAL HIGH (ref 4.0–10.5)
nRBC: 0 % (ref 0.0–0.2)

## 2019-03-28 LAB — APTT: aPTT: 31 seconds (ref 24–36)

## 2019-03-28 LAB — CBC
HCT: 41.7 % (ref 39.0–52.0)
Hemoglobin: 12.8 g/dL — ABNORMAL LOW (ref 13.0–17.0)
MCH: 28.1 pg (ref 26.0–34.0)
MCHC: 30.7 g/dL (ref 30.0–36.0)
MCV: 91.4 fL (ref 80.0–100.0)
Platelets: 215 10*3/uL (ref 150–400)
RBC: 4.56 MIL/uL (ref 4.22–5.81)
RDW: 13.9 % (ref 11.5–15.5)
WBC: 13.6 10*3/uL — ABNORMAL HIGH (ref 4.0–10.5)
nRBC: 0 % (ref 0.0–0.2)

## 2019-03-28 LAB — TYPE AND SCREEN
ABO/RH(D): O POS
Antibody Screen: NEGATIVE

## 2019-03-28 LAB — POC OCCULT BLOOD, ED: Fecal Occult Bld: POSITIVE — AB

## 2019-03-28 LAB — MAGNESIUM: Magnesium: 2 mg/dL (ref 1.7–2.4)

## 2019-03-28 LAB — PROTIME-INR
INR: 1 (ref 0.8–1.2)
Prothrombin Time: 12.7 seconds (ref 11.4–15.2)

## 2019-03-28 MED ORDER — PANTOPRAZOLE SODIUM 40 MG IV SOLR
40.0000 mg | Freq: Once | INTRAVENOUS | Status: AC
  Administered 2019-03-28: 40 mg via INTRAVENOUS
  Filled 2019-03-28: qty 40

## 2019-03-28 MED ORDER — LACTATED RINGERS IV BOLUS
1000.0000 mL | Freq: Once | INTRAVENOUS | Status: AC
  Administered 2019-03-28: 1000 mL via INTRAVENOUS

## 2019-03-28 MED ORDER — SODIUM CHLORIDE 0.9 % IV BOLUS
1000.0000 mL | Freq: Once | INTRAVENOUS | Status: AC
  Administered 2019-03-28: 1000 mL via INTRAVENOUS

## 2019-03-28 MED ORDER — PANTOPRAZOLE SODIUM 40 MG PO TBEC: 40.0000 mg | DELAYED_RELEASE_TABLET | Freq: Every day | ORAL | 0 refills | Status: AC

## 2019-03-28 MED ORDER — SODIUM CHLORIDE (PF) 0.9 % IJ SOLN
INTRAMUSCULAR | Status: AC
  Filled 2019-03-28: qty 50

## 2019-03-28 MED ORDER — IOHEXOL 300 MG/ML  SOLN
100.0000 mL | Freq: Once | INTRAMUSCULAR | Status: AC | PRN
  Administered 2019-03-28: 07:00:00 100 mL via INTRAVENOUS

## 2019-03-28 NOTE — ED Triage Notes (Signed)
Patient from [facility] for one episode of coffee ground emesis. Denies pain but pain distended to lower quadrants per EMS. Pt is on C.diff precautions per facility and DNR at bedside.   EMS VS: BP 118/72 Hr 104 CBG 256 Temp 98.0 98% RA  18g LH  4mg  zofran by EMS

## 2019-03-28 NOTE — ED Notes (Signed)
EMS called for transport back to [facility].

## 2019-03-28 NOTE — ED Provider Notes (Signed)
Signed out by Dr Kathrynn Humble that plans to d/c on acid blocker medication, and outpt f/u, and to check ct reading prior to d/c.   Ct with ?thickened distal esophagus, possible esophagitis....no acute infectious process.   Recheck pt - during prolonged ED stay, no recurrent nausea/vomiting. Pt denies pain. Vital signs normal. Abd soft nt.   Discussed plan PPI and outpt GI/pcp f/u with pt/spouse - agreeable with plan.   Return precautions provided.      Lajean Saver, MD 03/28/19 [phone]

## 2019-03-28 NOTE — ED Notes (Signed)
5 calls total placed to [facility]. Nursing at the facility never answered to give report. Also relayed to a front office staff member that discharge instructions with a Protonix RX left  and a personal pillow was left. Front staff person stated that she was going to go find the nurse for the patient's section and see what they would need to do to get the prescription to the patient.

## 2019-03-28 NOTE — ED Provider Notes (Signed)
[facility]-EMERGENCY DEPT Provider Note   CSN: [id number] Arrival date & time: 03/28/19  0008     History   Chief Complaint No chief complaint on file.   HPI Alan SeedsGlenn A Pardon Jr. is a 73 y.o. male.     HPI  73 year old male with history of dementia, diabetes, CAD, stroke comes in a chief complaint of vomiting.  Level 5 caveat for severe dementia.  I called the nursing home and I was informed that patient had 2 episodes of coffee-ground emesis which got the nursing home concern for possible blood in the emesis.  They informed me that patient is on Flagyl for C. difficile colitis.  He has persistent loose bowel movements.  They did not appreciate any blood in his stools.  Patient has no complaints from his side.  Past Medical History:  Diagnosis Date  . Anemia   . Arthritis   . Dementia (HCC)   . DM (diabetes mellitus), type 2 with peripheral vascular complications (HCC)   . ETOH abuse   . Myocardial infarction (HCC)   . Peripheral arterial disease (HCC)   . Stroke [facility](HCC)     Patient Active Problem List   Diagnosis Date Noted  . C. difficile colitis 05/07/2018  . Neurocognitive deficits 02/26/2018  . Dysphagia 02/26/2018  . Klebsiella sepsis (HCC) 12/28/2017  . Uncontrolled type 2 diabetes mellitus with hyperglycemia, with long-term current use of insulin (HCC) 12/27/2017  . DKA, type 2 (HCC) 12/26/2017  . Sepsis due to undetermined organism (HCC) 12/26/2017  . 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 Urinary tract infection associated with nephrostomy catheter (HCC) 07/07/2017  . Acute urinary retention 07/07/2017  . Acute metabolic encephalopathy 07/07/2017  . Ureteral calculus, left   . Urinary tract infection due to Klebsiella species   . Sepsis (HCC) 07/02/2017  . Candida tropicalis infection 06/20/2017  . Bilateral nephrolithiasis 06/20/2017  . History of nephrostomy   . Candidemia (HCC) 06/15/2017  . Obstruction  of kidney   . 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 ABDOMINAL AORTOGRAM N/A 10/25/2016   Procedure: Abdominal Aortogram;  Surgeon: Sherren KernsFields, Charles E, MD;  [facility] CV LAB;  Service: Cardiovascular;  Laterality: N/A;  . BRAIN SURGERY    . COLONOSCOPY  2015   [place]  . CORONARY STENT PLACEMENT     " about 25 years ago (10/29/16)  . ENDARTERECTOMY FEMORAL Right 10/30/2016   Procedure: Eduardo OsierENDARTERECTOMY FEMORAL-RIGHT WITH PATCH GRAFT;  Surgeon: Sherren KernsFields, Charles E, MD;  Location: [facility] OR;  Service: Vascular;  Laterality: Right;  . IR NEPHRO TUBE REMOV/FL  07/04/2017  . IR NEPHRO TUBE REMOV/FL  07/07/2017  . IR NEPHROSTOMY EXCHANGE LEFT  06/20/2017  . IR NEPHROSTOMY EXCHANGE RIGHT  06/20/2017  . IR NEPHROSTOMY PLACEMENT LEFT  06/12/2017  . IR NEPHROSTOMY PLACEMENT RIGHT  06/12/2017  . IR URETERAL STENT PLACEMENT EXISTING ACCESS LEFT  06/20/2017  . IR URETERAL STENT PLACEMENT EXISTING ACCESS RIGHT  06/20/2017  . LOWER EXTREMITY ANGIOGRAPHY Right 10/25/2016   Procedure: Lower Extremity Angiography;  Surgeon: Sherren KernsFields, Charles E, MD;  [facility];  Service: Cardiovascular;  Laterality: Right;        Home Medications    Prior to Admission medications   Medication Sig Start Date End Date Taking? Authorizing Provider  alendronate (FOSAMAX) 70 MG tablet  Take 70 mg by mouth every Sunday. Take with a full glass of water on an empty stomach.    Yes [provider]  aspirin EC 81 MG tablet Take 81 mg by mouth every morning.    Yes [provider]  atorvastatin (LIPITOR) 10 MG tablet Take 10 mg by mouth at bedtime.    Yes [provider]  busPIRone (BUSPAR) 10 MG tablet Take 10 mg by mouth 2 (two) times daily.   Yes [provider]  Cholecalciferol (QC VITAMIN D3) 2000 units  TABS Take 2,000 Units by mouth daily.    Yes [provider]  fluticasone (FLONASE) 50 MCG/ACT nasal spray Place 2 sprays into both nostrils daily.   Yes [provider]  geriatric multivitamins-minerals (ELDERTONIC/GEVRABON) LIQD Take 15 mLs by mouth 2 (two) times daily.   Yes [provider]  hydroxypropyl methylcellulose / hypromellose (ISOPTO TEARS / GONIOVISC) 2.5 % ophthalmic solution Place 1 drop into both eyes 2 (two) times daily.   Yes [provider]  insulin aspart (NOVOLOG) 100 UNIT/ML injection Inject 2-10 Units into the skin 4 (four) times daily.    Yes [provider]  insulin glargine (LANTUS) 100 UNIT/ML injection Inject 18 Units into the skin at bedtime.  06/11/16  Yes [provider]  KETOCONAZOLE, TOPICAL, 1 % SHAM Apply 1 application topically 2 (two) times a week. Monday and thursday   Yes [provider]  LORazepam (ATIVAN) 1 MG tablet Take 0.5 tablets (0.5 mg total) by mouth 2 (two) times daily. 08/24/18  Yes Edmon Crape C, PA-C  Melatonin 10 MG TABS Take 10 mg by mouth at bedtime.   Yes [provider]  Probiotic Product (RISA-BID PROBIOTIC PO) Take 1 capsule by mouth 2 (two) times daily.   Yes [provider]  Psyllium (METAMUCIL FIBER) 51.7 % PACK Take 1 Package by mouth 2 (two) times daily.   Yes [provider]  sertraline (ZOLOFT) 50 MG tablet Take 150 mg by mouth at bedtime.    Yes [provider]  tamsulosin (FLOMAX) 0.4 MG CAPS capsule Take 0.8 mg by mouth every evening.    Yes [provider]  traZODone (DESYREL) 50 MG tablet Take 50 mg by mouth at bedtime.  09/11/16  Yes [provider]  acetaminophen (TYLENOL) 500 MG tablet Take 500 mg by mouth every 6 (six) hours as needed for mild pain.    [provider]    Family History Family History  Problem Relation Age of Onset  . Diabetes Mother   . Heart disease Father   . Heart disease  Brother     Social History Social History   Tobacco Use  . Smoking status: Former Smoker    Types: Cigarettes  . Smokeless tobacco: Never Used  . Tobacco comment: Quit smoking cigarettes 10/ 2017  Substance Use Topics  . Alcohol use: No    Comment: Alcohol abuse PMH  . Drug use: No     Allergies   Penicillins   Review of Systems Review of Systems  Unable to perform ROS: Dementia     Physical Exam Updated Vital Signs BP 137/68   Pulse 80   Temp 98.3 F (36.8 C) (Oral)   Resp 17   SpO2 94%   Physical Exam Vitals signs and nursing note reviewed.  Constitutional:      General: He is not in acute distress.    Appearance: He is well-developed.  HENT:     Head:  Atraumatic.  Neck:     Musculoskeletal: Neck supple.  Cardiovascular:     Rate and Rhythm: Normal rate.  Pulmonary:     Effort: Pulmonary effort is normal.  Abdominal:     General: There is distension.     Tenderness: There is no abdominal tenderness. There is no guarding or rebound.     Comments: Patient does not not have melena or BRBPR. Guaiac positive stools appreciated  Skin:    General: Skin is warm.  Neurological:     Mental Status: Mental status is at baseline.      ED Treatments / Results  Labs (all labs ordered are listed, but only abnormal results are displayed) Labs Reviewed  COMPREHENSIVE METABOLIC PANEL - Abnormal; Notable for the following components:      Result Value   CO2 21 (*)    Glucose, Bld 280 (*)    BUN 27 (*)    Calcium 8.3 (*)    All other components within normal limits  CBC WITH DIFFERENTIAL/PLATELET - Abnormal; Notable for the following components:   WBC 14.5 (*)    Neutro Abs 11.9 (*)    All other components within normal limits  CBC - Abnormal; Notable for the following components:   WBC 13.6 (*)    Hemoglobin 12.8 (*)    All other components within normal limits  POC OCCULT BLOOD, ED - Abnormal; Notable for the following components:   Fecal Occult Bld  POSITIVE (*)    All other components within normal limits  PROTIME-INR  APTT  MAGNESIUM  TYPE AND SCREEN  ABO/RH    EKG None  Radiology Dg Chest Port 1 View  Result Date: 03/28/2019 CLINICAL DATA:  Coffee-ground emesis and possible aspiration. EXAM: PORTABLE CHEST 1 VIEW COMPARISON:  12/26/2017 FINDINGS: Shallow lung inflation. Normal cardiomediastinal contours. No focal airspace consolidation or pulmonary edema. IMPRESSION: No active disease. Electronically Signed   By: Deatra Robinson M.D.   On: 03/28/2019 06:10   Dg Abd Portable 1 View  Result Date: 03/28/2019 CLINICAL DATA:  Coffee-ground emesis EXAM: PORTABLE ABDOMEN - 1 VIEW COMPARISON:  07/04/2017 FINDINGS: Diffusely gas distended colon. No definite dilated small bowel visualized. IMPRESSION: Diffusely gas distended colon. CT of the abdomen and pelvis may be helpful for further assessment. Electronically Signed   By: Deatra Robinson M.D.   On: 03/28/2019 06:13    Procedures Procedures (including critical care time)  Medications Ordered in ED Medications  sodium chloride (PF) 0.9 % injection (has no administration in time range)  iohexol (OMNIPAQUE) 300 MG/ML solution 100 mL (has no administration in time range)  pantoprazole (PROTONIX) injection 40 mg (40 mg Intravenous Given 03/28/19 0338)  lactated ringers bolus 1,000 mL (0 mLs Intravenous Stopped 03/28/19 0548)  sodium chloride 0.9 % bolus 1,000 mL (1,000 mLs Intravenous New Bag/Given 03/28/19 0644)     Initial Impression / Assessment and Plan / ED Course  I have reviewed the triage vital signs and the nursing notes.  Pertinent labs & imaging results that were available during my care of the patient were reviewed by me and considered in my medical decision making (see chart for details).  Clinical Course as of Mar 27 700  Wynelle Link Mar 28, 2019  7169 White count is slightly elevated, consistent with likely C. difficile infection.  WBC(!): 14.5 [AN]  0538 BUN/creatinine ratio  is over 20.  We will give him some IV fluid.  BUN(!): 27 [AN]  0538 Guaiac positive stools with normal hemoglobin.  We will get a repeat CBC to ensure there is no significant drop prior to discharge.  Hemoglobin: 13.9 [AN]  0538 Patient reassessed.  No new emesis.  KUB and portable chest ordered to ensure there is no distended bowels or aspiration.   [AN]  0659 Hemoglobin is still reassuring.  BUN is 27, likely prerenal.  Suspicion is low that the BUN is elevated because of GI bleed given no significant drop in hemoglobin nor any repeat episodes of vomitus.  Patient's wife is now at the bedside.  She is concerned that her husband is getting worse because of the C. difficile colitis that has been relentless.  She was also concerned of some abdominal pain and back pain that patient has complained about.  I repeated the exam with her at the bedside.  Patient had mild discomfort on the right side, but there was no peritoneal findings.  Acute abdominal series did reveal distended colon.  I discussed these findings with her.  We discussed with her the conservative versus aggressive options of further diagnostic work-up and she has opted for getting a CT scan while the patient is still here.  Dr. Albertine Patricia will follow-up on the results.  The CT scan is not having any acute changes then he will be stable for discharge.  Hemoglobin(!): 12.8 [AN]    Clinical Course User Index [AN] Varney Biles, MD       73 year old comes in a chief complaint of vomiting. He had 2 episodes of coffee-ground emesis and is currently being treated for C. difficile colitis.  He also has history of diabetes and CAD.  On exam he has no significant abdominal discomfort.  DDx includes: Esophagitis Mallory Weiss tear [place]  Variceal bleeding PUD/Gastritis/ulcers Diverticular bleed Colon cancer Rectal bleed Internal hemorrhoids External hemorrhoids  History significantly limited.  We will get CBC and basic labs. We  will also get further imaging.    Final Clinical Impressions(s) / ED Diagnoses   Final diagnoses:  Colitis    ED Discharge Orders    None       Varney Biles, MD 03/28/19 [phone]

## 2019-03-28 NOTE — Discharge Instructions (Addendum)
Alan [name] was seen in the ED for coffee-ground emesis.Hemoglobin in the ER is stable.  He had no further episodes of vomiting in the ER.  His stools did not have any gross blood either. Work-up in the ER also included CT scan, which did not show any acute concerns.  Take protonix (acid blocker medication) as prescribed. Continue with the plan for follow-up with a GI doctor in the coming week - call office Monday AM to arrange appointment. Continue management for the C. difficile colitis. Ensure he is well-hydrated.  Return to ER if worse, new symptoms, persistent/recurrent vomiting, any vomiting of large amount of blood, black/tarry stools, weak/fainting, new or severe pain, fevers, or other concern.

## 2019-03-29 DIAGNOSIS — R935 Abnormal findings on diagnostic imaging of other abdominal regions, including retroperitoneum: Secondary | ICD-10-CM | POA: Diagnosis not present

## 2019-03-29 DIAGNOSIS — F0390 Unspecified dementia without behavioral disturbance: Secondary | ICD-10-CM | POA: Diagnosis not present

## 2019-03-29 DIAGNOSIS — G4700 Insomnia, unspecified: Secondary | ICD-10-CM | POA: Diagnosis not present

## 2019-03-29 DIAGNOSIS — Z8619 Personal history of other infectious and parasitic diseases: Secondary | ICD-10-CM | POA: Diagnosis not present

## 2019-03-29 DIAGNOSIS — K920 Hematemesis: Secondary | ICD-10-CM | POA: Diagnosis not present

## 2019-03-29 DIAGNOSIS — E1142 Type 2 diabetes mellitus with diabetic polyneuropathy: Secondary | ICD-10-CM | POA: Diagnosis not present

## 2019-03-29 LAB — ABO/RH: ABO/RH(D): O POS

## 2019-03-30 DIAGNOSIS — D649 Anemia, unspecified: Secondary | ICD-10-CM | POA: Diagnosis not present

## 2019-03-30 DIAGNOSIS — E785 Hyperlipidemia, unspecified: Secondary | ICD-10-CM | POA: Diagnosis not present

## 2019-03-30 DIAGNOSIS — Z79899 Other long term (current) drug therapy: Secondary | ICD-10-CM | POA: Diagnosis not present

## 2019-03-31 DIAGNOSIS — A0472 Enterocolitis due to Clostridium difficile, not specified as recurrent: Secondary | ICD-10-CM | POA: Diagnosis not present

## 2019-03-31 DIAGNOSIS — R946 Abnormal results of thyroid function studies: Secondary | ICD-10-CM | POA: Diagnosis not present

## 2019-04-02 DIAGNOSIS — A0472 Enterocolitis due to Clostridium difficile, not specified as recurrent: Secondary | ICD-10-CM | POA: Diagnosis not present

## 2019-04-02 DIAGNOSIS — Z79899 Other long term (current) drug therapy: Secondary | ICD-10-CM | POA: Diagnosis not present

## 2019-04-05 DIAGNOSIS — Z23 Encounter for immunization: Secondary | ICD-10-CM | POA: Diagnosis not present

## 2019-04-08 ENCOUNTER — Other Ambulatory Visit: Payer: Self-pay

## 2019-04-08 ENCOUNTER — Inpatient Hospital Stay (HOSPITAL_COMMUNITY)
Admission: EM | Admit: 2019-04-08 | Discharge: 2019-04-13 | DRG: 392 | Disposition: A | Discharge status: Skilled Nursing Facility | Payer: Medicare Other | Attending: Internal Medicine | Admitting: Internal Medicine

## 2019-04-08 ENCOUNTER — Emergency Department (HOSPITAL_COMMUNITY): Payer: Medicare Other

## 2019-04-08 ENCOUNTER — Encounter (HOSPITAL_COMMUNITY): Payer: Self-pay

## 2019-04-08 DIAGNOSIS — R1312 Dysphagia, oropharyngeal phase: Secondary | ICD-10-CM | POA: Diagnosis not present

## 2019-04-08 DIAGNOSIS — N400 Enlarged prostate without lower urinary tract symptoms: Secondary | ICD-10-CM | POA: Diagnosis present

## 2019-04-08 DIAGNOSIS — Z66 Do not resuscitate: Secondary | ICD-10-CM | POA: Diagnosis present

## 2019-04-08 DIAGNOSIS — H5789 Other specified disorders of eye and adnexa: Secondary | ICD-10-CM | POA: Diagnosis present

## 2019-04-08 DIAGNOSIS — Z794 Long term (current) use of insulin: Secondary | ICD-10-CM | POA: Diagnosis not present

## 2019-04-08 DIAGNOSIS — E639 Nutritional deficiency, unspecified: Secondary | ICD-10-CM | POA: Diagnosis not present

## 2019-04-08 DIAGNOSIS — Z833 Family history of diabetes mellitus: Secondary | ICD-10-CM

## 2019-04-08 DIAGNOSIS — R1032 Left lower quadrant pain: Secondary | ICD-10-CM | POA: Diagnosis not present

## 2019-04-08 DIAGNOSIS — K2960 Other gastritis without bleeding: Secondary | ICD-10-CM | POA: Diagnosis present

## 2019-04-08 DIAGNOSIS — H029 Unspecified disorder of eyelid: Secondary | ICD-10-CM | POA: Diagnosis present

## 2019-04-08 DIAGNOSIS — G8929 Other chronic pain: Secondary | ICD-10-CM | POA: Diagnosis present

## 2019-04-08 DIAGNOSIS — K920 Hematemesis: Secondary | ICD-10-CM | POA: Diagnosis not present

## 2019-04-08 DIAGNOSIS — E1151 Type 2 diabetes mellitus with diabetic peripheral angiopathy without gangrene: Secondary | ICD-10-CM | POA: Diagnosis present

## 2019-04-08 DIAGNOSIS — E1165 Type 2 diabetes mellitus with hyperglycemia: Secondary | ICD-10-CM | POA: Diagnosis present

## 2019-04-08 DIAGNOSIS — F0390 Unspecified dementia without behavioral disturbance: Secondary | ICD-10-CM

## 2019-04-08 DIAGNOSIS — Z03818 Encounter for observation for suspected exposure to other biological agents ruled out: Secondary | ICD-10-CM | POA: Diagnosis not present

## 2019-04-08 DIAGNOSIS — Z89512 Acquired absence of left leg below knee: Secondary | ICD-10-CM | POA: Diagnosis not present

## 2019-04-08 DIAGNOSIS — K219 Gastro-esophageal reflux disease without esophagitis: Secondary | ICD-10-CM | POA: Diagnosis present

## 2019-04-08 DIAGNOSIS — A0471 Enterocolitis due to Clostridium difficile, recurrent: Secondary | ICD-10-CM | POA: Diagnosis not present

## 2019-04-08 DIAGNOSIS — R1084 Generalized abdominal pain: Secondary | ICD-10-CM | POA: Diagnosis not present

## 2019-04-08 DIAGNOSIS — Z87891 Personal history of nicotine dependence: Secondary | ICD-10-CM

## 2019-04-08 DIAGNOSIS — F419 Anxiety disorder, unspecified: Secondary | ICD-10-CM | POA: Diagnosis not present

## 2019-04-08 DIAGNOSIS — R52 Pain, unspecified: Secondary | ICD-10-CM | POA: Diagnosis not present

## 2019-04-08 DIAGNOSIS — I2510 Atherosclerotic heart disease of native coronary artery without angina pectoris: Secondary | ICD-10-CM | POA: Diagnosis not present

## 2019-04-08 DIAGNOSIS — Z880 Allergy status to penicillin: Secondary | ICD-10-CM

## 2019-04-08 DIAGNOSIS — M2550 Pain in unspecified joint: Secondary | ICD-10-CM | POA: Diagnosis not present

## 2019-04-08 DIAGNOSIS — Z7951 Long term (current) use of inhaled steroids: Secondary | ICD-10-CM

## 2019-04-08 DIAGNOSIS — K567 Ileus, unspecified: Secondary | ICD-10-CM | POA: Diagnosis present

## 2019-04-08 DIAGNOSIS — H1031 Unspecified acute conjunctivitis, right eye: Secondary | ICD-10-CM | POA: Diagnosis present

## 2019-04-08 DIAGNOSIS — Z955 Presence of coronary angioplasty implant and graft: Secondary | ICD-10-CM

## 2019-04-08 DIAGNOSIS — R278 Other lack of coordination: Secondary | ICD-10-CM | POA: Diagnosis not present

## 2019-04-08 DIAGNOSIS — Z79899 Other long term (current) drug therapy: Secondary | ICD-10-CM

## 2019-04-08 DIAGNOSIS — M545 Low back pain: Secondary | ICD-10-CM | POA: Diagnosis not present

## 2019-04-08 DIAGNOSIS — Z8619 Personal history of other infectious and parasitic diseases: Secondary | ICD-10-CM | POA: Diagnosis not present

## 2019-04-08 DIAGNOSIS — M549 Dorsalgia, unspecified: Secondary | ICD-10-CM | POA: Diagnosis present

## 2019-04-08 DIAGNOSIS — Z20828 Contact with and (suspected) exposure to other viral communicable diseases: Secondary | ICD-10-CM | POA: Diagnosis present

## 2019-04-08 DIAGNOSIS — E876 Hypokalemia: Secondary | ICD-10-CM | POA: Diagnosis present

## 2019-04-08 DIAGNOSIS — Z8673 Personal history of transient ischemic attack (TIA), and cerebral infarction without residual deficits: Secondary | ICD-10-CM | POA: Diagnosis not present

## 2019-04-08 DIAGNOSIS — G4700 Insomnia, unspecified: Secondary | ICD-10-CM | POA: Diagnosis present

## 2019-04-08 DIAGNOSIS — R1110 Vomiting, unspecified: Secondary | ICD-10-CM | POA: Diagnosis not present

## 2019-04-08 DIAGNOSIS — R4189 Other symptoms and signs involving cognitive functions and awareness: Secondary | ICD-10-CM | POA: Diagnosis present

## 2019-04-08 DIAGNOSIS — K6389 Other specified diseases of intestine: Secondary | ICD-10-CM | POA: Diagnosis not present

## 2019-04-08 DIAGNOSIS — Z89421 Acquired absence of other right toe(s): Secondary | ICD-10-CM | POA: Diagnosis not present

## 2019-04-08 DIAGNOSIS — R404 Transient alteration of awareness: Secondary | ICD-10-CM | POA: Diagnosis not present

## 2019-04-08 DIAGNOSIS — K560 Paralytic ileus: Secondary | ICD-10-CM | POA: Diagnosis present

## 2019-04-08 DIAGNOSIS — M6281 Muscle weakness (generalized): Secondary | ICD-10-CM | POA: Diagnosis not present

## 2019-04-08 DIAGNOSIS — M1990 Unspecified osteoarthritis, unspecified site: Secondary | ICD-10-CM | POA: Diagnosis present

## 2019-04-08 DIAGNOSIS — E118 Type 2 diabetes mellitus with unspecified complications: Secondary | ICD-10-CM | POA: Diagnosis not present

## 2019-04-08 DIAGNOSIS — E785 Hyperlipidemia, unspecified: Secondary | ICD-10-CM | POA: Diagnosis not present

## 2019-04-08 DIAGNOSIS — I739 Peripheral vascular disease, unspecified: Secondary | ICD-10-CM | POA: Diagnosis not present

## 2019-04-08 DIAGNOSIS — Z23 Encounter for immunization: Secondary | ICD-10-CM

## 2019-04-08 DIAGNOSIS — Z7401 Bed confinement status: Secondary | ICD-10-CM | POA: Diagnosis not present

## 2019-04-08 DIAGNOSIS — R1011 Right upper quadrant pain: Secondary | ICD-10-CM

## 2019-04-08 DIAGNOSIS — L853 Xerosis cutis: Secondary | ICD-10-CM | POA: Diagnosis not present

## 2019-04-08 DIAGNOSIS — K3189 Other diseases of stomach and duodenum: Secondary | ICD-10-CM | POA: Diagnosis present

## 2019-04-08 DIAGNOSIS — I252 Old myocardial infarction: Secondary | ICD-10-CM

## 2019-04-08 DIAGNOSIS — Z7982 Long term (current) use of aspirin: Secondary | ICD-10-CM | POA: Diagnosis not present

## 2019-04-08 DIAGNOSIS — Z8249 Family history of ischemic heart disease and other diseases of the circulatory system: Secondary | ICD-10-CM

## 2019-04-08 DIAGNOSIS — F329 Major depressive disorder, single episode, unspecified: Secondary | ICD-10-CM | POA: Diagnosis not present

## 2019-04-08 DIAGNOSIS — R197 Diarrhea, unspecified: Secondary | ICD-10-CM | POA: Diagnosis not present

## 2019-04-08 DIAGNOSIS — E1142 Type 2 diabetes mellitus with diabetic polyneuropathy: Secondary | ICD-10-CM | POA: Diagnosis not present

## 2019-04-08 DIAGNOSIS — R109 Unspecified abdominal pain: Secondary | ICD-10-CM | POA: Diagnosis not present

## 2019-04-08 DIAGNOSIS — D649 Anemia, unspecified: Secondary | ICD-10-CM | POA: Diagnosis present

## 2019-04-08 DIAGNOSIS — E11618 Type 2 diabetes mellitus with other diabetic arthropathy: Secondary | ICD-10-CM | POA: Diagnosis not present

## 2019-04-08 DIAGNOSIS — R0902 Hypoxemia: Secondary | ICD-10-CM | POA: Diagnosis not present

## 2019-04-08 LAB — URINALYSIS, ROUTINE W REFLEX MICROSCOPIC
Bilirubin Urine: NEGATIVE
Glucose, UA: NEGATIVE mg/dL
Ketones, ur: NEGATIVE mg/dL
Nitrite: NEGATIVE
Protein, ur: NEGATIVE mg/dL
Specific Gravity, Urine: >1.046 — ABNORMAL HIGH (ref 1.005–1.030)
WBC, UA: >50 WBC/hpf — ABNORMAL HIGH (ref 0–5)
pH: 5 (ref 5.0–8.0)

## 2019-04-08 LAB — CBC
HCT: 40.8 % (ref 39.0–52.0)
Hemoglobin: 12.8 g/dL — ABNORMAL LOW (ref 13.0–17.0)
MCH: 28.5 pg (ref 26.0–34.0)
MCHC: 31.4 g/dL (ref 30.0–36.0)
MCV: 90.9 fL (ref 80.0–100.0)
Platelets: 191 10*3/uL (ref 150–400)
RBC: 4.49 MIL/uL (ref 4.22–5.81)
RDW: 13.8 % (ref 11.5–15.5)
WBC: 10.2 10*3/uL (ref 4.0–10.5)
nRBC: 0 % (ref 0.0–0.2)

## 2019-04-08 LAB — COMPREHENSIVE METABOLIC PANEL
ALT: 22 U/L (ref 0–44)
AST: 19 U/L (ref 15–41)
Albumin: 3.9 g/dL (ref 3.5–5.0)
Alkaline Phosphatase: 102 U/L (ref 38–126)
Anion gap: 10 (ref 5–15)
BUN: 22 mg/dL (ref 8–23)
CO2: 25 mmol/L (ref 22–32)
Calcium: 8.4 mg/dL — ABNORMAL LOW (ref 8.9–10.3)
Chloride: 105 mmol/L (ref 98–111)
Creatinine, Ser: 1.07 mg/dL (ref 0.61–1.24)
GFR calc Af Amer: >60 mL/min (ref >60)
GFR calc non Af Amer: >60 mL/min (ref >60)
Glucose, Bld: 308 mg/dL — ABNORMAL HIGH (ref 70–99)
Potassium: 3 mmol/L — ABNORMAL LOW (ref 3.5–5.1)
Sodium: 140 mmol/L (ref 135–145)
Total Bilirubin: 0.5 mg/dL (ref 0.3–1.2)
Total Protein: 7.9 g/dL (ref 6.5–8.1)

## 2019-04-08 LAB — HEMOGLOBIN A1C
Hgb A1c MFr Bld: 7.7 % — ABNORMAL HIGH (ref 4.8–5.6)
Mean Plasma Glucose: 174.29 mg/dL

## 2019-04-08 LAB — CBG MONITORING, ED: Glucose-Capillary: 337 mg/dL — ABNORMAL HIGH (ref 70–99)

## 2019-04-08 LAB — LIPASE, BLOOD: Lipase: 16 U/L (ref 11–51)

## 2019-04-08 LAB — GLUCOSE, CAPILLARY: Glucose-Capillary: 187 mg/dL — ABNORMAL HIGH (ref 70–99)

## 2019-04-08 MED ORDER — CIPROFLOXACIN IN D5W 400 MG/200ML IV SOLN
400.0000 mg | Freq: Once | INTRAVENOUS | Status: AC
  Administered 2019-04-08: 400 mg via INTRAVENOUS
  Filled 2019-04-08: qty 200

## 2019-04-08 MED ORDER — INSULIN GLARGINE 100 UNIT/ML ~~LOC~~ SOLN
24.0000 [IU] | Freq: Every day | SUBCUTANEOUS | Status: DC
  Administered 2019-04-09 – 2019-04-13 (×5): 24 [IU] via SUBCUTANEOUS
  Filled 2019-04-08 (×5): qty 0.24

## 2019-04-08 MED ORDER — FLUTICASONE PROPIONATE 50 MCG/ACT NA SUSP
2.0000 | Freq: Every day | NASAL | Status: DC
  Administered 2019-04-09 – 2019-04-12 (×4): 2 via NASAL
  Filled 2019-04-08: qty 16

## 2019-04-08 MED ORDER — VITAMIN D3 25 MCG (1000 UNIT) PO TABS
2000.0000 [IU] | ORAL_TABLET | Freq: Every day | ORAL | Status: DC
  Administered 2019-04-09 – 2019-04-13 (×5): 2000 [IU] via ORAL
  Filled 2019-04-08 (×10): qty 2

## 2019-04-08 MED ORDER — ACETAMINOPHEN 325 MG PO TABS
650.0000 mg | ORAL_TABLET | Freq: Four times a day (QID) | ORAL | Status: DC | PRN
  Administered 2019-04-10 – 2019-04-12 (×3): 650 mg via ORAL
  Filled 2019-04-08 (×3): qty 2

## 2019-04-08 MED ORDER — PNEUMOCOCCAL VAC POLYVALENT 25 MCG/0.5ML IJ INJ
0.5000 mL | INJECTION | INTRAMUSCULAR | Status: DC
  Filled 2019-04-08: qty 0.5

## 2019-04-08 MED ORDER — MELATONIN 5 MG PO TABS
10.0000 mg | ORAL_TABLET | Freq: Every day | ORAL | Status: DC
  Administered 2019-04-09 – 2019-04-12 (×3): 10 mg via ORAL
  Filled 2019-04-08 (×5): qty 2

## 2019-04-08 MED ORDER — INSULIN GLARGINE 100 UNIT/ML ~~LOC~~ SOLN
18.0000 [IU] | Freq: Every day | SUBCUTANEOUS | Status: DC
  Administered 2019-04-09 – 2019-04-12 (×4): 18 [IU] via SUBCUTANEOUS
  Filled 2019-04-08 (×6): qty 0.18

## 2019-04-08 MED ORDER — PANTOPRAZOLE SODIUM 40 MG PO TBEC
40.0000 mg | DELAYED_RELEASE_TABLET | Freq: Every day | ORAL | Status: DC
  Administered 2019-04-09 – 2019-04-13 (×5): 40 mg via ORAL
  Filled 2019-04-08 (×5): qty 1

## 2019-04-08 MED ORDER — SODIUM CHLORIDE (PF) 0.9 % IJ SOLN
INTRAMUSCULAR | Status: AC
  Filled 2019-04-08: qty 50

## 2019-04-08 MED ORDER — INFLUENZA VAC A&B SA ADJ QUAD 0.5 ML IM PRSY
0.5000 mL | PREFILLED_SYRINGE | INTRAMUSCULAR | Status: AC
  Administered 2019-04-11: 0.5 mL via INTRAMUSCULAR
  Filled 2019-04-08: qty 0.5

## 2019-04-08 MED ORDER — POTASSIUM CHLORIDE CRYS ER 20 MEQ PO TBCR: 40.0000 meq | EXTENDED_RELEASE_TABLET | Freq: Once | ORAL | Status: DC

## 2019-04-08 MED ORDER — METRONIDAZOLE IN NACL 5-0.79 MG/ML-% IV SOLN
500.0000 mg | Freq: Once | INTRAVENOUS | Status: AC
  Administered 2019-04-08: 500 mg via INTRAVENOUS
  Filled 2019-04-08: qty 100

## 2019-04-08 MED ORDER — CHLORHEXIDINE GLUCONATE CLOTH 2 % EX PADS
6.0000 | MEDICATED_PAD | Freq: Every day | CUTANEOUS | Status: DC
  Administered 2019-04-11: 6 via TOPICAL

## 2019-04-08 MED ORDER — TAMSULOSIN HCL 0.4 MG PO CAPS
0.8000 mg | ORAL_CAPSULE | Freq: Every evening | ORAL | Status: DC
  Administered 2019-04-10 – 2019-04-12 (×3): 0.8 mg via ORAL
  Filled 2019-04-08 (×4): qty 2

## 2019-04-08 MED ORDER — TRAZODONE HCL 50 MG PO TABS
50.0000 mg | ORAL_TABLET | Freq: Every day | ORAL | Status: DC
  Administered 2019-04-09 – 2019-04-12 (×3): 50 mg via ORAL
  Filled 2019-04-08 (×4): qty 1

## 2019-04-08 MED ORDER — SERTRALINE HCL 50 MG PO TABS
150.0000 mg | ORAL_TABLET | Freq: Every day | ORAL | Status: DC
  Administered 2019-04-09 – 2019-04-12 (×3): 150 mg via ORAL
  Filled 2019-04-08 (×4): qty 1

## 2019-04-08 MED ORDER — ACETAMINOPHEN 650 MG RE SUPP: 650.0000 mg | Freq: Four times a day (QID) | RECTAL | Status: DC | PRN

## 2019-04-08 MED ORDER — POTASSIUM CHLORIDE 10 MEQ/100ML IV SOLN
10.0000 meq | Freq: Once | INTRAVENOUS | Status: AC
  Administered 2019-04-08: 10 meq via INTRAVENOUS
  Filled 2019-04-08: qty 100

## 2019-04-08 MED ORDER — ENOXAPARIN SODIUM 40 MG/0.4ML ~~LOC~~ SOLN
40.0000 mg | Freq: Every day | SUBCUTANEOUS | Status: DC
  Administered 2019-04-09 – 2019-04-12 (×4): 40 mg via SUBCUTANEOUS
  Filled 2019-04-08 (×6): qty 0.4

## 2019-04-08 MED ORDER — IOHEXOL 300 MG/ML  SOLN
100.0000 mL | Freq: Once | INTRAMUSCULAR | Status: AC | PRN
  Administered 2019-04-08: 16:00:00 100 mL via INTRAVENOUS

## 2019-04-08 MED ORDER — INSULIN ASPART 100 UNIT/ML ~~LOC~~ SOLN
0.0000 [IU] | Freq: Three times a day (TID) | SUBCUTANEOUS | Status: DC
  Administered 2019-04-09: 2 [IU] via SUBCUTANEOUS
  Administered 2019-04-09 (×2): 1 [IU] via SUBCUTANEOUS
  Administered 2019-04-10: 2 [IU] via SUBCUTANEOUS
  Administered 2019-04-10: 1 [IU] via SUBCUTANEOUS
  Administered 2019-04-10: 2 [IU] via SUBCUTANEOUS
  Administered 2019-04-11 (×2): 3 [IU] via SUBCUTANEOUS
  Administered 2019-04-12: 1 [IU] via SUBCUTANEOUS
  Administered 2019-04-12: 2 [IU] via SUBCUTANEOUS
  Administered 2019-04-13: 1 [IU] via SUBCUTANEOUS
  Administered 2019-04-13: 2 [IU] via SUBCUTANEOUS
  Filled 2019-04-08: qty 0.09

## 2019-04-08 NOTE — ED Notes (Signed)
Pt transported off floor CT

## 2019-04-08 NOTE — ED Notes (Addendum)
Pt A/O X1 to person Hx dementia. O2sat 99% room air no signs of distress. VSS. Bowel sounds present. Audible gurgling sounds present. Pt denies pain or emesis.

## 2019-04-08 NOTE — ED Notes (Signed)
Pt made aware need of urine sample. Wife at bedside. Pt provided warm blanket.

## 2019-04-08 NOTE — ED Triage Notes (Signed)
Patient BIB EMS from [facility]) with c/o abdominal pain. Hx of Adynamic ileus, with frequent bowel obstructions. Patient also has hx of dementia. Staff reports distension over the past couple of days with associated pain. Per staff, today patient denies pain. CBG= 400 for EMS, pt has history of diabetes. Patient DNR - golden ticket at bedside.   20G Left FA PIV placed by EMS- 52mL IV NS administered by EMS.  EMS VS: T 97.5, SPO2 96% 2LNC (patient does not usually require oxygen), 121/74, 80HR, CBG=400.

## 2019-04-08 NOTE — H&P (Signed)
History and Physical    Alan [name]. RCV:[id number] DOB: 24-Dec-1945 DOA: 04/08/2019  PCP: Hendricks Limes, MD  Patient coming from: SNF  Chief Complaint: Abdominal pain, diarrhea  HPI: Alan [name]. is a 73 y.o. male with medical history significant of dementia, pad, cerebral aneurysm, diabetes mellitus on insulin, stroke, MI, alcohol abuse, ileus.  Patient has cognitive impairment and is unable to provide a good history. History provided by his wife. Patient presented secondary to abdominal pain and significant diarrhea.  Diarrhea has started from the last few days.  Patient has a history of C. difficile colitis with multiple treatments.  Recently tested negative in the nursing facility.  Abdominal pain started yesterday and located mainly in the left side of his abdomen.  Unknown if anything was given for treatment.    ED Course: Vitals: T-max is 98.1 F, pulse of 75, respirations of 16, blood pressure 122/67, currently on room air Labs: Potassium of 3.0, hemoglobin of 12.8, blood sugar of 308 Imaging: CT abdomen pelvis significant for pneumatosis of the gastric wall with need to exclude emphysematous gastritis, diffuse air distention of colon concerning for ileus, air within the urinary bladder Medications/Course: Cipro, Flagyl  Review of Systems: Review of Systems  Respiratory: Negative for shortness of breath.   Cardiovascular: Negative for chest pain.  Gastrointestinal: Positive for abdominal pain, diarrhea and nausea. Negative for blood in stool, constipation, melena and vomiting.  All other systems reviewed and are negative.   Past Medical History:  Diagnosis Date  . Anemia   . Arthritis   . Dementia ([place])   . DM (diabetes mellitus), type 2 with peripheral vascular complications ([place])   . ETOH abuse   . Myocardial infarction ([place])   . Peripheral arterial disease ([place])   . Stroke [facility])     Past Surgical History:  Procedure Laterality Date  . ABDOMINAL  AORTOGRAM N/A 10/25/2016   Procedure: Abdominal Aortogram;  Surgeon: Elam Dutch, MD;  Location: [facility];  Service: Cardiovascular;  Laterality: N/A;  . BRAIN SURGERY    . COLONOSCOPY  2015   Negative, performed by GI in [place]  . CORONARY STENT PLACEMENT     " about 25 years ago (10/29/16)  . ENDARTERECTOMY FEMORAL Right 10/30/2016   Procedure: Wendall Papa WITH PATCH GRAFT;  Surgeon: Elam Dutch, MD;  Location: [facility];  Service: Vascular;  Laterality: Right;  . IR NEPHRO TUBE REMOV/FL  07/04/2017  . IR NEPHRO TUBE REMOV/FL  07/07/2017  . IR NEPHROSTOMY EXCHANGE LEFT  06/20/2017  . IR NEPHROSTOMY EXCHANGE RIGHT  06/20/2017  . IR NEPHROSTOMY PLACEMENT LEFT  06/12/2017  . IR NEPHROSTOMY PLACEMENT RIGHT  06/12/2017  . IR URETERAL STENT PLACEMENT EXISTING ACCESS LEFT  06/20/2017  . IR URETERAL STENT PLACEMENT EXISTING ACCESS RIGHT  06/20/2017  . LOWER EXTREMITY ANGIOGRAPHY Right 10/25/2016   Procedure: Lower Extremity Angiography;  Surgeon: Elam Dutch, MD;  Location: [facility];  Service: Cardiovascular;  Laterality: Right;     reports that he has quit smoking. His smoking use included cigarettes. He has never used smokeless tobacco. He reports that he does not drink alcohol or use drugs.  Allergies  Allergen Reactions  . Penicillins Rash    Has patient had a PCN reaction causing immediate rash, facial/tongue/throat swelling, SOB or lightheadedness with hypotension: Yes Has patient had a PCN reaction causing severe rash involving mucus membranes or skin necrosis: No Has patient had a PCN reaction that  required hospitalization No Has patient had a PCN reaction occurring within the last 10 years: No If all of the above answers are "NO", then may proceed with Cephalosporin use.     Family History  Problem Relation Age of Onset  . Diabetes Mother   . Heart disease Father   . Heart disease Brother    Prior to Admission medications    Medication Sig Start Date End Date Taking? Authorizing Provider  Cholecalciferol (QC VITAMIN D3) 2000 units TABS Take 2,000 Units by mouth daily.    Yes [provider]  fluticasone (FLONASE) 50 MCG/ACT nasal spray Place 2 sprays into both nostrils daily.   Yes [provider]  geriatric multivitamins-minerals (ELDERTONIC/GEVRABON) LIQD Take 15 mLs by mouth 2 (two) times daily.   Yes [provider]  insulin aspart (NOVOLOG) 100 UNIT/ML injection Inject 2-10 Units into the skin See admin instructions. If BS is 1-150 0U 151-200 2U 201-250 4U 251-300 6U 301-400 10U 400 CALL MD   Yes [provider]  insulin glargine (LANTUS) 100 UNIT/ML injection Inject 18-24 Units into the skin See admin instructions. Use 18 units at bedtime & 24 units in morning 06/11/16  Yes [provider]  KETOCONAZOLE, TOPICAL, 1 % SHAM Apply 1 application topically 2 (two) times a week. Monday and thursday   Yes [provider]  Melatonin 10 MG TABS Take 10 mg by mouth at bedtime.   Yes [provider]  pantoprazole (PROTONIX) 40 MG tablet Take 1 tablet (40 mg total) by mouth daily. 03/28/19  Yes Cathren Laine, MD  Probiotic Product (RISA-BID PROBIOTIC PO) Take 1 capsule by mouth 2 (two) times daily.   Yes [provider]  Psyllium (METAMUCIL FIBER) 51.7 % PACK Take 1 Package by mouth 2 (two) times daily.   Yes [provider]  sertraline (ZOLOFT) 50 MG tablet Take 150 mg by mouth at bedtime.    Yes [provider]  Skin Protectants, Misc. (CALAZIME SKIN PROTECTANT EX) Apply 1 application topically 2 (two) times daily.   Yes [provider]  tamsulosin (FLOMAX) 0.4 MG CAPS capsule Take 0.8 mg by mouth every evening.    Yes [provider]  traZODone (DESYREL) 50 MG tablet Take 50 mg by mouth at bedtime.  09/11/16  Yes [provider]    Physical Exam:  Physical Exam Constitutional:      General: He is not  in acute distress.    Appearance: He is well-developed. He is not diaphoretic.  HENT:     Right Ear: Decreased hearing noted.     Left Ear: Decreased hearing noted.  Eyes:     Conjunctiva/sclera: Conjunctivae normal.     Pupils: Pupils are equal, round, and reactive to light.  Neck:     Musculoskeletal: Normal range of motion.  Cardiovascular:     Rate and Rhythm: Normal rate and regular rhythm.     Heart sounds: Normal heart sounds. No murmur.  Pulmonary:     Effort: Pulmonary effort is normal. No respiratory distress.     Breath sounds: Normal breath sounds. No wheezing or rales.  Abdominal:     General: Bowel sounds are increased. There is distension.     Palpations: Abdomen is soft.     Tenderness: There is no abdominal tenderness. There is no guarding or rebound.  Musculoskeletal: Normal range of motion.        General: No tenderness.  Lymphadenopathy:     Cervical: No cervical adenopathy.  Skin:    General: Skin is warm and dry.  Neurological:     Mental Status: He is alert.     Labs on Admission: I have personally reviewed following labs and imaging studies  CBC: Recent Labs  Lab 04/08/19 1410  WBC 10.2  HGB 12.8*  HCT 40.8  MCV 90.9  PLT 191    Basic Metabolic Panel: Recent Labs  Lab 04/08/19 1410  NA 140  K 3.0*  CL 105  CO2 25  GLUCOSE 308*  BUN 22  CREATININE 1.07  CALCIUM 8.4*    GFR: CrCl cannot be calculated (Unknown ideal weight.).  Liver Function Tests: Recent Labs  Lab 04/08/19 1410  AST 19  ALT 22  ALKPHOS 102  BILITOT 0.5  PROT 7.9  ALBUMIN 3.9   Recent Labs  Lab 04/08/19 1410  LIPASE 16   No results for input(s): AMMONIA in the last 168 hours.  Coagulation Profile: No results for input(s): INR, PROTIME in the last 168 hours.  Cardiac Enzymes: No results for input(s): CKTOTAL, CKMB, CKMBINDEX, TROPONINI in the last 168 hours.  BNP (last 3 results) No results for input(s): PROBNP in the last 8760 hours.  HbA1C:  No results for input(s): HGBA1C in the last 72 hours.  CBG: Recent Labs  Lab 04/08/19 1334  GLUCAP 337*    Lipid Profile: No results for input(s): CHOL, HDL, LDLCALC, TRIG, CHOLHDL, LDLDIRECT in the last 72 hours.  Thyroid Function Tests: No results for input(s): TSH, T4TOTAL, FREET4, T3FREE, THYROIDAB in the last 72 hours.  Anemia Panel: No results for input(s): VITAMINB12, FOLATE, FERRITIN, TIBC, IRON, RETICCTPCT in the last 72 hours.  Urine analysis:    Component Value Date/Time   COLORURINE YELLOW 12/26/2017 1035   APPEARANCEUR CLEAR 12/26/2017 1035   LABSPEC 1.028 12/26/2017 1035   PHURINE 5.0 12/26/2017 1035   GLUCOSEU >=500 (A) 12/26/2017 1035   HGBUR NEGATIVE 12/26/2017 1035   BILIRUBINUR NEGATIVE 12/26/2017 1035   KETONESUR 80 (A) 12/26/2017 1035   PROTEINUR 100 (A) 12/26/2017 1035   NITRITE NEGATIVE 12/26/2017 1035   LEUKOCYTESUR NEGATIVE 12/26/2017 1035     Radiological Exams on Admission: Ct Abdomen Pelvis W Contrast  Result Date: 04/08/2019 CLINICAL DATA:  73 year old male with audible coring. Patient denies pain or vomiting. EXAM: CT ABDOMEN AND PELVIS WITH CONTRAST TECHNIQUE: Multidetector CT imaging of the abdomen and pelvis was performed using the standard protocol following bolus administration of intravenous contrast. CONTRAST:  OMNIPAQUE IOHEXOL 300 MG/ML  SOLN COMPARISON:  CT of the abdomen pelvis dated 03/28/2019 FINDINGS: Lower chest: Bibasilar interstitial coarsening and scarring. There is coronary vascular calcification. No intraperitoneal free air. No free fluid. Hepatobiliary: The liver is unremarkable. There is mild mass effect and compression of the anterior liver by the distended interposed loop of colon. No intrahepatic biliary ductal dilatation. The gallbladder is unremarkable. Pancreas: Unremarkable. No pancreatic ductal dilatation or surrounding inflammatory changes. Spleen: Normal in size without focal abnormality. Adrenals/Urinary  Tract: The right adrenal gland is unremarkable. Subcentimeter low attenuating nodule in the lateral limb of the left adrenal gland is not characterized but may represent an adenoma. There is no hydronephrosis on either side. There is cortical irregularity and scarring. There is symmetric enhancement and excretion of contrast by both kidneys. The visualized ureters appear unremarkable. The urinary bladder is partially distended and grossly unremarkable. Air within the urinary bladder may be introduced via recent catheterization. Clinical correlation is recommended. Stomach/Bowel: There is diffuse air distention of the colon similar to  prior CT and measuring up to 7.7 cm in diameter. Liquid content within the colon compatible with diarrheal state. Clinical correlation is recommended. Small pockets of air along the wall of the distal ascending colon (series 4 image 57 and 59) as well as small pockets of air along the wall of the transverse colon (axial series 2 image 45, 48, 50, and 53 are likely within colonic fold or small colonic diverticula. These less likely represent extraluminal air. There is however intraluminal air within the wall of the stomach consistent with pneumatosis. This is of indeterminate etiology or clinical significance. Small amount of air within several small vessels along the inferior aspect of the greater curvature of the body of the stomach likely within the gastro-omental venous branches. No portal venous gas identified. There is no evidence of bowel obstruction. The appendix is normal. Vascular/Lymphatic: Advanced aortoiliac atherosclerotic disease. The origins of the celiac axis, SMA, and IMA are patent. The IVC is unremarkable. The SMV, splenic vein, and main portal vein are patent. No portal venous gas. There is no adenopathy. Reproductive: The prostate and seminal vesicles are grossly unremarkable. No pelvic mass Other: None Musculoskeletal: Osteopenia with degenerative changes of the  spine. L1 vertebroplasty. No acute osseous pathology. IMPRESSION: 1. Pneumatosis of the gastric wall of indeterminate clinical significance or etiology. Clinical correlation is recommended to exclude emphysematous gastritis. 2. Diffuse air distention of the colon similar to prior CT, likely colonic ileus. Liquid content within the colon compatible with diarrheal state. No bowel obstruction. Normal appendix. 3. Small scattered pockets of air along the wall of the colon likely within colonic folds or small colonic diverticula. These less likely represent extraluminal air. 4. Air within the urinary bladder may be introduced via recent catheterization. Clinical correlation is recommended. 5. Aortic Atherosclerosis (ICD10-I70.0). These results were called by telephone at the time of interpretation on 04/08/2019 at 4:30 pm to provider Lorre NickANTHONY ALLEN , who verbally acknowledged these results. Electronically Signed   By: Elgie CollardArash  Radparvar M.D.   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  General surgery consulted and is recommending observation at this time with IV antibiotics.  Consideration of exploratory laparotomy if symptoms worsen.  History of recurrent C. difficile with current symptoms of abdominal pain and multiple watery stools in addition to findings of likely GI tract infection. -General surgery recommendations -Obtain C. difficile testing -Continue ciprofloxacin and metronidazole IV  Diabetes mellitus, type 2, insulin-dependent, hyperglycemia -Continue home regimen of Lantus 18 units at night and 24 units in the morning -SSI  Dementia Stable.  Patient is from a SNF. -Social work  consult for return to SNF when medically ready for discharge -Continue Zoloft  Insomnia -Continue trazodone and melatonin  History of recurrent C. Difficile infection -testing as mentioned above  History of adynamic ileus Appears to be unchanged. -Per surgery   DVT prophylaxis: Lovenox Code Status: DNR Family Communication: Wife at bedside Disposition Plan: Medical floor Consults called: General surgery by EDP Admission status: Inpatient   Jacquelin Hawkingalph Ledonna Dormer, MD [facility] 04/08/2019, 5:41 PM

## 2019-04-08 NOTE — ED Provider Notes (Signed)
[facility]-EMERGENCY DEPT Provider Note   CSN: [id number] Arrival date & time: 04/08/19  1325     History   Chief Complaint Chief Complaint  Patient presents with  . Abdominal Pain    HPI Alan SeedsGlenn A Scism Jr. is a 73 y.o. male.     73 year old male with history of mild dementia presents with abdominal distention times several days.  History of adynamic ileus and bowel obstructions.  Emesis 2 days ago but nothing today.  No fever or chills.  No diarrhea.  Patient denies any abdominal pain at this time.  Here for further evaluation     Past Medical History:  Diagnosis Date  . Anemia   . Arthritis   . Dementia (HCC)   . DM (diabetes mellitus), type 2 with peripheral vascular complications (HCC)   . ETOH abuse   . Myocardial infarction (HCC)   . Peripheral arterial disease (HCC)   . Stroke [facility](HCC)     Patient Active Problem List   Diagnosis Date Noted  . C. difficile colitis 05/07/2018  . Neurocognitive deficits 02/26/2018  . Dysphagia 02/26/2018  . Klebsiella sepsis (HCC) 12/28/2017  . Uncontrolled type 2 diabetes mellitus with hyperglycemia, with long-term current use of insulin (HCC) 12/27/2017  . DKA, type 2 (HCC) 12/26/2017  . Sepsis due to undetermined organism (HCC) 12/26/2017  . 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 Urinary tract infection associated with nephrostomy catheter (HCC) 07/07/2017  . Acute urinary retention 07/07/2017  . Acute metabolic encephalopathy 07/07/2017  . Ureteral calculus, left   . Urinary tract infection due to Klebsiella species   . Sepsis (HCC) 07/02/2017  . Candida tropicalis infection 06/20/2017  . Bilateral nephrolithiasis 06/20/2017  . History of nephrostomy   . Candidemia (HCC) 06/15/2017  . Obstruction of kidney   . 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 ABDOMINAL AORTOGRAM N/A 10/25/2016   Procedure: Abdominal Aortogram;  Surgeon: Sherren KernsFields, Charles E, MD;  [facility];  Service: Cardiovascular;  Laterality: N/A;  . BRAIN SURGERY    . COLONOSCOPY  2015   [place]  . CORONARY STENT PLACEMENT     " about 25 years ago (10/29/16)  . ENDARTERECTOMY FEMORAL Right 10/30/2016   Procedure: Eduardo OsierENDARTERECTOMY FEMORAL-RIGHT WITH PATCH GRAFT;  Surgeon: Sherren KernsFields, Charles E, MD;  [facility];  Service: Vascular;  Laterality: Right;  . IR NEPHRO TUBE REMOV/FL  07/04/2017  . IR NEPHRO TUBE REMOV/FL  07/07/2017  . IR NEPHROSTOMY EXCHANGE LEFT  06/20/2017  . IR NEPHROSTOMY EXCHANGE RIGHT  06/20/2017  . IR NEPHROSTOMY PLACEMENT LEFT  06/12/2017  . IR NEPHROSTOMY PLACEMENT RIGHT  06/12/2017  . IR URETERAL STENT PLACEMENT EXISTING ACCESS LEFT  06/20/2017  . IR URETERAL STENT PLACEMENT EXISTING ACCESS RIGHT  06/20/2017  . LOWER EXTREMITY ANGIOGRAPHY Right 10/25/2016   Procedure: Lower Extremity Angiography;  Surgeon: Sherren KernsFields, Charles E, MD;  [facility];  Service: Cardiovascular;  Laterality: Right;        Home Medications    Prior to Admission medications   Medication Sig Start Date End Date Taking? Authorizing Provider  acetaminophen (TYLENOL) 500 MG tablet Take 500 mg by mouth every 6 (six) hours as needed for mild pain.    [provider]  alendronate (FOSAMAX) 70 MG tablet Take 70 mg by mouth every  Sunday. Take with a full glass of water on an empty stomach.     [provider]  aspirin EC 81 MG tablet Take 81 mg by mouth every morning.     [provider]  atorvastatin (LIPITOR) 10 MG tablet Take 10 mg by mouth at bedtime.     [provider]  busPIRone (BUSPAR) 10 MG tablet Take 10 mg by mouth 2 (two) times daily.    [provider]  Cholecalciferol (QC VITAMIN D3) 2000 units TABS Take 2,000 Units by mouth daily.     [provider]  fluticasone (FLONASE) 50 MCG/ACT nasal spray Place 2 sprays into both nostrils daily.    [provider]  geriatric multivitamins-minerals (ELDERTONIC/GEVRABON) LIQD Take 15 mLs by mouth 2 (two) times daily.    [provider]  hydroxypropyl methylcellulose / hypromellose (ISOPTO TEARS / GONIOVISC) 2.5 % ophthalmic solution Place 1 drop into both eyes 2 (two) times daily.    [provider]  insulin aspart (NOVOLOG) 100 UNIT/ML injection Inject 2-10 Units into the skin 4 (four) times daily.     [provider]  insulin glargine (LANTUS) 100 UNIT/ML injection Inject 18 Units into the skin at bedtime.  06/11/16   [provider]  KETOCONAZOLE, TOPICAL, 1 % SHAM Apply 1 application topically 2 (two) times a week. Monday and thursday    [provider]  LORazepam (ATIVAN) 1 MG tablet Take 0.5 tablets (0.5 mg total) by mouth 2 (two) times daily. 08/24/18   Wille Celeste, PA-C  Melatonin 10 MG TABS Take 10 mg by mouth at bedtime.    [provider]  pantoprazole (PROTONIX) 40 MG tablet Take 1 tablet (40 mg total) by mouth daily. 03/28/19   Lajean Saver, MD  Probiotic Product (RISA-BID PROBIOTIC PO) Take 1 capsule by mouth 2 (two) times daily.    [provider]  Psyllium (METAMUCIL FIBER) 51.7 % PACK Take 1 Package by mouth 2 (two) times daily.    [provider]  sertraline (ZOLOFT) 50 MG tablet Take 150 mg by mouth at bedtime.     [provider]  tamsulosin (FLOMAX) 0.4 MG CAPS capsule Take 0.8 mg by mouth every evening.     [provider]  traZODone (DESYREL) 50 MG tablet Take 50 mg by mouth at bedtime.  09/11/16   [provider]    Family History Family History  Problem Relation Age of Onset  . Diabetes Mother   . Heart disease Father   . Heart disease Brother     Social History Social History   Tobacco Use  . Smoking status: Former Smoker    Types: Cigarettes  .  Smokeless tobacco: Never Used  . Tobacco comment: Quit smoking cigarettes 10/ 2017  Substance Use Topics  . Alcohol use: No    Comment: Alcohol abuse PMH  . Drug use: No     Allergies   Penicillins   Review of Systems Review of Systems  All other systems reviewed and are negative.    Physical Exam Updated Vital Signs BP 127/76 (BP Location: Left Arm)   Pulse 81   Temp 97.7 F (36.5 C) (Oral)   Resp 18   Wt 75.3 kg   SpO2 100%   BMI 21.89 kg/m   Physical Exam Vitals signs and nursing note reviewed.  Constitutional:      General: He is not in acute distress.    Appearance: Normal appearance. He is well-developed. He  is not toxic-appearing.  HENT:     Head: Normocephalic and atraumatic.  Eyes:     General: Lids are normal.     Conjunctiva/sclera: Conjunctivae normal.     Pupils: Pupils are equal, round, and reactive to light.  Neck:     Musculoskeletal: Normal range of motion and neck supple.     Thyroid: No thyroid mass.     Trachea: No tracheal deviation.  Cardiovascular:     Rate and Rhythm: Normal rate and regular rhythm.     Heart sounds: Normal heart sounds. No murmur. No gallop.   Pulmonary:     Effort: Pulmonary effort is normal. No respiratory distress.     Breath sounds: Normal breath sounds. No stridor. No decreased breath sounds, wheezing, rhonchi or rales.  Abdominal:     General: Bowel sounds are normal. There is distension.     Palpations: Abdomen is soft.     Tenderness: There is generalized abdominal tenderness. There is no rebound.  Musculoskeletal: Normal range of motion.        General: No tenderness.  Skin:    General: Skin is warm and dry.     Findings: No abrasion or rash.  Neurological:     Mental Status: He is alert and oriented to person, place, and time.     GCS: GCS eye subscore is 4. GCS verbal subscore is 5. GCS motor subscore is 6.     Cranial Nerves: No cranial nerve deficit.     Sensory: No sensory deficit.  Psychiatric:         Speech: Speech normal.        Behavior: Behavior normal.      ED Treatments / Results  Labs (all labs ordered are listed, but only abnormal results are displayed) Labs Reviewed  CBG MONITORING, ED - Abnormal; Notable for the following components:      Result Value   Glucose-Capillary 337 (*)    All other components within normal limits  LIPASE, BLOOD  COMPREHENSIVE METABOLIC PANEL  CBC  URINALYSIS, ROUTINE W REFLEX MICROSCOPIC    EKG None  Radiology No results found.  Procedures Procedures (including critical care time)  Medications Ordered in ED Medications - No data to display   Initial Impression / Assessment and Plan / ED Course  I have reviewed the triage vital signs and the nursing notes.  Pertinent labs & imaging results that were available during my care of the patient were reviewed by me and considered in my medical decision making (see chart for details).        Patient with mild hypokalemia treated with IV potassium.  Abdominal CT shows pneumatosis and gastric wall.  Discussed with Dr. Carolynne Edouard from general surgery who recommends patient start on IV antibiotics and admitted for observation.  They will follow along.  Will consult hospitalist for admission  Final Clinical Impressions(s) / ED Diagnoses   Final diagnoses:  None    ED Discharge Orders    None       Lorre Nick, MD 04/08/19 1653

## 2019-04-08 NOTE — Consult Note (Signed)
Reason for Consult:pneumatosis Referring Physician: Dr. Harvie HeckAllen  Alan A Wonders Jr. is an 73 y.o. male.  HPI: The patient is a 73 year old white male who is a nursing home resident who states that he has not felt well for 3 to 4 months.  He has been in and out of the hospital with C. difficile colitis.  He is followed by [facility] gastroenterology.  The family returned him to the emergency department today because of some issues with diarrhea.  He denies any fevers or chills.  He denies any abdominal pain.  Past Medical History:  Diagnosis Date  . Anemia   . Arthritis   . Dementia (HCC)   . DM (diabetes mellitus), type 2 with peripheral vascular complications (HCC)   . ETOH abuse   . Myocardial infarction (HCC)   . Peripheral arterial disease (HCC)   . Stroke [facility](HCC)     Past Surgical History:  Procedure Laterality Date  . ABDOMINAL AORTOGRAM N/A 10/25/2016   Procedure: Abdominal Aortogram;  Surgeon: Sherren KernsFields, Charles E, MD;  [facility];  Service: Cardiovascular;  Laterality: N/A;  . BRAIN SURGERY    . COLONOSCOPY  2015   Negative[place]  . CORONARY STENT PLACEMENT     " about 25 years ago (10/29/16)  . ENDARTERECTOMY FEMORAL Right 10/30/2016   Procedure: Eduardo OsierENDARTERECTOMY FEMORAL-RIGHT WITH PATCH GRAFT;  Surgeon: Sherren KernsFields, Charles E, MD;  [facility];  Service: Vascular;  Laterality: Right;  . IR NEPHRO TUBE REMOV/FL  07/04/2017  . IR NEPHRO TUBE REMOV/FL  07/07/2017  . IR NEPHROSTOMY EXCHANGE LEFT  06/20/2017  . IR NEPHROSTOMY EXCHANGE RIGHT  06/20/2017  . IR NEPHROSTOMY PLACEMENT LEFT  06/12/2017  . IR NEPHROSTOMY PLACEMENT RIGHT  06/12/2017  . IR URETERAL STENT PLACEMENT EXISTING ACCESS LEFT  06/20/2017  . IR URETERAL STENT PLACEMENT EXISTING ACCESS RIGHT  06/20/2017  . LOWER EXTREMITY ANGIOGRAPHY Right 10/25/2016   Procedure: Lower Extremity Angiography;  Surgeon: Sherren KernsFields, Charles E, MD;  [facility];  Service: Cardiovascular;   Laterality: Right;    Family History  Problem Relation Age of Onset  . Diabetes Mother   . Heart disease Father   . Heart disease Brother     Social History:  reports that he has quit smoking. His smoking use included cigarettes. He has never used smokeless tobacco. He reports that he does not drink alcohol or use drugs.  Allergies:  Allergies  Allergen Reactions  . Penicillins Rash    Has patient had a PCN reaction causing immediate rash, facial/tongue/throat swelling, SOB or lightheadedness with hypotension: Yes Has patient had a PCN reaction causing severe rash involving mucus membranes or skin necrosis: No Has patient had a PCN reaction that required hospitalization No Has patient had a PCN reaction occurring within the last 10 years: No If all of the above answers are "NO", then may proceed with Cephalosporin use.     Medications: I have reviewed the patient's current medications.  Results for orders placed or performed during the hospital encounter of 04/08/19 (from the past 48 hour(s))  CBG monitoring, ED     Status: Abnormal   Collection Time: 04/08/19  1:34 PM  Result Value Ref Range   Glucose-Capillary 337 (H) 70 - 99 mg/dL  Lipase, blood     Status: None   Collection Time: 04/08/19  2:10 PM  Result Value Ref Range   Lipase 16 11 - 51 U/L    Comment: Performed at  [facility], [address] [place], [place] [address]  Comprehensive metabolic panel     Status: Abnormal   Collection Time: 04/08/19  2:10 PM  Result Value Ref Range   Sodium 140 135 - 145 mmol/L   Potassium 3.0 (L) 3.5 - 5.1 mmol/L   Chloride 105 98 - 111 mmol/L   CO2 25 22 - 32 mmol/L   Glucose, Bld 308 (H) 70 - 99 mg/dL   BUN 22 8 - 23 mg/dL   Creatinine, Ser 4.68 0.61 - 1.24 mg/dL   Calcium 8.4 (L) 8.9 - 10.3 mg/dL   Total Protein 7.9 6.5 - 8.1 g/dL   Albumin 3.9 3.5 - 5.0 g/dL   AST 19 15 - 41 U/L   ALT 22 0 - 44 U/L   Alkaline Phosphatase 102 38 - 126 U/L   Total  Bilirubin 0.5 0.3 - 1.2 mg/dL   GFR calc non Af Amer >60 >60 mL/min   GFR calc Af Amer >60 >60 mL/min   Anion gap 10 5 - 15    Comment: Performed at [facility], [address] [place], [place] [address]  CBC     Status: Abnormal   Collection Time: 04/08/19  2:10 PM  Result Value Ref Range   WBC 10.2 4.0 - 10.5 K/uL   RBC 4.49 4.22 - 5.81 MIL/uL   Hemoglobin 12.8 (L) 13.0 - 17.0 g/dL   HCT 24.8 25.0 - 03.7 %   MCV 90.9 80.0 - 100.0 fL   MCH 28.5 26.0 - 34.0 pg   MCHC 31.4 30.0 - 36.0 g/dL   RDW 04.8 88.9 - 16.9 %   Platelets 191 150 - 400 K/uL   nRBC 0.0 0.0 - 0.2 %    Comment: Performed at [facility], [address] [place], [place] [address]    Ct Abdomen Pelvis W Contrast  Result Date: 04/08/2019 CLINICAL DATA:  73 year old male with audible coring. Patient denies pain or vomiting. EXAM: CT ABDOMEN AND PELVIS WITH CONTRAST TECHNIQUE: Multidetector CT imaging of the abdomen and pelvis was performed using the standard protocol following bolus administration of intravenous contrast. CONTRAST:  OMNIPAQUE IOHEXOL 300 MG/ML  SOLN COMPARISON:  CT of the abdomen pelvis dated 03/28/2019 FINDINGS: Lower chest: Bibasilar interstitial coarsening and scarring. There is coronary vascular calcification. No intraperitoneal free air. No free fluid. Hepatobiliary: The liver is unremarkable. There is mild mass effect and compression of the anterior liver by the distended interposed loop of colon. No intrahepatic biliary ductal dilatation. The gallbladder is unremarkable. Pancreas: Unremarkable. No pancreatic ductal dilatation or surrounding inflammatory changes. Spleen: Normal in size without focal abnormality. Adrenals/Urinary Tract: The right adrenal gland is unremarkable. Subcentimeter low attenuating nodule in the lateral limb of the left adrenal gland is not characterized but may represent an adenoma. There is no hydronephrosis on either side. There is  cortical irregularity and scarring. There is symmetric enhancement and excretion of contrast by both kidneys. The visualized ureters appear unremarkable. The urinary bladder is partially distended and grossly unremarkable. Air within the urinary bladder may be introduced via recent catheterization. Clinical correlation is recommended. Stomach/Bowel: There is diffuse air distention of the colon similar to prior CT and measuring up to 7.7 cm in diameter. Liquid content within the colon compatible with diarrheal state. Clinical correlation is recommended. Small pockets of air along the wall of the distal ascending colon (series 4 image 57 and 59) as well as small pockets of air  along the wall of the transverse colon (axial series 2 image 45, 48, 50, and 53 are likely within colonic fold or small colonic diverticula. These less likely represent extraluminal air. There is however intraluminal air within the wall of the stomach consistent with pneumatosis. This is of indeterminate etiology or clinical significance. Small amount of air within several small vessels along the inferior aspect of the greater curvature of the body of the stomach likely within the gastro-omental venous branches. No portal venous gas identified. There is no evidence of bowel obstruction. The appendix is normal. Vascular/Lymphatic: Advanced aortoiliac atherosclerotic disease. The origins of the celiac axis, SMA, and IMA are patent. The IVC is unremarkable. The SMV, splenic vein, and main portal vein are patent. No portal venous gas. There is no adenopathy. Reproductive: The prostate and seminal vesicles are grossly unremarkable. No pelvic mass Other: None Musculoskeletal: Osteopenia with degenerative changes of the spine. L1 vertebroplasty. No acute osseous pathology. IMPRESSION: 1. Pneumatosis of the gastric wall of indeterminate clinical significance or etiology. Clinical correlation is recommended to exclude emphysematous gastritis. 2. Diffuse  air distention of the colon similar to prior CT, likely colonic ileus. Liquid content within the colon compatible with diarrheal state. No bowel obstruction. Normal appendix. 3. Small scattered pockets of air along the wall of the colon likely within colonic folds or small colonic diverticula. These less likely represent extraluminal air. 4. Air within the urinary bladder may be introduced via recent catheterization. Clinical correlation is recommended. 5. Aortic Atherosclerosis (ICD10-I70.0). These results were called by telephone at the time of interpretation on 04/08/2019 at 4:30 pm to provider Lorre Nick , who verbally acknowledged these results. Electronically Signed   By: Elgie Collard M.D.   On: 04/08/2019 16:36    Review of Systems  Constitutional: Positive for malaise/fatigue.  HENT: Negative.   Eyes: Negative.   Respiratory: Negative.   Cardiovascular: Negative.   Gastrointestinal: Positive for diarrhea and vomiting. Negative for abdominal pain.  Genitourinary: Negative.   Musculoskeletal: Negative.   Skin: Negative.   Neurological: Negative.   Endo/Heme/Allergies: Negative.   Psychiatric/Behavioral: Negative.    Blood pressure 116/75, pulse 78, temperature 98.1 F (36.7 C), temperature source Oral, resp. rate 16, weight 75.3 kg, SpO2 100 %. Physical Exam  Constitutional: He appears well-developed and well-nourished. No distress.  HENT:  Head: Normocephalic and atraumatic.  Mouth/Throat: No oropharyngeal exudate.  Eyes: Pupils are equal, round, and reactive to light. Conjunctivae and EOM are normal.  Neck: Normal range of motion. Neck supple.  Cardiovascular: Normal rate, regular rhythm and normal heart sounds.  Respiratory: Effort normal and breath sounds normal. No stridor. No respiratory distress.  GI: Soft. He exhibits distension. There is no abdominal tenderness.  Musculoskeletal: Normal range of motion.        General: No tenderness or deformity.  Neurological:   Demented but cooperative and answers questions  Skin: Skin is warm and dry. No rash noted.  Psychiatric:  dementia    Assessment/Plan: The patient does have a history of adynamic ileus and C. difficile colitis which is followed by gastroenterology.  A CT scan performed today in the emergency department also found some pneumatosis of the wall of the stomach.  Currently he has no fever and a normal white count and complains of no abdominal pain.  With these findings I would recommend treating him with close observation and broad-spectrum antibiotic therapy.  Should he worsen he would need exploration and given his underlying medical conditions he is certainly at  high risk of a serious complication.  He will be admitted to medicine to manage his multiple medical problems.  He should have a gastroenterology consult since they are following him for much of his underlying bowel problems.  We will follow closely with you.  Autumn Messing III 04/08/2019, 5:10 PM

## 2019-04-08 NOTE — ED Notes (Signed)
Pt had bowel movement on self and bed. Liquid stool. Robert NT assisted in cleaning pt.

## 2019-04-08 NOTE — Progress Notes (Signed)
Patient is found to have a stage 2 pressure wound on sacrum. Barrier cream and sacral foam applied. Wound care consult placed. C. Diff sample sent and pending. Followed C. Diff protocol and second RN Alan [name] verified stool sample. Will continue to monitor.

## 2019-04-08 NOTE — ED Notes (Addendum)
Pt had liquid BM. NT cleaned pt, applied barrier cream and brief. According to the family member at bedside, this is the patients 5th liquid BM since arriving to the ED. When NT was about to leave the room, patient had a 6th liquid BM. RN made aware.

## 2019-04-09 DIAGNOSIS — E1165 Type 2 diabetes mellitus with hyperglycemia: Secondary | ICD-10-CM

## 2019-04-09 DIAGNOSIS — R197 Diarrhea, unspecified: Secondary | ICD-10-CM | POA: Diagnosis present

## 2019-04-09 DIAGNOSIS — E876 Hypokalemia: Secondary | ICD-10-CM | POA: Diagnosis present

## 2019-04-09 DIAGNOSIS — K219 Gastro-esophageal reflux disease without esophagitis: Secondary | ICD-10-CM

## 2019-04-09 DIAGNOSIS — N400 Enlarged prostate without lower urinary tract symptoms: Secondary | ICD-10-CM | POA: Diagnosis present

## 2019-04-09 DIAGNOSIS — Z794 Long term (current) use of insulin: Secondary | ICD-10-CM

## 2019-04-09 DIAGNOSIS — R1032 Left lower quadrant pain: Secondary | ICD-10-CM

## 2019-04-09 DIAGNOSIS — K567 Ileus, unspecified: Secondary | ICD-10-CM | POA: Diagnosis present

## 2019-04-09 DIAGNOSIS — F0390 Unspecified dementia without behavioral disturbance: Secondary | ICD-10-CM

## 2019-04-09 LAB — CBC
HCT: 37.6 % — ABNORMAL LOW (ref 39.0–52.0)
Hemoglobin: 11.7 g/dL — ABNORMAL LOW (ref 13.0–17.0)
MCH: 28.1 pg (ref 26.0–34.0)
MCHC: 31.1 g/dL (ref 30.0–36.0)
MCV: 90.2 fL (ref 80.0–100.0)
Platelets: 191 10*3/uL (ref 150–400)
RBC: 4.17 MIL/uL — ABNORMAL LOW (ref 4.22–5.81)
RDW: 13.9 % (ref 11.5–15.5)
WBC: 9.2 10*3/uL (ref 4.0–10.5)
nRBC: 0 % (ref 0.0–0.2)

## 2019-04-09 LAB — GLUCOSE, CAPILLARY
Glucose-Capillary: 162 mg/dL — ABNORMAL HIGH (ref 70–99)
Glucose-Capillary: 143 mg/dL — ABNORMAL HIGH (ref 70–99)
Glucose-Capillary: 129 mg/dL — ABNORMAL HIGH (ref 70–99)
Glucose-Capillary: 93 mg/dL (ref 70–99)

## 2019-04-09 LAB — C DIFFICILE QUICK SCREEN W PCR REFLEX
C Diff antigen: NEGATIVE
C Diff interpretation: NOT DETECTED
C Diff toxin: NEGATIVE

## 2019-04-09 LAB — COMPREHENSIVE METABOLIC PANEL
ALT: 20 U/L (ref 0–44)
AST: 21 U/L (ref 15–41)
Albumin: 3.3 g/dL — ABNORMAL LOW (ref 3.5–5.0)
Alkaline Phosphatase: 71 U/L (ref 38–126)
Anion gap: 8 (ref 5–15)
BUN: 19 mg/dL (ref 8–23)
CO2: 26 mmol/L (ref 22–32)
Calcium: 8.4 mg/dL — ABNORMAL LOW (ref 8.9–10.3)
Chloride: 108 mmol/L (ref 98–111)
Creatinine, Ser: 0.73 mg/dL (ref 0.61–1.24)
GFR calc Af Amer: >60 mL/min (ref >60)
GFR calc non Af Amer: >60 mL/min (ref >60)
Glucose, Bld: 181 mg/dL — ABNORMAL HIGH (ref 70–99)
Potassium: 2.8 mmol/L — ABNORMAL LOW (ref 3.5–5.1)
Sodium: 142 mmol/L (ref 135–145)
Total Bilirubin: 0.5 mg/dL (ref 0.3–1.2)
Total Protein: 6.8 g/dL (ref 6.5–8.1)

## 2019-04-09 LAB — MAGNESIUM: Magnesium: 1.8 mg/dL (ref 1.7–2.4)

## 2019-04-09 LAB — SARS CORONAVIRUS 2 (TAT 6-24 HRS): SARS Coronavirus 2: NEGATIVE

## 2019-04-09 MED ORDER — CIPROFLOXACIN IN D5W 400 MG/200ML IV SOLN
400.0000 mg | Freq: Two times a day (BID) | INTRAVENOUS | Status: DC
  Administered 2019-04-09 – 2019-04-11 (×5): 400 mg via INTRAVENOUS
  Filled 2019-04-09 (×5): qty 200

## 2019-04-09 MED ORDER — RESOURCE THICKENUP CLEAR PO POWD
ORAL | Status: DC | PRN
  Filled 2019-04-09: qty 125

## 2019-04-09 MED ORDER — POTASSIUM CHLORIDE CRYS ER 20 MEQ PO TBCR
40.0000 meq | EXTENDED_RELEASE_TABLET | ORAL | Status: AC
  Administered 2019-04-09 (×2): 40 meq via ORAL
  Filled 2019-04-09 (×2): qty 2

## 2019-04-09 MED ORDER — METRONIDAZOLE IN NACL 5-0.79 MG/ML-% IV SOLN
500.0000 mg | Freq: Three times a day (TID) | INTRAVENOUS | Status: DC
  Administered 2019-04-09 – 2019-04-11 (×8): 500 mg via INTRAVENOUS
  Filled 2019-04-09 (×8): qty 100

## 2019-04-09 NOTE — Progress Notes (Signed)
MD paged concerning K of 2.8. Awaiting response/orders.

## 2019-04-09 NOTE — Progress Notes (Addendum)
Spoke with patients wife, Morey Hummingbird. I asked her questions regarding patients baseline at SNF due to pt having dementia. Updated wife on plan of care. She asked for the provider to call her with pertinent updates. All questions answered and no needs were expressed.  Vane Yapp [phone] home [phone] cell

## 2019-04-09 NOTE — Evaluation (Addendum)
Clinical/Bedside Swallow Evaluation Patient Details  Name: Alan [name]. MRN: [id number] Date of Birth: 11-21-1945  Today's Date: 04/09/2019 Time: SLP Start Time (ACUTE ONLY): 1610 SLP Stop Time (ACUTE ONLY): 1641 SLP Time Calculation (min) (ACUTE ONLY): 31 min  Past Medical History:  Past Medical History:  Diagnosis Date  . Anemia   . Arthritis   . Dementia ([place])   . DM (diabetes mellitus), type 2 with peripheral vascular complications ([place])   . ETOH abuse   . Myocardial infarction ([place])   . Peripheral arterial disease ([place])   . Stroke [facility])    Past Surgical History:  Past Surgical History:  Procedure Laterality Date  . ABDOMINAL AORTOGRAM N/A 10/25/2016   Procedure: Abdominal Aortogram;  Surgeon: Elam Dutch, MD;  Location: [facility] CV LAB;  Service: Cardiovascular;  Laterality: N/A;  . BRAIN SURGERY    . COLONOSCOPY  2015   Negative, performed by GI in [place]  . CORONARY STENT PLACEMENT     " about 25 years ago (10/29/16)  . ENDARTERECTOMY FEMORAL Right 10/30/2016   Procedure: Wendall Papa WITH PATCH GRAFT;  Surgeon: Elam Dutch, MD;  Location: [facility] OR;  Service: Vascular;  Laterality: Right;  . IR NEPHRO TUBE REMOV/FL  07/04/2017  . IR NEPHRO TUBE REMOV/FL  07/07/2017  . IR NEPHROSTOMY EXCHANGE LEFT  06/20/2017  . IR NEPHROSTOMY EXCHANGE RIGHT  06/20/2017  . IR NEPHROSTOMY PLACEMENT LEFT  06/12/2017  . IR NEPHROSTOMY PLACEMENT RIGHT  06/12/2017  . IR URETERAL STENT PLACEMENT EXISTING ACCESS LEFT  06/20/2017  . IR URETERAL STENT PLACEMENT EXISTING ACCESS RIGHT  06/20/2017  . LOWER EXTREMITY ANGIOGRAPHY Right 10/25/2016   Procedure: Lower Extremity Angiography;  Surgeon: Elam Dutch, MD;  Location: [place] CV LAB;  Service: Cardiovascular;  Laterality: Right;   HPI:  73 yo male adm to [facility] with coffee ground emesis - ileus with initial concern for emphysematous gastritis.  Pt is s/p surgical consult and is placed on a clear liquid  diet at this time - as his symptoms have improved over night per notes.  Pt has h/o CVA with residual left sided weakness, GERD, dementia, obstructive ileus, former smoker 10/17.  Pt imaging studies are negative for pulmonary event.  MD ordered swallow eval due to pt being edentulous and unknown if can feed self. .   Assessment / Plan / Recommendation Clinical Impression  Pt has h/o dysphagia with silent nature of aspiration with both nectar and thin liquids - likely from prior CVA.  Last MBS *OP* conducted 02/2018 with recommendations for dys2/honey thick liquids.  SLP reveiwed films from prior MBS studies, pt did aspirate with thin - amount was small and inconsistent.  Aspiration due to spillage to pharynx and decreased timing of laryngeal closure.  SLP can not rule out silent aspiration and mitigation is warranted in this 73 yo that has dementia.  Today pt presents with inconsistent overt coughing with thin liquids (50% - 4/8 trials.  He did not demonstrate any s/s of aspiration with nectar or jello however.   Options include to continue thin liquids with precautions (or change to nectar) and SlP follow up for tolerance.   Per review of many chest imaging reports, pt has not had pneumonias.  SLP called [facility] and spoke to operator but no one picked up the call after 7 minutes of phone ringing.  Will follow up clinically next week - MBS likely containdicated if concern present for ileus. SLP Visit Diagnosis: Dysphagia,  oral phase (R13.11);Other (comment)(likely pharyngeal involvement)    Aspiration Risk  Moderate aspiration risk    Diet Recommendation Thin liquid;Dysphagia 3 (Mech soft)(clears, when advance rec dys3/ground meats, use tsp amounts or thickener if more comfortable for pt and prevents coughing)   Liquid Administration via: Cup Medication Administration: Whole meds with puree Supervision: Full supervision/cueing for compensatory strategies Compensations: Slow rate;Small  sips/bites Postural Changes: Seated upright at 90 degrees;Remain upright for at least 30 minutes after po intake    Other  Recommendations Oral Care Recommendations: Oral care QID Other Recommendations: Order thickener from pharmacy   Follow up Recommendations Skilled Nursing facility      Frequency and Duration min 1 x/week  1 week       Prognosis Barriers to Reach Goals: Cognitive deficits      Swallow Study   General Date of Onset: 04/09/19 HPI: 73 yo male adm to [facility] with coffee ground emesis - ileus with initial concern for emphysematous gastritis.  Pt is s/p surgical consult and is placed on a clear liquid diet at this time - as his symptoms have improved over night per notes.  Pt has h/o CVA with residual left sided weakness, GERD, dementia, obstructive ileus, former smoker 10/17.  Pt imaging studies are negative for pulmonary event.  MD ordered swallow eval due to pt being edentulous and unknown if can feed self. . Type of Study: Bedside Swallow Evaluation Previous Swallow Assessment: MBS 02/2018 - showed aspiration of thin and nectar - (often silent in nature)-- occuring in 1/6 trials with cough, diet was dys2/honey thick liquids with chin tuck and dry swallow Diet Prior to this Study: Thin liquids(clears) Temperature Spikes Noted: No Respiratory Status: Room air History of Recent Intubation: No Behavior/Cognition: Alert;Cooperative Oral Cavity Assessment: Within Functional Limits Oral Care Completed by SLP: No Oral Cavity - Dentition: Edentulous Vision: Functional for self-feeding Self-Feeding Abilities: Able to feed self Patient Positioning: Upright in bed Baseline Vocal Quality: Normal Volitional Cough: Cognitively unable to elicit Volitional Swallow: Unable to elicit    Oral/Motor/Sensory Function Overall Oral Motor/Sensory Function: Other (comment)(no focal deficits despite h/o cva)   Ice Chips Ice chips: Not tested   Thin Liquid Thin Liquid:  Impaired Presentation: Self Fed;Cup;Straw Pharyngeal  Phase Impairments: Cough - Delayed;Suspected delayed Swallow Other Comments: delayed strong coughing noted with 50% of boluses *initial and last boluses 4/8 of thin- this was very inconsistent    Nectar Thick Nectar Thick Liquid: Impaired Presentation: Straw Pharyngeal Phase Impairments: Suspected delayed Swallow   Honey Thick Honey Thick Liquid: Not tested   Puree Puree: Within functional limits Presentation: Spoon Other Comments: jello   Solid     Solid: Not tested Other Comments: pt on clear liquid diet thus did not test solids, lingual protrusion noted during intermittently during session - appears cognitive based      Chales Abrahams 04/09/2019,5:23 PM   Donavan Burnet, MS [facility] SLP Acute Rehab Services Pager [phone] Office [phone]

## 2019-04-09 NOTE — Progress Notes (Signed)
Patient ID: Alan [name]., male   DOB: [birth year], 73 y.o.   MRN: [id number]       Subjective: Patient is very HOH, and confused.  He denies any abdominal pain whatsoever.  Trying to get out of bed to go to his car.  Has no idea he is in the hospital  Objective: Vital signs in last 24 hours: Temp:  [97.7 F (36.5 C)-98.3 F (36.8 C)] 97.8 F (36.6 C) (10/16 0534) Pulse Rate:  [65-84] 72 (10/16 0534) Resp:  [16-22] 17 (10/16 0534) BP: (108-155)/(53-80) 155/75 (10/16 0534) SpO2:  [96 %-100 %] 99 % (10/16 0534) Weight:  [75.2 kg-75.3 kg] 75.2 kg (10/15 2340) Last BM Date: 04/08/19  Intake/Output from previous day: 10/15 0701 - 10/16 0700 In: 509.9 [IV Piggyback:509.9] Out: 200 [Stool:200] Intake/Output this shift: No intake/output data recorded.  PE: Gen: confused, but pleasant Heart: regular Lungs: CTAB Abd: soft, NT, Nd, +BS  Lab Results:  Recent Labs    04/08/19 1410 04/09/19 0321  WBC 10.2 9.2  HGB 12.8* 11.7*  HCT 40.8 37.6*  PLT 191 191   BMET Recent Labs    04/08/19 1410 04/09/19 0321  NA 140 142  K 3.0* 2.8*  CL 105 108  CO2 25 26  GLUCOSE 308* 181*  BUN 22 19  CREATININE 1.07 0.73  CALCIUM 8.4* 8.4*   PT/INR No results for input(s): LABPROT, INR in the last 72 hours. CMP     Component Value Date/Time   NA 142 04/09/2019 0321   NA 138 07/06/2018   K 2.8 (L) 04/09/2019 0321   CL 108 04/09/2019 0321   CO2 26 04/09/2019 0321   GLUCOSE 181 (H) 04/09/2019 0321   BUN 19 04/09/2019 0321   BUN 16 07/06/2018   CREATININE 0.73 04/09/2019 0321   CALCIUM 8.4 (L) 04/09/2019 0321   PROT 6.8 04/09/2019 0321   ALBUMIN 3.3 (L) 04/09/2019 0321   AST 21 04/09/2019 0321   ALT 20 04/09/2019 0321   ALKPHOS 71 04/09/2019 0321   BILITOT 0.5 04/09/2019 0321   GFRNONAA >60 04/09/2019 0321   GFRAA >60 04/09/2019 0321   Lipase     Component Value Date/Time   LIPASE 16 04/08/2019 1410       Studies/Results: Ct Abdomen Pelvis W Contrast  Result  Date: 04/08/2019 CLINICAL DATA:  73 year old male with audible coring. Patient denies pain or vomiting. EXAM: CT ABDOMEN AND PELVIS WITH CONTRAST TECHNIQUE: Multidetector CT imaging of the abdomen and pelvis was performed using the standard protocol following bolus administration of intravenous contrast. CONTRAST:  OMNIPAQUE IOHEXOL 300 MG/ML  SOLN COMPARISON:  CT of the abdomen pelvis dated 03/28/2019 FINDINGS: Lower chest: Bibasilar interstitial coarsening and scarring. There is coronary vascular calcification. No intraperitoneal free air. No free fluid. Hepatobiliary: The liver is unremarkable. There is mild mass effect and compression of the anterior liver by the distended interposed loop of colon. No intrahepatic biliary ductal dilatation. The gallbladder is unremarkable. Pancreas: Unremarkable. No pancreatic ductal dilatation or surrounding inflammatory changes. Spleen: Normal in size without focal abnormality. Adrenals/Urinary Tract: The right adrenal gland is unremarkable. Subcentimeter low attenuating nodule in the lateral limb of the left adrenal gland is not characterized but may represent an adenoma. There is no hydronephrosis on either side. There is cortical irregularity and scarring. There is symmetric enhancement and excretion of contrast by both kidneys. The visualized ureters appear unremarkable. The urinary bladder is partially distended and grossly unremarkable. Air within the urinary bladder may be  introduced via recent catheterization. Clinical correlation is recommended. Stomach/Bowel: There is diffuse air distention of the colon similar to prior CT and measuring up to 7.7 cm in diameter. Liquid content within the colon compatible with diarrheal state. Clinical correlation is recommended. Small pockets of air along the wall of the distal ascending colon (series 4 image 57 and 59) as well as small pockets of air along the wall of the transverse colon (axial series 2 image 45, 48, 50, and  53 are likely within colonic fold or small colonic diverticula. These less likely represent extraluminal air. There is however intraluminal air within the wall of the stomach consistent with pneumatosis. This is of indeterminate etiology or clinical significance. Small amount of air within several small vessels along the inferior aspect of the greater curvature of the body of the stomach likely within the gastro-omental venous branches. No portal venous gas identified. There is no evidence of bowel obstruction. The appendix is normal. Vascular/Lymphatic: Advanced aortoiliac atherosclerotic disease. The origins of the celiac axis, SMA, and IMA are patent. The IVC is unremarkable. The SMV, splenic vein, and main portal vein are patent. No portal venous gas. There is no adenopathy. Reproductive: The prostate and seminal vesicles are grossly unremarkable. No pelvic mass Other: None Musculoskeletal: Osteopenia with degenerative changes of the spine. L1 vertebroplasty. No acute osseous pathology. IMPRESSION: 1. Pneumatosis of the gastric wall of indeterminate clinical significance or etiology. Clinical correlation is recommended to exclude emphysematous gastritis. 2. Diffuse air distention of the colon similar to prior CT, likely colonic ileus. Liquid content within the colon compatible with diarrheal state. No bowel obstruction. Normal appendix. 3. Small scattered pockets of air along the wall of the colon likely within colonic folds or small colonic diverticula. These less likely represent extraluminal air. 4. Air within the urinary bladder may be introduced via recent catheterization. Clinical correlation is recommended. 5. Aortic Atherosclerosis (ICD10-I70.0). These results were called by telephone at the time of interpretation on 04/08/2019 at 4:30 pm to provider Lacretia Leigh , who verbally acknowledged these results. Electronically Signed   By: Anner Crete M.D.   On: 04/08/2019 16:36     Anti-infectives: Anti-infectives (From admission, onward)   Start     Dose/Rate Route Frequency Ordered Stop   04/09/19 0600  ciprofloxacin (CIPRO) IVPB 400 mg     400 mg 200 mL/hr over 60 Minutes Intravenous Every 12 hours 04/09/19 0059     04/09/19 0400  metroNIDAZOLE (FLAGYL) IVPB 500 mg     500 mg 100 mL/hr over 60 Minutes Intravenous Every 8 hours 04/09/19 0059     04/08/19 1700  ciprofloxacin (CIPRO) IVPB 400 mg     400 mg 200 mL/hr over 60 Minutes Intravenous  Once 04/08/19 1652 04/08/19 2123   04/08/19 1700  metroNIDAZOLE (FLAGYL) IVPB 500 mg     500 mg 100 mL/hr over 60 Minutes Intravenous  Once 04/08/19 1652 04/08/19 2124       Assessment/Plan H/o adynamic ileus and C diff colitis with recurrent diarrhea - per medicine  CT finding of gastric pneumatosis  -AF with a normal WBC.  He has no abdominal pain.  Doubt this is a "real" finding indicative of a surgical problem in his stomach. -will allow clear liquids today -cont abx therapy  FEN - CLD VTE - Lovenox ID - C/F   LOS: 1 day    Henreitta Cea , [facility] Surgery 04/09/2019, 8:45 AM Please see Amion for pager number during day hours  7:00am-4:30pm

## 2019-04-09 NOTE — Consult Note (Signed)
[facility] Nurse wound consult note Reason for Consult: Consult requested for buttocks/sacrum.  Pt is frequently incontinent of urine and stool and has a flexiseal inserted to attempt to contain stool. Wound type: 2 areas of red moist partial thickness wounds; 1X1X.1cm and 1.5X1X.1cm; appearance is consistent with moisture and shear, NOT pressure Dressing procedure/placement/frequency: Dressings would trap moisture against skin; leave open to air and apply barrier cream to protect and promote healing.  Please re-consult if further assistance is needed.  Thank-you,  Julien Girt MSN, [place], [place], [place], [place]

## 2019-04-09 NOTE — Progress Notes (Signed)
[facility]  PROGRESS NOTE  Alan [name]. ACZ:[id number] DOB: 19-Apr-1946 DOA: 04/08/2019 PCP: Hendricks Limes, MD  Brief History    HPI: Alan [name]. is a 73 y.o. male with medical history significant of dementia, pad, cerebral aneurysm, diabetes mellitus on insulin, stroke, MI, alcohol abuse, ileus.  Patient has cognitive impairment and is unable to provide a good history. History provided by his wife. Patient presented secondary to abdominal pain and significant diarrhea.  Diarrhea has started from the last few days.  Patient has a history of C. difficile colitis with multiple treatments.  Recently tested negative in the nursing facility.  Abdominal pain started yesterday and located mainly in the left side of his abdomen.  Unknown if anything was given for treatment.    ED Course: Vitals: T-max is 98.1 F, pulse of 75, respirations of 16, blood pressure 122/67, currently on room air Labs: Potassium of 3.0, hemoglobin of 12.8, blood sugar of 308 Imaging: CT abdomen pelvis significant for pneumatosis of the gastric wall with need to exclude emphysematous gastritis, diffuse air distention of colon concerning for ileus, air within the urinary bladder Medications/Course: Cipro, Flagyl  A & P     Abdominal pain with CT imaging suggesting colonic ileus and concern for emphysematous gastritis, improving.  Medical management with IV Cipro, Flagyl and bowel rest has improved abdominal pain with notably less abdominal distension per wife.  General surgery on board, pneumatosis of the gastric wall findings on CT do not seem consistent with clinical picture, continue IV antibiotics for now, trial clear liquids.   Diarrhea in patient with history of recurrent C. difficile infection.  C. difficile PCR here negative.  Closely monitor stool output.  Avoid Imodium in the setting of ileus.  Continue supportive care otherwise.   Hypokalemia in setting of diarrhea.  Magnesium within  normal limits replete orally, repeat in a.m.   Type 2 diabetes, well controlled (A1c 7.7) continue home Lantus regimen, CBG, sliding scale as needed.   Dementia without behavioral disturbance, stable.  Patient is pleasant but unable provide any history from wife at bedside reports stable mental status.  Continue Zoloft.   Insomnia, stable continue trazodone and melatonin   Chronic normocytic anemia.  Hemoglobin stable at baseline, no active signs of bleeding.  Monitor CBC   GERD, stable.  Continue PPI   BPH, stable continue Flomax      DVT prophylaxis: Lovenox Code Status: DNR Family Communication: Wife updated at bedside Disposition Plan: Continue inpatient stay with IV antibiotics, monitor abdominal pain clear liquid diet, monitor stool output.     [facility] Direct contact: see [email or web address] (further directions at bottom of note if needed) 7PM-7AM contact night coverage as at bottom of note 04/09/2019, 1:46 PM  LOS: 1 day   Consultants   Surgery  Procedures   None  Antibiotics   Flagyl, ciprofloxacin IV  Interval History/Subjective  No acute complaints overnight Wife reports patient has significant improvement in abdominal pain  Objective   Vitals:  Vitals:   04/09/19 0534 04/09/19 1344  BP: (!) 155/75 137/74  Pulse: 72 67  Resp: 17 18  Temp: 97.8 F (36.6 C) 98.5 F (36.9 C)  SpO2: 99% 98%    Exam: Awake, oriented only to self, no distress Regular rate and rhythm, no peripheral edema Clear breath sounds bilaterally, normal respiratory effort, on room air Abdomen soft, nondistended, mildly tender to deep palpation, no rebound tenderness or guarding, normal bowel sounds Left BKA  I have personally reviewed the following:   Data Reviewed: Basic Metabolic Panel: Recent Labs  Lab 04/08/19 1410 04/09/19 0321  NA 140 142  K 3.0* 2.8*  CL 105 108  CO2 25 26  GLUCOSE 308* 181*  BUN 22 19  CREATININE 1.07 0.73  CALCIUM 8.4* 8.4*    MG  --  1.8   Liver Function Tests: Recent Labs  Lab 04/08/19 1410 04/09/19 0321  AST 19 21  ALT 22 20  ALKPHOS 102 71  BILITOT 0.5 0.5  PROT 7.9 6.8  ALBUMIN 3.9 3.3*   Recent Labs  Lab 04/08/19 1410  LIPASE 16   No results for input(s): AMMONIA in the last 168 hours. CBC: Recent Labs  Lab 04/08/19 1410 04/09/19 0321  WBC 10.2 9.2  HGB 12.8* 11.7*  HCT 40.8 37.6*  MCV 90.9 90.2  PLT 191 191   Cardiac Enzymes: No results for input(s): CKTOTAL, CKMB, CKMBINDEX, TROPONINI in the last 168 hours. BNP (last 3 results) No results for input(s): BNP in the last 8760 hours.  ProBNP (last 3 results) No results for input(s): PROBNP in the last 8760 hours.  CBG: Recent Labs  Lab 04/08/19 1334 04/08/19 2222 04/09/19 0812 04/09/19 1117  GLUCAP 337* 187* 162* 143*    Recent Results (from the past 240 hour(s))  SARS CORONAVIRUS 2 (TAT 6-24 HRS) Nasopharyngeal Nasopharyngeal Swab     Status: None   Collection Time: 04/08/19  4:53 PM   Specimen: Nasopharyngeal Swab  Result Value Ref Range Status   SARS Coronavirus 2 NEGATIVE NEGATIVE Final    Comment: (NOTE) SARS-CoV-2 target nucleic acids are NOT DETECTED. The SARS-CoV-2 RNA is generally detectable in upper and lower respiratory specimens during the acute phase of infection. Negative results do not preclude SARS-CoV-2 infection, do not rule out co-infections with other pathogens, and should not be used as the sole basis for treatment or other patient management decisions. Negative results must be combined with clinical observations, patient history, and epidemiological information. The expected result is Negative. Fact Sheet for Patients: HairSlick.no Fact Sheet for Healthcare Providers: quierodirigir.com This test is not yet approved or cleared by the [place] FDA and  has been authorized for detection and/or diagnosis of SARS-CoV-2 by FDA under an  Emergency Use Authorization (EUA). This EUA will remain  in effect (meaning this test can be used) for the duration of the COVID-19 declaration under Section 56 4(b)(1) of the Act, 21 U.S.C. section 360bbb-3(b)(1), unless the authorization is terminated or revoked sooner. Performed at [facility], [address] [place], [place] [address]   C difficile quick scan w PCR reflex     Status: None   Collection Time: 04/08/19  5:38 PM   Specimen: STOOL  Result Value Ref Range Status   C Diff antigen NEGATIVE NEGATIVE Final   C Diff toxin NEGATIVE NEGATIVE Final   C Diff interpretation No C. difficile detected.  Final    Comment: Performed at [facility], [address], [place], [place] [address]     Studies: Ct Abdomen Pelvis W Contrast  Result Date: 04/08/2019 CLINICAL DATA:  73 year old male with audible coring. Patient denies pain or vomiting. EXAM: CT ABDOMEN AND PELVIS WITH CONTRAST TECHNIQUE: Multidetector CT imaging of the abdomen and pelvis was performed using the standard protocol following bolus administration of intravenous contrast. CONTRAST:  OMNIPAQUE IOHEXOL 300 MG/ML  SOLN COMPARISON:  CT of the abdomen pelvis dated 03/28/2019 FINDINGS: Lower chest: Bibasilar interstitial coarsening  and scarring. There is coronary vascular calcification. No intraperitoneal free air. No free fluid. Hepatobiliary: The liver is unremarkable. There is mild mass effect and compression of the anterior liver by the distended interposed loop of colon. No intrahepatic biliary ductal dilatation. The gallbladder is unremarkable. Pancreas: Unremarkable. No pancreatic ductal dilatation or surrounding inflammatory changes. Spleen: Normal in size without focal abnormality. Adrenals/Urinary Tract: The right adrenal gland is unremarkable. Subcentimeter low attenuating nodule in the lateral limb of the left adrenal gland is not characterized but may represent an adenoma. There is no  hydronephrosis on either side. There is cortical irregularity and scarring. There is symmetric enhancement and excretion of contrast by both kidneys. The visualized ureters appear unremarkable. The urinary bladder is partially distended and grossly unremarkable. Air within the urinary bladder may be introduced via recent catheterization. Clinical correlation is recommended. Stomach/Bowel: There is diffuse air distention of the colon similar to prior CT and measuring up to 7.7 cm in diameter. Liquid content within the colon compatible with diarrheal state. Clinical correlation is recommended. Small pockets of air along the wall of the distal ascending colon (series 4 image 57 and 59) as well as small pockets of air along the wall of the transverse colon (axial series 2 image 45, 48, 50, and 53 are likely within colonic fold or small colonic diverticula. These less likely represent extraluminal air. There is however intraluminal air within the wall of the stomach consistent with pneumatosis. This is of indeterminate etiology or clinical significance. Small amount of air within several small vessels along the inferior aspect of the greater curvature of the body of the stomach likely within the gastro-omental venous branches. No portal venous gas identified. There is no evidence of bowel obstruction. The appendix is normal. Vascular/Lymphatic: Advanced aortoiliac atherosclerotic disease. The origins of the celiac axis, SMA, and IMA are patent. The IVC is unremarkable. The SMV, splenic vein, and main portal vein are patent. No portal venous gas. There is no adenopathy. Reproductive: The prostate and seminal vesicles are grossly unremarkable. No pelvic mass Other: None Musculoskeletal: Osteopenia with degenerative changes of the spine. L1 vertebroplasty. No acute osseous pathology. IMPRESSION: 1. Pneumatosis of the gastric wall of indeterminate clinical significance or etiology. Clinical correlation is recommended to  exclude emphysematous gastritis. 2. Diffuse air distention of the colon similar to prior CT, likely colonic ileus. Liquid content within the colon compatible with diarrheal state. No bowel obstruction. Normal appendix. 3. Small scattered pockets of air along the wall of the colon likely within colonic folds or small colonic diverticula. These less likely represent extraluminal air. 4. Air within the urinary bladder may be introduced via recent catheterization. Clinical correlation is recommended. 5. Aortic Atherosclerosis (ICD10-I70.0). These results were called by telephone at the time of interpretation on 04/08/2019 at 4:30 pm to provider Lorre Nick , who verbally acknowledged these results. Electronically Signed   By: Elgie Collard M.D.   On: 04/08/2019 16:36    Scheduled Meds:  Chlorhexidine Gluconate Cloth  6 each Topical Daily   cholecalciferol  2,000 Units Oral Daily   enoxaparin (LOVENOX) injection  40 mg Subcutaneous QHS   fluticasone  2 spray Each Nare Daily   influenza vaccine adjuvanted  0.5 mL Intramuscular Tomorrow-1000   insulin aspart  0-9 Units Subcutaneous TID WC   insulin glargine  18 Units Subcutaneous QHS   insulin glargine  24 Units Subcutaneous Daily   Melatonin  10 mg Oral QHS   pantoprazole  40 mg Oral Daily  pneumococcal 23 valent vaccine  0.5 mL Intramuscular Tomorrow-1000   sertraline  150 mg Oral QHS   tamsulosin  0.8 mg Oral QPM   traZODone  50 mg Oral QHS   Continuous Infusions:  ciprofloxacin 400 mg (04/09/19 0537)   metronidazole 500 mg (04/09/19 1213)    Active Problems:   Uncontrolled type 2 diabetes mellitus with hyperglycemia, with long-term current use of insulin (HCC)   Abdominal pain   Dementia without behavioral disturbance (HCC)   Recurrent Clostridium difficile diarrhea      Laverna PeaceShayla D Evora Schechter  [facility]

## 2019-04-09 NOTE — Evaluation (Signed)
Physical Therapy Evaluation Patient Details Name: Alan [name]. MRN: [id number] DOB: 1945/08/15 Today's Date: 04/09/2019   History of Present Illness  Pt was admitted for confusion and abdominal pain.  PMH:  CAD, dementia, stroke, depression and L BKA, R metatarsal amputatons  Clinical Impression  Pt admitted with above diagnosis.  Pt currently with functional limitations due to the deficits listed below (see PT Problem List). Pt will benefit from skilled PT to increase their independence and safety with mobility to allow discharge to the venue listed below.  Will follow acutely to work towards improving bed mobility. Recommend returning to SNF.     Follow Up Recommendations SNF    Equipment Recommendations  None recommended by PT    Recommendations for Other Services       Precautions / Restrictions Precautions Precautions: Fall Precaution Comments: L BKA, fecal tube Restrictions Weight Bearing Restrictions: No      Mobility  Bed Mobility Overal bed mobility: Needs Assistance Bed Mobility: Rolling;Sidelying to Sit;Sit to Sidelying Rolling: Max assist Sidelying to sit: Max assist;+2 for physical assistance     Sit to sidelying: Max assist;+2 for physical assistance    Transfers Overall transfer level: Needs assistance               General transfer comment: laterally scooted up in bed with assist for pad to avoid shearing and scooting assist:  mod +2  Ambulation/Gait                Stairs            Wheelchair Mobility    Modified Rankin (Stroke Patients Only)       Balance Overall balance assessment: Needs assistance Sitting-balance support: Bilateral upper extremity supported Sitting balance-Leahy Scale: Fair Sitting balance - Comments: min guard for safety                                     Pertinent Vitals/Pain Pain Assessment: Faces Faces Pain Scale: No hurt    Home Living Family/patient expects to be  discharged to:: Skilled nursing facility                 Additional Comments: from [facility].  Wife present and states that he gets up to w/c; pt states via lift. He used to use sliding board but no longer does. Pt reports they have used a lift twice. He does have compromised skin on buttocks and flexiseal.  Wife reports that therapy recently stopped seeing pt    Prior Function           Comments: assist for all ADLs.  Wife was going to feed him lunch today     Hand Dominance   Dominant Hand: Right    Extremity/Trunk Assessment   Upper Extremity Assessment Upper Extremity Assessment: Defer to OT evaluation RUE Deficits / Details: general weakness 4-/5 LUE Deficits / Details: no hand movement, but uses arm for scooting    Lower Extremity Assessment Lower Extremity Assessment: RLE deficits/detail;LLE deficits/detail;Generalized weakness RLE Deficits / Details: R transmet, generalized weakness LLE Deficits / Details: L BKA       Communication   Communication: No difficulties(doesn't verbalize a lot)  Cognition Arousal/Alertness: Awake/alert Behavior During Therapy: WFL for tasks assessed/performed Overall Cognitive Status: History of cognitive impairments - at baseline  General Comments: here with confusion.  Follows commands and interacts.  Unsure if he is different that PLOF. H/O dementia      General Comments      Exercises     Assessment/Plan    PT Assessment Patient needs continued PT services  PT Problem List Decreased strength;Decreased activity tolerance;Decreased balance;Decreased mobility;Decreased safety awareness       PT Treatment Interventions DME instruction;Functional mobility training;Neuromuscular re-education;Balance training;Therapeutic exercise;Therapeutic activities;Patient/family education    PT Goals (Current goals can be found in the Care Plan section)  Acute Rehab PT  Goals Patient Stated Goal: none stated PT Goal Formulation: With patient/family Time For Goal Achievement: 04/23/19 Potential to Achieve Goals: Fair    Frequency Min 2X/week   Barriers to discharge        Co-evaluation   Reason for Co-Treatment: Complexity of the patient's impairments (multi-system involvement);For patient/therapist safety PT goals addressed during session: Mobility/safety with mobility;Balance OT goals addressed during session: Strengthening/ROM       AM-PAC PT "6 Clicks" Mobility  Outcome Measure Help needed turning from your back to your side while in a flat bed without using bedrails?: A Lot Help needed moving from lying on your back to sitting on the side of a flat bed without using bedrails?: A Lot Help needed moving to and from a bed to a chair (including a wheelchair)?: Total Help needed standing up from a chair using your arms (e.g., wheelchair or bedside chair)?: Total Help needed to walk in hospital room?: Total Help needed climbing 3-5 steps with a railing? : Total 6 Click Score: 8    End of Session   Activity Tolerance: Patient tolerated treatment well Patient left: in bed;with call bell/phone within reach;with bed alarm set;with family/visitor present Nurse Communication: Mobility status PT Visit Diagnosis: Muscle weakness (generalized) (M62.81);Other abnormalities of gait and mobility (R26.89)    Time: 1142-1200 PT Time Calculation (min) (ACUTE ONLY): 18 min   Charges:   PT Evaluation $PT Eval Low Complexity: 1 Low          Alan [name], [place] Pager [phone] 04/09/2019   Alan [name] 04/09/2019, 1:23 PM

## 2019-04-09 NOTE — Evaluation (Signed)
Occupational Therapy Evaluation Patient Details Name: Alan [name]. MRN: [id number] DOB: 1945/11/19 Today's Date: 04/09/2019    History of Present Illness Pt was admitted for confusion and abdominal pain.  PMH:  CAD, dementia, stroke, depression and L BKA, R metatarsal amputatons   Clinical Impression   Pt was admitted for the above.  He is from [facility] and appears near baseline.  Pt needs max/total A for adls and assists with lateral scoots, but he has a fecal tube and compromised skin. SNF can screen for further OT interventions when he returns there.    Follow Up Recommendations  SNF    Equipment Recommendations  None recommended by OT    Recommendations for Other Services       Precautions / Restrictions Precautions Precautions: Fall Restrictions Weight Bearing Restrictions: No      Mobility Bed Mobility Overal bed mobility: Needs Assistance Bed Mobility: Rolling;Sidelying to Sit;Sit to Sidelying Rolling: Max assist Sidelying to sit: Max assist;+2 for physical assistance     Sit to sidelying: Max assist;+2 for physical assistance    Transfers                 General transfer comment: laterally scooted up in bed with assist for pad to avoid shearing and scooting assist:  mod +2    Balance Overall balance assessment: Needs assistance Sitting-balance support: Bilateral upper extremity supported Sitting balance-Leahy Scale: Fair Sitting balance - Comments: min guard for safety                                   ADL either performed or assessed with clinical judgement   ADL Overall ADL's : Needs assistance/impaired                                       General ADL Comments: pt needs max/to total for all adls     Vision         Perception     Praxis      Pertinent Vitals/Pain Pain Assessment: Faces Faces Pain Scale: No hurt     Hand Dominance Right   Extremity/Trunk Assessment Upper  Extremity Assessment Upper Extremity Assessment: RUE deficits/detail;LUE deficits/detail RUE Deficits / Details: general weakness 4-/5 LUE Deficits / Details: no hand movement, but uses arm for scooting           Communication Communication Communication: No difficulties(doesn't verbalize a lot)   Cognition Arousal/Alertness: Awake/alert Behavior During Therapy: WFL for tasks assessed/performed Overall Cognitive Status: Difficult to assess                                 General Comments: here with confusion.  Follows commands and interacts.  Unsure if he is different that PLOF. H/O dementia   General Comments       Exercises     Shoulder Instructions      Home Living Family/patient expects to be discharged to:: Skilled nursing facility                                 Additional Comments: from [facility].  Wife present and states that he gets up to w/c; pt states via lift. He used to  use sliding board but no longer does. Pt reports they have used a lift twice. He does have compromised skin on buttocks and flexiseal.  Wife reports that therapy recently stopped seeing pt      Prior Functioning/Environment          Comments: assist for all ADLs.  Wife was going to feed him lunch today        OT Problem List:        OT Treatment/Interventions:      OT Goals(Current goals can be found in the care plan section) Acute Rehab OT Goals Patient Stated Goal: none stated OT Goal Formulation: All assessment and education complete, DC therapy  OT Frequency:     Barriers to D/C:            Co-evaluation PT/OT/SLP Co-Evaluation/Treatment: Yes Reason for Co-Treatment: Complexity of the patient's impairments (multi-system involvement);For patient/therapist safety PT goals addressed during session: Mobility/safety with mobility;Balance OT goals addressed during session: Strengthening/ROM      AM-PAC OT "6 Clicks" Daily Activity      Outcome Measure Help from another person eating meals?: Total Help from another person taking care of personal grooming?: Total Help from another person toileting, which includes using toliet, bedpan, or urinal?: Total Help from another person bathing (including washing, rinsing, drying)?: Total Help from another person to put on and taking off regular upper body clothing?: Total Help from another person to put on and taking off regular lower body clothing?: Total 6 Click Score: 6   End of Session    Activity Tolerance: Patient tolerated treatment well Patient left: in bed;with call bell/phone within reach;with bed alarm set  OT Visit Diagnosis: Muscle weakness (generalized) (M62.81)                Time: 0998-3382 OT Time Calculation (min): 20 min Charges:  OT General Charges $OT Visit: 1 Visit OT Evaluation $OT Eval Low Complexity: 1 Low  Lesle Chris, OTR/L Acute Rehabilitation Services [phone] [facility] pager [phone] office 04/09/2019  [place] 04/09/2019, 12:27 PM

## 2019-04-10 ENCOUNTER — Inpatient Hospital Stay (HOSPITAL_COMMUNITY): Payer: Medicare Other

## 2019-04-10 DIAGNOSIS — R109 Unspecified abdominal pain: Secondary | ICD-10-CM

## 2019-04-10 LAB — CBC
HCT: 39.6 % (ref 39.0–52.0)
Hemoglobin: 12.2 g/dL — ABNORMAL LOW (ref 13.0–17.0)
MCH: 27.5 pg (ref 26.0–34.0)
MCHC: 30.8 g/dL (ref 30.0–36.0)
MCV: 89.4 fL (ref 80.0–100.0)
Platelets: 202 10*3/uL (ref 150–400)
RBC: 4.43 MIL/uL (ref 4.22–5.81)
RDW: 13.9 % (ref 11.5–15.5)
WBC: 9.1 10*3/uL (ref 4.0–10.5)
nRBC: 0 % (ref 0.0–0.2)

## 2019-04-10 LAB — BASIC METABOLIC PANEL
Anion gap: 10 (ref 5–15)
Anion gap: 14 (ref 5–15)
Anion gap: 8 (ref 5–15)
BUN: 9 mg/dL (ref 8–23)
BUN: 9 mg/dL (ref 8–23)
BUN: 11 mg/dL (ref 8–23)
CO2: 26 mmol/L (ref 22–32)
CO2: 22 mmol/L (ref 22–32)
CO2: 26 mmol/L (ref 22–32)
Calcium: 8.5 mg/dL — ABNORMAL LOW (ref 8.9–10.3)
Calcium: 8.6 mg/dL — ABNORMAL LOW (ref 8.9–10.3)
Calcium: 8.3 mg/dL — ABNORMAL LOW (ref 8.9–10.3)
Chloride: 108 mmol/L (ref 98–111)
Chloride: 111 mmol/L (ref 98–111)
Chloride: 104 mmol/L (ref 98–111)
Creatinine, Ser: 0.69 mg/dL (ref 0.61–1.24)
Creatinine, Ser: 0.69 mg/dL (ref 0.61–1.24)
Creatinine, Ser: 0.66 mg/dL (ref 0.61–1.24)
GFR calc Af Amer: >60 mL/min (ref >60)
GFR calc Af Amer: >60 mL/min (ref >60)
GFR calc Af Amer: >60 mL/min (ref >60)
GFR calc non Af Amer: >60 mL/min (ref >60)
GFR calc non Af Amer: >60 mL/min (ref >60)
GFR calc non Af Amer: >60 mL/min (ref >60)
Glucose, Bld: 99 mg/dL (ref 70–99)
Glucose, Bld: 96 mg/dL (ref 70–99)
Glucose, Bld: 222 mg/dL — ABNORMAL HIGH (ref 70–99)
Potassium: 2.5 mmol/L — CL (ref 3.5–5.1)
Potassium: 2.6 mmol/L — CL (ref 3.5–5.1)
Potassium: 3.1 mmol/L — ABNORMAL LOW (ref 3.5–5.1)
Sodium: 144 mmol/L (ref 135–145)
Sodium: 147 mmol/L — ABNORMAL HIGH (ref 135–145)
Sodium: 138 mmol/L (ref 135–145)

## 2019-04-10 LAB — MAGNESIUM: Magnesium: 1.9 mg/dL (ref 1.7–2.4)

## 2019-04-10 LAB — GLUCOSE, CAPILLARY
Glucose-Capillary: 126 mg/dL — ABNORMAL HIGH (ref 70–99)
Glucose-Capillary: 150 mg/dL — ABNORMAL HIGH (ref 70–99)
Glucose-Capillary: 177 mg/dL — ABNORMAL HIGH (ref 70–99)
Glucose-Capillary: 190 mg/dL — ABNORMAL HIGH (ref 70–99)

## 2019-04-10 MED ORDER — HYPROMELLOSE (GONIOSCOPIC) 2.5 % OP SOLN: 1.0000 [drp] | Freq: Four times a day (QID) | OPHTHALMIC | Status: DC | PRN

## 2019-04-10 MED ORDER — POLYVINYL ALCOHOL 1.4 % OP SOLN
1.0000 [drp] | OPHTHALMIC | Status: DC | PRN
  Administered 2019-04-10 – 2019-04-12 (×3): 1 [drp] via OPHTHALMIC
  Filled 2019-04-10: qty 15

## 2019-04-10 MED ORDER — POTASSIUM CHLORIDE CRYS ER 20 MEQ PO TBCR
40.0000 meq | EXTENDED_RELEASE_TABLET | ORAL | Status: AC
  Administered 2019-04-10 (×2): 40 meq via ORAL
  Filled 2019-04-10 (×2): qty 2

## 2019-04-10 NOTE — Progress Notes (Signed)
[facility]  PROGRESS NOTE  Alan [name]. HCW:[id number] DOB: 1946/05/30 DOA: 04/08/2019 PCP: Pecola Lawless, MD  Brief History    HPI: Alan [name]. is a 73 y.o. male with medical history significant of dementia, pad, cerebral aneurysm, diabetes mellitus on insulin, stroke, MI, alcohol abuse, ileus.  Patient has cognitive impairment and is unable to provide a good history. History provided by his wife. Patient presented secondary to abdominal pain and significant diarrhea.  Diarrhea has started from the last few days.  Patient has a history of C. difficile colitis with multiple treatments.  Recently tested negative in the nursing facility.  Abdominal pain started yesterday and located mainly in the left side of his abdomen.  Unknown if anything was given for treatment.    ED Course: Vitals: T-max is 98.1 F, pulse of 75, respirations of 16, blood pressure 122/67, currently on room air Labs: Potassium of 3.0, hemoglobin of 12.8, blood sugar of 308 Imaging: CT abdomen pelvis significant for pneumatosis of the gastric wall with need to exclude emphysematous gastritis, diffuse air distention of colon concerning for ileus, air within the urinary bladder Medications/Course: Cipro, Flagyl  A & P     Abdominal pain with CT imaging suggesting colonic ileus and concern for emphysematous gastritis, improving.  Medical management with IV Cipro, Flagyl and bowel rest has improved abdominal pain with notably less abdominal distension per wife.  Doing well on liquid diet will advance and closely monitor.  Abdominal x-ray shows no gas in colon today,  general surgery on board, pneumatosis of the gastric wall findings on admitting CT do not seem consistent with clinical picture, continue IV antibiotics for now will go to oral if does well on liquid diet    Diarrhea in patient with history of recurrent C. difficile infection, improving  C. difficile PCR here negative.  Stool output slowing  down.  No ileus on abdominal XR.  Continue supportive care otherwise.     Hypokalemia in setting of diarrhea.  Magnesium within normal limit, continue to replete orally, repeat in a.m.   Back pain.  Wife reports concern for injury to back and nursing facility.  Lumbar x-ray shows no acute fracture or lesions.  Does have history of compression fracture/vertebroplasty, Tylenol as needed   Type 2 diabetes, well controlled (A1c 7.7) continue home Lantus regimen, CBG, sliding scale as needed.   Dementia without behavioral disturbance, stable.  Patient is pleasant but unable provide any history from wife at bedside reports stable mental status.  Continue Zoloft.   Insomnia, stable continue trazodone and melatonin   Chronic normocytic anemia.  Hemoglobin stable at baseline, no active signs of bleeding.  Monitor CBC   GERD, stable.  Continue PPI   BPH, stable continue Flomax    DVT prophylaxis: Lovenox Code Status: DNR Family Communication: Wife updated at bedside Disposition Plan: Continue inpatient stay with IV antibiotics, monitor abdominal pain full liquid diet, monitor stool output.     [facility] Direct contact: see [email or web address] (further directions at bottom of note if needed) 7PM-7AM contact night coverage as at bottom of note 04/10/2019, 6:09 PM  LOS: 2 days   Consultants  . Surgery  Procedures  . None  Antibiotics  . Flagyl, ciprofloxacin IV  Interval History/Subjective  No acute complaints overnight Wife reports patient has significant improvement in abdominal pain  Objective   Vitals:  Vitals:   04/10/19 0527 04/10/19 1427  BP: (!) 154/84 (!) 117/59  Pulse: 65 60  Resp: 16 16  Temp: 98.6 F (37 C) 97.8 F (36.6 C)  SpO2: 100% 98%    Exam: Awake, oriented only to self, no distress Regular rate and rhythm, no peripheral edema Clear breath sounds bilaterally, normal respiratory effort, on room air Abdomen soft, nondistended, mildly tender to  deep palpation on the left, no rebound tenderness or guarding, normal bowel sounds Left BKA   I have personally reviewed the following:   Data Reviewed: Basic Metabolic Panel: Recent Labs  Lab 04/08/19 1410 04/09/19 0321 04/10/19 0316 04/10/19 0555  NA 140 142 144 147*  K 3.0* 2.8* 2.5* 2.6*  CL 105 108 108 111  CO2 25 26 26 22   GLUCOSE 308* 181* 99 96  BUN 22 19 9 9   CREATININE 1.07 0.73 0.69 0.69  CALCIUM 8.4* 8.4* 8.5* 8.6*  MG  --  1.8  --  1.9   Liver Function Tests: Recent Labs  Lab 04/08/19 1410 04/09/19 0321  AST 19 21  ALT 22 20  ALKPHOS 102 71  BILITOT 0.5 0.5  PROT 7.9 6.8  ALBUMIN 3.9 3.3*   Recent Labs  Lab 04/08/19 1410  LIPASE 16   No results for input(s): AMMONIA in the last 168 hours. CBC: Recent Labs  Lab 04/08/19 1410 04/09/19 0321 04/10/19 0316  WBC 10.2 9.2 9.1  HGB 12.8* 11.7* 12.2*  HCT 40.8 37.6* 39.6  MCV 90.9 90.2 89.4  PLT 191 191 202   Cardiac Enzymes: No results for input(s): CKTOTAL, CKMB, CKMBINDEX, TROPONINI in the last 168 hours. BNP (last 3 results) No results for input(s): BNP in the last 8760 hours.  ProBNP (last 3 results) No results for input(s): PROBNP in the last 8760 hours.  CBG: Recent Labs  Lab 04/09/19 1720 04/09/19 2138 04/10/19 0722 04/10/19 1333 04/10/19 1716  GLUCAP 129* 93 150* 177* 190*    Recent Results (from the past 240 hour(s))  SARS CORONAVIRUS 2 (TAT 6-24 HRS) Nasopharyngeal Nasopharyngeal Swab     Status: None   Collection Time: 04/08/19  4:53 PM   Specimen: Nasopharyngeal Swab  Result Value Ref Range Status   SARS Coronavirus 2 NEGATIVE NEGATIVE Final    Comment: (NOTE) SARS-CoV-2 target nucleic acids are NOT DETECTED. The SARS-CoV-2 RNA is generally detectable in upper and lower respiratory specimens during the acute phase of infection. Negative results do not preclude SARS-CoV-2 infection, do not rule out co-infections with other pathogens, and should not be used as the  sole basis for treatment or other patient management decisions. Negative results must be combined with clinical observations, patient history, and epidemiological information. The expected result is Negative. Fact Sheet for Patients: HairSlick.nohttps://www.fda.gov/media/138098/download Fact Sheet for Healthcare Providers: quierodirigir.comhttps://www.fda.gov/media/138095/download This test is not yet approved or cleared by the [place] FDA and  has been authorized for detection and/or diagnosis of SARS-CoV-2 by FDA under an Emergency Use Authorization (EUA). This EUA will remain  in effect (meaning this test can be used) for the duration of the COVID-19 declaration under Section 56 4(b)(1) of the Act, 21 U.S.C. section 360bbb-3(b)(1), unless the authorization is terminated or revoked sooner. Performed at [facility], [address] [place], [place] [address]   C difficile quick scan w PCR reflex     Status: None   Collection Time: 04/08/19  5:38 PM   Specimen: STOOL  Result Value Ref Range Status   C Diff antigen NEGATIVE NEGATIVE Final   C Diff toxin NEGATIVE NEGATIVE Final   C Diff interpretation No  C. difficile detected.  Final    Comment: Performed at [facility], [place] [address], [place], [place] [address]     Studies: Dg Lumbar Spine Complete  Result Date: 04/10/2019 CLINICAL DATA:  73 year old male with abdominal pain. EXAM: ABDOMEN - 1 VIEW; LUMBAR SPINE - COMPLETE 4+ VIEW COMPARISON:  CT of the abdomen pelvis dated 04/08/2019 FINDINGS: There is no bowel dilatation or evidence of obstruction. Air is noted throughout the colon. No free air identified. There is no acute fracture or subluxation of the lumbar spine. The bones are osteopenic. Chronic L1 compression fracture and vertebroplasty. Atherosclerotic calcification of the aorta. The soft tissues are grossly unremarkable. IMPRESSION: 1. No acute findings. 2. No bowel dilatation or evidence of obstruction.  Electronically Signed   By: Anner Crete M.D.   On: 04/10/2019 17:07   Dg Abd 1 View  Result Date: 04/10/2019 CLINICAL DATA:  73 year old male with abdominal pain. EXAM: ABDOMEN - 1 VIEW; LUMBAR SPINE - COMPLETE 4+ VIEW COMPARISON:  CT of the abdomen pelvis dated 04/08/2019 FINDINGS: There is no bowel dilatation or evidence of obstruction. Air is noted throughout the colon. No free air identified. There is no acute fracture or subluxation of the lumbar spine. The bones are osteopenic. Chronic L1 compression fracture and vertebroplasty. Atherosclerotic calcification of the aorta. The soft tissues are grossly unremarkable. IMPRESSION: 1. No acute findings. 2. No bowel dilatation or evidence of obstruction. Electronically Signed   By: Anner Crete M.D.   On: 04/10/2019 17:07    Scheduled Meds: . Chlorhexidine Gluconate Cloth  6 each Topical Daily  . cholecalciferol  2,000 Units Oral Daily  . enoxaparin (LOVENOX) injection  40 mg Subcutaneous QHS  . fluticasone  2 spray Each Nare Daily  . influenza vaccine adjuvanted  0.5 mL Intramuscular Tomorrow-1000  . insulin aspart  0-9 Units Subcutaneous TID WC  . insulin glargine  18 Units Subcutaneous QHS  . insulin glargine  24 Units Subcutaneous Daily  . Melatonin  10 mg Oral QHS  . pantoprazole  40 mg Oral Daily  . pneumococcal 23 valent vaccine  0.5 mL Intramuscular Tomorrow-1000  . sertraline  150 mg Oral QHS  . tamsulosin  0.8 mg Oral QPM  . traZODone  50 mg Oral QHS   Continuous Infusions: . ciprofloxacin 400 mg (04/10/19 1756)  . metronidazole 500 mg (04/10/19 1155)    Active Problems:   Uncontrolled type 2 diabetes mellitus with hyperglycemia, with long-term current use of insulin (HCC)   Abdominal pain   Dementia without behavioral disturbance (HCC)   Recurrent Clostridium difficile diarrhea   Ileus (HCC)   Hypokalemia   Diarrhea, unspecified   GERD (gastroesophageal reflux disease)   BPH (benign prostatic hyperplasia)       Desiree Hane  [facility]

## 2019-04-10 NOTE — TOC Initial Note (Signed)
Transition of Care (TOC) - Initial/Assessment Note    Patient Details  Name: Alan [name]. MRN: [id number] Date of Birth: 1945-07-15  Transition of Care [facility]) CM/SW Contact:    Mithra Spano Sherryle Lis, LCSW Phone Number: 04/10/2019, 3:17 PM  Clinical Narrative:   CSW at bedside to conduct TOC assessment. Pts spouse, Jayd Cadieux, is at the bedside during the time of assessment. CSW informed family of the PT/OT recommendations of SNF for short term rehab.   Lyla Son reports that patient is currently a long term care resident at [facility]. Lyla Son goes into detail and explains that he is total care. 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 Pt is unable to propel himself in the wheelchair. Lyla Son also reports that patient is having worsening dementia.   Pt will discharge back to [facility] once medically stable.   TOC team will continue to follow patient for any discharge related needs. Miaisabella Bacorn Sherryle Lis LCSWA Transitions of Care  Clinical Social Worker  Ph: [phone]              Expected Discharge Plan: Skilled Nursing Facility Barriers to Discharge: Continued Medical Work up   Patient Goals and CMS Choice Patient states their goals for this hospitalization and ongoing recovery are:: to return to his place of residence, LTC at [facility] CMS Medicare.gov Compare Post Acute Care list provided to:: Patient    Expected Discharge Plan and Services Expected Discharge Plan: Skilled Nursing Facility In-house Referral: Clinical Social Work Discharge Planning Services: CM Consult Post Acute Care Choice: Skilled Nursing Facility Living arrangements for the past 2 months: Skilled Nursing Facility                                      Prior Living Arrangements/Services Living arrangements for the past 2 months: Skilled Nursing Facility   Patient language and need for interpreter reviewed:: Yes Do you feel safe going back to the place where  you live?: Yes      Need for Family Participation in Patient Care: Yes (Comment) Care giver support system in place?: Yes (comment)   Criminal Activity/Legal Involvement Pertinent to Current Situation/Hospitalization: No - Comment as needed  Activities of Daily Living Home Assistive Devices/Equipment: None(unsure) ADL Screening (condition at time of admission) Patient's cognitive ability adequate to safely complete daily activities?: No Is the patient deaf or have difficulty hearing?: No Does the patient have difficulty seeing, even when wearing glasses/contacts?: No Does the patient have difficulty concentrating, remembering, or making decisions?: Yes Patient able to express need for assistance with ADLs?: No Does the patient have difficulty dressing or bathing?: Yes Independently performs ADLs?: No Does the patient have difficulty walking or climbing stairs?: Yes Weakness of Legs: Both Weakness of Arms/Hands: Left  Permission Sought/Granted Permission sought to share information with : Case Manager, Family Supports, Oceanographer granted to share information with : Yes, Verbal Permission Granted  Share Information with NAME: Pistol Kessenich  Permission granted to share info w AGENCY: [facility]  Permission granted to share info w Relationship: Spouse  Permission granted to share info w Contact Information: PH: [phone]  Emotional Assessment Appearance:: Appears stated age Attitude/Demeanor/Rapport: Unable to Assess Affect (typically observed): Stable Orientation: : Oriented to Self, Oriented to Place, Oriented to  Time, Oriented to Situation Alcohol / Substance Use: Not Applicable Psych Involvement: No (comment)  Admission diagnosis:  Abdominal pain, unspecified abdominal location [R10.9] Patient Active Problem List   Diagnosis Date Noted  . Ileus ([place]) 04/09/2019  . Hypokalemia 04/09/2019  . Diarrhea, unspecified 04/09/2019  . GERD  (gastroesophageal reflux disease) 04/09/2019  . BPH (benign prostatic hyperplasia) 04/09/2019  . Abdominal pain 04/08/2019  . Dementia without behavioral disturbance ([place]) 04/08/2019  . 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 C. difficile colitis 05/07/2018  . Neurocognitive deficits 02/26/2018  . Dysphagia 02/26/2018  . Klebsiella sepsis ([place]) 12/28/2017  . Uncontrolled type 2 diabetes mellitus with hyperglycemia, with long-term current use of insulin ([place]) 12/27/2017  . DKA, type 2 ([place]) 12/26/2017  . Sepsis due to undetermined organism ([place]) 12/26/2017  . 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 Urinary tract infection associated with nephrostomy catheter ([place]) 07/07/2017  . Acute urinary retention 07/07/2017  . Acute metabolic encephalopathy 86/38/1771  . Ureteral calculus, left   . Urinary tract infection due to Klebsiella species   . Sepsis ([place]) 07/02/2017  . Candida tropicalis infection 06/20/2017  . Bilateral nephrolithiasis 06/20/2017  . History of nephrostomy   . Candidemia ([place]) 06/15/2017  . Obstruction of kidney   . Sepsis due to coagulase-negative staphylococcal infection ([place]) 06/11/2017  . 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

## 2019-04-10 NOTE — Progress Notes (Signed)
Pt refused his meds tonight. RN attempted twice. Pt states "I don't know noting about those medicines, I don't take nothing I don't know about". Pt did take his eye drops bc he was c/o eye irritation. Will continue to monitor.

## 2019-04-10 NOTE — Progress Notes (Signed)
Lab called to give critical potassium level of 2.5, patient's potassium was 2.5 yesterday and potassium was administered. Patient is stable and being monitored, MD on call notified, waiting for new orders. Dawson Bills, RN

## 2019-04-10 NOTE — Progress Notes (Signed)
Assessment & Plan: Gastric pneumatosis  Abnormal finding on CT scan of abdomen - no clinical signs or symptoms  Empiric abx Rx per medicine  Tolerating clear liquids - advance to full liquid diet today  Will follow C diff colitis, diarrhea, hypokalemia  Per medical service        Darnell Level, MD       [facility]       Office: [phone]   Chief Complaint: CT scan finding of gastric pneumatosis  Subjective: Patient somnolent, responds to voice.  No complaints.  Objective: Vital signs in last 24 hours: Temp:  [98.5 F (36.9 C)-98.7 F (37.1 C)] 98.6 F (37 C) (10/17 0527) Pulse Rate:  [65-71] 65 (10/17 0527) Resp:  [16-18] 16 (10/17 0527) BP: (137-163)/(74-84) 154/84 (10/17 0527) SpO2:  [98 %-100 %] 100 % (10/17 0527) Last BM Date: 04/09/19  Intake/Output from previous day: 10/16 0701 - 10/17 0700 In: 1540.6 [P.O.:840; IV Piggyback:700.6] Out: 0  Intake/Output this shift: No intake/output data recorded.  Physical Exam: HEENT - sclerae clear, mucous membranes moist Neck - soft Chest - clear bilaterally Cor - RRR Abdomen - soft, slightly protuberant; non-tender; no mass   Lab Results:  Recent Labs    04/09/19 0321 04/10/19 0316  WBC 9.2 9.1  HGB 11.7* 12.2*  HCT 37.6* 39.6  PLT 191 202   BMET Recent Labs    04/09/19 0321 04/10/19 0316  NA 142 144  K 2.8* 2.5*  CL 108 108  CO2 26 26  GLUCOSE 181* 99  BUN 19 9  CREATININE 0.73 0.69  CALCIUM 8.4* 8.5*   PT/INR No results for input(s): LABPROT, INR in the last 72 hours. Comprehensive Metabolic Panel:    Component Value Date/Time   NA 144 04/10/2019 0316   NA 142 04/09/2019 0321   NA 138 07/06/2018   NA 144 05/26/2018   K 2.5 (LL) 04/10/2019 0316   K 2.8 (L) 04/09/2019 0321   CL 108 04/10/2019 0316   CL 108 04/09/2019 0321   CO2 26 04/10/2019 0316   CO2 26 04/09/2019 0321   BUN 9 04/10/2019 0316   BUN 19 04/09/2019 0321   BUN 16 07/06/2018   BUN 12  05/26/2018   CREATININE 0.69 04/10/2019 0316   CREATININE 0.73 04/09/2019 0321   GLUCOSE 99 04/10/2019 0316   GLUCOSE 181 (H) 04/09/2019 0321   CALCIUM 8.5 (L) 04/10/2019 0316   CALCIUM 8.4 (L) 04/09/2019 0321   AST 21 04/09/2019 0321   AST 19 04/08/2019 1410   ALT 20 04/09/2019 0321   ALT 22 04/08/2019 1410   ALKPHOS 71 04/09/2019 0321   ALKPHOS 102 04/08/2019 1410   BILITOT 0.5 04/09/2019 0321   BILITOT 0.5 04/08/2019 1410   PROT 6.8 04/09/2019 0321   PROT 7.9 04/08/2019 1410   ALBUMIN 3.3 (L) 04/09/2019 0321   ALBUMIN 3.9 04/08/2019 1410    Studies/Results: Ct Abdomen Pelvis W Contrast  Result Date: 04/08/2019 CLINICAL DATA:  73 year old male with audible coring. Patient denies pain or vomiting. EXAM: CT ABDOMEN AND PELVIS WITH CONTRAST TECHNIQUE: Multidetector CT imaging of the abdomen and pelvis was performed using the standard protocol following bolus administration of intravenous contrast. CONTRAST:  OMNIPAQUE IOHEXOL 300 MG/ML  SOLN COMPARISON:  CT of the abdomen pelvis dated 03/28/2019 FINDINGS: Lower chest: Bibasilar interstitial coarsening and scarring. There is coronary vascular calcification. No intraperitoneal free air. No free fluid. Hepatobiliary: The liver is unremarkable. There is mild mass effect  and compression of the anterior liver by the distended interposed loop of colon. No intrahepatic biliary ductal dilatation. The gallbladder is unremarkable. Pancreas: Unremarkable. No pancreatic ductal dilatation or surrounding inflammatory changes. Spleen: Normal in size without focal abnormality. Adrenals/Urinary Tract: The right adrenal gland is unremarkable. Subcentimeter low attenuating nodule in the lateral limb of the left adrenal gland is not characterized but may represent an adenoma. There is no hydronephrosis on either side. There is cortical irregularity and scarring. There is symmetric enhancement and excretion of contrast by both kidneys. The visualized ureters  appear unremarkable. The urinary bladder is partially distended and grossly unremarkable. Air within the urinary bladder may be introduced via recent catheterization. Clinical correlation is recommended. Stomach/Bowel: There is diffuse air distention of the colon similar to prior CT and measuring up to 7.7 cm in diameter. Liquid content within the colon compatible with diarrheal state. Clinical correlation is recommended. Small pockets of air along the wall of the distal ascending colon (series 4 image 57 and 59) as well as small pockets of air along the wall of the transverse colon (axial series 2 image 45, 48, 50, and 53 are likely within colonic fold or small colonic diverticula. These less likely represent extraluminal air. There is however intraluminal air within the wall of the stomach consistent with pneumatosis. This is of indeterminate etiology or clinical significance. Small amount of air within several small vessels along the inferior aspect of the greater curvature of the body of the stomach likely within the gastro-omental venous branches. No portal venous gas identified. There is no evidence of bowel obstruction. The appendix is normal. Vascular/Lymphatic: Advanced aortoiliac atherosclerotic disease. The origins of the celiac axis, SMA, and IMA are patent. The IVC is unremarkable. The SMV, splenic vein, and main portal vein are patent. No portal venous gas. There is no adenopathy. Reproductive: The prostate and seminal vesicles are grossly unremarkable. No pelvic mass Other: None Musculoskeletal: Osteopenia with degenerative changes of the spine. L1 vertebroplasty. No acute osseous pathology. IMPRESSION: 1. Pneumatosis of the gastric wall of indeterminate clinical significance or etiology. Clinical correlation is recommended to exclude emphysematous gastritis. 2. Diffuse air distention of the colon similar to prior CT, likely colonic ileus. Liquid content within the colon compatible with diarrheal  state. No bowel obstruction. Normal appendix. 3. Small scattered pockets of air along the wall of the colon likely within colonic folds or small colonic diverticula. These less likely represent extraluminal air. 4. Air within the urinary bladder may be introduced via recent catheterization. Clinical correlation is recommended. 5. Aortic Atherosclerosis (ICD10-I70.0). These results were called by telephone at the time of interpretation on 04/08/2019 at 4:30 pm to provider Lacretia Leigh , who verbally acknowledged these results. Electronically Signed   By: Anner Crete M.D.   On: 04/08/2019 16:36      Armandina Gemma 04/10/2019  Patient ID: [name]., male   DOB: 1945/08/25, 73 y.o.   MRN: [id number]

## 2019-04-11 DIAGNOSIS — M545 Low back pain: Secondary | ICD-10-CM

## 2019-04-11 DIAGNOSIS — R1084 Generalized abdominal pain: Secondary | ICD-10-CM

## 2019-04-11 DIAGNOSIS — R197 Diarrhea, unspecified: Secondary | ICD-10-CM

## 2019-04-11 LAB — GLUCOSE, CAPILLARY
Glucose-Capillary: 115 mg/dL — ABNORMAL HIGH (ref 70–99)
Glucose-Capillary: 223 mg/dL — ABNORMAL HIGH (ref 70–99)
Glucose-Capillary: 193 mg/dL — ABNORMAL HIGH (ref 70–99)
Glucose-Capillary: 197 mg/dL — ABNORMAL HIGH (ref 70–99)

## 2019-04-11 LAB — BASIC METABOLIC PANEL
Anion gap: 9 (ref 5–15)
Anion gap: 9 (ref 5–15)
BUN: 10 mg/dL (ref 8–23)
BUN: 9 mg/dL (ref 8–23)
CO2: 26 mmol/L (ref 22–32)
CO2: 25 mmol/L (ref 22–32)
Calcium: 8.6 mg/dL — ABNORMAL LOW (ref 8.9–10.3)
Calcium: 8.7 mg/dL — ABNORMAL LOW (ref 8.9–10.3)
Chloride: 104 mmol/L (ref 98–111)
Chloride: 106 mmol/L (ref 98–111)
Creatinine, Ser: 0.65 mg/dL (ref 0.61–1.24)
Creatinine, Ser: 0.78 mg/dL (ref 0.61–1.24)
GFR calc Af Amer: >60 mL/min (ref >60)
GFR calc Af Amer: >60 mL/min (ref >60)
GFR calc non Af Amer: >60 mL/min (ref >60)
GFR calc non Af Amer: >60 mL/min (ref >60)
Glucose, Bld: 91 mg/dL (ref 70–99)
Glucose, Bld: 270 mg/dL — ABNORMAL HIGH (ref 70–99)
Potassium: 2.8 mmol/L — ABNORMAL LOW (ref 3.5–5.1)
Potassium: 3.8 mmol/L (ref 3.5–5.1)
Sodium: 139 mmol/L (ref 135–145)
Sodium: 140 mmol/L (ref 135–145)

## 2019-04-11 LAB — CBC
HCT: 37.9 % — ABNORMAL LOW (ref 39.0–52.0)
Hemoglobin: 11.8 g/dL — ABNORMAL LOW (ref 13.0–17.0)
MCH: 27.8 pg (ref 26.0–34.0)
MCHC: 31.1 g/dL (ref 30.0–36.0)
MCV: 89.4 fL (ref 80.0–100.0)
Platelets: 191 10*3/uL (ref 150–400)
RBC: 4.24 MIL/uL (ref 4.22–5.81)
RDW: 14.1 % (ref 11.5–15.5)
WBC: 8 10*3/uL (ref 4.0–10.5)
nRBC: 0 % (ref 0.0–0.2)

## 2019-04-11 LAB — MAGNESIUM: Magnesium: 1.7 mg/dL (ref 1.7–2.4)

## 2019-04-11 MED ORDER — POTASSIUM CHLORIDE CRYS ER 20 MEQ PO TBCR
40.0000 meq | EXTENDED_RELEASE_TABLET | ORAL | Status: AC
  Administered 2019-04-11 (×2): 40 meq via ORAL
  Filled 2019-04-11 (×2): qty 2

## 2019-04-11 MED ORDER — CYCLOBENZAPRINE HCL 5 MG PO TABS: 5.0000 mg | ORAL_TABLET | Freq: Three times a day (TID) | ORAL | Status: DC | PRN

## 2019-04-11 NOTE — Progress Notes (Signed)
Assessment & Plan: Gastric pneumatosis             Abnormal finding on CT scan of abdomen - no clinical signs or symptoms             Empiric abx Rx per medicine             Tolerating full liquid diet without pain or symptoms  WBC normal at 8.0 this AM  C diff colitis, diarrhea, hypokalemia             Per medical service  No evidence of acute surgical problem.  Will sign off.  Call if needed.        Darnell Level, MD       [facility]       Office: [phone]   Chief Complaint: Gastric pneumatosis on CT scan  Subjective: Patient in bed, responsive.  Eating full liquid breakfast.  Denies abdominal pain.  Having BM's.  Objective: Vital signs in last 24 hours: Temp:  [97.8 F (36.6 C)-98.7 F (37.1 C)] 97.8 F (36.6 C) (10/18 0611) Pulse Rate:  [60-77] 77 (10/18 0611) Resp:  [14-18] 18 (10/18 0611) BP: (117-168)/(59-74) 168/74 (10/18 0611) SpO2:  [97 %-100 %] 97 % (10/18 0611) Last BM Date: 04/09/19  Intake/Output from previous day: 10/17 0701 - 10/18 0700 In: 1019.9 [P.O.:420; IV Piggyback:599.9] Out: 200 [Urine:200] Intake/Output this shift: Total I/O In: 97.9 [IV Piggyback:97.9] Out: -   Physical Exam: HEENT - sclerae clear, mucous membranes moist Neck - soft Chest - clear bilaterally Cor - RRR Abdomen - soft without distension; BS present; non-tender Ext - no edema, non-tender   Lab Results:  Recent Labs    04/10/19 0316 04/11/19 0330  WBC 9.1 8.0  HGB 12.2* 11.8*  HCT 39.6 37.9*  PLT 202 191   BMET Recent Labs    04/10/19 1914 04/11/19 0330  NA 138 139  K 3.1* 2.8*  CL 104 104  CO2 26 26  GLUCOSE 222* 91  BUN 11 10  CREATININE 0.66 0.65  CALCIUM 8.3* 8.6*   PT/INR No results for input(s): LABPROT, INR in the last 72 hours. Comprehensive Metabolic Panel:    Component Value Date/Time   NA 139 04/11/2019 0330   NA 138 04/10/2019 1914   NA 138 07/06/2018   NA 144 05/26/2018   K 2.8 (L) 04/11/2019 0330   K 3.1 (L) 04/10/2019 1914   CL 104 04/11/2019 0330   CL 104 04/10/2019 1914   CO2 26 04/11/2019 0330   CO2 26 04/10/2019 1914   BUN 10 04/11/2019 0330   BUN 11 04/10/2019 1914   BUN 16 07/06/2018   BUN 12 05/26/2018   CREATININE 0.65 04/11/2019 0330   CREATININE 0.66 04/10/2019 1914   GLUCOSE 91 04/11/2019 0330   GLUCOSE 222 (H) 04/10/2019 1914   CALCIUM 8.6 (L) 04/11/2019 0330   CALCIUM 8.3 (L) 04/10/2019 1914   AST 21 04/09/2019 0321   AST 19 04/08/2019 1410   ALT 20 04/09/2019 0321   ALT 22 04/08/2019 1410   ALKPHOS 71 04/09/2019 0321   ALKPHOS 102 04/08/2019 1410   BILITOT 0.5 04/09/2019 0321   BILITOT 0.5 04/08/2019 1410   PROT 6.8 04/09/2019 0321   PROT 7.9 04/08/2019 1410   ALBUMIN 3.3 (L) 04/09/2019 0321   ALBUMIN 3.9 04/08/2019 1410    Studies/Results: Dg Lumbar Spine Complete  Result Date: 04/10/2019 CLINICAL DATA:  73 year old male with abdominal pain. EXAM: ABDOMEN - 1  VIEW; LUMBAR SPINE - COMPLETE 4+ VIEW COMPARISON:  CT of the abdomen pelvis dated 04/08/2019 FINDINGS: There is no bowel dilatation or evidence of obstruction. Air is noted throughout the colon. No free air identified. There is no acute fracture or subluxation of the lumbar spine. The bones are osteopenic. Chronic L1 compression fracture and vertebroplasty. Atherosclerotic calcification of the aorta. The soft tissues are grossly unremarkable. IMPRESSION: 1. No acute findings. 2. No bowel dilatation or evidence of obstruction. Electronically Signed   By: Anner Crete M.D.   On: 04/10/2019 17:07   Dg Abd 1 View  Result Date: 04/10/2019 CLINICAL DATA:  73 year old male with abdominal pain. EXAM: ABDOMEN - 1 VIEW; LUMBAR SPINE - COMPLETE 4+ VIEW COMPARISON:  CT of the abdomen pelvis dated 04/08/2019 FINDINGS: There is no bowel dilatation or evidence of obstruction. Air is noted throughout the colon. No free air identified. There is no acute fracture or subluxation of the lumbar spine. The bones are  osteopenic. Chronic L1 compression fracture and vertebroplasty. Atherosclerotic calcification of the aorta. The soft tissues are grossly unremarkable. IMPRESSION: 1. No acute findings. 2. No bowel dilatation or evidence of obstruction. Electronically Signed   By: Anner Crete M.D.   On: 04/10/2019 17:07      Armandina Gemma 04/11/2019  Patient ID: Alan Linden., male   DOB: 1945-11-07, 73 y.o.   MRN: [id number]

## 2019-04-11 NOTE — Progress Notes (Signed)
[facility]  PROGRESS NOTE  Alan [name]. RKY:[id number] DOB: [birth year] DOA: 04/08/2019 PCP: Hendricks Limes, MD  Brief History    HPI: Alan [name]. is a 73 y.o. male with medical history significant of dementia, pad, cerebral aneurysm, diabetes mellitus on insulin, stroke, MI, alcohol abuse, ileus.  Patient has cognitive impairment and is unable to provide a good history. History provided by his wife. Patient presented secondary to abdominal pain and significant diarrhea.  Diarrhea has started from the last few days.  Patient has a history of C. difficile colitis with multiple treatments.  Recently tested negative in the nursing facility.  Abdominal pain started yesterday and located mainly in the left side of his abdomen.  Unknown if anything was given for treatment.    ED Course: Vitals: T-max is 98.1 F, pulse of 75, respirations of 16, blood pressure 122/67, currently on room air Labs: Potassium of 3.0, hemoglobin of 12.8, blood sugar of 308 Imaging: CT abdomen pelvis significant for pneumatosis of the gastric wall with need to exclude emphysematous gastritis, diffuse air distention of colon concerning for ileus, air within the urinary bladder Medications/Course: Cipro, Flagyl  A & P     Abdominal pain with CT imaging suggesting colonic ileus and concern for emphysematous gastritis, improving.  Clinically remains stable, surgery signing off, repeat x-ray shows no signs of ileus or other concerning findings, not reporting any abdominal pain.  Will discontinue IV Cipro and Flagyl to decrease risk for recurrent C. difficile given prior history and monitor while continuing regular diet.    Diarrhea in patient with history of recurrent C. difficile infection, improving  C. difficile PCR here negative.  Stool output slowing down.  No ileus on abdominal XR.  Continue supportive care otherwise.     Hypokalemia in setting of diarrhea.  Magnesium within normal limit,  continue to replete orally, repeat this afternoon   Back pain.  Wife reports concern for injury to back and nursing facility.  Lumbar x-ray shows no acute fracture or lesions.  Does have history of compression fracture/vertebroplasty, Tylenol as needed, add Flexeril as needed   Type 2 diabetes, well controlled (A1c 7.7) continue home Lantus regimen, CBG, sliding scale as needed.   Dementia without behavioral disturbance, stable.  Patient is pleasant but unable provide any history, from wife at bedside reports stable mental status.  Continue Zoloft.   Insomnia, stable continue trazodone and melatonin   Chronic normocytic anemia.  Hemoglobin stable at baseline, no active signs of bleeding.  Monitor CBC   GERD, stable.  Continue PPI   BPH, stable continue Flomax    DVT prophylaxis: Lovenox Code Status: DNR Family Communication: Wife updated at bedside Disposition Plan: Continue inpatient stay with IV antibiotics, monitor abdominal pain full liquid diet, monitor stool output.     [facility] Direct contact: see [email or web address] (further directions at bottom of note if needed) 7PM-7AM contact night coverage as at bottom of note 04/11/2019, 12:54 PM  LOS: 3 days   Consultants   Surgery  Procedures   None  Antibiotics   Flagyl, ciprofloxacin IV  Interval History/Subjective  No acute complaints overnight Wife reports patient has significant improvement in abdominal pain  Objective   Vitals:  Vitals:   04/10/19 2125 04/11/19 0611  BP: (!) 147/71 (!) 168/74  Pulse: 70 77  Resp: 14 18  Temp: 98.7 F (37.1 C) 97.8 F (36.6 C)  SpO2: 100% 97%    Exam: Awake, oriented only to self, no  distress Regular rate and rhythm, no peripheral edema Clear breath sounds bilaterally, normal respiratory effort, on room air Abdomen soft, nondistended, mildly tender to deep palpation on the left, no rebound tenderness or guarding, normal bowel sounds Left BKA   I have  personally reviewed the following:   Data Reviewed: Basic Metabolic Panel: Recent Labs  Lab 04/09/19 0321 04/10/19 0316 04/10/19 0555 04/10/19 1914 04/11/19 0330  NA 142 144 147* 138 139  K 2.8* 2.5* 2.6* 3.1* 2.8*  CL 108 108 111 104 104  CO2 26 26 22 26 26   GLUCOSE 181* 99 96 222* 91  BUN 19 9 9 11 10   CREATININE 0.73 0.69 0.69 0.66 0.65  CALCIUM 8.4* 8.5* 8.6* 8.3* 8.6*  MG 1.8  --  1.9  --  1.7   Liver Function Tests: Recent Labs  Lab 04/08/19 1410 04/09/19 0321  AST 19 21  ALT 22 20  ALKPHOS 102 71  BILITOT 0.5 0.5  PROT 7.9 6.8  ALBUMIN 3.9 3.3*   Recent Labs  Lab 04/08/19 1410  LIPASE 16   No results for input(s): AMMONIA in the last 168 hours. CBC: Recent Labs  Lab 04/08/19 1410 04/09/19 0321 04/10/19 0316 04/11/19 0330  WBC 10.2 9.2 9.1 8.0  HGB 12.8* 11.7* 12.2* 11.8*  HCT 40.8 37.6* 39.6 37.9*  MCV 90.9 90.2 89.4 89.4  PLT 191 191 202 191   Cardiac Enzymes: No results for input(s): CKTOTAL, CKMB, CKMBINDEX, TROPONINI in the last 168 hours. BNP (last 3 results) No results for input(s): BNP in the last 8760 hours.  ProBNP (last 3 results) No results for input(s): PROBNP in the last 8760 hours.  CBG: Recent Labs  Lab 04/10/19 1333 04/10/19 1716 04/10/19 2344 04/11/19 0802 04/11/19 1209  GLUCAP 177* 190* 126* 115* 223*    Recent Results (from the past 240 hour(s))  SARS CORONAVIRUS 2 (TAT 6-24 HRS) Nasopharyngeal Nasopharyngeal Swab     Status: None   Collection Time: 04/08/19  4:53 PM   Specimen: Nasopharyngeal Swab  Result Value Ref Range Status   SARS Coronavirus 2 NEGATIVE NEGATIVE Final    Comment: (NOTE) SARS-CoV-2 target nucleic acids are NOT DETECTED. The SARS-CoV-2 RNA is generally detectable in upper and lower respiratory specimens during the acute phase of infection. Negative results do not preclude SARS-CoV-2 infection, do not rule out co-infections with other pathogens, and should not be used as the sole basis for  treatment or other patient management decisions. Negative results must be combined with clinical observations, patient history, and epidemiological information. The expected result is Negative. Fact Sheet for Patients: 04/13/19 Fact Sheet for Healthcare Providers: 04/10/19 This test is not yet approved or cleared by the [employer] FDA and  has been authorized for detection and/or diagnosis of SARS-CoV-2 by FDA under an Emergency Use Authorization (EUA). This EUA will remain  in effect (meaning this test can be used) for the duration of the COVID-19 declaration under Section 56 4(b)(1) of the Act, 21 U.S.C. section 360bbb-3(b)(1), unless the authorization is terminated or revoked sooner. Performed at [facility], [address] [place], [address] [place]   C difficile quick scan w PCR reflex     Status: None   Collection Time: 04/08/19  5:38 PM   Specimen: STOOL  Result Value Ref Range Status   C Diff antigen NEGATIVE NEGATIVE Final   C Diff toxin NEGATIVE NEGATIVE Final   C Diff interpretation No C. difficile detected.  Final    Comment:  Performed at [facility], [address] [place], [place] [address]     Studies: Dg Lumbar Spine Complete  Result Date: 04/10/2019 CLINICAL DATA:  [age] year old male with abdominal pain. EXAM: ABDOMEN - 1 VIEW; LUMBAR SPINE - COMPLETE 4+ VIEW COMPARISON:  CT of the abdomen pelvis dated 04/08/2019 FINDINGS: There is no bowel dilatation or evidence of obstruction. Air is noted throughout the colon. No free air identified. There is no acute fracture or subluxation of the lumbar spine. The bones are osteopenic. Chronic L1 compression fracture and vertebroplasty. Atherosclerotic calcification of the aorta. The soft tissues are grossly unremarkable. IMPRESSION: 1. No acute findings. 2. No bowel dilatation or evidence of obstruction. Electronically Signed   By:  Elgie CollardArash  Radparvar M.D.   On: 04/10/2019 17:07   Dg Abd 1 View  Result Date: 04/10/2019 CLINICAL DATA:  73 year old male with abdominal pain. EXAM: ABDOMEN - 1 VIEW; LUMBAR SPINE - COMPLETE 4+ VIEW COMPARISON:  CT of the abdomen pelvis dated 04/08/2019 FINDINGS: There is no bowel dilatation or evidence of obstruction. Air is noted throughout the colon. No free air identified. There is no acute fracture or subluxation of the lumbar spine. The bones are osteopenic. Chronic L1 compression fracture and vertebroplasty. Atherosclerotic calcification of the aorta. The soft tissues are grossly unremarkable. IMPRESSION: 1. No acute findings. 2. No bowel dilatation or evidence of obstruction. Electronically Signed   By: Elgie CollardArash  Radparvar M.D.   On: 04/10/2019 17:07    Scheduled Meds:  Chlorhexidine Gluconate Cloth  6 each Topical Daily   cholecalciferol  2,000 Units Oral Daily   enoxaparin (LOVENOX) injection  40 mg Subcutaneous QHS   fluticasone  2 spray Each Nare Daily   insulin aspart  0-9 Units Subcutaneous TID WC   insulin glargine  18 Units Subcutaneous QHS   insulin glargine  24 Units Subcutaneous Daily   Melatonin  10 mg Oral QHS   pantoprazole  40 mg Oral Daily   pneumococcal 23 valent vaccine  0.5 mL Intramuscular Tomorrow-1000   sertraline  150 mg Oral QHS   tamsulosin  0.8 mg Oral QPM   traZODone  50 mg Oral QHS   Continuous Infusions:  ciprofloxacin Stopped (04/11/19 0746)   metronidazole 500 mg (04/11/19 1120)    Active Problems:   Uncontrolled type 2 diabetes mellitus with hyperglycemia, with long-term current use of insulin (HCC)   Abdominal pain   Dementia without behavioral disturbance (HCC)   Recurrent Clostridium difficile diarrhea   Ileus (HCC)   Hypokalemia   Diarrhea, unspecified   GERD (gastroesophageal reflux disease)   BPH (benign prostatic hyperplasia)      Laverna PeaceShayla D Zariyah Stephens  [facility]

## 2019-04-11 NOTE — Progress Notes (Signed)
Pt with obvious swallowing difficulty with pills this am. Wife reports this is not a new problem and she has signed a waiver at the facility where pt lives to not use thickener. rn offered to put pills in applesauce and pt declined this request. He did a major coughing episode after take his potassium pills (that RN halved for pt). Rn will continue to monitor and make request for safe medication administration.

## 2019-04-11 NOTE — Plan of Care (Signed)
  Problem: Clinical Measurements: Goal: Will remain free from infection Outcome: Progressing   Problem: Clinical Measurements: Goal: Diagnostic test results will improve Outcome: Progressing   Problem: Clinical Measurements: Goal: Respiratory complications will improve Outcome: Progressing   Problem: Clinical Measurements: Goal: Cardiovascular complication will be avoided Outcome: Progressing   

## 2019-04-12 DIAGNOSIS — H029 Unspecified disorder of eyelid: Secondary | ICD-10-CM | POA: Diagnosis present

## 2019-04-12 DIAGNOSIS — H1031 Unspecified acute conjunctivitis, right eye: Secondary | ICD-10-CM

## 2019-04-12 LAB — BASIC METABOLIC PANEL
Anion gap: 7 (ref 5–15)
BUN: 10 mg/dL (ref 8–23)
CO2: 29 mmol/L (ref 22–32)
Calcium: 8.6 mg/dL — ABNORMAL LOW (ref 8.9–10.3)
Chloride: 102 mmol/L (ref 98–111)
Creatinine, Ser: 0.78 mg/dL (ref 0.61–1.24)
GFR calc Af Amer: >60 mL/min (ref >60)
GFR calc non Af Amer: >60 mL/min (ref >60)
Glucose, Bld: 134 mg/dL — ABNORMAL HIGH (ref 70–99)
Potassium: 3.3 mmol/L — ABNORMAL LOW (ref 3.5–5.1)
Sodium: 138 mmol/L (ref 135–145)

## 2019-04-12 LAB — GLUCOSE, CAPILLARY
Glucose-Capillary: 117 mg/dL — ABNORMAL HIGH (ref 70–99)
Glucose-Capillary: 125 mg/dL — ABNORMAL HIGH (ref 70–99)
Glucose-Capillary: 182 mg/dL — ABNORMAL HIGH (ref 70–99)
Glucose-Capillary: 148 mg/dL — ABNORMAL HIGH (ref 70–99)

## 2019-04-12 MED ORDER — POTASSIUM CHLORIDE CRYS ER 20 MEQ PO TBCR
40.0000 meq | EXTENDED_RELEASE_TABLET | Freq: Once | ORAL | Status: AC
  Administered 2019-04-12: 40 meq via ORAL
  Filled 2019-04-12: qty 2

## 2019-04-12 NOTE — Care Management Important Message (Signed)
Important Message  Patient Details IM Letter given to Velva Harman RN to present to the Patient Name: Alan [name]. MRN: [id number] Date of Birth: December 10, 1945   Medicare Important Message Given:  Yes     Kerin Salen 04/12/2019, 10:24 AM

## 2019-04-12 NOTE — Progress Notes (Signed)
[facility]  PROGRESS NOTE  Alan [name]. VOH:[id number] DOB: May 30, 1946 DOA: 04/08/2019 PCP: Hendricks Limes, MD  Brief History    HPI: Alan [name]. is a 73 y.o. male with medical history significant of dementia, pad, cerebral aneurysm, diabetes mellitus on insulin, stroke, MI, alcohol abuse, ileus.  Patient has cognitive impairment and is unable to provide a good history. History provided by his wife. Patient presented secondary to abdominal pain and significant diarrhea.  Diarrhea has started from the last few days.  Patient has a history of C. difficile colitis with multiple treatments.  Recently tested negative in the nursing facility.  Abdominal pain started yesterday and located mainly in the left side of his abdomen.  Unknown if anything was given for treatment.    ED Course: Vitals: T-max is 98.1 F, pulse of 75, respirations of 16, blood pressure 122/67, currently on room air Labs: Potassium of 3.0, hemoglobin of 12.8, blood sugar of 308 Imaging: CT abdomen pelvis significant for pneumatosis of the gastric wall with need to exclude emphysematous gastritis, diffuse air distention of colon concerning for ileus, air within the urinary bladder Medications/Course: Cipro, Flagyl  A & P     Abdominal pain with CT imaging suggesting colonic ileus and concern for emphysematous gastritis, improving.  Clinically remains stable, surgery signed off, repeat x-ray shows no signs of ileus or other concerning findings, not reporting any furtherabdominal pain.  Stable off IV Cipro and Flagyl to decrease risk for recurrent C. difficile given prior history and monitor while continuing regular diet.    Diarrhea in patient with history of recurrent C. difficile infection, improving  C. difficile PCR here negative.  Stool output slowing down, dc rectal tube and monitor output.  No ileus on abdominal XR.  Continue supportive care otherwise.     Hypokalemia in setting of diarrhea,  improving.  Magnesium within normal limit, continue to replete orally, repeat in am   Acute on chronic back pain.  Wife reports concern for injury to back and nursing facility.  Lumbar x-ray shows no acute fracture or lesions.  No new neurologic deficits does have history of compression fracture/vertebroplasty, Tylenol as needed,  Flexeril as needed   Bilateral eyelid redness, right eye improving, left eye still red in conjunctiva. Suspect irritation related to overgrown eyebrows that wife has trimmed. Difficult to assess vision since hard of hearing and difficulty following direction. Continue supportive care with as needed teardrops x3 days only (today is day2) if no meaningful improvement or worsening pain will discuss with opthalmology   Type 2 diabetes, well controlled (A1c 7.7) continue home Lantus regimen, CBG, sliding scale as needed.   Dementia without behavioral disturbance, stable.  Patient is pleasant but unable provide any history, from wife at bedside reports stable mental status.  Continue Zoloft.   Insomnia, stable continue trazodone and melatonin   Chronic normocytic anemia.  Hemoglobin stable at baseline, no active signs of bleeding.  Monitor CBC   GERD, stable.  Continue PPI   BPH, stable continue Flomax    DVT prophylaxis: Lovenox Code Status: DNR Family Communication: Wife updated at bedside Disposition Plan: Likely DC in 24 hours pending stool output, stable clinical status    [facility] Direct contact: see [email or web address] (further directions at bottom of note if needed) 7PM-7AM contact night coverage as at bottom of note 04/12/2019, 2:58 PM  LOS: 4 days   Consultants  . Surgery  Procedures  . None  Antibiotics  . Flagyl, ciprofloxacin  IV  Interval History/Subjective  No acute complaints overnight Stool output decreasing, went over documented output with nursing and wife at bedside Denies any current abdominal pain Still has back pain,  consistent with chronic Complains of some eye pain  Objective   Vitals:  Vitals:   04/12/19 0605 04/12/19 1426  BP: (!) 160/78 122/68  Pulse: 66 67  Resp: 18 20  Temp: 98.6 F (37 C) 99 F (37.2 C)  SpO2: 99% 94%    Exam: Awake, oriented only to self, no distress PEERLA, right conjunctiva injected with drainage, EOMI Regular rate and rhythm, no peripheral edema Clear breath sounds bilaterally, normal respiratory effort, on room air Abdomen soft, nondistended, nontender, no rebound tenderness or guarding, normal bowel sounds Left BKA   I have personally reviewed the following:   Data Reviewed: Basic Metabolic Panel: Recent Labs  Lab 04/09/19 0321  04/10/19 0555 04/10/19 1914 04/11/19 0330 04/11/19 1321 04/12/19 0848  NA 142   < > 147* 138 139 140 138  K 2.8*   < > 2.6* 3.1* 2.8* 3.8 3.3*  CL 108   < > 111 104 104 106 102  CO2 26   < > 22 26 26 25 29   GLUCOSE 181*   < > 96 222* 91 270* 134*  BUN 19   < > 9 11 10 9 10   CREATININE 0.73   < > 0.69 0.66 0.65 0.78 0.78  CALCIUM 8.4*   < > 8.6* 8.3* 8.6* 8.7* 8.6*  MG 1.8  --  1.9  --  1.7  --   --    < > = values in this interval not displayed.   Liver Function Tests: Recent Labs  Lab 04/08/19 1410 04/09/19 0321  AST 19 21  ALT 22 20  ALKPHOS 102 71  BILITOT 0.5 0.5  PROT 7.9 6.8  ALBUMIN 3.9 3.3*   Recent Labs  Lab 04/08/19 1410  LIPASE 16   No results for input(s): AMMONIA in the last 168 hours. CBC: Recent Labs  Lab 04/08/19 1410 04/09/19 0321 04/10/19 0316 04/11/19 0330  WBC 10.2 9.2 9.1 8.0  HGB 12.8* 11.7* 12.2* 11.8*  HCT 40.8 37.6* 39.6 37.9*  MCV 90.9 90.2 89.4 89.4  PLT 191 191 202 191   Cardiac Enzymes: No results for input(s): CKTOTAL, CKMB, CKMBINDEX, TROPONINI in the last 168 hours. BNP (last 3 results) No results for input(s): BNP in the last 8760 hours.  ProBNP (last 3 results) No results for input(s): PROBNP in the last 8760 hours.  CBG: Recent Labs  Lab 04/11/19  1209 04/11/19 1643 04/11/19 2110 04/12/19 0722 04/12/19 1132  GLUCAP 223* 193* 197* 117* 125*    Recent Results (from the past 240 hour(s))  SARS CORONAVIRUS 2 (TAT 6-24 HRS) Nasopharyngeal Nasopharyngeal Swab     Status: None   Collection Time: 04/08/19  4:53 PM   Specimen: Nasopharyngeal Swab  Result Value Ref Range Status   SARS Coronavirus 2 NEGATIVE NEGATIVE Final    Comment: (NOTE) SARS-CoV-2 target nucleic acids are NOT DETECTED. The SARS-CoV-2 RNA is generally detectable in upper and lower respiratory specimens during the acute phase of infection. Negative results do not preclude SARS-CoV-2 infection, do not rule out co-infections with other pathogens, and should not be used as the sole basis for treatment or other patient management decisions. Negative results must be combined with clinical observations, patient history, and epidemiological information. The expected result is Negative. Fact Sheet for Patients: [employer]https://www.fda.gov/media/138098/download Fact Sheet for Healthcare Providers:  quierodirigir.com This test is not yet approved or cleared by the [place] and  has been authorized for detection and/or diagnosis of SARS-CoV-2 by FDA under an Emergency Use Authorization (EUA). This EUA will remain  in effect (meaning this test can be used) for the duration of the COVID-19 declaration under Section 56 4(b)(1) of the Act, 21 U.S.C. section 360bbb-3(b)(1), unless the authorization is terminated or revoked sooner. Performed at [facility], [address], [place], [place] [address]   C difficile quick scan w PCR reflex     Status: None   Collection Time: 04/08/19  5:38 PM   Specimen: STOOL  Result Value Ref Range Status   C Diff antigen NEGATIVE NEGATIVE Final   C Diff toxin NEGATIVE NEGATIVE Final   C Diff interpretation No C. difficile detected.  Final    Comment: Performed at [facility], [address], [place], [place] [address]     Studies: Dg Lumbar Spine Complete  Result Date: 04/10/2019 CLINICAL DATA:  73 year old male with abdominal pain. EXAM: ABDOMEN - 1 VIEW; LUMBAR SPINE - COMPLETE 4+ VIEW COMPARISON:  CT of the abdomen pelvis dated 04/08/2019 FINDINGS: There is no bowel dilatation or evidence of obstruction. Air is noted throughout the colon. No free air identified. There is no acute fracture or subluxation of the lumbar spine. The bones are osteopenic. Chronic L1 compression fracture and vertebroplasty. Atherosclerotic calcification of the aorta. The soft tissues are grossly unremarkable. IMPRESSION: 1. No acute findings. 2. No bowel dilatation or evidence of obstruction. Electronically Signed   By: Elgie Collard M.D.   On: 04/10/2019 17:07   Dg Abd 1 View  Result Date: 04/10/2019 CLINICAL DATA:  73 year old male with abdominal pain. EXAM: ABDOMEN - 1 VIEW; LUMBAR SPINE - COMPLETE 4+ VIEW COMPARISON:  CT of the abdomen pelvis dated 04/08/2019 FINDINGS: There is no bowel dilatation or evidence of obstruction. Air is noted throughout the colon. No free air identified. There is no acute fracture or subluxation of the lumbar spine. The bones are osteopenic. Chronic L1 compression fracture and vertebroplasty. Atherosclerotic calcification of the aorta. The soft tissues are grossly unremarkable. IMPRESSION: 1. No acute findings. 2. No bowel dilatation or evidence of obstruction. Electronically Signed   By: Elgie Collard M.D.   On: 04/10/2019 17:07    Scheduled Meds: . Chlorhexidine Gluconate Cloth  6 each Topical Daily  . cholecalciferol  2,000 Units Oral Daily  . enoxaparin (LOVENOX) injection  40 mg Subcutaneous QHS  . fluticasone  2 spray Each Nare Daily  . insulin aspart  0-9 Units Subcutaneous TID WC  . insulin glargine  18 Units Subcutaneous QHS  . insulin glargine  24 Units Subcutaneous Daily  . Melatonin  10 mg Oral QHS  . pantoprazole  40 mg Oral Daily  .  pneumococcal 23 valent vaccine  0.5 mL Intramuscular Tomorrow-1000  . sertraline  150 mg Oral QHS  . tamsulosin  0.8 mg Oral QPM  . traZODone  50 mg Oral QHS   Continuous Infusions:   Active Problems:   Uncontrolled type 2 diabetes mellitus with hyperglycemia, with long-term current use of insulin (HCC)   Abdominal pain   Dementia without behavioral disturbance (HCC)   Recurrent Clostridium difficile diarrhea   Ileus (HCC)   Hypokalemia   Diarrhea, unspecified   GERD (gastroesophageal reflux disease)   BPH (benign prostatic hyperplasia)      Laverna Peace  [facility]

## 2019-04-13 DIAGNOSIS — Z89421 Acquired absence of other right toe(s): Secondary | ICD-10-CM | POA: Diagnosis not present

## 2019-04-13 DIAGNOSIS — A0472 Enterocolitis due to Clostridium difficile, not specified as recurrent: Secondary | ICD-10-CM | POA: Diagnosis not present

## 2019-04-13 DIAGNOSIS — M545 Low back pain: Secondary | ICD-10-CM | POA: Diagnosis not present

## 2019-04-13 DIAGNOSIS — Z79899 Other long term (current) drug therapy: Secondary | ICD-10-CM | POA: Diagnosis not present

## 2019-04-13 DIAGNOSIS — K567 Ileus, unspecified: Secondary | ICD-10-CM | POA: Diagnosis not present

## 2019-04-13 DIAGNOSIS — G8929 Other chronic pain: Secondary | ICD-10-CM

## 2019-04-13 DIAGNOSIS — K219 Gastro-esophageal reflux disease without esophagitis: Secondary | ICD-10-CM | POA: Diagnosis not present

## 2019-04-13 DIAGNOSIS — R197 Diarrhea, unspecified: Secondary | ICD-10-CM | POA: Diagnosis not present

## 2019-04-13 DIAGNOSIS — Z7401 Bed confinement status: Secondary | ICD-10-CM | POA: Diagnosis not present

## 2019-04-13 DIAGNOSIS — F0391 Unspecified dementia with behavioral disturbance: Secondary | ICD-10-CM | POA: Diagnosis not present

## 2019-04-13 DIAGNOSIS — M2550 Pain in unspecified joint: Secondary | ICD-10-CM | POA: Diagnosis not present

## 2019-04-13 DIAGNOSIS — F0390 Unspecified dementia without behavioral disturbance: Secondary | ICD-10-CM | POA: Diagnosis not present

## 2019-04-13 DIAGNOSIS — R52 Pain, unspecified: Secondary | ICD-10-CM | POA: Diagnosis not present

## 2019-04-13 DIAGNOSIS — K6289 Other specified diseases of anus and rectum: Secondary | ICD-10-CM | POA: Diagnosis not present

## 2019-04-13 DIAGNOSIS — Z20828 Contact with and (suspected) exposure to other viral communicable diseases: Secondary | ICD-10-CM | POA: Diagnosis not present

## 2019-04-13 DIAGNOSIS — A498 Other bacterial infections of unspecified site: Secondary | ICD-10-CM | POA: Diagnosis not present

## 2019-04-13 DIAGNOSIS — L853 Xerosis cutis: Secondary | ICD-10-CM | POA: Diagnosis not present

## 2019-04-13 DIAGNOSIS — H109 Unspecified conjunctivitis: Secondary | ICD-10-CM | POA: Diagnosis not present

## 2019-04-13 DIAGNOSIS — R278 Other lack of coordination: Secondary | ICD-10-CM | POA: Diagnosis not present

## 2019-04-13 DIAGNOSIS — R100 Acute abdomen: Secondary | ICD-10-CM | POA: Diagnosis not present

## 2019-04-13 DIAGNOSIS — Z794 Long term (current) use of insulin: Secondary | ICD-10-CM | POA: Diagnosis not present

## 2019-04-13 DIAGNOSIS — Z23 Encounter for immunization: Secondary | ICD-10-CM | POA: Diagnosis not present

## 2019-04-13 DIAGNOSIS — Z89512 Acquired absence of left leg below knee: Secondary | ICD-10-CM | POA: Diagnosis not present

## 2019-04-13 DIAGNOSIS — H5789 Other specified disorders of eye and adnexa: Secondary | ICD-10-CM | POA: Diagnosis present

## 2019-04-13 DIAGNOSIS — F419 Anxiety disorder, unspecified: Secondary | ICD-10-CM | POA: Diagnosis not present

## 2019-04-13 DIAGNOSIS — M549 Dorsalgia, unspecified: Secondary | ICD-10-CM | POA: Diagnosis not present

## 2019-04-13 DIAGNOSIS — E870 Hyperosmolality and hypernatremia: Secondary | ICD-10-CM | POA: Diagnosis not present

## 2019-04-13 DIAGNOSIS — U071 COVID-19: Secondary | ICD-10-CM | POA: Diagnosis not present

## 2019-04-13 DIAGNOSIS — R1084 Generalized abdominal pain: Secondary | ICD-10-CM | POA: Diagnosis not present

## 2019-04-13 DIAGNOSIS — E11618 Type 2 diabetes mellitus with other diabetic arthropathy: Secondary | ICD-10-CM | POA: Diagnosis not present

## 2019-04-13 DIAGNOSIS — R1030 Lower abdominal pain, unspecified: Secondary | ICD-10-CM | POA: Diagnosis not present

## 2019-04-13 DIAGNOSIS — F329 Major depressive disorder, single episode, unspecified: Secondary | ICD-10-CM | POA: Diagnosis not present

## 2019-04-13 DIAGNOSIS — Z7982 Long term (current) use of aspirin: Secondary | ICD-10-CM | POA: Diagnosis not present

## 2019-04-13 DIAGNOSIS — E559 Vitamin D deficiency, unspecified: Secondary | ICD-10-CM | POA: Diagnosis not present

## 2019-04-13 DIAGNOSIS — E1142 Type 2 diabetes mellitus with diabetic polyneuropathy: Secondary | ICD-10-CM | POA: Diagnosis not present

## 2019-04-13 DIAGNOSIS — I10 Essential (primary) hypertension: Secondary | ICD-10-CM | POA: Diagnosis not present

## 2019-04-13 DIAGNOSIS — I739 Peripheral vascular disease, unspecified: Secondary | ICD-10-CM | POA: Diagnosis not present

## 2019-04-13 DIAGNOSIS — E119 Type 2 diabetes mellitus without complications: Secondary | ICD-10-CM | POA: Diagnosis not present

## 2019-04-13 DIAGNOSIS — D649 Anemia, unspecified: Secondary | ICD-10-CM | POA: Diagnosis not present

## 2019-04-13 DIAGNOSIS — R1312 Dysphagia, oropharyngeal phase: Secondary | ICD-10-CM | POA: Diagnosis not present

## 2019-04-13 DIAGNOSIS — Z8673 Personal history of transient ischemic attack (TIA), and cerebral infarction without residual deficits: Secondary | ICD-10-CM | POA: Diagnosis not present

## 2019-04-13 DIAGNOSIS — N400 Enlarged prostate without lower urinary tract symptoms: Secondary | ICD-10-CM | POA: Diagnosis not present

## 2019-04-13 DIAGNOSIS — E785 Hyperlipidemia, unspecified: Secondary | ICD-10-CM | POA: Diagnosis not present

## 2019-04-13 DIAGNOSIS — M6281 Muscle weakness (generalized): Secondary | ICD-10-CM | POA: Diagnosis not present

## 2019-04-13 DIAGNOSIS — J988 Other specified respiratory disorders: Secondary | ICD-10-CM | POA: Diagnosis not present

## 2019-04-13 DIAGNOSIS — R140 Abdominal distension (gaseous): Secondary | ICD-10-CM | POA: Diagnosis not present

## 2019-04-13 DIAGNOSIS — Z03818 Encounter for observation for suspected exposure to other biological agents ruled out: Secondary | ICD-10-CM | POA: Diagnosis not present

## 2019-04-13 DIAGNOSIS — G4700 Insomnia, unspecified: Secondary | ICD-10-CM | POA: Diagnosis not present

## 2019-04-13 DIAGNOSIS — I2510 Atherosclerotic heart disease of native coronary artery without angina pectoris: Secondary | ICD-10-CM | POA: Diagnosis not present

## 2019-04-13 DIAGNOSIS — A0471 Enterocolitis due to Clostridium difficile, recurrent: Secondary | ICD-10-CM | POA: Diagnosis not present

## 2019-04-13 DIAGNOSIS — K529 Noninfective gastroenteritis and colitis, unspecified: Secondary | ICD-10-CM | POA: Diagnosis not present

## 2019-04-13 DIAGNOSIS — R531 Weakness: Secondary | ICD-10-CM | POA: Diagnosis not present

## 2019-04-13 DIAGNOSIS — K2970 Gastritis, unspecified, without bleeding: Secondary | ICD-10-CM | POA: Diagnosis not present

## 2019-04-13 DIAGNOSIS — Z7984 Long term (current) use of oral hypoglycemic drugs: Secondary | ICD-10-CM | POA: Diagnosis not present

## 2019-04-13 DIAGNOSIS — I700 Atherosclerosis of aorta: Secondary | ICD-10-CM | POA: Diagnosis not present

## 2019-04-13 DIAGNOSIS — R231 Pallor: Secondary | ICD-10-CM | POA: Diagnosis not present

## 2019-04-13 DIAGNOSIS — Z880 Allergy status to penicillin: Secondary | ICD-10-CM | POA: Diagnosis not present

## 2019-04-13 DIAGNOSIS — E1165 Type 2 diabetes mellitus with hyperglycemia: Secondary | ICD-10-CM | POA: Diagnosis not present

## 2019-04-13 DIAGNOSIS — E876 Hypokalemia: Secondary | ICD-10-CM | POA: Diagnosis not present

## 2019-04-13 DIAGNOSIS — E639 Nutritional deficiency, unspecified: Secondary | ICD-10-CM | POA: Diagnosis not present

## 2019-04-13 LAB — BASIC METABOLIC PANEL
Anion gap: 8 (ref 5–15)
BUN: 12 mg/dL (ref 8–23)
CO2: 28 mmol/L (ref 22–32)
Calcium: 8.5 mg/dL — ABNORMAL LOW (ref 8.9–10.3)
Chloride: 103 mmol/L (ref 98–111)
Creatinine, Ser: 0.83 mg/dL (ref 0.61–1.24)
GFR calc Af Amer: >60 mL/min (ref >60)
GFR calc non Af Amer: >60 mL/min (ref >60)
Glucose, Bld: 146 mg/dL — ABNORMAL HIGH (ref 70–99)
Potassium: 3.6 mmol/L (ref 3.5–5.1)
Sodium: 139 mmol/L (ref 135–145)

## 2019-04-13 LAB — GLUCOSE, CAPILLARY
Glucose-Capillary: 130 mg/dL — ABNORMAL HIGH (ref 70–99)
Glucose-Capillary: 161 mg/dL — ABNORMAL HIGH (ref 70–99)

## 2019-04-13 MED ORDER — BACLOFEN 5 MG PO TABS: 5.0000 mg | ORAL_TABLET | Freq: Three times a day (TID) | ORAL | Status: AC | PRN

## 2019-04-13 MED ORDER — ACETAMINOPHEN 325 MG PO TABS: 650.0000 mg | ORAL_TABLET | Freq: Four times a day (QID) | ORAL | Status: AC | PRN

## 2019-04-13 MED ORDER — POLYVINYL ALCOHOL 1.4 % OP SOLN: 1.0000 [drp] | Freq: Four times a day (QID) | OPHTHALMIC | 0 refills | Status: AC

## 2019-04-13 MED ORDER — POLYVINYL ALCOHOL 1.4 % OP SOLN
1.0000 [drp] | Freq: Four times a day (QID) | OPHTHALMIC | Status: DC
  Administered 2019-04-13 (×2): 1 [drp] via OPHTHALMIC
  Filled 2019-04-13: qty 15

## 2019-04-13 NOTE — Discharge Summary (Signed)
Alan [name]. TGG:[id number] DOB: [birth year] DOA: 04/08/2019  PCP: Hendricks Limes, MD  Admit date: 04/08/2019 Discharge date: 04/13/2019  Admitted From: SNF Disposition:  SNF  Recommendations for Outpatient Follow-up:  1. Follow up with PCP in 1-2 weeks 2. Please obtain BMP/CBC in one week 3. Continue scheduled eyedrops 4 times daily, if no improvement in eye redness/burning sensation in 1 week to make referral to outpatient ophthalmologist for further review  Home Health: No Equipment/Devices: None  Discharge Condition stable CODE STATUS: DNR) Diet recommendation: Heart Healthy / Carb Modified / Regular / Dysphagia   Brief/Interim Summary: History of present illness:  Alan [name].is a 73 y.o.malewith medical history significant ofdementia,pad, cerebral aneurysm,diabetes mellitus on insulin, stroke, MI, alcohol abuse, ileus.Patient has cognitive impairment and is unable to provide a good history.History provided by his wife. Patient presented secondary to abdominal pain and significant diarrhea. Diarrhea has started from the last few days. Patient has a history of C. difficile colitis with multiple treatments. Recently tested negative in the nursing facility. Abdominal pain started yesterday and located mainly in the left side of his abdomen. Unknown if anything was given for treatment.   ED Course: Vitals:T-max is 98.1 F, pulse of 75, respirations of 16, blood pressure 122/67, currently on room air Labs:Potassium of 3.0, hemoglobin of 12.8, blood sugar of 308 Imaging:CT abdomen pelvis significant for pneumatosis of the gastric wall with need to exclude emphysematous gastritis, diffuse air distention of colon concerningfor ileus, air within the urinary bladder Medications/Course:Cipro, Flagyl  Remaining hospital course addressed in problem based format below:   Hospital Course:    Abdominal pain with CT imaging suggesting colonic ileus and  concern for emphysematous gastritis, resolved.    Abdominal pain improve with short course of IV Cipro and Flagyl.  Patient did not require any surgical intervention based off improvement clinically.  Repeat abdominal x-ray showed no signs of ileus, patient tolerated normal diet prior to discharge.  Patient does not need to continue oral antibiotics closely given history of recurrent C. difficile.  .    Diarrhea in patient with history of recurrent C. difficile infection, improving  C. difficile PCR here negative.  Stool output slowing down and none in last 24 hours after dcing rectal tube on 10/19. abd exam remains non-toxic.   No ileus on abdominal XR.  Continue supportive care otherwise off antibiotics   Hypokalemia in setting of diarrhea, improving.    Normalized with repletion resolution of diarrhea   Acute on chronic back pain, stable.  Lumbar x-ray shows no acute fracture or lesions.  No new neurologic deficits does have history of compression fracture/vertebroplasty, Tylenol as needed,  Flexeril as needed   Bilateral eyelid redness and pain, left side greater than right, improving. Suspect irritation related to overgrown eyebrows that wife has trimmed. Has improved here with eye drops Discussed with Dr. Alanda Slim with ophthalmology (10/20) who recommends scheduling tear eye drops 4 times daily. And if no continued improvement in the next week or  can make referral for outpatient follow up with ophthalmology   Type 2 diabetes, well controlled (A1c 7.7) continue home Lantus regimen,    Dementia without behavioral disturbance, stable.  stable mental status.  Continue Zoloft.   Insomnia, stable continue trazodone and melatonin   Chronic normocytic anemia.  Hemoglobin stable at baseline, no active signs of bleeding.    GERD, stable.  Continue PPI   BPH, stable continue Flomax   Consultations:  Surgery   Procedures/Studies: none  Subjective: Eyes still burning No  belly pain  Discharge Exam: Vitals:   04/12/19 2218 04/13/19 0617  BP: (!) 150/77 (!) 148/74  Pulse: 72 62  Resp: 18 18  Temp: 98.9 F (37.2 C) 98.2 F (36.8 C)  SpO2: 99% 97%   Vitals:   04/12/19 0605 04/12/19 1426 04/12/19 2218 04/13/19 0617  BP: (!) 160/78 122/68 (!) 150/77 (!) 148/74  Pulse: 66 67 72 62  Resp: Temp: 98.6 F (37 C) 99 F (37.2 C) 98.9 F (37.2 C) 98.2 F (36.8 C)  TempSrc: Oral Oral Oral Oral  SpO2: 99% 94% 99% 97%  Weight:      Height:       Awake, oriented only to self, no distress PEERLA, left conjunctiva still injected (seems slightly improved from yesterday), right eye wnl Left eye 04/13/19    Right eye (04/13/19)    Regular rate and rhythm, no peripheral edema Clear breath sounds bilaterally, normal respiratory effort, on room air Abdomen soft, nondistended, nontender, no rebound tenderness or guarding, normal bowel sounds Left BKA Discharge Diagnoses:  Active Problems:   Uncontrolled type 2 diabetes mellitus with hyperglycemia, with long-term current use of insulin (HCC)   Abdominal pain   Dementia without behavioral disturbance (HCC)   Recurrent Clostridium difficile diarrhea   Ileus (HCC)   Hypokalemia   Diarrhea, unspecified   GERD (gastroesophageal reflux disease)   BPH (benign prostatic hyperplasia)   Abnormality of eyelid   Redness of both eyes    Discharge Instructions  Discharge Instructions    Diet - low sodium heart healthy   Complete by: As directed    Increase activity slowly   Complete by: As directed      Allergies as of 04/13/2019      Reactions   Penicillins Rash   Has patient had a PCN reaction causing immediate rash, facial/tongue/throat swelling, SOB or lightheadedness with hypotension: Yes Has patient had a PCN reaction causing severe rash involving mucus membranes or skin necrosis: No Has patient had a PCN reaction that required hospitalization No Has patient had a PCN reaction  occurring within the last 10 years: No If all of the above answers are "NO", then may proceed with Cephalosporin use.      Medication List    TAKE these medications   acetaminophen 325 MG tablet Commonly known as: TYLENOL Take 2 tablets (650 mg total) by mouth every 6 (six) hours as needed for mild pain (or Fever >/= 101).   Baclofen 5 MG Tabs Take 5 mg by mouth 3 (three) times daily as needed.   CALAZIME SKIN PROTECTANT EX Apply 1 application topically 2 (two) times daily.   fluticasone 50 MCG/ACT nasal spray Commonly known as: FLONASE Place 2 sprays into both nostrils daily.   geriatric multivitamins-minerals Liqd Take 15 mLs by mouth 2 (two) times daily.   insulin glargine 100 UNIT/ML injection Commonly known as: LANTUS Inject 18-24 Units into the skin See admin instructions. Use 18 units at bedtime & 24 units in morning   KETOCONAZOLE (TOPICAL) 1 % Sham Apply 1 application topically 2 (two) times a week. Monday and thursday   Melatonin 10 MG Tabs Take 10 mg by mouth at bedtime.   Metamucil Fiber 51.7 % Pack Generic drug: Psyllium Take 1 Package by mouth 2 (two) times daily.   NovoLOG 100 UNIT/ML injection Generic drug: insulin aspart Inject 2-10 Units into the skin See admin instructions. If BS is 1-150 0U 151-200 2U  201-250 4U 251-300 6U 301-400 10U 400 CALL MD   pantoprazole 40 MG tablet Commonly known as: PROTONIX Take 1 tablet (40 mg total) by mouth daily.   polyvinyl alcohol 1.4 % ophthalmic solution Commonly known as: LIQUIFILM TEARS Place 1 drop into both eyes 4 (four) times daily.   QC Vitamin D3 50 MCG (2000 UT) Tabs Generic drug: Cholecalciferol Take 2,000 Units by mouth daily.   RISA-BID PROBIOTIC PO Take 1 capsule by mouth 2 (two) times daily.   sertraline 50 MG tablet Commonly known as: ZOLOFT Take 150 mg by mouth at bedtime.   tamsulosin 0.4 MG Caps capsule Commonly known as: FLOMAX Take 0.8 mg by mouth every evening.   traZODone  50 MG tablet Commonly known as: DESYREL Take 50 mg by mouth at bedtime.       Allergies  Allergen Reactions  . Penicillins Rash    Has patient had a PCN reaction causing immediate rash, facial/tongue/throat swelling, SOB or lightheadedness with hypotension: Yes Has patient had a PCN reaction causing severe rash involving mucus membranes or skin necrosis: No Has patient had a PCN reaction that required hospitalization No Has patient had a PCN reaction occurring within the last 10 years: No If all of the above answers are "NO", then may proceed with Cephalosporin use.         The results of significant diagnostics from this hospitalization (including imaging, microbiology, ancillary and laboratory) are listed below for reference.     Microbiology: Recent Results (from the past 240 hour(s))  SARS CORONAVIRUS 2 (TAT 6-24 HRS) Nasopharyngeal Nasopharyngeal Swab     Status: None   Collection Time: 04/08/19  4:53 PM   Specimen: Nasopharyngeal Swab  Result Value Ref Range Status   SARS Coronavirus 2 NEGATIVE NEGATIVE Final    Comment: (NOTE) SARS-CoV-2 target nucleic acids are NOT DETECTED. The SARS-CoV-2 RNA is generally detectable in upper and lower respiratory specimens during the acute phase of infection. Negative results do not preclude SARS-CoV-2 infection, do not rule out co-infections with other pathogens, and should not be used as the sole basis for treatment or other patient management decisions. Negative results must be combined with clinical observations, patient history, and epidemiological information. The expected result is Negative. Fact Sheet for Patients: HairSlick.nohttps://www.fda.gov/media/138098/download Fact Sheet for Healthcare Providers: quierodirigir.comhttps://www.fda.gov/media/138095/download This test is not yet approved or cleared by the [place] FDA and  has been authorized for detection and/or diagnosis of SARS-CoV-2 by FDA under an Emergency Use Authorization (EUA).  This EUA will remain  in effect (meaning this test can be used) for the duration of the COVID-19 declaration under Section 56 4(b)(1) of the Act, 21 U.S.C. section 360bbb-3(b)(1), unless the authorization is terminated or revoked sooner. Performed at [facility], [address] [place], [place] [address]   C difficile quick scan w PCR reflex     Status: None   Collection Time: 04/08/19  5:38 PM   Specimen: STOOL  Result Value Ref Range Status   C Diff antigen NEGATIVE NEGATIVE Final   C Diff toxin NEGATIVE NEGATIVE Final   C Diff interpretation No C. difficile detected.  Final    Comment: Performed at [facility], [address] [place], [place] [address]     Labs: BNP (last 3 results) No results for input(s): BNP in the last 8760 hours. Basic Metabolic Panel: Recent Labs  Lab 04/09/19 0321  04/10/19 0555 04/10/19 1914 04/11/19 0330 04/11/19 1321 04/12/19 0848 04/13/19 [id number]  NA 142   < > 147* 138 139 140 138 139  K 2.8*   < > 2.6* 3.1* 2.8* 3.8 3.3* 3.6  CL 108   < > 111 104 104 106 102 103  CO2 26   < > 22 26 26 25 29 28   GLUCOSE 181*   < > 96 222* 91 270* 134* 146*  BUN 19   < > 9 11 10 9 10 12   CREATININE 0.73   < > 0.69 0.66 0.65 0.78 0.78 0.83  CALCIUM 8.4*   < > 8.6* 8.3* 8.6* 8.7* 8.6* 8.5*  MG 1.8  --  1.9  --  1.7  --   --   --    < > = values in this interval not displayed.   Liver Function Tests: Recent Labs  Lab 04/08/19 1410 04/09/19 0321  AST 19 21  ALT 22 20  ALKPHOS 102 71  BILITOT 0.5 0.5  PROT 7.9 6.8  ALBUMIN 3.9 3.3*   Recent Labs  Lab 04/08/19 1410  LIPASE 16   No results for input(s): AMMONIA in the last 168 hours. CBC: Recent Labs  Lab 04/08/19 1410 04/09/19 0321 04/10/19 0316 04/11/19 0330  WBC 10.2 9.2 9.1 8.0  HGB 12.8* 11.7* 12.2* 11.8*  HCT 40.8 37.6* 39.6 37.9*  MCV 90.9 90.2 89.4 89.4  PLT 191 191 202 191   Cardiac Enzymes: No results for input(s): CKTOTAL, CKMB, CKMBINDEX, TROPONINI  in the last 168 hours. BNP: Invalid input(s): POCBNP CBG: Recent Labs  Lab 04/12/19 1132 04/12/19 1824 04/12/19 2215 04/13/19 0722 04/13/19 1139  GLUCAP 125* 182* 148* 130* 161*   D-Dimer No results for input(s): DDIMER in the last 72 hours. Hgb A1c No results for input(s): HGBA1C in the last 72 hours. Lipid Profile No results for input(s): CHOL, HDL, LDLCALC, TRIG, CHOLHDL, LDLDIRECT in the last 72 hours. Thyroid function studies No results for input(s): TSH, T4TOTAL, T3FREE, THYROIDAB in the last 72 hours.  Invalid input(s): FREET3 Anemia work up No results for input(s): VITAMINB12, FOLATE, FERRITIN, TIBC, IRON, RETICCTPCT in the last 72 hours. Urinalysis    Component Value Date/Time   COLORURINE YELLOW 04/08/2019 1900   APPEARANCEUR HAZY (A) 04/08/2019 1900   LABSPEC >1.046 (H) 04/08/2019 1900   PHURINE 5.0 04/08/2019 1900   GLUCOSEU NEGATIVE 04/08/2019 1900   HGBUR SMALL (A) 04/08/2019 1900   BILIRUBINUR NEGATIVE 04/08/2019 1900   KETONESUR NEGATIVE 04/08/2019 1900   PROTEINUR NEGATIVE 04/08/2019 1900   NITRITE NEGATIVE 04/08/2019 1900   LEUKOCYTESUR MODERATE (A) 04/08/2019 1900   Sepsis Labs Invalid input(s): PROCALCITONIN,  WBC,  LACTICIDVEN Microbiology Recent Results (from the past 240 hour(s))  SARS CORONAVIRUS 2 (TAT 6-24 HRS) Nasopharyngeal Nasopharyngeal Swab     Status: None   Collection Time: 04/08/19  4:53 PM   Specimen: Nasopharyngeal Swab  Result Value Ref Range Status   SARS Coronavirus 2 NEGATIVE NEGATIVE Final    Comment: (NOTE) SARS-CoV-2 target nucleic acids are NOT DETECTED. The SARS-CoV-2 RNA is generally detectable in upper and lower respiratory specimens during the acute phase of infection. Negative results do not preclude SARS-CoV-2 infection, do not rule out co-infections with other pathogens, and should not be used as the sole basis for treatment or other patient management decisions. Negative results must be combined with clinical  observations, patient history, and epidemiological information. The expected result is Negative. Fact Sheet for Patients: 04/10/2019 Fact Sheet for Healthcare Providers: 04/10/19 This test is not yet approved or cleared by  the [employer] and  has been authorized for detection and/or diagnosis of SARS-CoV-2 by FDA under an Emergency Use Authorization (EUA). This EUA will remain  in effect (meaning this test can be used) for the duration of the COVID-19 declaration under Section 56 4(b)(1) of the Act, 21 U.S.C. section 360bbb-3(b)(1), unless the authorization is terminated or revoked sooner. Performed at [facility], [address], [place], [place] [address]   C difficile quick scan w PCR reflex     Status: None   Collection Time: 04/08/19  5:38 PM   Specimen: STOOL  Result Value Ref Range Status   C Diff antigen NEGATIVE NEGATIVE Final   C Diff toxin NEGATIVE NEGATIVE Final   C Diff interpretation No C. difficile detected.  Final    Comment: Performed at [facility], [address] [place], [place] [address]     Time coordinating discharge: Over 30 minutes  SIGNED:   Laverna Peace, MD  [facility] 04/13/2019, 12:58 PM Pager   If 7PM-7AM, please contact night-coverage [email or web address] Password [email or web address]

## 2019-04-13 NOTE — Progress Notes (Signed)
[facility]  PROGRESS NOTE  Alan SeedsGlenn A Barz Jr. OZH:[id number] DOB: 10-09-1945 DOA: 04/08/2019 PCP: Pecola LawlessHopper, William F, MD  Brief History    HPI: Alan SeedsGlenn A Loeffelholz Jr. is a 73 y.o. male with medical history significant of dementia, pad, cerebral aneurysm, diabetes mellitus on insulin, stroke, MI, alcohol abuse, ileus.  Patient has cognitive impairment and is unable to provide a good history. History provided by his wife. Patient presented secondary to abdominal pain and significant diarrhea.  Diarrhea has started from the last few days.  Patient has a history of C. difficile colitis with multiple treatments.  Recently tested negative in the nursing facility.  Abdominal pain started yesterday and located mainly in the left side of his abdomen.  Unknown if anything was given for treatment.    ED Course: Vitals: T-max is 98.1 F, pulse of 75, respirations of 16, blood pressure 122/67, currently on room air Labs: Potassium of 3.0, hemoglobin of 12.8, blood sugar of 308 Imaging: CT abdomen pelvis significant for pneumatosis of the gastric wall with need to exclude emphysematous gastritis, diffuse air distention of colon concerning for ileus, air within the urinary bladder Medications/Course: Cipro, Flagyl  A & P     Abdominal pain with CT imaging suggesting colonic ileus and concern for emphysematous gastritis, resolved.  Clinically remains stable, surgery signed off, repeat x-ray shows no signs of ileus or other concerning findings, not reporting any further abdominal pain.  Stable off IV Cipro and Flagyl to decrease risk for recurrent C. difficile given prior history and monitor while continuing regular diet.    Diarrhea in patient with history of recurrent C. difficile infection, improving  C. difficile PCR here negative.  Stool output slowing down and none in last 24 hours after dcing rectal tube on 10/19. abd exam remains non-toxic.   No ileus on abdominal XR.  Continue supportive care  otherwise off antibiotics   Hypokalemia in setting of diarrhea, improving.  Magnesium within normal limit, continue to replete orally as needed   Acute on chronic back pain, stable.  Wife reports concern for injury to back and nursing facility.  Lumbar x-ray shows no acute fracture or lesions.  No new neurologic deficits does have history of compression fracture/vertebroplasty, Tylenol as needed,  Flexeril as needed   Bilateral eyelid redness and pain, right eye improving, left eye still red in conjunctiva. Suspect irritation related to overgrown eyebrows that wife has trimmed. Reports persistent pain. Difficult to assess vision since hard of hearing and difficulty following direction. Redness seems to be slightly improved with PRN use of liquifilm tears for lubrication ( started 10/17) but persistent pain,Discussed with Dr. Genia DelMincey with ophthalmology (10/20) who recommends scheduling tear eye drops 4 times daily. And if no continued improvement in the week or so can follow up with ophthalmology   Type 2 diabetes, well controlled (A1c 7.7) continue home Lantus regimen, CBG, sliding scale as needed.   Dementia without behavioral disturbance, stable.  Patient is pleasant but unable provide any history, from wife at bedside reports stable mental status.  Continue Zoloft.   Insomnia, stable continue trazodone and melatonin   Chronic normocytic anemia.  Hemoglobin stable at baseline, no active signs of bleeding.  Monitor CBC   GERD, stable.  Continue PPI   BPH, stable continue Flomax    DVT prophylaxis: Lovenox Code Status: DNR Family Communication: Wife updated at bedside Disposition Plan: Medically stable for discharge, obtaining Covid test before returning to skilled nursing facility    [facility]  Direct contact: see [email or web address] (further directions at bottom of note if needed) 7PM-7AM contact night coverage as at bottom of note 04/13/2019, 10:40 AM  LOS: 5 days    Consultants  . Surgery  Procedures  . None  Antibiotics  . Flagyl, ciprofloxacin IV  Interval History/Subjective  Reports burning eye pain Denies any changes in vision No abdominal pain No changes in chronic back pain  Objective   Vitals:  Vitals:   04/12/19 2218 04/13/19 0617  BP: (!) 150/77 (!) 148/74  Pulse: 72 62  Resp: 18 18  Temp: 98.9 F (37.2 C) 98.2 F (36.8 C)  SpO2: 99% 97%    Exam: Awake, oriented only to self, no distress PEERLA, left conjunctiva still injected (seems slightly improved from yesterday), right eye wnl Left eye 04/13/19    Right eye (04/13/19)    Regular rate and rhythm, no peripheral edema Clear breath sounds bilaterally, normal respiratory effort, on room air Abdomen soft, nondistended, nontender, no rebound tenderness or guarding, normal bowel sounds Left BKA   I have personally reviewed the following:   Data Reviewed: Basic Metabolic Panel: Recent Labs  Lab 04/09/19 0321  04/10/19 0555 04/10/19 1914 04/11/19 0330 04/11/19 1321 04/12/19 0848 04/13/19 0349  NA 142   < > 147* 138 139 140 138 139  K 2.8*   < > 2.6* 3.1* 2.8* 3.8 3.3* 3.6  CL 108   < > 111 104 104 106 102 103  CO2 26   < > 22 26 26 25 29 28   GLUCOSE 181*   < > 96 222* 91 270* 134* 146*  BUN 19   < > 9 11 10 9 10 12   CREATININE 0.73   < > 0.69 0.66 0.65 0.78 0.78 0.83  CALCIUM 8.4*   < > 8.6* 8.3* 8.6* 8.7* 8.6* 8.5*  MG 1.8  --  1.9  --  1.7  --   --   --    < > = values in this interval not displayed.   Liver Function Tests: Recent Labs  Lab 04/08/19 1410 04/09/19 0321  AST 19 21  ALT 22 20  ALKPHOS 102 71  BILITOT 0.5 0.5  PROT 7.9 6.8  ALBUMIN 3.9 3.3*   Recent Labs  Lab 04/08/19 1410  LIPASE 16   No results for input(s): AMMONIA in the last 168 hours. CBC: Recent Labs  Lab 04/08/19 1410 04/09/19 0321 04/10/19 0316 04/11/19 0330  WBC 10.2 9.2 9.1 8.0  HGB 12.8* 11.7* 12.2* 11.8*  HCT 40.8 37.6* 39.6 37.9*  MCV 90.9 90.2  89.4 89.4  PLT 191 191 202 191   Cardiac Enzymes: No results for input(s): CKTOTAL, CKMB, CKMBINDEX, TROPONINI in the last 168 hours. BNP (last 3 results) No results for input(s): BNP in the last 8760 hours.  ProBNP (last 3 results) No results for input(s): PROBNP in the last 8760 hours.  CBG: Recent Labs  Lab 04/12/19 0722 04/12/19 1132 04/12/19 1824 04/12/19 2215 04/13/19 0722  GLUCAP 117* 125* 182* 148* 130*    Recent Results (from the past 240 hour(s))  SARS CORONAVIRUS 2 (TAT 6-24 HRS) Nasopharyngeal Nasopharyngeal Swab     Status: None   Collection Time: 04/08/19  4:53 PM   Specimen: Nasopharyngeal Swab  Result Value Ref Range Status   SARS Coronavirus 2 NEGATIVE NEGATIVE Final    Comment: (NOTE) SARS-CoV-2 target nucleic acids are NOT DETECTED. The SARS-CoV-2 RNA is generally detectable in upper and lower respiratory specimens during the acute phase  of infection. Negative results do not preclude SARS-CoV-2 infection, do not rule out co-infections with other pathogens, and should not be used as the sole basis for treatment or other patient management decisions. Negative results must be combined with clinical observations, patient history, and epidemiological information. The expected result is Negative. Fact Sheet for Patients: SugarRoll.be Fact Sheet for Healthcare Providers: https://www.woods-mathews.com/ This test is not yet approved or cleared by the [place] FDA and  has been authorized for detection and/or diagnosis of SARS-CoV-2 by FDA under an Emergency Use Authorization (EUA). This EUA will remain  in effect (meaning this test can be used) for the duration of the COVID-19 declaration under Section 56 4(b)(1) of the Act, 21 U.S.C. section 360bbb-3(b)(1), unless the authorization is terminated or revoked sooner. Performed at [facility], [place] [address], [place], [place] [address]   C difficile quick  scan w PCR reflex     Status: None   Collection Time: 04/08/19  5:38 PM   Specimen: STOOL  Result Value Ref Range Status   C Diff antigen NEGATIVE NEGATIVE Final   C Diff toxin NEGATIVE NEGATIVE Final   C Diff interpretation No C. difficile detected.  Final    Comment: Performed at [facility], [place] [address], [place], [place] [address]     Studies: No results found.  Scheduled Meds: . Chlorhexidine Gluconate Cloth  6 each Topical Daily  . cholecalciferol  2,000 Units Oral Daily  . enoxaparin (LOVENOX) injection  40 mg Subcutaneous QHS  . fluticasone  2 spray Each Nare Daily  . insulin aspart  0-9 Units Subcutaneous TID WC  . insulin glargine  18 Units Subcutaneous QHS  . insulin glargine  24 Units Subcutaneous Daily  . Melatonin  10 mg Oral QHS  . pantoprazole  40 mg Oral Daily  . pneumococcal 23 valent vaccine  0.5 mL Intramuscular Tomorrow-1000  . sertraline  150 mg Oral QHS  . tamsulosin  0.8 mg Oral QPM  . traZODone  50 mg Oral QHS   Continuous Infusions:   Active Problems:   Uncontrolled type 2 diabetes mellitus with hyperglycemia, with long-term current use of insulin (HCC)   Abdominal pain   Dementia without behavioral disturbance (HCC)   Recurrent Clostridium difficile diarrhea   Ileus (HCC)   Hypokalemia   Diarrhea, unspecified   GERD (gastroesophageal reflux disease)   BPH (benign prostatic hyperplasia)   Abnormality of eyelid   Acute conjunctivitis, right eye      Desiree Hane  [facility]

## 2019-04-13 NOTE — NC FL2 (Signed)
[place] MEDICAID FL2 LEVEL OF CARE SCREENING TOOL     IDENTIFICATION  Patient Name: Alan [name]. Birthdate: 1946/05/05 Sex: male Admission Date (Current Location): 04/08/2019  [facility] and [place] Number:  Producer, television/film/video and Address:         Provider Number: [id number]  Attending Physician Name and Address:  Laverna Peace, MD  Relative Name and Phone Number:       Current Level of Care: Hospital Recommended Level of Care: Skilled Nursing Facility Prior Approval Number:    Date Approved/Denied:   PASRR Number: [id number]  Discharge Plan: SNF    Current Diagnoses: Patient Active Problem List   Diagnosis Date Noted  . Redness of both eyes 04/13/2019  . Abnormality of eyelid 04/12/2019  . Ileus (HCC) 04/09/2019  . Hypokalemia 04/09/2019  . Diarrhea, unspecified 04/09/2019  . GERD (gastroesophageal reflux disease) 04/09/2019  . BPH (benign prostatic hyperplasia) 04/09/2019  . Abdominal pain 04/08/2019  . Dementia without behavioral disturbance (HCC) 04/08/2019  . 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 C. difficile colitis 05/07/2018  . Neurocognitive deficits 02/26/2018  . Dysphagia 02/26/2018  . Klebsiella sepsis (HCC) 12/28/2017  . Uncontrolled type 2 diabetes mellitus with hyperglycemia, with long-term current use of insulin (HCC) 12/27/2017  . DKA, type 2 (HCC) 12/26/2017  . Sepsis due to undetermined organism (HCC) 12/26/2017  . 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 Urinary tract infection associated with nephrostomy catheter (HCC) 07/07/2017  . Acute urinary retention 07/07/2017  . Acute metabolic encephalopathy 07/07/2017  . Ureteral calculus, left   . Urinary tract infection due to Klebsiella species   . Sepsis (HCC) 07/02/2017  . Candida tropicalis infection 06/20/2017  . Bilateral nephrolithiasis 06/20/2017  . History of nephrostomy   . Candidemia (HCC) 06/15/2017  . Obstruction of kidney    . 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    Orientation RESPIRATION BLADDER Height & Weight     Self, Time, Situation, Place  Normal Continent Weight: 75.2 kg Height:  6\' 1"  (185.4 cm)  BEHAVIORAL SYMPTOMS/MOOD NEUROLOGICAL BOWEL NUTRITION STATUS      Continent Diet(regular)  AMBULATORY STATUS COMMUNICATION OF NEEDS Skin   Extensive Assist Verbally Normal                       Personal Care Assistance Level of Assistance  Bathing, Feeding, Dressing Bathing Assistance: Limited assistance Feeding assistance: Limited assistance Dressing Assistance: Limited assistance     Functional Limitations Info             SPECIAL CARE FACTORS FREQUENCY                       Contractures Contractures Info: Not present    Additional Factors Info  Code Status Code Status Info: dnr             Current Medications (04/13/2019):  This is the current hospital active medication list Current Facility-Administered Medications  Medication Dose Route Frequency Provider Last Rate Last Dose  . acetaminophen (TYLENOL) tablet 650 mg  650 mg Oral Q6H PRN 04/15/2019, MD   650 mg at 04/12/19 1431   Or  . acetaminophen (TYLENOL) suppository 650 mg  650 mg Rectal Q6H PRN 04/14/19, MD      . Chlorhexidine Gluconate Cloth 2 % PADS 6 each  6 each Topical Daily Narda Bonds,  MD   6 each at 04/11/19 1012  . cholecalciferol (VITAMIN D) tablet 2,000 Units  2,000 Units Oral Daily Mariel Aloe, MD   2,000 Units at 04/13/19 1136  . cyclobenzaprine (FLEXERIL) tablet 5 mg  5 mg Oral TID PRN Oretha Milch D, MD      . enoxaparin (LOVENOX) injection 40 mg  40 mg Subcutaneous QHS Mariel Aloe, MD   40 mg at 04/12/19 2232  . fluticasone (FLONASE) 50 MCG/ACT nasal spray 2 spray  2 spray Each Nare Daily Mariel Aloe, MD   2 spray at 04/12/19 1059  . insulin aspart (novoLOG) injection 0-9 Units  0-9  Units Subcutaneous TID WC Mariel Aloe, MD   2 Units at 04/13/19 1151  . insulin glargine (LANTUS) injection 18 Units  18 Units Subcutaneous QHS Mariel Aloe, MD   18 Units at 04/12/19 2232  . insulin glargine (LANTUS) injection 24 Units  24 Units Subcutaneous Daily Mariel Aloe, MD   24 Units at 04/13/19 1141  . Melatonin TABS 10 mg  10 mg Oral QHS Mariel Aloe, MD   10 mg at 04/12/19 2232  . pantoprazole (PROTONIX) EC tablet 40 mg  40 mg Oral Daily Mariel Aloe, MD   40 mg at 04/13/19 1137  . pneumococcal 23 valent vaccine (PNU-IMMUNE) injection 0.5 mL  0.5 mL Intramuscular Tomorrow-1000 Mariel Aloe, MD      . polyvinyl alcohol (LIQUIFILM TEARS) 1.4 % ophthalmic solution 1 drop  1 drop Both Eyes QID Oretha Milch D, MD   1 drop at 04/13/19 1146  . Resource ThickenUp Clear   Oral PRN Oretha Milch D, MD      . sertraline (ZOLOFT) tablet 150 mg  150 mg Oral QHS Mariel Aloe, MD   150 mg at 04/12/19 2232  . tamsulosin (FLOMAX) capsule 0.8 mg  0.8 mg Oral QPM Mariel Aloe, MD   0.8 mg at 04/12/19 1826  . traZODone (DESYREL) tablet 50 mg  50 mg Oral QHS Mariel Aloe, MD   50 mg at 04/12/19 2232     Discharge Medications: Please see discharge summary for a list of discharge medications.  Relevant Imaging Results:  Relevant Lab Results:   Additional Information [id number]  Leeroy Cha, RN

## 2019-04-13 NOTE — TOC Transition Note (Signed)
Transition of Care [facility]) - CM/SW Discharge Note   Patient Details  Name: Alan [name]. MRN: [id number] Date of Birth: [birth year]  Transition of Care [facility]) CM/SW Contact:  Leeroy Cha, RN Phone Number: 04/13/2019, 12:46 PM   Clinical Narrative:    [facility] informed of return to facility per Shirlee Limerick will not need new covid-floor rn notified- please send around 2 pm. [facility] will pick up around 2 pm. Packet for transport given to [employer]   Final next level of care: Skilled Nursing Facility Barriers to Discharge: Continued Medical Work up   Patient Goals and CMS Choice Patient states their goals for this hospitalization and ongoing recovery are:: to return to his place of residence, LTC at [facility] [employer].gov Compare Post Acute Care list provided to:: Patient    Discharge Placement                       Discharge Plan and Services In-house Referral: Clinical Social Work Discharge Planning Services: CM Consult Post Acute Care Choice: [place]                               Social Determinants of Health (SDOH) Interventions     Readmission Risk Interventions No flowsheet data found.

## 2019-04-13 NOTE — Plan of Care (Signed)
All goals met for d/c 

## 2019-04-15 DIAGNOSIS — E119 Type 2 diabetes mellitus without complications: Secondary | ICD-10-CM | POA: Diagnosis not present

## 2019-04-15 DIAGNOSIS — I10 Essential (primary) hypertension: Secondary | ICD-10-CM | POA: Diagnosis not present

## 2019-04-15 DIAGNOSIS — R1030 Lower abdominal pain, unspecified: Secondary | ICD-10-CM | POA: Diagnosis not present

## 2019-04-15 DIAGNOSIS — R531 Weakness: Secondary | ICD-10-CM | POA: Diagnosis not present

## 2019-04-15 DIAGNOSIS — R197 Diarrhea, unspecified: Secondary | ICD-10-CM | POA: Diagnosis not present

## 2019-04-15 DIAGNOSIS — Z8673 Personal history of transient ischemic attack (TIA), and cerebral infarction without residual deficits: Secondary | ICD-10-CM | POA: Diagnosis not present

## 2019-04-15 DIAGNOSIS — R140 Abdominal distension (gaseous): Secondary | ICD-10-CM | POA: Diagnosis not present

## 2019-04-15 DIAGNOSIS — I700 Atherosclerosis of aorta: Secondary | ICD-10-CM | POA: Diagnosis not present

## 2019-04-15 DIAGNOSIS — E785 Hyperlipidemia, unspecified: Secondary | ICD-10-CM | POA: Diagnosis not present

## 2019-04-15 DIAGNOSIS — K6289 Other specified diseases of anus and rectum: Secondary | ICD-10-CM | POA: Diagnosis not present

## 2019-04-15 DIAGNOSIS — Z880 Allergy status to penicillin: Secondary | ICD-10-CM | POA: Diagnosis not present

## 2019-04-20 DIAGNOSIS — E785 Hyperlipidemia, unspecified: Secondary | ICD-10-CM | POA: Diagnosis not present

## 2019-04-20 DIAGNOSIS — E1142 Type 2 diabetes mellitus with diabetic polyneuropathy: Secondary | ICD-10-CM | POA: Diagnosis not present

## 2019-04-28 DIAGNOSIS — A0472 Enterocolitis due to Clostridium difficile, not specified as recurrent: Secondary | ICD-10-CM | POA: Diagnosis not present

## 2019-05-06 DIAGNOSIS — G4700 Insomnia, unspecified: Secondary | ICD-10-CM | POA: Diagnosis not present

## 2019-05-06 DIAGNOSIS — K2970 Gastritis, unspecified, without bleeding: Secondary | ICD-10-CM | POA: Diagnosis not present

## 2019-05-06 DIAGNOSIS — K567 Ileus, unspecified: Secondary | ICD-10-CM | POA: Diagnosis not present

## 2019-05-06 DIAGNOSIS — E1142 Type 2 diabetes mellitus with diabetic polyneuropathy: Secondary | ICD-10-CM | POA: Diagnosis not present

## 2019-05-12 DIAGNOSIS — E1142 Type 2 diabetes mellitus with diabetic polyneuropathy: Secondary | ICD-10-CM | POA: Diagnosis not present

## 2019-05-12 DIAGNOSIS — A498 Other bacterial infections of unspecified site: Secondary | ICD-10-CM | POA: Diagnosis not present

## 2019-05-12 DIAGNOSIS — L853 Xerosis cutis: Secondary | ICD-10-CM | POA: Diagnosis not present

## 2019-05-12 DIAGNOSIS — G4700 Insomnia, unspecified: Secondary | ICD-10-CM | POA: Diagnosis not present

## 2019-05-19 DIAGNOSIS — E785 Hyperlipidemia, unspecified: Secondary | ICD-10-CM | POA: Diagnosis not present

## 2019-05-19 DIAGNOSIS — E1142 Type 2 diabetes mellitus with diabetic polyneuropathy: Secondary | ICD-10-CM | POA: Diagnosis not present

## 2019-06-03 DIAGNOSIS — N400 Enlarged prostate without lower urinary tract symptoms: Secondary | ICD-10-CM | POA: Diagnosis not present

## 2019-06-03 DIAGNOSIS — E1142 Type 2 diabetes mellitus with diabetic polyneuropathy: Secondary | ICD-10-CM | POA: Diagnosis not present

## 2019-06-03 DIAGNOSIS — K529 Noninfective gastroenteritis and colitis, unspecified: Secondary | ICD-10-CM | POA: Diagnosis not present

## 2019-06-03 DIAGNOSIS — E876 Hypokalemia: Secondary | ICD-10-CM | POA: Diagnosis not present

## 2019-06-03 DIAGNOSIS — E870 Hyperosmolality and hypernatremia: Secondary | ICD-10-CM | POA: Diagnosis not present

## 2019-06-28 DIAGNOSIS — U071 COVID-19: Secondary | ICD-10-CM | POA: Diagnosis not present

## 2019-07-02 DIAGNOSIS — U071 COVID-19: Secondary | ICD-10-CM | POA: Diagnosis not present

## 2019-07-06 DIAGNOSIS — U071 COVID-19: Secondary | ICD-10-CM | POA: Diagnosis not present

## 2019-07-12 DIAGNOSIS — U071 COVID-19: Secondary | ICD-10-CM | POA: Diagnosis not present

## 2019-07-12 DIAGNOSIS — A498 Other bacterial infections of unspecified site: Secondary | ICD-10-CM | POA: Diagnosis not present

## 2019-07-12 DIAGNOSIS — N400 Enlarged prostate without lower urinary tract symptoms: Secondary | ICD-10-CM | POA: Diagnosis not present

## 2019-07-12 DIAGNOSIS — G4700 Insomnia, unspecified: Secondary | ICD-10-CM | POA: Diagnosis not present

## 2019-07-12 DIAGNOSIS — E1142 Type 2 diabetes mellitus with diabetic polyneuropathy: Secondary | ICD-10-CM | POA: Diagnosis not present

## 2019-07-14 DIAGNOSIS — K529 Noninfective gastroenteritis and colitis, unspecified: Secondary | ICD-10-CM | POA: Diagnosis not present

## 2019-07-14 DIAGNOSIS — E1142 Type 2 diabetes mellitus with diabetic polyneuropathy: Secondary | ICD-10-CM | POA: Diagnosis not present

## 2019-07-14 DIAGNOSIS — E785 Hyperlipidemia, unspecified: Secondary | ICD-10-CM | POA: Diagnosis not present

## 2019-07-14 DIAGNOSIS — U071 COVID-19: Secondary | ICD-10-CM | POA: Diagnosis not present

## 2019-07-20 DIAGNOSIS — U071 COVID-19: Secondary | ICD-10-CM | POA: Diagnosis not present

## 2019-07-20 DIAGNOSIS — R1312 Dysphagia, oropharyngeal phase: Secondary | ICD-10-CM | POA: Diagnosis not present

## 2019-07-21 DIAGNOSIS — R1312 Dysphagia, oropharyngeal phase: Secondary | ICD-10-CM | POA: Diagnosis not present

## 2019-07-22 DIAGNOSIS — R1312 Dysphagia, oropharyngeal phase: Secondary | ICD-10-CM | POA: Diagnosis not present

## 2019-07-23 DIAGNOSIS — R1312 Dysphagia, oropharyngeal phase: Secondary | ICD-10-CM | POA: Diagnosis not present

## 2019-07-27 DIAGNOSIS — U071 COVID-19: Secondary | ICD-10-CM | POA: Diagnosis not present

## 2019-07-29 DIAGNOSIS — E1142 Type 2 diabetes mellitus with diabetic polyneuropathy: Secondary | ICD-10-CM | POA: Diagnosis not present

## 2019-07-29 DIAGNOSIS — A498 Other bacterial infections of unspecified site: Secondary | ICD-10-CM | POA: Diagnosis not present

## 2019-07-29 DIAGNOSIS — G4700 Insomnia, unspecified: Secondary | ICD-10-CM | POA: Diagnosis not present

## 2019-07-29 DIAGNOSIS — Z23 Encounter for immunization: Secondary | ICD-10-CM | POA: Diagnosis not present

## 2019-07-29 DIAGNOSIS — H109 Unspecified conjunctivitis: Secondary | ICD-10-CM | POA: Diagnosis not present

## 2019-08-04 DIAGNOSIS — E1142 Type 2 diabetes mellitus with diabetic polyneuropathy: Secondary | ICD-10-CM | POA: Diagnosis not present

## 2019-08-04 DIAGNOSIS — E785 Hyperlipidemia, unspecified: Secondary | ICD-10-CM | POA: Diagnosis not present

## 2019-08-09 DIAGNOSIS — F329 Major depressive disorder, single episode, unspecified: Secondary | ICD-10-CM | POA: Diagnosis not present

## 2019-08-09 DIAGNOSIS — F0391 Unspecified dementia with behavioral disturbance: Secondary | ICD-10-CM | POA: Diagnosis not present

## 2019-08-09 DIAGNOSIS — E559 Vitamin D deficiency, unspecified: Secondary | ICD-10-CM | POA: Diagnosis not present

## 2019-08-09 DIAGNOSIS — G4700 Insomnia, unspecified: Secondary | ICD-10-CM | POA: Diagnosis not present

## 2019-08-11 DIAGNOSIS — G4700 Insomnia, unspecified: Secondary | ICD-10-CM | POA: Diagnosis not present

## 2019-08-11 DIAGNOSIS — E559 Vitamin D deficiency, unspecified: Secondary | ICD-10-CM | POA: Diagnosis not present

## 2019-08-11 DIAGNOSIS — E1142 Type 2 diabetes mellitus with diabetic polyneuropathy: Secondary | ICD-10-CM | POA: Diagnosis not present

## 2019-08-11 DIAGNOSIS — K219 Gastro-esophageal reflux disease without esophagitis: Secondary | ICD-10-CM | POA: Diagnosis not present

## 2019-08-23 DIAGNOSIS — R1312 Dysphagia, oropharyngeal phase: Secondary | ICD-10-CM | POA: Diagnosis not present

## 2019-08-24 DIAGNOSIS — R1312 Dysphagia, oropharyngeal phase: Secondary | ICD-10-CM | POA: Diagnosis not present

## 2019-08-25 DIAGNOSIS — R1312 Dysphagia, oropharyngeal phase: Secondary | ICD-10-CM | POA: Diagnosis not present

## 2019-08-26 DIAGNOSIS — R1312 Dysphagia, oropharyngeal phase: Secondary | ICD-10-CM | POA: Diagnosis not present

## 2019-08-26 DIAGNOSIS — N400 Enlarged prostate without lower urinary tract symptoms: Secondary | ICD-10-CM | POA: Diagnosis not present

## 2019-08-26 DIAGNOSIS — A498 Other bacterial infections of unspecified site: Secondary | ICD-10-CM | POA: Diagnosis not present

## 2019-08-26 DIAGNOSIS — G4700 Insomnia, unspecified: Secondary | ICD-10-CM | POA: Diagnosis not present

## 2019-08-26 DIAGNOSIS — E1142 Type 2 diabetes mellitus with diabetic polyneuropathy: Secondary | ICD-10-CM | POA: Diagnosis not present

## 2019-08-27 DIAGNOSIS — R1312 Dysphagia, oropharyngeal phase: Secondary | ICD-10-CM | POA: Diagnosis not present

## 2019-08-30 DIAGNOSIS — R1312 Dysphagia, oropharyngeal phase: Secondary | ICD-10-CM | POA: Diagnosis not present

## 2019-08-31 DIAGNOSIS — R1312 Dysphagia, oropharyngeal phase: Secondary | ICD-10-CM | POA: Diagnosis not present

## 2019-09-01 DIAGNOSIS — R1312 Dysphagia, oropharyngeal phase: Secondary | ICD-10-CM | POA: Diagnosis not present

## 2019-09-02 DIAGNOSIS — R1312 Dysphagia, oropharyngeal phase: Secondary | ICD-10-CM | POA: Diagnosis not present

## 2019-09-03 DIAGNOSIS — R1312 Dysphagia, oropharyngeal phase: Secondary | ICD-10-CM | POA: Diagnosis not present

## 2019-09-06 DIAGNOSIS — R1312 Dysphagia, oropharyngeal phase: Secondary | ICD-10-CM | POA: Diagnosis not present

## 2019-09-06 DIAGNOSIS — E1142 Type 2 diabetes mellitus with diabetic polyneuropathy: Secondary | ICD-10-CM | POA: Diagnosis not present

## 2019-09-06 DIAGNOSIS — E785 Hyperlipidemia, unspecified: Secondary | ICD-10-CM | POA: Diagnosis not present

## 2019-09-06 DIAGNOSIS — E1140 Type 2 diabetes mellitus with diabetic neuropathy, unspecified: Secondary | ICD-10-CM | POA: Diagnosis not present

## 2019-09-06 DIAGNOSIS — F0391 Unspecified dementia with behavioral disturbance: Secondary | ICD-10-CM | POA: Diagnosis not present

## 2019-09-07 DIAGNOSIS — R1312 Dysphagia, oropharyngeal phase: Secondary | ICD-10-CM | POA: Diagnosis not present

## 2019-09-08 DIAGNOSIS — G4700 Insomnia, unspecified: Secondary | ICD-10-CM | POA: Diagnosis not present

## 2019-09-08 DIAGNOSIS — K219 Gastro-esophageal reflux disease without esophagitis: Secondary | ICD-10-CM | POA: Diagnosis not present

## 2019-09-08 DIAGNOSIS — F0391 Unspecified dementia with behavioral disturbance: Secondary | ICD-10-CM | POA: Diagnosis not present

## 2019-09-08 DIAGNOSIS — R1312 Dysphagia, oropharyngeal phase: Secondary | ICD-10-CM | POA: Diagnosis not present

## 2019-09-08 DIAGNOSIS — E1140 Type 2 diabetes mellitus with diabetic neuropathy, unspecified: Secondary | ICD-10-CM | POA: Diagnosis not present

## 2019-09-09 DIAGNOSIS — R1312 Dysphagia, oropharyngeal phase: Secondary | ICD-10-CM | POA: Diagnosis not present

## 2019-09-10 DIAGNOSIS — R1312 Dysphagia, oropharyngeal phase: Secondary | ICD-10-CM | POA: Diagnosis not present

## 2019-09-13 DIAGNOSIS — R1312 Dysphagia, oropharyngeal phase: Secondary | ICD-10-CM | POA: Diagnosis not present

## 2019-09-14 DIAGNOSIS — R1312 Dysphagia, oropharyngeal phase: Secondary | ICD-10-CM | POA: Diagnosis not present

## 2019-09-15 DIAGNOSIS — R1312 Dysphagia, oropharyngeal phase: Secondary | ICD-10-CM | POA: Diagnosis not present

## 2019-09-16 DIAGNOSIS — R1312 Dysphagia, oropharyngeal phase: Secondary | ICD-10-CM | POA: Diagnosis not present

## 2019-09-16 DIAGNOSIS — E1165 Type 2 diabetes mellitus with hyperglycemia: Secondary | ICD-10-CM | POA: Diagnosis not present

## 2019-09-16 DIAGNOSIS — D649 Anemia, unspecified: Secondary | ICD-10-CM | POA: Diagnosis not present

## 2019-09-17 DIAGNOSIS — Z794 Long term (current) use of insulin: Secondary | ICD-10-CM | POA: Diagnosis not present

## 2019-09-17 DIAGNOSIS — Z79899 Other long term (current) drug therapy: Secondary | ICD-10-CM | POA: Diagnosis not present

## 2019-09-17 DIAGNOSIS — R1312 Dysphagia, oropharyngeal phase: Secondary | ICD-10-CM | POA: Diagnosis not present

## 2019-09-20 DIAGNOSIS — R1312 Dysphagia, oropharyngeal phase: Secondary | ICD-10-CM | POA: Diagnosis not present

## 2019-09-20 DIAGNOSIS — F419 Anxiety disorder, unspecified: Secondary | ICD-10-CM | POA: Diagnosis not present

## 2019-09-20 DIAGNOSIS — G4700 Insomnia, unspecified: Secondary | ICD-10-CM | POA: Diagnosis not present

## 2019-09-20 DIAGNOSIS — F0391 Unspecified dementia with behavioral disturbance: Secondary | ICD-10-CM | POA: Diagnosis not present

## 2019-09-21 DIAGNOSIS — R1312 Dysphagia, oropharyngeal phase: Secondary | ICD-10-CM | POA: Diagnosis not present

## 2019-09-22 DIAGNOSIS — R1312 Dysphagia, oropharyngeal phase: Secondary | ICD-10-CM | POA: Diagnosis not present

## 2019-09-22 DIAGNOSIS — N390 Urinary tract infection, site not specified: Secondary | ICD-10-CM | POA: Diagnosis not present

## 2019-09-22 DIAGNOSIS — R319 Hematuria, unspecified: Secondary | ICD-10-CM | POA: Diagnosis not present

## 2019-09-23 DIAGNOSIS — R197 Diarrhea, unspecified: Secondary | ICD-10-CM | POA: Diagnosis not present

## 2019-09-23 DIAGNOSIS — E1165 Type 2 diabetes mellitus with hyperglycemia: Secondary | ICD-10-CM | POA: Diagnosis not present

## 2019-09-23 DIAGNOSIS — N390 Urinary tract infection, site not specified: Secondary | ICD-10-CM | POA: Diagnosis not present

## 2019-09-23 DIAGNOSIS — E876 Hypokalemia: Secondary | ICD-10-CM | POA: Diagnosis not present

## 2019-09-23 DIAGNOSIS — N400 Enlarged prostate without lower urinary tract symptoms: Secondary | ICD-10-CM | POA: Diagnosis not present

## 2019-09-23 DIAGNOSIS — F0391 Unspecified dementia with behavioral disturbance: Secondary | ICD-10-CM | POA: Diagnosis not present

## 2019-09-23 DIAGNOSIS — Z7982 Long term (current) use of aspirin: Secondary | ICD-10-CM | POA: Diagnosis not present

## 2019-09-23 DIAGNOSIS — G8929 Other chronic pain: Secondary | ICD-10-CM | POA: Diagnosis not present

## 2019-09-23 DIAGNOSIS — Z89512 Acquired absence of left leg below knee: Secondary | ICD-10-CM | POA: Diagnosis not present

## 2019-09-23 DIAGNOSIS — E785 Hyperlipidemia, unspecified: Secondary | ICD-10-CM | POA: Diagnosis not present

## 2019-09-23 DIAGNOSIS — M549 Dorsalgia, unspecified: Secondary | ICD-10-CM | POA: Diagnosis not present

## 2019-09-23 DIAGNOSIS — A498 Other bacterial infections of unspecified site: Secondary | ICD-10-CM | POA: Diagnosis not present

## 2019-09-23 DIAGNOSIS — E1140 Type 2 diabetes mellitus with diabetic neuropathy, unspecified: Secondary | ICD-10-CM | POA: Diagnosis not present

## 2019-09-23 DIAGNOSIS — R1312 Dysphagia, oropharyngeal phase: Secondary | ICD-10-CM | POA: Diagnosis not present

## 2019-09-23 DIAGNOSIS — A0472 Enterocolitis due to Clostridium difficile, not specified as recurrent: Secondary | ICD-10-CM | POA: Diagnosis not present

## 2019-09-23 DIAGNOSIS — F0390 Unspecified dementia without behavioral disturbance: Secondary | ICD-10-CM | POA: Diagnosis not present

## 2019-09-23 DIAGNOSIS — H109 Unspecified conjunctivitis: Secondary | ICD-10-CM | POA: Diagnosis not present

## 2019-09-23 DIAGNOSIS — Z794 Long term (current) use of insulin: Secondary | ICD-10-CM | POA: Diagnosis not present

## 2019-09-23 DIAGNOSIS — R278 Other lack of coordination: Secondary | ICD-10-CM | POA: Diagnosis not present

## 2019-09-23 DIAGNOSIS — D649 Anemia, unspecified: Secondary | ICD-10-CM | POA: Diagnosis not present

## 2019-09-23 DIAGNOSIS — E11618 Type 2 diabetes mellitus with other diabetic arthropathy: Secondary | ICD-10-CM | POA: Diagnosis not present

## 2019-09-23 DIAGNOSIS — M25532 Pain in left wrist: Secondary | ICD-10-CM | POA: Diagnosis not present

## 2019-09-23 DIAGNOSIS — G4700 Insomnia, unspecified: Secondary | ICD-10-CM | POA: Diagnosis not present

## 2019-09-23 DIAGNOSIS — A0471 Enterocolitis due to Clostridium difficile, recurrent: Secondary | ICD-10-CM | POA: Diagnosis not present

## 2019-09-23 DIAGNOSIS — M25521 Pain in right elbow: Secondary | ICD-10-CM | POA: Diagnosis not present

## 2019-09-23 DIAGNOSIS — E1142 Type 2 diabetes mellitus with diabetic polyneuropathy: Secondary | ICD-10-CM | POA: Diagnosis not present

## 2019-09-23 DIAGNOSIS — M6281 Muscle weakness (generalized): Secondary | ICD-10-CM | POA: Diagnosis not present

## 2019-09-23 DIAGNOSIS — I2510 Atherosclerotic heart disease of native coronary artery without angina pectoris: Secondary | ICD-10-CM | POA: Diagnosis not present

## 2019-09-23 DIAGNOSIS — K219 Gastro-esophageal reflux disease without esophagitis: Secondary | ICD-10-CM | POA: Diagnosis not present

## 2019-09-23 DIAGNOSIS — F329 Major depressive disorder, single episode, unspecified: Secondary | ICD-10-CM | POA: Diagnosis not present

## 2019-09-23 DIAGNOSIS — F419 Anxiety disorder, unspecified: Secondary | ICD-10-CM | POA: Diagnosis not present

## 2019-09-23 DIAGNOSIS — E639 Nutritional deficiency, unspecified: Secondary | ICD-10-CM | POA: Diagnosis not present

## 2019-09-23 DIAGNOSIS — I739 Peripheral vascular disease, unspecified: Secondary | ICD-10-CM | POA: Diagnosis not present

## 2019-09-23 DIAGNOSIS — Z89421 Acquired absence of other right toe(s): Secondary | ICD-10-CM | POA: Diagnosis not present

## 2019-10-06 DIAGNOSIS — G4700 Insomnia, unspecified: Secondary | ICD-10-CM | POA: Diagnosis not present

## 2019-10-06 DIAGNOSIS — E1142 Type 2 diabetes mellitus with diabetic polyneuropathy: Secondary | ICD-10-CM | POA: Diagnosis not present

## 2019-10-06 DIAGNOSIS — N400 Enlarged prostate without lower urinary tract symptoms: Secondary | ICD-10-CM | POA: Diagnosis not present

## 2019-10-06 DIAGNOSIS — M25532 Pain in left wrist: Secondary | ICD-10-CM | POA: Diagnosis not present

## 2019-10-15 DIAGNOSIS — F0391 Unspecified dementia with behavioral disturbance: Secondary | ICD-10-CM | POA: Diagnosis not present

## 2019-10-15 DIAGNOSIS — E785 Hyperlipidemia, unspecified: Secondary | ICD-10-CM | POA: Diagnosis not present

## 2019-10-15 DIAGNOSIS — E1142 Type 2 diabetes mellitus with diabetic polyneuropathy: Secondary | ICD-10-CM | POA: Diagnosis not present

## 2019-10-15 DIAGNOSIS — E1140 Type 2 diabetes mellitus with diabetic neuropathy, unspecified: Secondary | ICD-10-CM | POA: Diagnosis not present

## 2019-10-18 DIAGNOSIS — R197 Diarrhea, unspecified: Secondary | ICD-10-CM | POA: Diagnosis not present

## 2019-10-18 DIAGNOSIS — A498 Other bacterial infections of unspecified site: Secondary | ICD-10-CM | POA: Diagnosis not present

## 2019-10-18 DIAGNOSIS — E1142 Type 2 diabetes mellitus with diabetic polyneuropathy: Secondary | ICD-10-CM | POA: Diagnosis not present

## 2019-10-18 DIAGNOSIS — M25521 Pain in right elbow: Secondary | ICD-10-CM | POA: Diagnosis not present

## 2019-10-28 DIAGNOSIS — A498 Other bacterial infections of unspecified site: Secondary | ICD-10-CM | POA: Diagnosis not present

## 2019-10-28 DIAGNOSIS — M25532 Pain in left wrist: Secondary | ICD-10-CM | POA: Diagnosis not present

## 2019-10-28 DIAGNOSIS — R197 Diarrhea, unspecified: Secondary | ICD-10-CM | POA: Diagnosis not present

## 2019-10-28 DIAGNOSIS — E1142 Type 2 diabetes mellitus with diabetic polyneuropathy: Secondary | ICD-10-CM | POA: Diagnosis not present

## 2019-11-03 DIAGNOSIS — G4700 Insomnia, unspecified: Secondary | ICD-10-CM | POA: Diagnosis not present

## 2019-11-03 DIAGNOSIS — E1142 Type 2 diabetes mellitus with diabetic polyneuropathy: Secondary | ICD-10-CM | POA: Diagnosis not present

## 2019-11-03 DIAGNOSIS — H109 Unspecified conjunctivitis: Secondary | ICD-10-CM | POA: Diagnosis not present

## 2019-11-03 DIAGNOSIS — N400 Enlarged prostate without lower urinary tract symptoms: Secondary | ICD-10-CM | POA: Diagnosis not present

## 2019-11-25 DIAGNOSIS — F419 Anxiety disorder, unspecified: Secondary | ICD-10-CM | POA: Diagnosis not present

## 2019-11-25 DIAGNOSIS — F0391 Unspecified dementia with behavioral disturbance: Secondary | ICD-10-CM | POA: Diagnosis not present

## 2019-11-25 DIAGNOSIS — E1142 Type 2 diabetes mellitus with diabetic polyneuropathy: Secondary | ICD-10-CM | POA: Diagnosis not present

## 2019-11-25 DIAGNOSIS — G4700 Insomnia, unspecified: Secondary | ICD-10-CM | POA: Diagnosis not present

## 2019-11-26 DIAGNOSIS — A0472 Enterocolitis due to Clostridium difficile, not specified as recurrent: Secondary | ICD-10-CM | POA: Diagnosis not present

## 2019-11-29 DIAGNOSIS — R4182 Altered mental status, unspecified: Secondary | ICD-10-CM | POA: Diagnosis not present

## 2019-11-29 DIAGNOSIS — L258 Unspecified contact dermatitis due to other agents: Secondary | ICD-10-CM | POA: Diagnosis not present

## 2019-11-29 DIAGNOSIS — R32 Unspecified urinary incontinence: Secondary | ICD-10-CM | POA: Diagnosis not present

## 2019-11-29 DIAGNOSIS — E1142 Type 2 diabetes mellitus with diabetic polyneuropathy: Secondary | ICD-10-CM | POA: Diagnosis not present

## 2019-11-30 DIAGNOSIS — Z79899 Other long term (current) drug therapy: Secondary | ICD-10-CM | POA: Diagnosis not present

## 2019-11-30 DIAGNOSIS — A0471 Enterocolitis due to Clostridium difficile, recurrent: Secondary | ICD-10-CM | POA: Diagnosis not present

## 2019-11-30 DIAGNOSIS — D649 Anemia, unspecified: Secondary | ICD-10-CM | POA: Diagnosis not present

## 2019-12-02 DIAGNOSIS — D649 Anemia, unspecified: Secondary | ICD-10-CM | POA: Diagnosis not present

## 2019-12-02 DIAGNOSIS — I2510 Atherosclerotic heart disease of native coronary artery without angina pectoris: Secondary | ICD-10-CM | POA: Diagnosis not present

## 2019-12-02 DIAGNOSIS — E1165 Type 2 diabetes mellitus with hyperglycemia: Secondary | ICD-10-CM | POA: Diagnosis not present

## 2019-12-03 DIAGNOSIS — Z794 Long term (current) use of insulin: Secondary | ICD-10-CM | POA: Diagnosis not present

## 2019-12-03 DIAGNOSIS — A0471 Enterocolitis due to Clostridium difficile, recurrent: Secondary | ICD-10-CM | POA: Diagnosis not present

## 2019-12-04 DIAGNOSIS — R100 Acute abdomen: Secondary | ICD-10-CM | POA: Diagnosis not present

## 2019-12-04 DIAGNOSIS — A0471 Enterocolitis due to Clostridium difficile, recurrent: Secondary | ICD-10-CM | POA: Diagnosis not present

## 2019-12-04 DIAGNOSIS — E1142 Type 2 diabetes mellitus with diabetic polyneuropathy: Secondary | ICD-10-CM | POA: Diagnosis not present

## 2019-12-04 DIAGNOSIS — A0472 Enterocolitis due to Clostridium difficile, not specified as recurrent: Secondary | ICD-10-CM | POA: Diagnosis not present

## 2019-12-04 DIAGNOSIS — F329 Major depressive disorder, single episode, unspecified: Secondary | ICD-10-CM | POA: Diagnosis not present

## 2019-12-04 DIAGNOSIS — I10 Essential (primary) hypertension: Secondary | ICD-10-CM | POA: Diagnosis not present

## 2019-12-04 DIAGNOSIS — R404 Transient alteration of awareness: Secondary | ICD-10-CM | POA: Diagnosis not present

## 2019-12-04 DIAGNOSIS — J8410 Pulmonary fibrosis, unspecified: Secondary | ICD-10-CM | POA: Diagnosis not present

## 2019-12-04 DIAGNOSIS — Z8673 Personal history of transient ischemic attack (TIA), and cerebral infarction without residual deficits: Secondary | ICD-10-CM | POA: Diagnosis not present

## 2019-12-04 DIAGNOSIS — Z79899 Other long term (current) drug therapy: Secondary | ICD-10-CM | POA: Diagnosis not present

## 2019-12-04 DIAGNOSIS — E785 Hyperlipidemia, unspecified: Secondary | ICD-10-CM | POA: Diagnosis not present

## 2019-12-04 DIAGNOSIS — I700 Atherosclerosis of aorta: Secondary | ICD-10-CM | POA: Diagnosis not present

## 2019-12-04 DIAGNOSIS — Z7952 Long term (current) use of systemic steroids: Secondary | ICD-10-CM | POA: Diagnosis not present

## 2019-12-04 DIAGNOSIS — Z794 Long term (current) use of insulin: Secondary | ICD-10-CM | POA: Diagnosis not present

## 2019-12-04 DIAGNOSIS — Z880 Allergy status to penicillin: Secondary | ICD-10-CM | POA: Diagnosis not present

## 2019-12-04 DIAGNOSIS — F0390 Unspecified dementia without behavioral disturbance: Secondary | ICD-10-CM | POA: Diagnosis not present

## 2019-12-04 DIAGNOSIS — R911 Solitary pulmonary nodule: Secondary | ICD-10-CM | POA: Diagnosis not present

## 2019-12-04 DIAGNOSIS — N281 Cyst of kidney, acquired: Secondary | ICD-10-CM | POA: Diagnosis not present

## 2019-12-04 DIAGNOSIS — R197 Diarrhea, unspecified: Secondary | ICD-10-CM | POA: Diagnosis not present

## 2019-12-04 DIAGNOSIS — R140 Abdominal distension (gaseous): Secondary | ICD-10-CM | POA: Diagnosis not present

## 2019-12-07 DIAGNOSIS — M810 Age-related osteoporosis without current pathological fracture: Secondary | ICD-10-CM | POA: Diagnosis not present

## 2019-12-07 DIAGNOSIS — F329 Major depressive disorder, single episode, unspecified: Secondary | ICD-10-CM | POA: Diagnosis not present

## 2019-12-07 DIAGNOSIS — E1140 Type 2 diabetes mellitus with diabetic neuropathy, unspecified: Secondary | ICD-10-CM | POA: Diagnosis not present

## 2019-12-07 DIAGNOSIS — N400 Enlarged prostate without lower urinary tract symptoms: Secondary | ICD-10-CM | POA: Diagnosis not present

## 2019-12-07 DIAGNOSIS — E559 Vitamin D deficiency, unspecified: Secondary | ICD-10-CM | POA: Diagnosis not present

## 2019-12-07 DIAGNOSIS — F0391 Unspecified dementia with behavioral disturbance: Secondary | ICD-10-CM | POA: Diagnosis not present

## 2019-12-07 DIAGNOSIS — E1142 Type 2 diabetes mellitus with diabetic polyneuropathy: Secondary | ICD-10-CM | POA: Diagnosis not present

## 2019-12-07 DIAGNOSIS — D508 Other iron deficiency anemias: Secondary | ICD-10-CM | POA: Diagnosis not present

## 2019-12-07 DIAGNOSIS — E785 Hyperlipidemia, unspecified: Secondary | ICD-10-CM | POA: Diagnosis not present

## 2019-12-09 ENCOUNTER — Telehealth: Payer: Self-pay | Admitting: Pharmacy Technician

## 2019-12-09 ENCOUNTER — Encounter: Payer: Self-pay | Admitting: Internal Medicine

## 2019-12-09 ENCOUNTER — Ambulatory Visit (INDEPENDENT_AMBULATORY_CARE_PROVIDER_SITE_OTHER): Payer: Medicare Other | Admitting: Internal Medicine

## 2019-12-09 ENCOUNTER — Other Ambulatory Visit: Payer: Self-pay

## 2019-12-09 ENCOUNTER — Telehealth: Payer: Self-pay | Admitting: *Deleted

## 2019-12-09 VITALS — BP 101/68 | HR 77 | Temp 98.2°F | Ht 64.0 in | Wt 135.6 lb

## 2019-12-09 DIAGNOSIS — A0471 Enterocolitis due to Clostridium difficile, recurrent: Secondary | ICD-10-CM

## 2019-12-09 DIAGNOSIS — A0472 Enterocolitis due to Clostridium difficile, not specified as recurrent: Secondary | ICD-10-CM

## 2019-12-09 NOTE — Telephone Encounter (Addendum)
[facility] Patient Advocate Encounter   Received notification from [facility] that prior authorization for Zinplava is required.   PA submitted on 12/09/2019 Key [id number] Status is APPROVED.    [facility] will continue to follow and coordinate medication and infusion at short stay.   Netty Starring. Dimas Aguas CPhT Specialty Pharmacy Patient [facility] for Infectious Disease Phone: [phone] Fax:  [phone]

## 2019-12-09 NOTE — Progress Notes (Addendum)
[facility] for Infectious Disease      Reason for Consult: recurrent C diff infection    Referring Physician: Dr. Linna Darner    Patient ID: Alan Linden., male    DOB: November 29, 1945, 74 y.o.   MRN: [id number]  HPI:   Here for evaluation of long standing C diff infection. He has been followed for C diff by [facility] and reportedly has been on multiple courses of vancomycin, fidaxomicin and tapering doses.  The patient is demented and unable to give any history.  The caregiver is unfamiliar with him so unable to give any history.  He current finished a vancomycin taper and placed recently on fidaxomicin with a positive C diff test and recurrent diarrhea.  No other history available. He was recently treated with cipro for a presumed UTI.    Past Medical History:  Diagnosis Date  . Anemia   . Arthritis   . Dementia ([place])   . DM (diabetes mellitus), type 2 with peripheral vascular complications ([place])   . ETOH abuse   . Myocardial infarction ([place])   . Peripheral arterial disease ([place])   . Stroke [facility])     Prior to Admission medications   Medication Sig Start Date End Date Taking? Authorizing Provider  acetaminophen (TYLENOL) 325 MG tablet Take 2 tablets (650 mg total) by mouth every 6 (six) hours as needed for mild pain (or Fever >/= 101). 04/13/19  Yes Oretha Milch D, MD  Baclofen 5 MG TABS Take 5 mg by mouth 3 (three) times daily as needed. 04/13/19  Yes Oretha Milch D, MD  Cholecalciferol (QC VITAMIN D3) 2000 units TABS Take 2,000 Units by mouth daily.    Yes [provider]  fidaxomicin (DIFICID) 200 MG TABS tablet Take by mouth. 12/04/19  Yes [provider]  fluticasone (FLONASE) 50 MCG/ACT nasal spray Place 2 sprays into both nostrils daily.   Yes [provider]  geriatric multivitamins-minerals (ELDERTONIC/GEVRABON) LIQD Take 15 mLs by mouth 2 (two) times daily.   Yes [provider]  insulin aspart (NOVOLOG) 100 UNIT/ML injection  Inject 2-10 Units into the skin See admin instructions. If BS is 1-150 0U 151-200 2U 201-250 4U 251-300 6U 301-400 10U 400 CALL MD   Yes [provider]  insulin glargine (LANTUS) 100 UNIT/ML injection Inject 18-24 Units into the skin See admin instructions. Use 18 units at bedtime & 24 units in morning 06/11/16  Yes [provider]  KETOCONAZOLE, TOPICAL, 1 % SHAM Apply 1 application topically 2 (two) times a week. Monday and thursday   Yes [provider]  Melatonin 10 MG TABS Take 10 mg by mouth at bedtime.   Yes [provider]  pantoprazole (PROTONIX) 40 MG tablet Take 1 tablet (40 mg total) by mouth daily. 03/28/19  Yes Lajean Saver, MD  polyvinyl alcohol (LIQUIFILM TEARS) 1.4 % ophthalmic solution Place 1 drop into both eyes 4 (four) times daily. 04/13/19  Yes Oretha Milch D, MD  Probiotic Product (RISA-BID PROBIOTIC PO) Take 1 capsule by mouth 2 (two) times daily.   Yes [provider]  Psyllium (METAMUCIL FIBER) 51.7 % PACK Take 1 Package by mouth 2 (two) times daily.   Yes [provider]  sertraline (ZOLOFT) 50 MG tablet Take 150 mg by mouth at bedtime.    Yes [provider]  Skin Protectants, Misc. (CALAZIME SKIN PROTECTANT EX) Apply 1 application topically 2 (two) times daily.   Yes [provider]  tamsulosin (  FLOMAX) 0.4 MG CAPS capsule Take 0.8 mg by mouth every evening.    Yes [provider]  traZODone (DESYREL) 50 MG tablet Take 50 mg by mouth at bedtime.  09/11/16  Yes [provider]    Allergies  Allergen Reactions  . Penicillins Rash    Has patient had a PCN reaction causing immediate rash, facial/tongue/throat swelling, SOB or lightheadedness with hypotension: Yes Has patient had a PCN reaction causing severe rash involving mucus membranes or skin necrosis: No Has patient had a PCN reaction that required hospitalization No Has patient had a PCN reaction occurring within the  last 10 years: No If all of the above answers are "NO", then may proceed with Cephalosporin use.     Social History   Tobacco Use  . Smoking status: Former Smoker    Types: Cigarettes  . Smokeless tobacco: Never Used  . Tobacco comment: Quit smoking cigarettes 10/ 2017  Vaping Use  . Vaping Use: Never used  Substance Use Topics  . Alcohol use: No    Comment: Alcohol abuse PMH  . Drug use: No    Family History  Problem Relation Age of Onset  . Diabetes Mother   . Heart disease Father   . Heart disease Brother    Per record  Review of Systems  Unable to be assessed due to mental status   Constitutional: in no apparent distress  Vitals:   12/09/19 0927  BP: 101/68  Pulse: 77  Temp: 98.2 F (36.8 C)   EYES: anicteric Cardiovascular: Cor RRR GI: + hyperactive bowel sounds Musculoskeletal: no pedal edema noted Skin: negatives: no rash Neuro: does not verbalize anything  Labs: Lab Results  Component Value Date   WBC 8.0 04/11/2019   HGB 11.8 (L) 04/11/2019   HCT 37.9 (L) 04/11/2019   MCV 89.4 04/11/2019   PLT 191 04/11/2019    Lab Results  Component Value Date   CREATININE 0.83 04/13/2019   BUN 12 04/13/2019   NA 139 04/13/2019   K 3.6 04/13/2019   CL 103 04/13/2019   CO2 28 04/13/2019    Lab Results  Component Value Date   ALT 20 04/09/2019   AST 21 04/09/2019   ALKPHOS 71 04/09/2019   BILITOT 0.5 04/09/2019   INR 1.0 03/28/2019     Assessment: recurrent C diff over 2 years.  He was referred for FMT, though this is not presently done here.   I believe he is a good candidate for bezlotoxumab infusion to help prevent recurrence and will start the process for approval of this.  Once/if approved, will get him set up with short stay for a one time infusion.      Plan: 1) monoclonal Ab infusion 2) avoid antibiotics without a clear indication 3) avoid fluoroquinolones 4) avoid treatment for asymptomatic bacteruria Follow up in 4 weeks

## 2019-12-10 NOTE — Telephone Encounter (Signed)
Short stay speaking with pharmacy to confirm medication availability and administration.  Will follow up Monday to confirm appointment.

## 2019-12-10 NOTE — Telephone Encounter (Signed)
Left message with Laverne requesting an appointment for zinplava infusion on 6/24. Per [facility] manner, transportation is unavailable that day between 59 and 2, but any other time would work. Orders written, ready to fax to Short Stay.  Pharmacy to courier the zinplava for infusion from [facility] long pharmacy. Andree Coss, RN

## 2019-12-13 NOTE — Telephone Encounter (Signed)
Patient scheduled for zinplava infusion 6/24 9:00.  RN notified Olegario Messier at [facility], relayed short stay's request that they send an employee to accompany patient through his infusion.  Olegario Messier will send CNA. Andree Coss, RN

## 2019-12-15 ENCOUNTER — Other Ambulatory Visit (HOSPITAL_COMMUNITY): Payer: Self-pay | Admitting: *Deleted

## 2019-12-15 ENCOUNTER — Ambulatory Visit: Payer: Medicare Other | Admitting: Internal Medicine

## 2019-12-16 ENCOUNTER — Other Ambulatory Visit: Payer: Self-pay

## 2019-12-16 ENCOUNTER — Ambulatory Visit (HOSPITAL_COMMUNITY)
Admission: RE | Admit: 2019-12-16 | Discharge: 2019-12-16 | Disposition: A | Discharge status: Home / Self Care | Payer: Medicare Other | Source: Ambulatory Visit | Attending: Internal Medicine | Admitting: Internal Medicine

## 2019-12-16 DIAGNOSIS — A0471 Enterocolitis due to Clostridium difficile, recurrent: Secondary | ICD-10-CM | POA: Diagnosis not present

## 2019-12-16 MED ORDER — SODIUM CHLORIDE 0.9 % IV SOLN
10.0000 mg/kg | Freq: Once | INTRAVENOUS | Status: AC
  Administered 2019-12-16: 615 mg via INTRAVENOUS
  Filled 2019-12-16: qty 24.6

## 2020-01-04 DIAGNOSIS — A0471 Enterocolitis due to Clostridium difficile, recurrent: Secondary | ICD-10-CM | POA: Diagnosis not present

## 2020-01-04 DIAGNOSIS — A0472 Enterocolitis due to Clostridium difficile, not specified as recurrent: Secondary | ICD-10-CM | POA: Diagnosis not present

## 2020-01-05 ENCOUNTER — Emergency Department (HOSPITAL_COMMUNITY): Payer: Medicare Other

## 2020-01-05 ENCOUNTER — Encounter (HOSPITAL_COMMUNITY): Payer: Self-pay | Admitting: Emergency Medicine

## 2020-01-05 ENCOUNTER — Other Ambulatory Visit: Payer: Self-pay

## 2020-01-05 ENCOUNTER — Emergency Department (HOSPITAL_COMMUNITY)
Admission: EM | Admit: 2020-01-05 | Discharge: 2020-01-06 | Disposition: A | Discharge status: Home / Self Care | Payer: Medicare Other | Attending: Emergency Medicine | Admitting: Emergency Medicine

## 2020-01-05 DIAGNOSIS — R32 Unspecified urinary incontinence: Secondary | ICD-10-CM | POA: Diagnosis not present

## 2020-01-05 DIAGNOSIS — I700 Atherosclerosis of aorta: Secondary | ICD-10-CM | POA: Diagnosis not present

## 2020-01-05 DIAGNOSIS — R140 Abdominal distension (gaseous): Secondary | ICD-10-CM | POA: Insufficient documentation

## 2020-01-05 DIAGNOSIS — I252 Old myocardial infarction: Secondary | ICD-10-CM | POA: Insufficient documentation

## 2020-01-05 DIAGNOSIS — Z955 Presence of coronary angioplasty implant and graft: Secondary | ICD-10-CM | POA: Insufficient documentation

## 2020-01-05 DIAGNOSIS — R109 Unspecified abdominal pain: Secondary | ICD-10-CM | POA: Diagnosis not present

## 2020-01-05 DIAGNOSIS — Z794 Long term (current) use of insulin: Secondary | ICD-10-CM | POA: Insufficient documentation

## 2020-01-05 DIAGNOSIS — E1165 Type 2 diabetes mellitus with hyperglycemia: Secondary | ICD-10-CM | POA: Diagnosis not present

## 2020-01-05 DIAGNOSIS — D508 Other iron deficiency anemias: Secondary | ICD-10-CM | POA: Diagnosis not present

## 2020-01-05 DIAGNOSIS — F0390 Unspecified dementia without behavioral disturbance: Secondary | ICD-10-CM | POA: Insufficient documentation

## 2020-01-05 DIAGNOSIS — K6389 Other specified diseases of intestine: Secondary | ICD-10-CM | POA: Diagnosis not present

## 2020-01-05 DIAGNOSIS — Z8673 Personal history of transient ischemic attack (TIA), and cerebral infarction without residual deficits: Secondary | ICD-10-CM | POA: Insufficient documentation

## 2020-01-05 DIAGNOSIS — K5900 Constipation, unspecified: Secondary | ICD-10-CM | POA: Diagnosis not present

## 2020-01-05 DIAGNOSIS — Z87891 Personal history of nicotine dependence: Secondary | ICD-10-CM | POA: Insufficient documentation

## 2020-01-05 DIAGNOSIS — Z79899 Other long term (current) drug therapy: Secondary | ICD-10-CM | POA: Insufficient documentation

## 2020-01-05 DIAGNOSIS — K567 Ileus, unspecified: Secondary | ICD-10-CM | POA: Diagnosis not present

## 2020-01-05 DIAGNOSIS — K6289 Other specified diseases of anus and rectum: Secondary | ICD-10-CM | POA: Diagnosis not present

## 2020-01-05 DIAGNOSIS — L258 Unspecified contact dermatitis due to other agents: Secondary | ICD-10-CM | POA: Diagnosis not present

## 2020-01-05 DIAGNOSIS — I959 Hypotension, unspecified: Secondary | ICD-10-CM | POA: Diagnosis not present

## 2020-01-05 LAB — COMPREHENSIVE METABOLIC PANEL
ALT: 13 U/L (ref 0–44)
AST: 16 U/L (ref 15–41)
Albumin: 3.7 g/dL (ref 3.5–5.0)
Alkaline Phosphatase: 76 U/L (ref 38–126)
Anion gap: 10 (ref 5–15)
BUN: 11 mg/dL (ref 8–23)
CO2: 29 mmol/L (ref 22–32)
Calcium: 8.9 mg/dL (ref 8.9–10.3)
Chloride: 102 mmol/L (ref 98–111)
Creatinine, Ser: 0.64 mg/dL (ref 0.61–1.24)
GFR calc Af Amer: >60 mL/min (ref >60)
GFR calc non Af Amer: >60 mL/min (ref >60)
Glucose, Bld: 59 mg/dL — ABNORMAL LOW (ref 70–99)
Potassium: 3 mmol/L — ABNORMAL LOW (ref 3.5–5.1)
Sodium: 141 mmol/L (ref 135–145)
Total Bilirubin: 0.5 mg/dL (ref 0.3–1.2)
Total Protein: 8 g/dL (ref 6.5–8.1)

## 2020-01-05 LAB — CBC WITH DIFFERENTIAL/PLATELET
Abs Immature Granulocytes: 0.04 10*3/uL (ref 0.00–0.07)
Basophils Absolute: 0 10*3/uL (ref 0.0–0.1)
Basophils Relative: 0 %
Eosinophils Absolute: 0.2 10*3/uL (ref 0.0–0.5)
Eosinophils Relative: 2 %
HCT: 43.1 % (ref 39.0–52.0)
Hemoglobin: 13.3 g/dL (ref 13.0–17.0)
Immature Granulocytes: 0 %
Lymphocytes Relative: 33 %
Lymphs Abs: 3.5 10*3/uL (ref 0.7–4.0)
MCH: 27.7 pg (ref 26.0–34.0)
MCHC: 30.9 g/dL (ref 30.0–36.0)
MCV: 89.8 fL (ref 80.0–100.0)
Monocytes Absolute: 0.6 10*3/uL (ref 0.1–1.0)
Monocytes Relative: 6 %
Neutro Abs: 6.1 10*3/uL (ref 1.7–7.7)
Neutrophils Relative %: 59 %
Platelets: 180 10*3/uL (ref 150–400)
RBC: 4.8 MIL/uL (ref 4.22–5.81)
RDW: 14 % (ref 11.5–15.5)
WBC: 10.6 10*3/uL — ABNORMAL HIGH (ref 4.0–10.5)
nRBC: 0 % (ref 0.0–0.2)

## 2020-01-05 LAB — LACTIC ACID, PLASMA: Lactic Acid, Venous: 1.4 mmol/L (ref 0.5–1.9)

## 2020-01-05 LAB — LIPASE, BLOOD: Lipase: 37 U/L (ref 11–51)

## 2020-01-05 MED ORDER — IOHEXOL 300 MG/ML  SOLN
100.0000 mL | Freq: Once | INTRAMUSCULAR | Status: AC | PRN
  Administered 2020-01-05: 100 mL via INTRAVENOUS

## 2020-01-05 MED ORDER — SODIUM CHLORIDE 0.9 % IV SOLN: INTRAVENOUS | Status: DC

## 2020-01-05 NOTE — Discharge Instructions (Addendum)
Work-up here today with the CAT scan no evidence of obstruction.  There was some concern raised for may be ischemic colitis.  But lactic acid is not elevated.  Persistent evidence of a chronic ileus.  No acute problem.  Recommend discharge back to nursing facility follow-up with the infectious disease doctors that have been seen you follow-up with the gastroenterology doctors.  Return for any new or worse symptoms.

## 2020-01-05 NOTE — ED Notes (Signed)
[facility] called for transport.  

## 2020-01-05 NOTE — ED Notes (Signed)
[employer] called and Alan [name] was notified that pt will be returning back to the facility.

## 2020-01-05 NOTE — ED Provider Notes (Addendum)
[facility]-EMERGENCY DEPT Provider Note   CSN: [id number] Arrival date & time: 01/05/20  1651     History No chief complaint on file.   Alan [name]. is a 74 y.o. male.  Patient sent in from [facility] possible constipation or intestinal blockage.  Patient has a history of chronic ileus issues.  Patient here not complaining of anything.  According to [place] he was complaining of abdominal tenderness.  There was abdominal distention yesterday.  Patient has a history of dementia.  And diabetes.        Past Medical History:  Diagnosis Date  . Anemia   . Arthritis   . Dementia (HCC)   . DM (diabetes mellitus), type 2 with peripheral vascular complications (HCC)   . ETOH abuse   . Myocardial infarction (HCC)   . Peripheral arterial disease (HCC)   . Stroke [facility])     Patient Active Problem List   Diagnosis Date Noted  . Redness of both eyes 04/13/2019  . Abnormality of eyelid 04/12/2019  . Ileus (HCC) 04/09/2019  . Hypokalemia 04/09/2019  . GERD (gastroesophageal reflux disease) 04/09/2019  . BPH (benign prostatic hyperplasia) 04/09/2019  . Abdominal pain 04/08/2019  . Dementia without behavioral disturbance (HCC) 04/08/2019  . 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 C. difficile colitis 05/07/2018  . Neurocognitive deficits 02/26/2018  . Dysphagia 02/26/2018  . Uncontrolled type 2 diabetes mellitus with hyperglycemia, with long-term current use of insulin (HCC) 12/27/2017  . DKA, type 2 (HCC) 12/26/2017  . 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 Urinary tract infection associated with nephrostomy catheter (HCC) 07/07/2017  . Acute urinary retention 07/07/2017  . Acute metabolic encephalopathy 07/07/2017  . Ureteral calculus, left   . Urinary tract infection due to Klebsiella species   . Candida tropicalis infection 06/20/2017  . Bilateral nephrolithiasis 06/20/2017  . History of  nephrostomy   . Candidemia (HCC) 06/15/2017  . Obstruction of kidney   . 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 ABDOMINAL AORTOGRAM N/A 10/25/2016   Procedure: Abdominal Aortogram;  Surgeon: Sherren Kerns, MD;  Location[facility] INVASIVE CV LAB;  Service: Cardiovascular;  Laterality: N/A;  . BRAIN SURGERY    . COLONOSCOPY  2015   Negative, performed by GI in [place]  . CORONARY STENT PLACEMENT     " about 25 years ago (10/29/16)  . ENDARTERECTOMY FEMORAL Right 10/30/2016   Procedure: Eduardo Osier WITH PATCH GRAFT;  Surgeon: Sherren Kerns, MD;  Location: [facility] OR;  Service: Vascular;  Laterality: Right;  . IR NEPHRO TUBE REMOV/FL  07/04/2017  . IR NEPHRO TUBE REMOV/FL  07/07/2017  . IR NEPHROSTOMY EXCHANGE LEFT  06/20/2017  . IR NEPHROSTOMY EXCHANGE RIGHT  06/20/2017  . IR NEPHROSTOMY PLACEMENT LEFT  06/12/2017  . IR NEPHROSTOMY PLACEMENT RIGHT  06/12/2017  . IR URETERAL STENT PLACEMENT EXISTING ACCESS LEFT  06/20/2017  . IR URETERAL STENT PLACEMENT EXISTING ACCESS RIGHT  06/20/2017  . LOWER EXTREMITY ANGIOGRAPHY Right 10/25/2016   Procedure: Lower Extremity Angiography;  Surgeon: Sherren Kerns, MD;  Location: [facility] INVASIVE CV LAB;  Service: Cardiovascular;  Laterality: Right;       Family History  Problem Relation Age of Onset  . Diabetes Mother   . Heart disease Father   . Heart disease Brother     Social History  Tobacco Use  . Smoking status: Former Smoker    Types: Cigarettes  . Smokeless tobacco: Never Used  . Tobacco comment: Quit smoking cigarettes 10/ 2017  Vaping Use  . Vaping Use: Never used  Substance Use Topics  . Alcohol use: No    Comment: Alcohol abuse PMH  . Drug use: No    Home Medications Prior to Admission medications   Medication Sig Start Date End Date Taking? Authorizing Provider   acetaminophen (TYLENOL) 325 MG tablet Take 2 tablets (650 mg total) by mouth every 6 (six) hours as needed for mild pain (or Fever >/= 101). 04/13/19   Roberto Scales D, MD  Baclofen 5 MG TABS Take 5 mg by mouth 3 (three) times daily as needed. 04/13/19   Roberto Scales D, MD  Cholecalciferol (QC VITAMIN D3) 2000 units TABS Take 2,000 Units by mouth daily.     [provider]  fidaxomicin (DIFICID) 200 MG TABS tablet Take by mouth. 12/04/19   [provider]  fluticasone (FLONASE) 50 MCG/ACT nasal spray Place 2 sprays into both nostrils daily.    [provider]  geriatric multivitamins-minerals (ELDERTONIC/GEVRABON) LIQD Take 15 mLs by mouth 2 (two) times daily.    [provider]  insulin aspart (NOVOLOG) 100 UNIT/ML injection Inject 2-10 Units into the skin See admin instructions. If BS is 1-150 0U 151-200 2U 201-250 4U 251-300 6U 301-400 10U 400 CALL MD    [provider]  insulin glargine (LANTUS) 100 UNIT/ML injection Inject 18-24 Units into the skin See admin instructions. Use 18 units at bedtime & 24 units in morning 06/11/16   [provider]  KETOCONAZOLE, TOPICAL, 1 % SHAM Apply 1 application topically 2 (two) times a week. Monday and thursday    [provider]  Melatonin 10 MG TABS Take 10 mg by mouth at bedtime.    [provider]  pantoprazole (PROTONIX) 40 MG tablet Take 1 tablet (40 mg total) by mouth daily. 03/28/19   Cathren Laine, MD  polyvinyl alcohol (LIQUIFILM TEARS) 1.4 % ophthalmic solution Place 1 drop into both eyes 4 (four) times daily. 04/13/19   Laverna Peace, MD  Probiotic Product (RISA-BID PROBIOTIC PO) Take 1 capsule by mouth 2 (two) times daily.    [provider]  Psyllium (METAMUCIL FIBER) 51.7 % PACK Take 1 Package by mouth 2 (two) times daily.    [provider]  sertraline (ZOLOFT) 50 MG tablet Take 150 mg by mouth at bedtime.     [provider]  Skin  Protectants, Misc. (CALAZIME SKIN PROTECTANT EX) Apply 1 application topically 2 (two) times daily.    [provider]  tamsulosin (FLOMAX) 0.4 MG CAPS capsule Take 0.8 mg by mouth every evening.     [provider]  traZODone (DESYREL) 50 MG tablet Take 50 mg by mouth at bedtime.  09/11/16   [provider]    Allergies    Penicillins  Review of Systems   Review of Systems  Constitutional: Negative for chills and fever.  HENT: Negative for congestion, rhinorrhea and sore throat.   Eyes: Negative for visual disturbance.  Respiratory: Negative for cough and shortness of breath.   Cardiovascular: Negative for chest pain and leg swelling.  Gastrointestinal: Positive for abdominal distention, abdominal pain and constipation. Negative for diarrhea, nausea and vomiting.  Genitourinary: Negative for dysuria.  Musculoskeletal: Negative for back pain and neck pain.  Skin: Negative for rash.  Neurological: Negative for dizziness, light-headedness and  headaches.  Hematological: Does not bruise/bleed easily.  Psychiatric/Behavioral: Positive for confusion.    Physical Exam Updated Vital Signs BP 120/64 (BP Location: Left Arm)   Pulse 65   Temp 98.5 F (36.9 C) (Oral)   Resp 18   SpO2 97%   Physical Exam Vitals and nursing note reviewed.  Constitutional:      Appearance: Normal appearance. He is well-developed.  HENT:     Head: Normocephalic and atraumatic.  Eyes:     Extraocular Movements: Extraocular movements intact.     Conjunctiva/sclera: Conjunctivae normal.     Pupils: Pupils are equal, round, and reactive to light.  Cardiovascular:     Rate and Rhythm: Normal rate and regular rhythm.     Heart sounds: No murmur heard.   Pulmonary:     Effort: Pulmonary effort is normal. No respiratory distress.     Breath sounds: Normal breath sounds.  Abdominal:     General: There is no distension.     Palpations: Abdomen is soft.     Tenderness: There is no  abdominal tenderness. There is no guarding.     Comments: Abdomen soft nontender no distention  Musculoskeletal:        General: Normal range of motion.     Cervical back: Normal range of motion and neck supple.     Comments: Left below the knee amputation.  Skin:    General: Skin is warm and dry.  Neurological:     Mental Status: He is alert. Mental status is at baseline.     ED Results / Procedures / Treatments   Labs (all labs ordered are listed, but only abnormal results are displayed) Labs Reviewed - No data to display  EKG None  Radiology No results found.  Procedures Procedures (including critical care time)  Medications Ordered in ED Medications - No data to display  ED Course  I have reviewed the triage vital signs and the nursing notes.  Pertinent labs & imaging results that were available during my care of the patient were reviewed by me and considered in my medical decision making (see chart for details).    MDM Rules/Calculators/A&P                          Patient's abdomen soft and nontender.  Since he has a history of dementia CT scan abdomen done.  No acute findings other than some inflammation in the distal part of the colon.  The raise some concerns about ischemia clinical presentation not consistent with ischemia.  The lactic acid was done and was normal.  Also shows evidence of the chronic ileus.  Will refer patient back to gastroenterology as well as infectious disease.  Patient nontoxic no acute distress.  Patient stable for discharge back to nursing facility.  No evidence of bowel obstruction. Final Clinical Impression(s) / ED Diagnoses Final diagnoses:  None    Rx / DC Orders ED Discharge Orders    None       Vanetta Mulders, MD 01/05/20 2222    Vanetta Mulders, MD 01/05/20 2222

## 2020-01-05 NOTE — ED Triage Notes (Signed)
Pt BIB EMS from [facility] c/o possible constipation or intestinal blockage. Pt has hx of issues with his ileus. Endorsing abdominal tenderness. Minimal information given by staff at facility. No timeframe regarding LBM. A&Ox2 at baseline.

## 2020-01-06 DIAGNOSIS — E785 Hyperlipidemia, unspecified: Secondary | ICD-10-CM | POA: Diagnosis not present

## 2020-01-06 DIAGNOSIS — R1110 Vomiting, unspecified: Secondary | ICD-10-CM | POA: Diagnosis not present

## 2020-01-06 DIAGNOSIS — M79602 Pain in left arm: Secondary | ICD-10-CM | POA: Diagnosis not present

## 2020-01-06 DIAGNOSIS — R197 Diarrhea, unspecified: Secondary | ICD-10-CM | POA: Diagnosis not present

## 2020-01-06 DIAGNOSIS — Z8719 Personal history of other diseases of the digestive system: Secondary | ICD-10-CM | POA: Diagnosis not present

## 2020-01-06 DIAGNOSIS — Z79899 Other long term (current) drug therapy: Secondary | ICD-10-CM | POA: Diagnosis not present

## 2020-01-06 DIAGNOSIS — R079 Chest pain, unspecified: Secondary | ICD-10-CM | POA: Diagnosis not present

## 2020-01-06 DIAGNOSIS — L258 Unspecified contact dermatitis due to other agents: Secondary | ICD-10-CM | POA: Diagnosis not present

## 2020-01-06 DIAGNOSIS — K5900 Constipation, unspecified: Secondary | ICD-10-CM | POA: Diagnosis not present

## 2020-01-06 DIAGNOSIS — D696 Thrombocytopenia, unspecified: Secondary | ICD-10-CM | POA: Diagnosis not present

## 2020-01-06 DIAGNOSIS — I739 Peripheral vascular disease, unspecified: Secondary | ICD-10-CM | POA: Diagnosis not present

## 2020-01-06 DIAGNOSIS — M6281 Muscle weakness (generalized): Secondary | ICD-10-CM | POA: Diagnosis not present

## 2020-01-06 DIAGNOSIS — D508 Other iron deficiency anemias: Secondary | ICD-10-CM | POA: Diagnosis not present

## 2020-01-06 DIAGNOSIS — I2510 Atherosclerotic heart disease of native coronary artery without angina pectoris: Secondary | ICD-10-CM | POA: Diagnosis not present

## 2020-01-06 DIAGNOSIS — Z794 Long term (current) use of insulin: Secondary | ICD-10-CM | POA: Diagnosis not present

## 2020-01-06 DIAGNOSIS — F0390 Unspecified dementia without behavioral disturbance: Secondary | ICD-10-CM | POA: Diagnosis not present

## 2020-01-06 DIAGNOSIS — N400 Enlarged prostate without lower urinary tract symptoms: Secondary | ICD-10-CM | POA: Diagnosis not present

## 2020-01-06 DIAGNOSIS — Z7401 Bed confinement status: Secondary | ICD-10-CM | POA: Diagnosis not present

## 2020-01-06 DIAGNOSIS — R5381 Other malaise: Secondary | ICD-10-CM | POA: Diagnosis not present

## 2020-01-06 DIAGNOSIS — E870 Hyperosmolality and hypernatremia: Secondary | ICD-10-CM | POA: Diagnosis not present

## 2020-01-06 DIAGNOSIS — G8929 Other chronic pain: Secondary | ICD-10-CM | POA: Diagnosis not present

## 2020-01-06 DIAGNOSIS — R531 Weakness: Secondary | ICD-10-CM | POA: Diagnosis not present

## 2020-01-06 DIAGNOSIS — R296 Repeated falls: Secondary | ICD-10-CM | POA: Diagnosis not present

## 2020-01-06 DIAGNOSIS — E1140 Type 2 diabetes mellitus with diabetic neuropathy, unspecified: Secondary | ICD-10-CM | POA: Diagnosis not present

## 2020-01-06 DIAGNOSIS — I959 Hypotension, unspecified: Secondary | ICD-10-CM | POA: Diagnosis not present

## 2020-01-06 DIAGNOSIS — K30 Functional dyspepsia: Secondary | ICD-10-CM | POA: Diagnosis not present

## 2020-01-06 DIAGNOSIS — M1990 Unspecified osteoarthritis, unspecified site: Secondary | ICD-10-CM | POA: Diagnosis not present

## 2020-01-06 DIAGNOSIS — N390 Urinary tract infection, site not specified: Secondary | ICD-10-CM | POA: Diagnosis not present

## 2020-01-06 DIAGNOSIS — R279 Unspecified lack of coordination: Secondary | ICD-10-CM | POA: Diagnosis not present

## 2020-01-06 DIAGNOSIS — M810 Age-related osteoporosis without current pathological fracture: Secondary | ICD-10-CM | POA: Diagnosis not present

## 2020-01-06 DIAGNOSIS — A498 Other bacterial infections of unspecified site: Secondary | ICD-10-CM | POA: Diagnosis not present

## 2020-01-06 DIAGNOSIS — K567 Ileus, unspecified: Secondary | ICD-10-CM | POA: Diagnosis not present

## 2020-01-06 DIAGNOSIS — E559 Vitamin D deficiency, unspecified: Secondary | ICD-10-CM | POA: Diagnosis not present

## 2020-01-06 DIAGNOSIS — E1142 Type 2 diabetes mellitus with diabetic polyneuropathy: Secondary | ICD-10-CM | POA: Diagnosis not present

## 2020-01-06 DIAGNOSIS — Z743 Need for continuous supervision: Secondary | ICD-10-CM | POA: Diagnosis not present

## 2020-01-06 DIAGNOSIS — Z7982 Long term (current) use of aspirin: Secondary | ICD-10-CM | POA: Diagnosis not present

## 2020-01-06 DIAGNOSIS — E639 Nutritional deficiency, unspecified: Secondary | ICD-10-CM | POA: Diagnosis not present

## 2020-01-06 DIAGNOSIS — F419 Anxiety disorder, unspecified: Secondary | ICD-10-CM | POA: Diagnosis not present

## 2020-01-06 DIAGNOSIS — Z89421 Acquired absence of other right toe(s): Secondary | ICD-10-CM | POA: Diagnosis not present

## 2020-01-06 DIAGNOSIS — K219 Gastro-esophageal reflux disease without esophagitis: Secondary | ICD-10-CM | POA: Diagnosis not present

## 2020-01-06 DIAGNOSIS — R32 Unspecified urinary incontinence: Secondary | ICD-10-CM | POA: Diagnosis not present

## 2020-01-06 DIAGNOSIS — R109 Unspecified abdominal pain: Secondary | ICD-10-CM | POA: Diagnosis not present

## 2020-01-06 DIAGNOSIS — Z89512 Acquired absence of left leg below knee: Secondary | ICD-10-CM | POA: Diagnosis not present

## 2020-01-06 DIAGNOSIS — R112 Nausea with vomiting, unspecified: Secondary | ICD-10-CM | POA: Diagnosis not present

## 2020-01-06 DIAGNOSIS — M25532 Pain in left wrist: Secondary | ICD-10-CM | POA: Diagnosis not present

## 2020-01-06 DIAGNOSIS — E119 Type 2 diabetes mellitus without complications: Secondary | ICD-10-CM | POA: Diagnosis not present

## 2020-01-06 DIAGNOSIS — M2550 Pain in unspecified joint: Secondary | ICD-10-CM | POA: Diagnosis not present

## 2020-01-06 DIAGNOSIS — F0151 Vascular dementia with behavioral disturbance: Secondary | ICD-10-CM | POA: Diagnosis not present

## 2020-01-06 DIAGNOSIS — R140 Abdominal distension (gaseous): Secondary | ICD-10-CM | POA: Diagnosis not present

## 2020-01-06 DIAGNOSIS — Z87891 Personal history of nicotine dependence: Secondary | ICD-10-CM | POA: Diagnosis not present

## 2020-01-06 DIAGNOSIS — M21822 Other specified acquired deformities of left upper arm: Secondary | ICD-10-CM | POA: Diagnosis not present

## 2020-01-06 DIAGNOSIS — G4700 Insomnia, unspecified: Secondary | ICD-10-CM | POA: Diagnosis not present

## 2020-01-06 DIAGNOSIS — A0472 Enterocolitis due to Clostridium difficile, not specified as recurrent: Secondary | ICD-10-CM | POA: Diagnosis not present

## 2020-01-06 DIAGNOSIS — E1165 Type 2 diabetes mellitus with hyperglycemia: Secondary | ICD-10-CM | POA: Diagnosis not present

## 2020-01-06 DIAGNOSIS — F329 Major depressive disorder, single episode, unspecified: Secondary | ICD-10-CM | POA: Diagnosis not present

## 2020-01-06 DIAGNOSIS — E876 Hypokalemia: Secondary | ICD-10-CM | POA: Diagnosis not present

## 2020-01-06 DIAGNOSIS — D649 Anemia, unspecified: Secondary | ICD-10-CM | POA: Diagnosis not present

## 2020-01-06 DIAGNOSIS — E11618 Type 2 diabetes mellitus with other diabetic arthropathy: Secondary | ICD-10-CM | POA: Diagnosis not present

## 2020-01-06 DIAGNOSIS — F0391 Unspecified dementia with behavioral disturbance: Secondary | ICD-10-CM | POA: Diagnosis not present

## 2020-01-06 DIAGNOSIS — M549 Dorsalgia, unspecified: Secondary | ICD-10-CM | POA: Diagnosis not present

## 2020-01-06 DIAGNOSIS — E860 Dehydration: Secondary | ICD-10-CM | POA: Diagnosis not present

## 2020-01-06 DIAGNOSIS — Z515 Encounter for palliative care: Secondary | ICD-10-CM | POA: Diagnosis not present

## 2020-01-06 DIAGNOSIS — M79632 Pain in left forearm: Secondary | ICD-10-CM | POA: Diagnosis not present

## 2020-01-06 DIAGNOSIS — Z0001 Encounter for general adult medical examination with abnormal findings: Secondary | ICD-10-CM | POA: Diagnosis not present

## 2020-01-06 DIAGNOSIS — A0471 Enterocolitis due to Clostridium difficile, recurrent: Secondary | ICD-10-CM | POA: Diagnosis not present

## 2020-01-08 DIAGNOSIS — E785 Hyperlipidemia, unspecified: Secondary | ICD-10-CM | POA: Diagnosis not present

## 2020-01-08 DIAGNOSIS — R531 Weakness: Secondary | ICD-10-CM | POA: Diagnosis not present

## 2020-01-08 DIAGNOSIS — E559 Vitamin D deficiency, unspecified: Secondary | ICD-10-CM | POA: Diagnosis not present

## 2020-01-08 DIAGNOSIS — E1142 Type 2 diabetes mellitus with diabetic polyneuropathy: Secondary | ICD-10-CM | POA: Diagnosis not present

## 2020-01-08 DIAGNOSIS — N400 Enlarged prostate without lower urinary tract symptoms: Secondary | ICD-10-CM | POA: Diagnosis not present

## 2020-01-08 DIAGNOSIS — K219 Gastro-esophageal reflux disease without esophagitis: Secondary | ICD-10-CM | POA: Diagnosis not present

## 2020-01-10 ENCOUNTER — Ambulatory Visit: Payer: Medicare Other | Admitting: Internal Medicine

## 2020-01-10 DIAGNOSIS — K219 Gastro-esophageal reflux disease without esophagitis: Secondary | ICD-10-CM | POA: Diagnosis not present

## 2020-01-10 DIAGNOSIS — K567 Ileus, unspecified: Secondary | ICD-10-CM | POA: Diagnosis not present

## 2020-01-10 DIAGNOSIS — E1142 Type 2 diabetes mellitus with diabetic polyneuropathy: Secondary | ICD-10-CM | POA: Diagnosis not present

## 2020-01-10 DIAGNOSIS — D508 Other iron deficiency anemias: Secondary | ICD-10-CM | POA: Diagnosis not present

## 2020-01-12 DIAGNOSIS — N400 Enlarged prostate without lower urinary tract symptoms: Secondary | ICD-10-CM | POA: Diagnosis not present

## 2020-01-12 DIAGNOSIS — G4700 Insomnia, unspecified: Secondary | ICD-10-CM | POA: Diagnosis not present

## 2020-01-12 DIAGNOSIS — D508 Other iron deficiency anemias: Secondary | ICD-10-CM | POA: Diagnosis not present

## 2020-01-12 DIAGNOSIS — K567 Ileus, unspecified: Secondary | ICD-10-CM | POA: Diagnosis not present

## 2020-01-14 DIAGNOSIS — E1140 Type 2 diabetes mellitus with diabetic neuropathy, unspecified: Secondary | ICD-10-CM | POA: Diagnosis not present

## 2020-01-14 DIAGNOSIS — D508 Other iron deficiency anemias: Secondary | ICD-10-CM | POA: Diagnosis not present

## 2020-01-14 DIAGNOSIS — E1142 Type 2 diabetes mellitus with diabetic polyneuropathy: Secondary | ICD-10-CM | POA: Diagnosis not present

## 2020-01-14 DIAGNOSIS — N400 Enlarged prostate without lower urinary tract symptoms: Secondary | ICD-10-CM | POA: Diagnosis not present

## 2020-01-14 DIAGNOSIS — F0391 Unspecified dementia with behavioral disturbance: Secondary | ICD-10-CM | POA: Diagnosis not present

## 2020-01-14 DIAGNOSIS — E559 Vitamin D deficiency, unspecified: Secondary | ICD-10-CM | POA: Diagnosis not present

## 2020-01-14 DIAGNOSIS — F329 Major depressive disorder, single episode, unspecified: Secondary | ICD-10-CM | POA: Diagnosis not present

## 2020-01-14 DIAGNOSIS — M810 Age-related osteoporosis without current pathological fracture: Secondary | ICD-10-CM | POA: Diagnosis not present

## 2020-01-14 DIAGNOSIS — E785 Hyperlipidemia, unspecified: Secondary | ICD-10-CM | POA: Diagnosis not present

## 2020-01-17 DIAGNOSIS — E1142 Type 2 diabetes mellitus with diabetic polyneuropathy: Secondary | ICD-10-CM | POA: Diagnosis not present

## 2020-01-17 DIAGNOSIS — A498 Other bacterial infections of unspecified site: Secondary | ICD-10-CM | POA: Diagnosis not present

## 2020-01-17 DIAGNOSIS — D508 Other iron deficiency anemias: Secondary | ICD-10-CM | POA: Diagnosis not present

## 2020-01-17 DIAGNOSIS — K567 Ileus, unspecified: Secondary | ICD-10-CM | POA: Diagnosis not present

## 2020-01-18 ENCOUNTER — Ambulatory Visit (INDEPENDENT_AMBULATORY_CARE_PROVIDER_SITE_OTHER): Payer: Medicare Other | Admitting: Internal Medicine

## 2020-01-18 ENCOUNTER — Other Ambulatory Visit: Payer: Self-pay

## 2020-01-18 ENCOUNTER — Encounter: Payer: Self-pay | Admitting: Internal Medicine

## 2020-01-18 VITALS — BP 116/76 | HR 62

## 2020-01-18 DIAGNOSIS — A0471 Enterocolitis due to Clostridium difficile, recurrent: Secondary | ICD-10-CM | POA: Diagnosis not present

## 2020-01-18 NOTE — Patient Instructions (Signed)
Patient is not currently having cdifficile infection. Getting enemas to relieve chronic ileus  A/P: hx of recurrent cdifficile. Most recent history does not suggest active cdifficile infection 1) recommend to check bmp one-twice a week if you are doing daily enemas since can have potassium loss 2) agree with plan to follow up with GI to manage chronic ileus 3) if needs treatment for uti, would avoid fluoroquinolones and try to use other antibiotics less associated with cdifficile 4) if having watery diarrhea not associated with enemas would consider retesting to see if cdifficile recurrence.  5) rtc as needed 6) if he has recurrence - recommend to do prolonged oral vancomycin taper

## 2020-01-18 NOTE — Progress Notes (Signed)
Patient ID: Alan Seeds., male   DOB: 1945-11-15, 74 y.o.   MRN: [id number]  HPI Alan [name] is a 74yo M with history of recurrent cdifficile and chronic ileus. He previously was seen by Dr Luciana Axe in mid June to finish out his oral vancomycin taper. He is here for follow up with health aid. Spoke with his RN saying that the patient is receiving daily enema to treat ileus. Patient denies diarrhea. Abdominal xray done daily to evaluate for obstruction.  He has been followed by [facility] by [facility] who is to see at the end of August.  Last time tested for tested for cdifficile - which is negative on July 13th 2021 Out of precautions. No longer getting treatment for cdifficile  Outpatient Encounter Medications as of 01/18/2020  Medication Sig  . Baclofen 5 MG TABS Take 5 mg by mouth 3 (three) times daily as needed.  . bisacodyl (DULCOLAX) 10 MG suppository Place 10 mg rectally once.  . busPIRone (BUSPAR) 15 MG tablet Take 15 mg by mouth 2 (two) times daily.  . Cholecalciferol (QC VITAMIN D3) 2000 units TABS Take 2,000 Units by mouth daily.   . cholestyramine (QUESTRAN) 4 g packet Take 4 g by mouth 4 (four) times daily.  . famotidine (PEPCID) 20 MG tablet Take 20 mg by mouth daily as needed for heartburn or indigestion.  . ferrous sulfate 325 (65 FE) MG tablet Take 325 mg by mouth daily.  . fluticasone (FLONASE) 50 MCG/ACT nasal spray Place 2 sprays into both nostrils daily.  Marland Kitchen geriatric multivitamins-minerals (ELDERTONIC/GEVRABON) LIQD Take 15 mLs by mouth 2 (two) times daily.  . insulin aspart (NOVOLOG) 100 UNIT/ML injection Inject 0-10 Units into the skin in the morning, at noon, in the evening, and at bedtime. Sliding scale : If BS is 1-150 0U 151-200 2U 201-250 4U 251-300 6U 301-350 8U 351-400 10U If greater than 400 call MD  . insulin glargine (LANTUS) 100 UNIT/ML injection Inject 14-23 Units into the skin See admin instructions. Inject 23 units in the morning and 14 units at bedtime   . loperamide (IMODIUM A-D) 2 MG tablet Take 2 mg by mouth 4 (four) times daily as needed for diarrhea or loose stools.  . Menthol-Zinc Oxide (REMEDY CALAZIME) 0.4-20.5 % PSTE Apply 1 application topically in the morning, at noon, and at bedtime.  Marland Kitchen OLANZapine (ZYPREXA) 2.5 MG tablet Take 2.5 mg by mouth at bedtime.  . polyvinyl alcohol (LIQUIFILM TEARS) 1.4 % ophthalmic solution Place 1 drop into both eyes 4 (four) times daily.  . rifaximin (XIFAXAN) 200 MG tablet Take 400 mg by mouth 3 (three) times daily.  . sertraline (ZOLOFT) 50 MG tablet Take 200 mg by mouth daily.   . simethicone (MYLICON) 80 MG chewable tablet Chew 80 mg by mouth every 6 (six) hours as needed for flatulence.  . Skin Protectants, Misc. (CALAZIME SKIN PROTECTANT EX) Apply 1 application topically 2 (two) times daily.   . tamsulosin (FLOMAX) 0.4 MG CAPS capsule Take 0.8 mg by mouth every evening.   . traZODone (DESYREL) 50 MG tablet Take 50 mg by mouth at bedtime.   Marland Kitchen acetaminophen (TYLENOL) 325 MG tablet Take 2 tablets (650 mg total) by mouth every 6 (six) hours as needed for mild pain (or Fever >/= 101). (Patient not taking: Reported on 01/05/2020)  . fidaxomicin (DIFICID) 200 MG TABS tablet Take 200 mg by mouth 2 (two) times daily.  (Patient not taking: Reported on 01/18/2020)  . KETOCONAZOLE, TOPICAL,  1 % SHAM Apply 1 application topically 2 (two) times a week. Monday and thursday (Patient not taking: Reported on 01/05/2020)  . Melatonin 10 MG TABS Take 10 mg by mouth at bedtime. (Patient not taking: Reported on 01/18/2020)  . pantoprazole (PROTONIX) 40 MG tablet Take 1 tablet (40 mg total) by mouth daily. (Patient not taking: Reported on 01/05/2020)  . potassium chloride SA (KLOR-CON) 20 MEQ tablet Take 40 mEq by mouth 3 (three) times daily. (Patient not taking: Reported on 01/18/2020)  . Probiotic Product (RISA-BID PROBIOTIC PO) Take 1 capsule by mouth 2 (two) times daily. (Patient not taking: Reported on 01/05/2020)  . Psyllium  (METAMUCIL FIBER) 51.7 % PACK Take 1 Package by mouth 2 (two) times daily. (Patient not taking: Reported on 01/05/2020)  . saccharomyces boulardii (FLORASTOR) 250 MG capsule Take 250 mg by mouth 2 (two) times daily. (Patient not taking: Reported on 01/18/2020)  . sodium phosphate (FLEET) 7-19 GM/118ML ENEM Place 1 enema rectally once. (Patient not taking: Reported on 01/18/2020)   No facility-administered encounter medications on file as of 01/18/2020.     Patient Active Problem List   Diagnosis Date Noted  . Redness of both eyes 04/13/2019  . Abnormality of eyelid 04/12/2019  . Ileus (HCC) 04/09/2019  . Hypokalemia 04/09/2019  . GERD (gastroesophageal reflux disease) 04/09/2019  . BPH (benign prostatic hyperplasia) 04/09/2019  . Abdominal pain 04/08/2019  . Dementia without behavioral disturbance (HCC) 04/08/2019  . 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 C. difficile colitis 05/07/2018  . Neurocognitive deficits 02/26/2018  . Dysphagia 02/26/2018  . Uncontrolled type 2 diabetes mellitus with hyperglycemia, with long-term current use of insulin (HCC) 12/27/2017  . DKA, type 2 (HCC) 12/26/2017  . 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 Urinary tract infection associated with nephrostomy catheter (HCC) 07/07/2017  . Acute urinary retention 07/07/2017  . Acute metabolic encephalopathy 07/07/2017  . Ureteral calculus, left   . Urinary tract infection due to Klebsiella species   . Candida tropicalis infection 06/20/2017  . Bilateral nephrolithiasis 06/20/2017  . History of nephrostomy   . Candidemia (HCC) 06/15/2017  . Obstruction of kidney   . 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 Hepatitis C Screening  Never done  . FOOT EXAM  Never done  . OPHTHALMOLOGY EXAM  Never done  . URINE MICROALBUMIN  Never done  . COVID-19  Vaccine (1) Never done  . TETANUS/TDAP  Never done  . COLONOSCOPY  Never done  . PNA vac Low Risk Adult (2 of 2 - PPSV23) 10/03/2015  . HEMOGLOBIN A1C  10/07/2019     Review of Systems  Physical Exam   BP 116/76   Pulse 62    Physical Exam  Constitutional: He is oriented to person, place, and time. He appears well-developed and well-nourished. No distress.  HENT:  Mouth/Throat: Oropharynx is clear and moist. No oropharyngeal exudate.  Cardiovascular: Normal rate, regular rhythm and normal heart sounds. Exam reveals no gallop and no friction rub.  No murmur heard.  Pulmonary/Chest: Effort normal and breath sounds normal. No respiratory distress. He has no wheezes.  Abdominal: Soft. Bowel sounds are normal. He exhibits no distension. There is no tenderness.  OIZ:TIWP BKA, and right foot TM Neurological: He is alert and oriented to person, place, and time.  Skin: Skin is warm and dry. No rash noted. No erythema.  Psychiatric: He has a normal mood and affect. His behavior is normal.    CBC Lab Results  Component Value Date   WBC 10.6 (H) 01/05/2020   RBC 4.80 01/05/2020   HGB 13.3 01/05/2020   HCT 43.1 01/05/2020   PLT 180 01/05/2020   MCV 89.8 01/05/2020   MCH 27.7 01/05/2020   MCHC 30.9 01/05/2020   RDW 14.0 01/05/2020   LYMPHSABS 3.5 01/05/2020   MONOABS 0.6 01/05/2020   EOSABS 0.2 01/05/2020    BMET Lab Results  Component Value Date   NA 141 01/05/2020   K 3.0 (L) 01/05/2020   CL 102 01/05/2020   CO2 29 01/05/2020   GLUCOSE 59 (L) 01/05/2020   BUN 11 01/05/2020   CREATININE 0.64 01/05/2020   CALCIUM 8.9 01/05/2020   GFRNONAA >60 01/05/2020   GFRAA >60 01/05/2020    Assessment and Plan  History of recurrent cdifficilie = currently doesn't appear symptomatic. Recommend to continue with observing for any signs of diarrhea. Would avoid getting any fluoroquinolones for UTI.  If has watery diarrhea (if not related to enemas), would consider retesting for  cdifficile  Protein malnutrition = continue with protein supplementation

## 2020-01-19 DIAGNOSIS — G4700 Insomnia, unspecified: Secondary | ICD-10-CM | POA: Diagnosis not present

## 2020-01-19 DIAGNOSIS — K567 Ileus, unspecified: Secondary | ICD-10-CM | POA: Diagnosis not present

## 2020-01-19 DIAGNOSIS — D508 Other iron deficiency anemias: Secondary | ICD-10-CM | POA: Diagnosis not present

## 2020-01-19 DIAGNOSIS — N400 Enlarged prostate without lower urinary tract symptoms: Secondary | ICD-10-CM | POA: Diagnosis not present

## 2020-01-19 DIAGNOSIS — F0391 Unspecified dementia with behavioral disturbance: Secondary | ICD-10-CM | POA: Diagnosis not present

## 2020-01-20 DIAGNOSIS — N400 Enlarged prostate without lower urinary tract symptoms: Secondary | ICD-10-CM | POA: Diagnosis not present

## 2020-01-20 DIAGNOSIS — D508 Other iron deficiency anemias: Secondary | ICD-10-CM | POA: Diagnosis not present

## 2020-01-20 DIAGNOSIS — K567 Ileus, unspecified: Secondary | ICD-10-CM | POA: Diagnosis not present

## 2020-01-20 DIAGNOSIS — G4700 Insomnia, unspecified: Secondary | ICD-10-CM | POA: Diagnosis not present

## 2020-01-20 DIAGNOSIS — E1142 Type 2 diabetes mellitus with diabetic polyneuropathy: Secondary | ICD-10-CM | POA: Diagnosis not present

## 2020-01-24 DIAGNOSIS — E1142 Type 2 diabetes mellitus with diabetic polyneuropathy: Secondary | ICD-10-CM | POA: Diagnosis not present

## 2020-01-24 DIAGNOSIS — D508 Other iron deficiency anemias: Secondary | ICD-10-CM | POA: Diagnosis not present

## 2020-01-24 DIAGNOSIS — E860 Dehydration: Secondary | ICD-10-CM | POA: Diagnosis not present

## 2020-01-24 DIAGNOSIS — E870 Hyperosmolality and hypernatremia: Secondary | ICD-10-CM | POA: Diagnosis not present

## 2020-01-24 DIAGNOSIS — K567 Ileus, unspecified: Secondary | ICD-10-CM | POA: Diagnosis not present

## 2020-01-25 DIAGNOSIS — G4700 Insomnia, unspecified: Secondary | ICD-10-CM | POA: Diagnosis not present

## 2020-01-25 DIAGNOSIS — F419 Anxiety disorder, unspecified: Secondary | ICD-10-CM | POA: Diagnosis not present

## 2020-01-25 DIAGNOSIS — F329 Major depressive disorder, single episode, unspecified: Secondary | ICD-10-CM | POA: Diagnosis not present

## 2020-01-25 DIAGNOSIS — F0391 Unspecified dementia with behavioral disturbance: Secondary | ICD-10-CM | POA: Diagnosis not present

## 2020-01-26 DIAGNOSIS — E870 Hyperosmolality and hypernatremia: Secondary | ICD-10-CM | POA: Diagnosis not present

## 2020-01-26 DIAGNOSIS — K567 Ileus, unspecified: Secondary | ICD-10-CM | POA: Diagnosis not present

## 2020-01-26 DIAGNOSIS — K219 Gastro-esophageal reflux disease without esophagitis: Secondary | ICD-10-CM | POA: Diagnosis not present

## 2020-01-26 DIAGNOSIS — E1142 Type 2 diabetes mellitus with diabetic polyneuropathy: Secondary | ICD-10-CM | POA: Diagnosis not present

## 2020-01-26 DIAGNOSIS — E876 Hypokalemia: Secondary | ICD-10-CM | POA: Diagnosis not present

## 2020-01-26 DIAGNOSIS — L258 Unspecified contact dermatitis due to other agents: Secondary | ICD-10-CM | POA: Diagnosis not present

## 2020-02-01 DIAGNOSIS — M810 Age-related osteoporosis without current pathological fracture: Secondary | ICD-10-CM | POA: Diagnosis not present

## 2020-02-01 DIAGNOSIS — E785 Hyperlipidemia, unspecified: Secondary | ICD-10-CM | POA: Diagnosis not present

## 2020-02-01 DIAGNOSIS — E1142 Type 2 diabetes mellitus with diabetic polyneuropathy: Secondary | ICD-10-CM | POA: Diagnosis not present

## 2020-02-01 DIAGNOSIS — D508 Other iron deficiency anemias: Secondary | ICD-10-CM | POA: Diagnosis not present

## 2020-02-01 DIAGNOSIS — N400 Enlarged prostate without lower urinary tract symptoms: Secondary | ICD-10-CM | POA: Diagnosis not present

## 2020-02-01 DIAGNOSIS — E559 Vitamin D deficiency, unspecified: Secondary | ICD-10-CM | POA: Diagnosis not present

## 2020-02-01 DIAGNOSIS — E1140 Type 2 diabetes mellitus with diabetic neuropathy, unspecified: Secondary | ICD-10-CM | POA: Diagnosis not present

## 2020-02-01 DIAGNOSIS — F0390 Unspecified dementia without behavioral disturbance: Secondary | ICD-10-CM | POA: Diagnosis not present

## 2020-02-01 DIAGNOSIS — F0391 Unspecified dementia with behavioral disturbance: Secondary | ICD-10-CM | POA: Diagnosis not present

## 2020-02-01 DIAGNOSIS — F329 Major depressive disorder, single episode, unspecified: Secondary | ICD-10-CM | POA: Diagnosis not present

## 2020-02-07 DIAGNOSIS — R32 Unspecified urinary incontinence: Secondary | ICD-10-CM | POA: Diagnosis not present

## 2020-02-07 DIAGNOSIS — L258 Unspecified contact dermatitis due to other agents: Secondary | ICD-10-CM | POA: Diagnosis not present

## 2020-02-07 DIAGNOSIS — E1142 Type 2 diabetes mellitus with diabetic polyneuropathy: Secondary | ICD-10-CM | POA: Diagnosis not present

## 2020-02-07 DIAGNOSIS — D508 Other iron deficiency anemias: Secondary | ICD-10-CM | POA: Diagnosis not present

## 2020-02-07 DIAGNOSIS — K567 Ileus, unspecified: Secondary | ICD-10-CM | POA: Diagnosis not present

## 2020-02-11 ENCOUNTER — Telehealth: Payer: Self-pay

## 2020-02-11 NOTE — Telephone Encounter (Signed)
Spoke with Huntley Dec, Social Worker from [employer] [facility] regarding Palliative care referral. Confirmed receipt of referral. Will reach out to NP to inquire of when plan is to see patient and will update Huntley Dec.

## 2020-02-16 ENCOUNTER — Non-Acute Institutional Stay: Payer: Medicare Other | Admitting: Internal Medicine

## 2020-02-16 DIAGNOSIS — K567 Ileus, unspecified: Secondary | ICD-10-CM | POA: Diagnosis not present

## 2020-02-16 DIAGNOSIS — F0151 Vascular dementia with behavioral disturbance: Secondary | ICD-10-CM | POA: Diagnosis not present

## 2020-02-16 DIAGNOSIS — Z515 Encounter for palliative care: Secondary | ICD-10-CM | POA: Diagnosis not present

## 2020-02-16 DIAGNOSIS — D508 Other iron deficiency anemias: Secondary | ICD-10-CM | POA: Diagnosis not present

## 2020-02-16 DIAGNOSIS — L258 Unspecified contact dermatitis due to other agents: Secondary | ICD-10-CM | POA: Diagnosis not present

## 2020-02-16 DIAGNOSIS — R32 Unspecified urinary incontinence: Secondary | ICD-10-CM | POA: Diagnosis not present

## 2020-02-16 DIAGNOSIS — F01518 Vascular dementia, unspecified severity, with other behavioral disturbance: Secondary | ICD-10-CM

## 2020-02-17 ENCOUNTER — Other Ambulatory Visit: Payer: Self-pay

## 2020-02-17 DIAGNOSIS — E1142 Type 2 diabetes mellitus with diabetic polyneuropathy: Secondary | ICD-10-CM | POA: Diagnosis not present

## 2020-02-17 DIAGNOSIS — R32 Unspecified urinary incontinence: Secondary | ICD-10-CM | POA: Diagnosis not present

## 2020-02-17 DIAGNOSIS — Z0001 Encounter for general adult medical examination with abnormal findings: Secondary | ICD-10-CM | POA: Diagnosis not present

## 2020-02-17 DIAGNOSIS — D508 Other iron deficiency anemias: Secondary | ICD-10-CM | POA: Diagnosis not present

## 2020-02-17 DIAGNOSIS — L258 Unspecified contact dermatitis due to other agents: Secondary | ICD-10-CM | POA: Diagnosis not present

## 2020-02-17 NOTE — Progress Notes (Signed)
Therapist, nutritional Palliative Care Consult Note Telephone: [phone]  Fax: [phone]  PATIENT NAME: Alan [name]. DOB: 22-Jul-1945 MRN: [id number]  PRIMARY CARE PROVIDER:   Dr. Kinnie Feil REFERRING PROVIDER:  Dr. Flossie [place] \ RESPONSIBLE PARTYJahn, Franchini Spouse [phone]  [phone]      RECOMMENDATIONS and PLAN:  Palliative care encounter  Z51.5  1.  Advance care planning:  Current code status is DNAR(copy noted at nurse's station and in Epic).  Plans for full discussion with wife related to introduction of palliative care, goals for longterm care and additional advanced directives.  Pt is unable to have an in depth discussion due to cognitive deficits.  Palliative care will f/u with patient.  2.  Vascular dementia with behavioral disturbances:  FAST stage 7a.  Review current medications with psychiatry nurse to optimize management of aggressive behaviors. Continue supportive care and monitor for changes in cognitive and functional status.  3.  Dysphagia:  High risk for aspiration.  Continue aspiration precautions.  Meal modifications as recommended by speech therapist  I spent 40 minutes providing this consultation,  from 1145 to 1225. More than 50% of the time in this consultation was spent coordinating communication with clinical staff and patient.  Medical records were reviewed.   HISTORY OF PRESENT ILLNESS:  Alan [name]. is a 74 y.o. year old male with multiple medical problems including T2DM, PAD, hx of a CVA and vascular dementia with behaviors.  Clinical staff report intermittent agitation and aggression during evening hours. He is able to feed self, is non ambulatory due to previous amputations and requires total care.  He is incontinent of B&B. Palliative Care was asked to help address goals of care.   CODE STATUS: DNAR  PPS: 30% HOSPICE ELIGIBILITY/DIAGNOSIS: TBD  PAST MEDICAL HISTORY:  Past Medical  History:  Diagnosis Date   Anemia    Arthritis    Dementia (HCC)    DM (diabetes mellitus), type 2 with peripheral vascular complications (HCC)    ETOH abuse    Myocardial infarction (HCC)    Peripheral arterial disease (HCC)    Stroke (HCC)      ALLERGIES:  Allergies  Allergen Reactions   Penicillins Rash    Has patient had a PCN reaction causing immediate rash, facial/tongue/throat swelling, SOB or lightheadedness with hypotension: Yes Has patient had a PCN reaction causing severe rash involving mucus membranes or skin necrosis: No Has patient had a PCN reaction that required hospitalization No Has patient had a PCN reaction occurring within the last 10 years: No If all of the above answers are "NO", then may proceed with Cephalosporin use.      PERTINENT MEDICATIONS:  Outpatient Encounter Medications as of 02/16/2020  Medication Sig   acetaminophen (TYLENOL) 325 MG tablet Take 2 tablets (650 mg total) by mouth every 6 (six) hours as needed for mild pain (or Fever >/= 101). (Patient not taking: Reported on 01/05/2020)   Baclofen 5 MG TABS Take 5 mg by mouth 3 (three) times daily as needed.   bisacodyl (DULCOLAX) 10 MG suppository Place 10 mg rectally once.   busPIRone (BUSPAR) 15 MG tablet Take 15 mg by mouth 2 (two) times daily.   Cholecalciferol (QC VITAMIN D3) 2000 units TABS Take 2,000 Units by mouth daily.    cholestyramine (QUESTRAN) 4 g packet Take 4 g by mouth 4 (four) times daily.   famotidine (PEPCID) 20 MG tablet Take 20 mg by mouth daily as  needed for heartburn or indigestion.   ferrous sulfate 325 (65 FE) MG tablet Take 325 mg by mouth daily.   fidaxomicin (DIFICID) 200 MG TABS tablet Take 200 mg by mouth 2 (two) times daily.  (Patient not taking: Reported on 01/18/2020)   fluticasone (FLONASE) 50 MCG/ACT nasal spray Place 2 sprays into both nostrils daily.   geriatric multivitamins-minerals (ELDERTONIC/GEVRABON) LIQD Take 15 mLs by mouth 2 (two)  times daily.   insulin aspart (NOVOLOG) 100 UNIT/ML injection Inject 0-10 Units into the skin in the morning, at noon, in the evening, and at bedtime. Sliding scale : If BS is 1-150 0U 151-200 2U 201-250 4U 251-300 6U 301-350 8U 351-400 10U If greater than 400 call MD   insulin glargine (LANTUS) 100 UNIT/ML injection Inject 14-23 Units into the skin See admin instructions. Inject 23 units in the morning and 14 units at bedtime   KETOCONAZOLE, TOPICAL, 1 % SHAM Apply 1 application topically 2 (two) times a week. Monday and thursday (Patient not taking: Reported on 01/05/2020)   loperamide (IMODIUM A-D) 2 MG tablet Take 2 mg by mouth 4 (four) times daily as needed for diarrhea or loose stools.   Melatonin 10 MG TABS Take 10 mg by mouth at bedtime. (Patient not taking: Reported on 01/18/2020)   Menthol-Zinc Oxide (REMEDY CALAZIME) 0.4-20.5 % PSTE Apply 1 application topically in the morning, at noon, and at bedtime.   OLANZapine (ZYPREXA) 2.5 MG tablet Take 2.5 mg by mouth at bedtime.   pantoprazole (PROTONIX) 40 MG tablet Take 1 tablet (40 mg total) by mouth daily. (Patient not taking: Reported on 01/05/2020)   polyvinyl alcohol (LIQUIFILM TEARS) 1.4 % ophthalmic solution Place 1 drop into both eyes 4 (four) times daily.   potassium chloride SA (KLOR-CON) 20 MEQ tablet Take 40 mEq by mouth 3 (three) times daily. (Patient not taking: Reported on 01/18/2020)   Probiotic Product (RISA-BID PROBIOTIC PO) Take 1 capsule by mouth 2 (two) times daily. (Patient not taking: Reported on 01/05/2020)   Psyllium (METAMUCIL FIBER) 51.7 % PACK Take 1 Package by mouth 2 (two) times daily. (Patient not taking: Reported on 01/05/2020)   rifaximin (XIFAXAN) 200 MG tablet Take 400 mg by mouth 3 (three) times daily.   saccharomyces boulardii (FLORASTOR) 250 MG capsule Take 250 mg by mouth 2 (two) times daily. (Patient not taking: Reported on 01/18/2020)   sertraline (ZOLOFT) 50 MG tablet Take 200 mg by mouth  daily.    simethicone (MYLICON) 80 MG chewable tablet Chew 80 mg by mouth every 6 (six) hours as needed for flatulence.   Skin Protectants, Misc. (CALAZIME SKIN PROTECTANT EX) Apply 1 application topically 2 (two) times daily.    sodium phosphate (FLEET) 7-19 GM/118ML ENEM Place 1 enema rectally once. (Patient not taking: Reported on 01/18/2020)   tamsulosin (FLOMAX) 0.4 MG CAPS capsule Take 0.8 mg by mouth every evening.    traZODone (DESYREL) 50 MG tablet Take 50 mg by mouth at bedtime.    No facility-administered encounter medications on file as of 02/16/2020.    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

## 2020-02-23 DIAGNOSIS — R32 Unspecified urinary incontinence: Secondary | ICD-10-CM | POA: Diagnosis not present

## 2020-02-23 DIAGNOSIS — K567 Ileus, unspecified: Secondary | ICD-10-CM | POA: Diagnosis not present

## 2020-02-23 DIAGNOSIS — F0391 Unspecified dementia with behavioral disturbance: Secondary | ICD-10-CM | POA: Diagnosis not present

## 2020-02-23 DIAGNOSIS — G4700 Insomnia, unspecified: Secondary | ICD-10-CM | POA: Diagnosis not present

## 2020-02-23 DIAGNOSIS — N400 Enlarged prostate without lower urinary tract symptoms: Secondary | ICD-10-CM | POA: Diagnosis not present

## 2020-03-12 ENCOUNTER — Encounter (HOSPITAL_COMMUNITY): Payer: Self-pay

## 2020-03-12 ENCOUNTER — Emergency Department (HOSPITAL_COMMUNITY)
Admission: EM | Admit: 2020-03-12 | Discharge: 2020-03-12 | Disposition: A | Discharge status: Home / Self Care | Payer: Medicare Other | Attending: Emergency Medicine | Admitting: Emergency Medicine

## 2020-03-12 ENCOUNTER — Other Ambulatory Visit: Payer: Self-pay

## 2020-03-12 DIAGNOSIS — Z79899 Other long term (current) drug therapy: Secondary | ICD-10-CM | POA: Insufficient documentation

## 2020-03-12 DIAGNOSIS — F0390 Unspecified dementia without behavioral disturbance: Secondary | ICD-10-CM | POA: Diagnosis not present

## 2020-03-12 DIAGNOSIS — R197 Diarrhea, unspecified: Secondary | ICD-10-CM | POA: Diagnosis not present

## 2020-03-12 DIAGNOSIS — R112 Nausea with vomiting, unspecified: Secondary | ICD-10-CM | POA: Diagnosis not present

## 2020-03-12 DIAGNOSIS — E119 Type 2 diabetes mellitus without complications: Secondary | ICD-10-CM | POA: Insufficient documentation

## 2020-03-12 DIAGNOSIS — Z794 Long term (current) use of insulin: Secondary | ICD-10-CM | POA: Insufficient documentation

## 2020-03-12 DIAGNOSIS — Z87891 Personal history of nicotine dependence: Secondary | ICD-10-CM | POA: Diagnosis not present

## 2020-03-12 DIAGNOSIS — D696 Thrombocytopenia, unspecified: Secondary | ICD-10-CM | POA: Diagnosis not present

## 2020-03-12 DIAGNOSIS — N390 Urinary tract infection, site not specified: Secondary | ICD-10-CM | POA: Diagnosis not present

## 2020-03-12 HISTORY — DX: Enterocolitis due to Clostridium difficile, not specified as recurrent: A04.72

## 2020-03-12 LAB — COMPREHENSIVE METABOLIC PANEL
ALT: 18 U/L (ref 0–44)
AST: 40 U/L (ref 15–41)
Albumin: 3.7 g/dL (ref 3.5–5.0)
Alkaline Phosphatase: 74 U/L (ref 38–126)
Anion gap: 10 (ref 5–15)
BUN: 19 mg/dL (ref 8–23)
CO2: 22 mmol/L (ref 22–32)
Calcium: 8.9 mg/dL (ref 8.9–10.3)
Chloride: 104 mmol/L (ref 98–111)
Creatinine, Ser: 1 mg/dL (ref 0.61–1.24)
GFR calc Af Amer: >60 mL/min (ref >60)
GFR calc non Af Amer: >60 mL/min (ref >60)
Glucose, Bld: 204 mg/dL — ABNORMAL HIGH (ref 70–99)
Sodium: 136 mmol/L (ref 135–145)
Total Bilirubin: 2.5 mg/dL — ABNORMAL HIGH (ref 0.3–1.2)
Total Protein: 7.4 g/dL (ref 6.5–8.1)

## 2020-03-12 LAB — URINALYSIS, ROUTINE W REFLEX MICROSCOPIC
Bilirubin Urine: NEGATIVE
Glucose, UA: NEGATIVE mg/dL
Hgb urine dipstick: NEGATIVE
Ketones, ur: NEGATIVE mg/dL
Nitrite: NEGATIVE
Protein, ur: NEGATIVE mg/dL
Specific Gravity, Urine: 1.013 (ref 1.005–1.030)
pH: 5 (ref 5.0–8.0)

## 2020-03-12 LAB — CBC
HCT: 44.2 % (ref 39.0–52.0)
Hemoglobin: 13.9 g/dL (ref 13.0–17.0)
MCH: 28.4 pg (ref 26.0–34.0)
MCHC: 31.4 g/dL (ref 30.0–36.0)
MCV: 90.2 fL (ref 80.0–100.0)
Platelets: 26 10*3/uL — CL (ref 150–400)
RBC: 4.9 MIL/uL (ref 4.22–5.81)
RDW: 14.9 % (ref 11.5–15.5)
WBC: 7.9 10*3/uL (ref 4.0–10.5)
nRBC: 0 % (ref 0.0–0.2)

## 2020-03-12 MED ORDER — CEFTRIAXONE SODIUM 1 G IJ SOLR
1.0000 g | Freq: Once | INTRAMUSCULAR | Status: AC
  Administered 2020-03-12: 1 g via INTRAMUSCULAR

## 2020-03-12 MED ORDER — SODIUM CHLORIDE 0.9 % IV BOLUS
500.0000 mL | Freq: Once | INTRAVENOUS | Status: AC
  Administered 2020-03-12: 500 mL via INTRAVENOUS

## 2020-03-12 MED ORDER — ONDANSETRON 4 MG PO TBDP: ORAL_TABLET | ORAL | 0 refills | Status: AC

## 2020-03-12 MED ORDER — CEPHALEXIN 500 MG PO CAPS: 500.0000 mg | ORAL_CAPSULE | Freq: Three times a day (TID) | ORAL | 0 refills | Status: AC

## 2020-03-12 NOTE — ED Provider Notes (Signed)
  Physical Exam  BP 131/82   Pulse 80   Temp 98.1 F (36.7 C) (Oral)   Resp 20   SpO2 98%   Physical Exam  ED Course/Procedures     Procedures  MDM  Care assumed at 3:30 pm.  Patient here with nausea vomiting and diarrhea.  Patient has nontender abdomen.  Patient has no complaints.  His platelet count was 26 but has no bleeding.  Signout pending p.o. trial and urinalysis  4:42 PM UA showed UTI.  Given Rocephin.  Tolerated p.o. in the ED.  Stable for discharge with Keflex, zofran prn.    Charlynne Pander, MD 03/12/20 [phone]

## 2020-03-12 NOTE — ED Notes (Signed)
[facility] called  

## 2020-03-12 NOTE — ED Notes (Signed)
Pt tolerated PO fluids as ordered.  °

## 2020-03-12 NOTE — Discharge Instructions (Signed)
Stay hydrated and take Zofran for nausea.  Repeat blood count in a week.  You have a urinary tract infection so please take Keflex 500 mg 3 times daily for a week.  See your doctor for follow-up  Return to ER if you have worse vomiting, abdominal pain, fever.

## 2020-03-12 NOTE — ED Triage Notes (Signed)
Pt BIB EMS from [facility]. Facility reports pt had an episode of V/D this morning. EMS reports pt did not have any N/V/D with them. Pt A&Ox1 per EMS and facility states this is baseline. Pt denies pain. Pt denies nausea. Pt has hx of C Diff.

## 2020-03-12 NOTE — ED Notes (Signed)
Pt pulled off all his VS cords and pulled out his peripheral IV. Pt refusing another IV at this time. Previous IV site is not currently bleeding. Dry, clean, and intact dressing applied.

## 2020-03-12 NOTE — ED Provider Notes (Signed)
[facility]-EMERGENCY DEPT Provider Note   CSN: [id number] Arrival date & time: 03/12/20  1115     History Chief Complaint  Patient presents with  . Emesis  . Diarrhea    Margaretha SeedsGlenn A Sturgill Jr. is a 74 y.o. male.  HPI  Level 295 caveat 74 year old male presents from facility with reports of nausea, vomiting, and diarrhea.  Patient has dementia.  He is unable to give me any additional history.  He does not complain of anything on my evaluation.  Review of records reveal that he is DNR and has dementia.     Past Medical History:  Diagnosis Date  . Anemia   . Arthritis   . Clostridium difficile diarrhea   . Dementia (HCC)   . DM (diabetes mellitus), type 2 with peripheral vascular complications (HCC)   . ETOH abuse   . Myocardial infarction (HCC)   . Peripheral arterial disease (HCC)   . Stroke [facility](HCC)     Patient Active Problem List   Diagnosis Date Noted  . Redness of both eyes 04/13/2019  . Abnormality of eyelid 04/12/2019  . Ileus (HCC) 04/09/2019  . Hypokalemia 04/09/2019  . GERD (gastroesophageal reflux disease) 04/09/2019  . BPH (benign prostatic hyperplasia) 04/09/2019  . Abdominal pain 04/08/2019  . Dementia without behavioral disturbance (HCC) 04/08/2019  . 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 C. difficile colitis 05/07/2018  . Neurocognitive deficits 02/26/2018  . Dysphagia 02/26/2018  . Uncontrolled type 2 diabetes mellitus with hyperglycemia, with long-term current use of insulin (HCC) 12/27/2017  . DKA, type 2 (HCC) 12/26/2017  . 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 Urinary tract infection associated with nephrostomy catheter (HCC) 07/07/2017  . Acute urinary retention 07/07/2017  . Acute metabolic encephalopathy 07/07/2017  . Ureteral calculus, left   . Urinary tract infection due to Klebsiella species   . Candida tropicalis infection 06/20/2017  . Bilateral nephrolithiasis 06/20/2017  .  History of nephrostomy   . Candidemia (HCC) 06/15/2017  . Obstruction of kidney   . 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 ABDOMINAL AORTOGRAM N/A 10/25/2016   Procedure: Abdominal Aortogram;  Surgeon: Sherren KernsFields, Charles E, MD;  [facility];  Service: Cardiovascular;  Laterality: N/A;  . BRAIN SURGERY    . COLONOSCOPY  2015   [place]  . CORONARY STENT PLACEMENT     " about 25 years ago (10/29/16)  . ENDARTERECTOMY FEMORAL Right 10/30/2016   Procedure: Eduardo OsierENDARTERECTOMY FEMORAL-RIGHT WITH PATCH GRAFT;  Surgeon: Sherren KernsFields, Charles E, MD;  [facility];  Service: Vascular;  Laterality: Right;  . IR NEPHRO TUBE REMOV/FL  07/04/2017  . IR NEPHRO TUBE REMOV/FL  07/07/2017  . IR NEPHROSTOMY EXCHANGE LEFT  06/20/2017  . IR NEPHROSTOMY EXCHANGE RIGHT  06/20/2017  . IR NEPHROSTOMY PLACEMENT LEFT  06/12/2017  . IR NEPHROSTOMY PLACEMENT RIGHT  06/12/2017  . IR URETERAL STENT PLACEMENT EXISTING ACCESS LEFT  06/20/2017  . IR URETERAL STENT PLACEMENT EXISTING ACCESS RIGHT  06/20/2017  . LOWER EXTREMITY ANGIOGRAPHY Right 10/25/2016   Procedure: Lower Extremity Angiography;  Surgeon: Sherren KernsFields, Charles E, MD;  [facility];  Service: Cardiovascular;  Laterality: Right;       Family History  Problem Relation Age of Onset  . Diabetes Mother   . Heart disease Father   . Heart  disease Brother     Social History   Tobacco Use  . Smoking status: Former Smoker    Types: Cigarettes  . Smokeless tobacco: Never Used  . Tobacco comment: Quit smoking cigarettes 10/ 2017  Vaping Use  . Vaping Use: Never used  Substance Use Topics  . Alcohol use: No    Comment: Alcohol abuse PMH  . Drug use: No    Home Medications Prior to Admission medications   Medication Sig Start Date End Date Taking? Authorizing Provider   acetaminophen (TYLENOL) 325 MG tablet Take 2 tablets (650 mg total) by mouth every 6 (six) hours as needed for mild pain (or Fever >/= 101). Patient not taking: Reported on 01/05/2020 04/13/19   Roberto Scales D, MD  Baclofen 5 MG TABS Take 5 mg by mouth 3 (three) times daily as needed. 04/13/19   Laverna Peace, MD  bisacodyl (DULCOLAX) 10 MG suppository Place 10 mg rectally once.    [provider]  busPIRone (BUSPAR) 15 MG tablet Take 15 mg by mouth 2 (two) times daily.    [provider]  Cholecalciferol (QC VITAMIN D3) 2000 units TABS Take 2,000 Units by mouth daily.     [provider]  cholestyramine (QUESTRAN) 4 g packet Take 4 g by mouth 4 (four) times daily.    [provider]  famotidine (PEPCID) 20 MG tablet Take 20 mg by mouth daily as needed for heartburn or indigestion.    [provider]  ferrous sulfate 325 (65 FE) MG tablet Take 325 mg by mouth daily.    [provider]  fidaxomicin (DIFICID) 200 MG TABS tablet Take 200 mg by mouth 2 (two) times daily.  Patient not taking: Reported on 01/18/2020 12/04/19   [provider]  fluticasone (FLONASE) 50 MCG/ACT nasal spray Place 2 sprays into both nostrils daily.    [provider]  geriatric multivitamins-minerals (ELDERTONIC/GEVRABON) LIQD Take 15 mLs by mouth 2 (two) times daily.    [provider]  insulin aspart (NOVOLOG) 100 UNIT/ML injection Inject 0-10 Units into the skin in the morning, at noon, in the evening, and at bedtime. Sliding scale : If BS is 1-150 0U 151-200 2U 201-250 4U 251-300 6U 301-350 8U 351-400 10U If greater than 400 call MD    [provider]  insulin glargine (LANTUS) 100 UNIT/ML injection Inject 14-23 Units into the skin See admin instructions. Inject 23 units in the morning and 14 units at bedtime 06/11/16   [provider]  KETOCONAZOLE, TOPICAL, 1 % SHAM Apply 1 application topically 2 (two) times a  week. Monday and thursday Patient not taking: Reported on 01/05/2020    [provider]  loperamide (IMODIUM A-D) 2 MG tablet Take 2 mg by mouth 4 (four) times daily as needed for diarrhea or loose stools.    [provider]  Melatonin 10 MG TABS Take 10 mg by mouth at bedtime. Patient not taking: Reported on 01/18/2020    [provider]  Menthol-Zinc Oxide (REMEDY CALAZIME) 0.4-20.5 % PSTE Apply 1 application topically in the morning, at noon, and at bedtime.    [provider]  OLANZapine (ZYPREXA) 2.5 MG tablet Take 2.5 mg by mouth at bedtime.    [provider]  pantoprazole (PROTONIX) 40 MG tablet Take 1 tablet (40 mg total) by mouth daily. Patient not taking: Reported on 01/05/2020 03/28/19   Cathren Laine, MD  polyvinyl alcohol (LIQUIFILM TEARS) 1.4 % ophthalmic solution Place 1 drop  into both eyes 4 (four) times daily. 04/13/19   Roberto Scales D, MD  potassium chloride SA (KLOR-CON) 20 MEQ tablet Take 40 mEq by mouth 3 (three) times daily. Patient not taking: Reported on 01/18/2020    [provider]  Probiotic Product (RISA-BID PROBIOTIC PO) Take 1 capsule by mouth 2 (two) times daily. Patient not taking: Reported on 01/05/2020    [provider]  Psyllium (METAMUCIL FIBER) 51.7 % PACK Take 1 Package by mouth 2 (two) times daily. Patient not taking: Reported on 01/05/2020    [provider]  rifaximin (XIFAXAN) 200 MG tablet Take 400 mg by mouth 3 (three) times daily.    [provider]  saccharomyces boulardii (FLORASTOR) 250 MG capsule Take 250 mg by mouth 2 (two) times daily. Patient not taking: Reported on 01/18/2020    [provider]  sertraline (ZOLOFT) 50 MG tablet Take 200 mg by mouth daily.     [provider]  simethicone (MYLICON) 80 MG chewable tablet Chew 80 mg by mouth every 6 (six) hours as needed for flatulence.    [provider]  Skin Protectants, Misc. (CALAZIME SKIN  PROTECTANT EX) Apply 1 application topically 2 (two) times daily.     [provider]  sodium phosphate (FLEET) 7-19 GM/118ML ENEM Place 1 enema rectally once. Patient not taking: Reported on 01/18/2020    [provider]  tamsulosin (FLOMAX) 0.4 MG CAPS capsule Take 0.8 mg by mouth every evening.     [provider]  traZODone (DESYREL) 50 MG tablet Take 50 mg by mouth at bedtime.  09/11/16   [provider]    Allergies    Penicillins  Review of Systems   Review of Systems  Unable to perform ROS: Dementia    Physical Exam Updated Vital Signs BP 117/72   Pulse 79   Temp 98.1 F (36.7 C) (Oral)   Resp 18   SpO2 96%   Physical Exam Vitals and nursing note reviewed.  Constitutional:      Appearance: Normal appearance.  HENT:     Head: Normocephalic.     Right Ear: External ear normal.     Left Ear: External ear normal.     Nose: Nose normal.     Mouth/Throat:     Mouth: Mucous membranes are moist.  Eyes:     Pupils: Pupils are equal, round, and reactive to light.  Cardiovascular:     Rate and Rhythm: Normal rate and regular rhythm.     Pulses: Normal pulses.  Pulmonary:     Effort: Pulmonary effort is normal.     Breath sounds: Normal breath sounds.  Abdominal:     General: Abdomen is flat. Bowel sounds are normal.     Palpations: Abdomen is soft.  Musculoskeletal:     Cervical back: Normal range of motion.     Comments: Left BKA right partial foot amputation   Skin:    General: Skin is warm.     Capillary Refill: Capillary refill takes less than 2 seconds.     Comments: Some early skin breakdown with redness on the perirectal area  Neurological:     General: No focal deficit present.     Mental Status: He is alert.     Comments: Patient awake and alert oriented to name but not to place or date     ED Results / Procedures / Treatments   Labs (all labs ordered are listed, but only abnormal results are  displayed) Labs  Reviewed - No data to display  EKG None  Radiology No results found.  Procedures Procedures (including critical care time)  Medications Ordered in ED Medications - No data to display  ED Course  I have reviewed the triage vital signs and the nursing notes.  Pertinent labs & imaging results that were available during my care of the patient were reviewed by me and considered in my medical decision making (see chart for details).    MDM Rules/Calculators/A&P                           1- n/v/d- patient asymptomatic here.  IV fluids and antiemetics ordered Po fluid challenge pending 2- thrombocytopenia- new finding of low platelets at 26,000.  No bleeding noted Plan op follow up 3- uti- many bacteria, leukocyte- pending ua.  Plan rocephin im 4- patient on palliative care- appears stable to be d/c'd back to facility  If tolerating po Discussed with DR. Yao in sign out and care assumed  Final Clinical Impression(s) / ED Diagnoses Final diagnoses:  Urinary tract infection without hematuria, site unspecified  Thrombocytopenia (HCC)  Non-intractable vomiting with nausea, unspecified vomiting type    Rx / DC Orders ED Discharge Orders    None       Margarita Grizzle, MD 03/12/20 1554

## 2020-03-12 NOTE — ED Notes (Signed)
PTAR bedside 

## 2020-03-13 DIAGNOSIS — K567 Ileus, unspecified: Secondary | ICD-10-CM | POA: Diagnosis not present

## 2020-03-13 DIAGNOSIS — R112 Nausea with vomiting, unspecified: Secondary | ICD-10-CM | POA: Diagnosis not present

## 2020-03-13 DIAGNOSIS — N390 Urinary tract infection, site not specified: Secondary | ICD-10-CM | POA: Diagnosis not present

## 2020-03-13 DIAGNOSIS — R32 Unspecified urinary incontinence: Secondary | ICD-10-CM | POA: Diagnosis not present

## 2020-03-13 DIAGNOSIS — L258 Unspecified contact dermatitis due to other agents: Secondary | ICD-10-CM | POA: Diagnosis not present

## 2020-03-15 DIAGNOSIS — M810 Age-related osteoporosis without current pathological fracture: Secondary | ICD-10-CM | POA: Diagnosis not present

## 2020-03-15 DIAGNOSIS — F0391 Unspecified dementia with behavioral disturbance: Secondary | ICD-10-CM | POA: Diagnosis not present

## 2020-03-15 DIAGNOSIS — F0390 Unspecified dementia without behavioral disturbance: Secondary | ICD-10-CM | POA: Diagnosis not present

## 2020-03-15 DIAGNOSIS — F329 Major depressive disorder, single episode, unspecified: Secondary | ICD-10-CM | POA: Diagnosis not present

## 2020-03-15 DIAGNOSIS — E1140 Type 2 diabetes mellitus with diabetic neuropathy, unspecified: Secondary | ICD-10-CM | POA: Diagnosis not present

## 2020-03-15 DIAGNOSIS — E1142 Type 2 diabetes mellitus with diabetic polyneuropathy: Secondary | ICD-10-CM | POA: Diagnosis not present

## 2020-03-15 DIAGNOSIS — N400 Enlarged prostate without lower urinary tract symptoms: Secondary | ICD-10-CM | POA: Diagnosis not present

## 2020-03-15 DIAGNOSIS — E785 Hyperlipidemia, unspecified: Secondary | ICD-10-CM | POA: Diagnosis not present

## 2020-03-15 DIAGNOSIS — E559 Vitamin D deficiency, unspecified: Secondary | ICD-10-CM | POA: Diagnosis not present

## 2020-03-15 DIAGNOSIS — D508 Other iron deficiency anemias: Secondary | ICD-10-CM | POA: Diagnosis not present

## 2020-03-16 DIAGNOSIS — D696 Thrombocytopenia, unspecified: Secondary | ICD-10-CM | POA: Diagnosis not present

## 2020-03-16 DIAGNOSIS — N390 Urinary tract infection, site not specified: Secondary | ICD-10-CM | POA: Diagnosis not present

## 2020-03-16 DIAGNOSIS — Z8719 Personal history of other diseases of the digestive system: Secondary | ICD-10-CM | POA: Diagnosis not present

## 2020-03-16 DIAGNOSIS — R197 Diarrhea, unspecified: Secondary | ICD-10-CM | POA: Diagnosis not present

## 2020-03-20 DIAGNOSIS — N390 Urinary tract infection, site not specified: Secondary | ICD-10-CM | POA: Diagnosis not present

## 2020-03-20 DIAGNOSIS — K567 Ileus, unspecified: Secondary | ICD-10-CM | POA: Diagnosis not present

## 2020-03-20 DIAGNOSIS — R112 Nausea with vomiting, unspecified: Secondary | ICD-10-CM | POA: Diagnosis not present

## 2020-03-20 DIAGNOSIS — E1142 Type 2 diabetes mellitus with diabetic polyneuropathy: Secondary | ICD-10-CM | POA: Diagnosis not present

## 2020-03-20 DIAGNOSIS — A0472 Enterocolitis due to Clostridium difficile, not specified as recurrent: Secondary | ICD-10-CM | POA: Diagnosis not present

## 2020-03-21 DIAGNOSIS — G4700 Insomnia, unspecified: Secondary | ICD-10-CM | POA: Diagnosis not present

## 2020-03-21 DIAGNOSIS — F329 Major depressive disorder, single episode, unspecified: Secondary | ICD-10-CM | POA: Diagnosis not present

## 2020-03-21 DIAGNOSIS — K30 Functional dyspepsia: Secondary | ICD-10-CM | POA: Diagnosis not present

## 2020-03-21 DIAGNOSIS — F0391 Unspecified dementia with behavioral disturbance: Secondary | ICD-10-CM | POA: Diagnosis not present

## 2020-03-21 DIAGNOSIS — F419 Anxiety disorder, unspecified: Secondary | ICD-10-CM | POA: Diagnosis not present

## 2020-03-23 DIAGNOSIS — G4700 Insomnia, unspecified: Secondary | ICD-10-CM | POA: Diagnosis not present

## 2020-03-23 DIAGNOSIS — F329 Major depressive disorder, single episode, unspecified: Secondary | ICD-10-CM | POA: Diagnosis not present

## 2020-03-23 DIAGNOSIS — F419 Anxiety disorder, unspecified: Secondary | ICD-10-CM | POA: Diagnosis not present

## 2020-03-23 DIAGNOSIS — E1142 Type 2 diabetes mellitus with diabetic polyneuropathy: Secondary | ICD-10-CM | POA: Diagnosis not present

## 2020-03-29 DIAGNOSIS — K567 Ileus, unspecified: Secondary | ICD-10-CM | POA: Diagnosis not present

## 2020-03-29 DIAGNOSIS — R32 Unspecified urinary incontinence: Secondary | ICD-10-CM | POA: Diagnosis not present

## 2020-03-29 DIAGNOSIS — G4700 Insomnia, unspecified: Secondary | ICD-10-CM | POA: Diagnosis not present

## 2020-03-29 DIAGNOSIS — L258 Unspecified contact dermatitis due to other agents: Secondary | ICD-10-CM | POA: Diagnosis not present

## 2020-04-11 DIAGNOSIS — N400 Enlarged prostate without lower urinary tract symptoms: Secondary | ICD-10-CM | POA: Diagnosis not present

## 2020-04-11 DIAGNOSIS — D508 Other iron deficiency anemias: Secondary | ICD-10-CM | POA: Diagnosis not present

## 2020-04-11 DIAGNOSIS — M810 Age-related osteoporosis without current pathological fracture: Secondary | ICD-10-CM | POA: Diagnosis not present

## 2020-04-11 DIAGNOSIS — F0390 Unspecified dementia without behavioral disturbance: Secondary | ICD-10-CM | POA: Diagnosis not present

## 2020-04-11 DIAGNOSIS — E785 Hyperlipidemia, unspecified: Secondary | ICD-10-CM | POA: Diagnosis not present

## 2020-04-11 DIAGNOSIS — F0391 Unspecified dementia with behavioral disturbance: Secondary | ICD-10-CM | POA: Diagnosis not present

## 2020-04-11 DIAGNOSIS — E1142 Type 2 diabetes mellitus with diabetic polyneuropathy: Secondary | ICD-10-CM | POA: Diagnosis not present

## 2020-04-11 DIAGNOSIS — F329 Major depressive disorder, single episode, unspecified: Secondary | ICD-10-CM | POA: Diagnosis not present

## 2020-04-11 DIAGNOSIS — E1140 Type 2 diabetes mellitus with diabetic neuropathy, unspecified: Secondary | ICD-10-CM | POA: Diagnosis not present

## 2020-04-11 DIAGNOSIS — E559 Vitamin D deficiency, unspecified: Secondary | ICD-10-CM | POA: Diagnosis not present

## 2020-04-13 DIAGNOSIS — E785 Hyperlipidemia, unspecified: Secondary | ICD-10-CM | POA: Diagnosis not present

## 2020-04-24 DIAGNOSIS — A0471 Enterocolitis due to Clostridium difficile, recurrent: Secondary | ICD-10-CM | POA: Diagnosis not present

## 2020-05-01 DIAGNOSIS — N400 Enlarged prostate without lower urinary tract symptoms: Secondary | ICD-10-CM | POA: Diagnosis not present

## 2020-05-01 DIAGNOSIS — G4700 Insomnia, unspecified: Secondary | ICD-10-CM | POA: Diagnosis not present

## 2020-05-01 DIAGNOSIS — R32 Unspecified urinary incontinence: Secondary | ICD-10-CM | POA: Diagnosis not present

## 2020-05-01 DIAGNOSIS — K567 Ileus, unspecified: Secondary | ICD-10-CM | POA: Diagnosis not present

## 2020-05-01 DIAGNOSIS — F0391 Unspecified dementia with behavioral disturbance: Secondary | ICD-10-CM | POA: Diagnosis not present

## 2020-05-03 DIAGNOSIS — R32 Unspecified urinary incontinence: Secondary | ICD-10-CM | POA: Diagnosis not present

## 2020-05-03 DIAGNOSIS — R059 Cough, unspecified: Secondary | ICD-10-CM | POA: Diagnosis not present

## 2020-05-03 DIAGNOSIS — D508 Other iron deficiency anemias: Secondary | ICD-10-CM | POA: Diagnosis not present

## 2020-05-03 DIAGNOSIS — L258 Unspecified contact dermatitis due to other agents: Secondary | ICD-10-CM | POA: Diagnosis not present

## 2020-05-15 DIAGNOSIS — L258 Unspecified contact dermatitis due to other agents: Secondary | ICD-10-CM | POA: Diagnosis not present

## 2020-05-15 DIAGNOSIS — N400 Enlarged prostate without lower urinary tract symptoms: Secondary | ICD-10-CM | POA: Diagnosis not present

## 2020-05-15 DIAGNOSIS — R059 Cough, unspecified: Secondary | ICD-10-CM | POA: Diagnosis not present

## 2020-05-15 DIAGNOSIS — E1142 Type 2 diabetes mellitus with diabetic polyneuropathy: Secondary | ICD-10-CM | POA: Diagnosis not present

## 2020-05-30 DIAGNOSIS — D508 Other iron deficiency anemias: Secondary | ICD-10-CM | POA: Diagnosis not present

## 2020-05-30 DIAGNOSIS — E785 Hyperlipidemia, unspecified: Secondary | ICD-10-CM | POA: Diagnosis not present

## 2020-05-30 DIAGNOSIS — F0391 Unspecified dementia with behavioral disturbance: Secondary | ICD-10-CM | POA: Diagnosis not present

## 2020-05-30 DIAGNOSIS — M810 Age-related osteoporosis without current pathological fracture: Secondary | ICD-10-CM | POA: Diagnosis not present

## 2020-05-30 DIAGNOSIS — E559 Vitamin D deficiency, unspecified: Secondary | ICD-10-CM | POA: Diagnosis not present

## 2020-05-30 DIAGNOSIS — E1142 Type 2 diabetes mellitus with diabetic polyneuropathy: Secondary | ICD-10-CM | POA: Diagnosis not present

## 2020-05-30 DIAGNOSIS — N400 Enlarged prostate without lower urinary tract symptoms: Secondary | ICD-10-CM | POA: Diagnosis not present

## 2020-05-30 DIAGNOSIS — F329 Major depressive disorder, single episode, unspecified: Secondary | ICD-10-CM | POA: Diagnosis not present

## 2020-05-30 DIAGNOSIS — F0390 Unspecified dementia without behavioral disturbance: Secondary | ICD-10-CM | POA: Diagnosis not present

## 2020-05-30 DIAGNOSIS — E1140 Type 2 diabetes mellitus with diabetic neuropathy, unspecified: Secondary | ICD-10-CM | POA: Diagnosis not present

## 2020-06-05 DIAGNOSIS — D508 Other iron deficiency anemias: Secondary | ICD-10-CM | POA: Diagnosis not present

## 2020-06-05 DIAGNOSIS — K567 Ileus, unspecified: Secondary | ICD-10-CM | POA: Diagnosis not present

## 2020-06-05 DIAGNOSIS — R32 Unspecified urinary incontinence: Secondary | ICD-10-CM | POA: Diagnosis not present

## 2020-06-05 DIAGNOSIS — R059 Cough, unspecified: Secondary | ICD-10-CM | POA: Diagnosis not present

## 2020-06-05 DIAGNOSIS — E1142 Type 2 diabetes mellitus with diabetic polyneuropathy: Secondary | ICD-10-CM | POA: Diagnosis not present

## 2020-06-05 DIAGNOSIS — N400 Enlarged prostate without lower urinary tract symptoms: Secondary | ICD-10-CM | POA: Diagnosis not present

## 2020-06-05 DIAGNOSIS — F0391 Unspecified dementia with behavioral disturbance: Secondary | ICD-10-CM | POA: Diagnosis not present

## 2020-06-05 DIAGNOSIS — K219 Gastro-esophageal reflux disease without esophagitis: Secondary | ICD-10-CM | POA: Diagnosis not present

## 2020-06-05 DIAGNOSIS — G4700 Insomnia, unspecified: Secondary | ICD-10-CM | POA: Diagnosis not present

## 2020-06-05 DIAGNOSIS — L258 Unspecified contact dermatitis due to other agents: Secondary | ICD-10-CM | POA: Diagnosis not present

## 2020-06-10 DIAGNOSIS — F419 Anxiety disorder, unspecified: Secondary | ICD-10-CM | POA: Diagnosis not present

## 2020-06-10 DIAGNOSIS — E119 Type 2 diabetes mellitus without complications: Secondary | ICD-10-CM | POA: Diagnosis not present

## 2020-06-15 DIAGNOSIS — R32 Unspecified urinary incontinence: Secondary | ICD-10-CM | POA: Diagnosis not present

## 2020-06-15 DIAGNOSIS — D508 Other iron deficiency anemias: Secondary | ICD-10-CM | POA: Diagnosis not present

## 2020-06-15 DIAGNOSIS — L258 Unspecified contact dermatitis due to other agents: Secondary | ICD-10-CM | POA: Diagnosis not present

## 2020-06-15 DIAGNOSIS — N400 Enlarged prostate without lower urinary tract symptoms: Secondary | ICD-10-CM | POA: Diagnosis not present

## 2020-06-15 DIAGNOSIS — E1142 Type 2 diabetes mellitus with diabetic polyneuropathy: Secondary | ICD-10-CM | POA: Diagnosis not present

## 2020-06-15 DIAGNOSIS — J302 Other seasonal allergic rhinitis: Secondary | ICD-10-CM | POA: Diagnosis not present

## 2020-06-15 DIAGNOSIS — G4700 Insomnia, unspecified: Secondary | ICD-10-CM | POA: Diagnosis not present

## 2020-06-15 DIAGNOSIS — R059 Cough, unspecified: Secondary | ICD-10-CM | POA: Diagnosis not present
